# Patient Record
Sex: Male | Born: 1965
Health system: Southern US, Community
[De-identification: ages and names within clinical notes are randomized; demographics above are authoritative.]

## PROBLEM LIST (undated history)

## (undated) DIAGNOSIS — R1310 Dysphagia, unspecified: Secondary | ICD-10-CM

## (undated) DIAGNOSIS — F419 Anxiety disorder, unspecified: Secondary | ICD-10-CM

## (undated) DIAGNOSIS — K219 Gastro-esophageal reflux disease without esophagitis: Secondary | ICD-10-CM

## (undated) DIAGNOSIS — M549 Dorsalgia, unspecified: Secondary | ICD-10-CM

## (undated) DIAGNOSIS — G8929 Other chronic pain: Secondary | ICD-10-CM

## (undated) DIAGNOSIS — I10 Essential (primary) hypertension: Secondary | ICD-10-CM

## (undated) DIAGNOSIS — IMO0002 Reserved for concepts with insufficient information to code with codable children: Secondary | ICD-10-CM

## (undated) DIAGNOSIS — M542 Cervicalgia: Secondary | ICD-10-CM

## (undated) DIAGNOSIS — J45909 Unspecified asthma, uncomplicated: Secondary | ICD-10-CM

## (undated) DIAGNOSIS — M1990 Unspecified osteoarthritis, unspecified site: Secondary | ICD-10-CM

## (undated) DIAGNOSIS — E349 Endocrine disorder, unspecified: Secondary | ICD-10-CM

## (undated) DIAGNOSIS — D751 Secondary polycythemia: Secondary | ICD-10-CM

## (undated) HISTORY — PX: WRIST SURGERY: SHX841

## (undated) HISTORY — DX: Endocrine disorder, unspecified: E34.9

## (undated) HISTORY — PX: CERVICAL FUSION: SHX112

## (undated) HISTORY — DX: Secondary polycythemia: D75.1

## (undated) HISTORY — PX: TONSILLECTOMY: SUR1361

## (undated) HISTORY — PX: BACK SURGERY: SHX140

## (undated) HISTORY — PX: KNEE SURGERY: SHX244

## (undated) HISTORY — PX: ANKLE SURGERY: SHX546

## (undated) HISTORY — PX: HERNIA REPAIR: SHX51

---

## 1997-04-08 HISTORY — PX: SHOULDER SURGERY: SHX246

## 2000-08-20 ENCOUNTER — Encounter: Payer: Self-pay | Admitting: Emergency Medicine

## 2000-08-20 ENCOUNTER — Emergency Department (HOSPITAL_COMMUNITY): Admission: EM | Admit: 2000-08-20 | Discharge: 2000-08-20 | Payer: Self-pay | Admitting: Emergency Medicine

## 2000-11-17 ENCOUNTER — Encounter: Admission: RE | Admit: 2000-11-17 | Discharge: 2000-11-17 | Payer: Self-pay | Admitting: *Deleted

## 2002-05-05 ENCOUNTER — Encounter: Payer: Self-pay | Admitting: Emergency Medicine

## 2002-05-05 ENCOUNTER — Emergency Department (HOSPITAL_COMMUNITY): Admission: EM | Admit: 2002-05-05 | Discharge: 2002-05-05 | Payer: Self-pay | Admitting: Emergency Medicine

## 2003-08-30 ENCOUNTER — Ambulatory Visit (HOSPITAL_COMMUNITY): Admission: RE | Admit: 2003-08-30 | Discharge: 2003-08-30 | Payer: Self-pay | Admitting: *Deleted

## 2004-01-22 ENCOUNTER — Emergency Department (HOSPITAL_COMMUNITY): Admission: EM | Admit: 2004-01-22 | Discharge: 2004-01-22 | Payer: Self-pay | Admitting: Internal Medicine

## 2004-06-01 ENCOUNTER — Emergency Department (HOSPITAL_COMMUNITY): Admission: EM | Admit: 2004-06-01 | Discharge: 2004-06-01 | Payer: Self-pay | Admitting: *Deleted

## 2004-06-05 ENCOUNTER — Emergency Department (HOSPITAL_COMMUNITY): Admission: EM | Admit: 2004-06-05 | Discharge: 2004-06-05 | Payer: Self-pay | Admitting: Family Medicine

## 2004-06-05 ENCOUNTER — Ambulatory Visit (HOSPITAL_COMMUNITY): Admission: RE | Admit: 2004-06-05 | Discharge: 2004-06-05 | Payer: Self-pay | Admitting: Family Medicine

## 2005-03-18 ENCOUNTER — Ambulatory Visit (HOSPITAL_COMMUNITY)
Admission: RE | Admit: 2005-03-18 | Discharge: 2005-03-18 | Payer: Self-pay | Admitting: Physical Medicine & Rehabilitation

## 2005-08-15 ENCOUNTER — Ambulatory Visit (HOSPITAL_COMMUNITY): Admission: RE | Admit: 2005-08-15 | Discharge: 2005-08-16 | Payer: Self-pay | Admitting: Specialist

## 2005-11-06 ENCOUNTER — Ambulatory Visit: Admission: RE | Admit: 2005-11-06 | Discharge: 2005-11-06 | Payer: Self-pay | Admitting: Specialist

## 2005-11-12 ENCOUNTER — Inpatient Hospital Stay (HOSPITAL_COMMUNITY): Admission: RE | Admit: 2005-11-12 | Discharge: 2005-11-14 | Payer: Self-pay | Admitting: Specialist

## 2005-12-07 ENCOUNTER — Emergency Department (HOSPITAL_COMMUNITY): Admission: EM | Admit: 2005-12-07 | Discharge: 2005-12-07 | Payer: Self-pay | Admitting: Emergency Medicine

## 2005-12-11 ENCOUNTER — Observation Stay (HOSPITAL_COMMUNITY): Admission: EM | Admit: 2005-12-11 | Discharge: 2005-12-11 | Payer: Self-pay | Admitting: Emergency Medicine

## 2005-12-27 ENCOUNTER — Ambulatory Visit (HOSPITAL_COMMUNITY): Admission: RE | Admit: 2005-12-27 | Discharge: 2005-12-27 | Payer: Self-pay | Admitting: Specialist

## 2006-04-04 ENCOUNTER — Encounter: Admission: RE | Admit: 2006-04-04 | Discharge: 2006-04-04 | Payer: Self-pay | Admitting: Specialist

## 2006-05-16 ENCOUNTER — Encounter: Admission: RE | Admit: 2006-05-16 | Discharge: 2006-05-16 | Payer: Self-pay | Admitting: Otolaryngology

## 2006-05-20 ENCOUNTER — Ambulatory Visit (HOSPITAL_COMMUNITY): Admission: RE | Admit: 2006-05-20 | Discharge: 2006-05-21 | Payer: Self-pay | Admitting: Specialist

## 2006-07-07 ENCOUNTER — Ambulatory Visit (HOSPITAL_BASED_OUTPATIENT_CLINIC_OR_DEPARTMENT_OTHER): Admission: RE | Admit: 2006-07-07 | Discharge: 2006-07-07 | Payer: Self-pay | Admitting: Otolaryngology

## 2006-07-13 ENCOUNTER — Ambulatory Visit: Payer: Self-pay | Admitting: Internal Medicine

## 2007-09-09 ENCOUNTER — Encounter: Admission: RE | Admit: 2007-09-09 | Discharge: 2007-09-09 | Payer: Self-pay | Admitting: Specialist

## 2008-03-04 ENCOUNTER — Emergency Department (HOSPITAL_COMMUNITY): Admission: EM | Admit: 2008-03-04 | Discharge: 2008-03-04 | Payer: Self-pay | Admitting: Emergency Medicine

## 2008-05-15 ENCOUNTER — Emergency Department (HOSPITAL_COMMUNITY): Admission: EM | Admit: 2008-05-15 | Discharge: 2008-05-15 | Payer: Self-pay | Admitting: Emergency Medicine

## 2008-05-31 ENCOUNTER — Encounter: Admission: RE | Admit: 2008-05-31 | Discharge: 2008-05-31 | Payer: Self-pay | Admitting: Specialist

## 2008-06-30 ENCOUNTER — Inpatient Hospital Stay (HOSPITAL_COMMUNITY): Admission: RE | Admit: 2008-06-30 | Discharge: 2008-07-03 | Payer: Self-pay | Admitting: Specialist

## 2008-09-14 ENCOUNTER — Encounter: Admission: RE | Admit: 2008-09-14 | Discharge: 2008-09-14 | Payer: Self-pay | Admitting: Specialist

## 2008-09-22 ENCOUNTER — Encounter: Admission: RE | Admit: 2008-09-22 | Discharge: 2008-09-22 | Payer: Self-pay | Admitting: Specialist

## 2008-10-05 ENCOUNTER — Encounter: Admission: RE | Admit: 2008-10-05 | Discharge: 2008-10-05 | Payer: Self-pay | Admitting: Specialist

## 2008-10-18 ENCOUNTER — Emergency Department (HOSPITAL_COMMUNITY): Admission: EM | Admit: 2008-10-18 | Discharge: 2008-10-18 | Payer: Self-pay | Admitting: Family Medicine

## 2008-10-19 ENCOUNTER — Inpatient Hospital Stay (HOSPITAL_COMMUNITY): Admission: EM | Admit: 2008-10-19 | Discharge: 2008-10-19 | Payer: Self-pay | Admitting: Emergency Medicine

## 2009-01-12 ENCOUNTER — Encounter: Admission: RE | Admit: 2009-01-12 | Discharge: 2009-01-12 | Payer: Self-pay | Admitting: Specialist

## 2009-04-21 ENCOUNTER — Encounter: Admission: RE | Admit: 2009-04-21 | Discharge: 2009-04-21 | Payer: Self-pay | Admitting: Specialist

## 2009-06-14 ENCOUNTER — Encounter: Admission: RE | Admit: 2009-06-14 | Discharge: 2009-06-14 | Payer: Self-pay | Admitting: Specialist

## 2009-07-06 ENCOUNTER — Inpatient Hospital Stay (HOSPITAL_COMMUNITY): Admission: RE | Admit: 2009-07-06 | Discharge: 2009-07-09 | Payer: Self-pay | Admitting: Specialist

## 2009-09-29 ENCOUNTER — Encounter: Admission: RE | Admit: 2009-09-29 | Discharge: 2009-09-29 | Payer: Self-pay | Admitting: Family Medicine

## 2009-12-07 ENCOUNTER — Encounter: Admission: RE | Admit: 2009-12-07 | Discharge: 2009-12-07 | Payer: Self-pay | Admitting: Specialist

## 2010-03-12 ENCOUNTER — Encounter: Admission: RE | Admit: 2010-03-12 | Discharge: 2010-03-12 | Payer: Self-pay | Admitting: Specialist

## 2010-05-30 ENCOUNTER — Other Ambulatory Visit: Payer: Self-pay | Admitting: Anesthesiology

## 2010-05-30 DIAGNOSIS — M542 Cervicalgia: Secondary | ICD-10-CM

## 2010-06-05 ENCOUNTER — Ambulatory Visit
Admission: RE | Admit: 2010-06-05 | Discharge: 2010-06-05 | Disposition: A | Discharge status: Home / Self Care | Payer: BC Managed Care – PPO | Source: Ambulatory Visit | Attending: Anesthesiology | Admitting: Anesthesiology

## 2010-06-05 DIAGNOSIS — M542 Cervicalgia: Secondary | ICD-10-CM

## 2010-06-27 LAB — BASIC METABOLIC PANEL
BUN: 12 mg/dL (ref 6–23)
Creatinine, Ser: 0.88 mg/dL (ref 0.4–1.5)
GFR calc non Af Amer: >60 mL/min (ref >60)

## 2010-06-27 LAB — HEMOGLOBIN AND HEMATOCRIT, BLOOD: Hemoglobin: 13.6 g/dL (ref 13.0–17.0)

## 2010-06-29 LAB — CBC
Hemoglobin: 16.6 g/dL (ref 13.0–17.0)
MCHC: 33.8 g/dL (ref 30.0–36.0)
MCV: 89 fL (ref 78.0–100.0)
RBC: 5.52 MIL/uL (ref 4.22–5.81)
WBC: 10.3 10*3/uL (ref 4.0–10.5)

## 2010-06-29 LAB — PROTIME-INR
INR: 0.97 (ref 0.00–1.49)
Prothrombin Time: 12.8 seconds (ref 11.6–15.2)

## 2010-06-29 LAB — URINALYSIS, ROUTINE W REFLEX MICROSCOPIC
Bilirubin Urine: NEGATIVE
Glucose, UA: NEGATIVE mg/dL
Hgb urine dipstick: NEGATIVE
Ketones, ur: NEGATIVE mg/dL
Protein, ur: NEGATIVE mg/dL
Urobilinogen, UA: 1 mg/dL (ref 0.0–1.0)

## 2010-06-29 LAB — DIFFERENTIAL
Basophils Absolute: 0 10*3/uL (ref 0.0–0.1)
Basophils Relative: 0 % (ref 0–1)
Eosinophils Absolute: 0.2 10*3/uL (ref 0.0–0.7)
Eosinophils Relative: 2 % (ref 0–5)
Lymphs Abs: 2.4 10*3/uL (ref 0.7–4.0)
Neutrophils Relative %: 66 % (ref 43–77)

## 2010-06-29 LAB — COMPREHENSIVE METABOLIC PANEL
ALT: 28 U/L (ref 0–53)
CO2: 30 mEq/L (ref 19–32)
Calcium: 9.6 mg/dL (ref 8.4–10.5)
Chloride: 105 mEq/L (ref 96–112)
Creatinine, Ser: 0.82 mg/dL (ref 0.4–1.5)
GFR calc non Af Amer: >60 mL/min (ref >60)
Glucose, Bld: 94 mg/dL (ref 70–99)
Sodium: 140 mEq/L (ref 135–145)
Total Bilirubin: 0.7 mg/dL (ref 0.3–1.2)

## 2010-06-29 LAB — POCT I-STAT 7, (LYTES, BLD GAS, ICA,H+H)
Bicarbonate: 30.3 mEq/L — ABNORMAL HIGH (ref 20.0–24.0)
Hemoglobin: 11.9 g/dL — ABNORMAL LOW (ref 13.0–17.0)
O2 Saturation: 100 %
Potassium: 3.8 mEq/L (ref 3.5–5.1)
TCO2: 32 mmol/L (ref 0–100)
pCO2 arterial: 44.1 mmHg (ref 35.0–45.0)
pH, Arterial: 7.445 (ref 7.350–7.450)
pO2, Arterial: 532 mmHg — ABNORMAL HIGH (ref 80.0–100.0)

## 2010-06-29 LAB — HEMOGLOBIN AND HEMATOCRIT, BLOOD: Hemoglobin: 13.5 g/dL (ref 13.0–17.0)

## 2010-07-09 ENCOUNTER — Other Ambulatory Visit: Payer: Self-pay | Admitting: Specialist

## 2010-07-09 DIAGNOSIS — M25562 Pain in left knee: Secondary | ICD-10-CM

## 2010-07-15 LAB — CBC
Hemoglobin: 15.8 g/dL (ref 13.0–17.0)
MCHC: 32.9 g/dL (ref 30.0–36.0)
MCHC: 34.6 g/dL (ref 30.0–36.0)
MCV: 88.8 fL (ref 78.0–100.0)
Platelets: 190 10*3/uL (ref 150–400)
RBC: 5.39 MIL/uL (ref 4.22–5.81)
RDW: 13.8 % (ref 11.5–15.5)

## 2010-07-15 LAB — CK TOTAL AND CKMB (NOT AT ARMC)
CK, MB: 1.5 ng/mL (ref 0.3–4.0)
Relative Index: INVALID (ref 0.0–2.5)
Total CK: 99 U/L (ref 7–232)

## 2010-07-15 LAB — D-DIMER, QUANTITATIVE: D-Dimer, Quant: 0.25 ug/mL-FEU (ref 0.00–0.48)

## 2010-07-15 LAB — CARDIAC PANEL(CRET KIN+CKTOT+MB+TROPI)
CK, MB: 1.6 ng/mL (ref 0.3–4.0)
Relative Index: INVALID (ref 0.0–2.5)
Relative Index: INVALID (ref 0.0–2.5)
Troponin I: 0.01 ng/mL (ref 0.00–0.06)

## 2010-07-15 LAB — DIFFERENTIAL
Basophils Absolute: 0 10*3/uL (ref 0.0–0.1)
Basophils Relative: 0 % (ref 0–1)
Eosinophils Relative: 2 % (ref 0–5)
Monocytes Absolute: 0.8 10*3/uL (ref 0.1–1.0)
Neutro Abs: 7.5 10*3/uL (ref 1.7–7.7)

## 2010-07-15 LAB — COMPREHENSIVE METABOLIC PANEL
AST: 23 U/L (ref 0–37)
Albumin: 3.7 g/dL (ref 3.5–5.2)
Calcium: 9.4 mg/dL (ref 8.4–10.5)
Creatinine, Ser: 1.01 mg/dL (ref 0.4–1.5)
GFR calc Af Amer: >60 mL/min (ref >60)
GFR calc non Af Amer: >60 mL/min (ref >60)

## 2010-07-15 LAB — HEMOGLOBIN A1C: Mean Plasma Glucose: 82 mg/dL

## 2010-07-15 LAB — BASIC METABOLIC PANEL
CO2: 28 mEq/L (ref 19–32)
Calcium: 8.8 mg/dL (ref 8.4–10.5)
Creatinine, Ser: 0.93 mg/dL (ref 0.4–1.5)
GFR calc Af Amer: >60 mL/min (ref >60)
GFR calc non Af Amer: >60 mL/min (ref >60)
Sodium: 139 mEq/L (ref 135–145)

## 2010-07-15 LAB — LIPID PANEL
Cholesterol: 180 mg/dL (ref 0–200)
HDL: 44 mg/dL (ref >39)

## 2010-07-15 LAB — TSH: TSH: 2.108 u[IU]/mL (ref 0.350–4.500)

## 2010-07-15 LAB — TROPONIN I: Troponin I: 0.01 ng/mL (ref 0.00–0.06)

## 2010-07-16 ENCOUNTER — Other Ambulatory Visit: Payer: BC Managed Care – PPO

## 2010-07-19 LAB — BASIC METABOLIC PANEL
BUN: 16 mg/dL (ref 6–23)
Calcium: 8.8 mg/dL (ref 8.4–10.5)
Creatinine, Ser: 1.04 mg/dL (ref 0.4–1.5)
GFR calc non Af Amer: >60 mL/min (ref >60)
Glucose, Bld: 125 mg/dL — ABNORMAL HIGH (ref 70–99)
Sodium: 139 mEq/L (ref 135–145)

## 2010-07-19 LAB — TYPE AND SCREEN: ABO/RH(D): O NEG

## 2010-07-19 LAB — CBC
HCT: 47.7 % (ref 39.0–52.0)
Hemoglobin: 14.5 g/dL (ref 13.0–17.0)
MCHC: 33.9 g/dL (ref 30.0–36.0)
Platelets: 229 10*3/uL (ref 150–400)
RDW: 13.3 % (ref 11.5–15.5)
WBC: 9.4 10*3/uL (ref 4.0–10.5)

## 2010-07-19 LAB — DIFFERENTIAL
Basophils Absolute: 0 10*3/uL (ref 0.0–0.1)
Basophils Relative: 1 % (ref 0–1)
Eosinophils Absolute: 0 10*3/uL (ref 0.0–0.7)
Eosinophils Relative: 1 % (ref 0–5)
Monocytes Absolute: 0.6 10*3/uL (ref 0.1–1.0)
Monocytes Relative: 7 % (ref 3–12)

## 2010-07-19 LAB — COMPREHENSIVE METABOLIC PANEL
ALT: 22 U/L (ref 0–53)
AST: 25 U/L (ref 0–37)
Albumin: 4.2 g/dL (ref 3.5–5.2)
Alkaline Phosphatase: 61 U/L (ref 39–117)
BUN: 13 mg/dL (ref 6–23)
Chloride: 103 mEq/L (ref 96–112)
GFR calc Af Amer: >60 mL/min (ref >60)
Potassium: 4.2 mEq/L (ref 3.5–5.1)
Sodium: 138 mEq/L (ref 135–145)
Total Bilirubin: 0.9 mg/dL (ref 0.3–1.2)
Total Protein: 6.6 g/dL (ref 6.0–8.3)

## 2010-07-19 LAB — URINALYSIS, ROUTINE W REFLEX MICROSCOPIC
Hgb urine dipstick: NEGATIVE
Protein, ur: NEGATIVE mg/dL
Urobilinogen, UA: 0.2 mg/dL (ref 0.0–1.0)

## 2010-07-19 LAB — APTT: aPTT: 26 seconds (ref 24–37)

## 2010-07-23 ENCOUNTER — Ambulatory Visit
Admission: RE | Admit: 2010-07-23 | Discharge: 2010-07-23 | Disposition: A | Discharge status: Home / Self Care | Payer: BC Managed Care – PPO | Source: Ambulatory Visit | Attending: Specialist | Admitting: Specialist

## 2010-07-23 DIAGNOSIS — M25562 Pain in left knee: Secondary | ICD-10-CM

## 2010-07-24 LAB — URINALYSIS, ROUTINE W REFLEX MICROSCOPIC
Nitrite: NEGATIVE
Specific Gravity, Urine: 1.013 (ref 1.005–1.030)
pH: 7 (ref 5.0–8.0)

## 2010-07-24 LAB — HEPATIC FUNCTION PANEL
Indirect Bilirubin: 1 mg/dL — ABNORMAL HIGH (ref 0.3–0.9)
Total Protein: 7.1 g/dL (ref 6.0–8.3)

## 2010-07-24 LAB — COMPREHENSIVE METABOLIC PANEL
BUN: 18 mg/dL (ref 6–23)
CO2: 24 mEq/L (ref 19–32)
Chloride: 104 mEq/L (ref 96–112)
Creatinine, Ser: 0.95 mg/dL (ref 0.4–1.5)
GFR calc non Af Amer: >60 mL/min (ref >60)
Total Bilirubin: 1.4 mg/dL — ABNORMAL HIGH (ref 0.3–1.2)

## 2010-07-24 LAB — CBC
HCT: 49 % (ref 39.0–52.0)
Hemoglobin: 17.3 g/dL — ABNORMAL HIGH (ref 13.0–17.0)
MCV: 90.1 fL (ref 78.0–100.0)
RBC: 5.44 MIL/uL (ref 4.22–5.81)
WBC: 7.8 10*3/uL (ref 4.0–10.5)

## 2010-07-24 LAB — DIFFERENTIAL
Basophils Absolute: 0 10*3/uL (ref 0.0–0.1)
Basophils Relative: 1 % (ref 0–1)
Eosinophils Relative: 2 % (ref 0–5)
Lymphocytes Relative: 32 % (ref 12–46)
Neutro Abs: 4.6 10*3/uL (ref 1.7–7.7)

## 2010-07-24 LAB — LIPASE, BLOOD: Lipase: 100 U/L — ABNORMAL HIGH (ref 11–59)

## 2010-08-03 ENCOUNTER — Ambulatory Visit (HOSPITAL_COMMUNITY)
Admission: RE | Admit: 2010-08-03 | Discharge: 2010-08-03 | Disposition: A | Discharge status: Home / Self Care | Payer: BC Managed Care – PPO | Source: Ambulatory Visit | Attending: Specialist | Admitting: Specialist

## 2010-08-03 DIAGNOSIS — G5600 Carpal tunnel syndrome, unspecified upper limb: Secondary | ICD-10-CM | POA: Insufficient documentation

## 2010-08-03 DIAGNOSIS — Z01812 Encounter for preprocedural laboratory examination: Secondary | ICD-10-CM | POA: Insufficient documentation

## 2010-08-03 DIAGNOSIS — J45909 Unspecified asthma, uncomplicated: Secondary | ICD-10-CM | POA: Insufficient documentation

## 2010-08-03 DIAGNOSIS — IMO0001 Reserved for inherently not codable concepts without codable children: Secondary | ICD-10-CM | POA: Insufficient documentation

## 2010-08-03 DIAGNOSIS — K219 Gastro-esophageal reflux disease without esophagitis: Secondary | ICD-10-CM | POA: Insufficient documentation

## 2010-08-03 LAB — COMPREHENSIVE METABOLIC PANEL
ALT: 50 U/L (ref 0–53)
Albumin: 4.2 g/dL (ref 3.5–5.2)
Alkaline Phosphatase: 88 U/L (ref 39–117)
BUN: 14 mg/dL (ref 6–23)
Potassium: 4.2 mEq/L (ref 3.5–5.1)
Sodium: 139 mEq/L (ref 135–145)
Total Protein: 7.1 g/dL (ref 6.0–8.3)

## 2010-08-03 LAB — CBC
Hemoglobin: 16.5 g/dL (ref 13.0–17.0)
RBC: 5.46 MIL/uL (ref 4.22–5.81)

## 2010-08-03 LAB — SURGICAL PCR SCREEN: MRSA, PCR: NEGATIVE

## 2010-08-03 LAB — PROTIME-INR
INR: 1 (ref 0.00–1.49)
Prothrombin Time: 13.4 seconds (ref 11.6–15.2)

## 2010-08-21 NOTE — H&P (Signed)
NAME:  Jesus Brown, Jesus Brown NO.:  [id number]   MEDICAL RECORD NO.:  [id number]          PATIENT TYPE:  INP   LOCATION:                               FACILITY:  [facility]   PHYSICIAN:  Remer Macho, MD        DATE OF BIRTH:  [birth year]   DATE OF ADMISSION:  10/19/2008  DATE OF DISCHARGE:                              HISTORY & PHYSICAL   CHIEF COMPLAINT:  Chest pain.   HISTORY OF PRESENT ILLNESS:  The patient is a 45 year old Caucasian male  with known history of degenerative cervical joint disease who presented  to the emergency room with complaints of intermittent chest pain for the  last week.  Apparently, the patient's blood pressure has been elevated  in the [id number] systolic range.  The patient states that he had been  taking liquid hydrocodone for his chronic pain, and initially thought  symptoms may be secondary to that and stopped taking the medication 2  days ago.  He is currently taking Embeda on a daily basis for pain  control.  He reports no shortness of breath, palpitations, diaphoresis,  loss of consciousness, or any focal weakness of any part of the body  with these chest pain symptoms.  Symptoms are not related to exertion  and at the time of this interview, the patient is pain-free.   PAST MEDICAL HISTORY:  Significant for:  1. Degenerative disk disease at L3-L4 and L4-5 with torn annular      portion of the disk and left extraforaminal compression at L3-L4      and L4-L5.  2. GERD.  3. History of migraine headaches.  4. Asthma.  5. Right anterior iliac crest bone graft harvest site pain secondary      to osteophyte formation.   PAST SURGICAL HISTORY:  History of multiple back surgeries including a  recent procedure on June 30, 2008, with posterolateral fusion and  internal fixation from L3 to L5.   CURRENT MEDICATION:  Embeda 20/0.8 once daily.   ALLERGIES:  INDOCIN.   SOCIAL HISTORY:  The patient has no history of alcohol, tobacco, or drug  use.   Lives with family.   FAMILY HISTORY:  No history of hypertension or diabetes in the family.  Father had MI in his 56s.   REVIEW OF SYSTEMS:  An extensive review of systems was done and all  systems are negative except for the positives mentioned in the history  of present illness.   PHYSICAL EXANIMATION:  VITAL SIGNS:  The patient's pulse 73, blood  pressure 137/90, respiratory rate of 18, and temperature 98.3, O2 sats  of 97% on 2 liters nasal canula.  GENERAL:  The patient is conscious; alert and oriented to time, place,  and person; in no significant distress.  At the time of this interview,  no complaints of headache and back pain.  HEENT:  No scleral icterus.  No pallor.  Ears negative.  Poor dental  hygiene.  NECK:  Supple.  No lymphadenopathy.  No JVD.  No carotid bruits.  CHEST:  Breath sounds heard  bilaterally.  Good air entry.  No added  sounds.  CVS:  S1 and S2 plus regular.  No gallop, rub, or murmur.  ABDOMEN:  Soft and nontender.  Obese.  Bowel sounds present.  EXTREMITIES:  No cyanosis, clubbing, or edema.  NEUROLOGIC:  Cranial nerves II through XII appear grossly intact.  No  focal, motor, or sensory deficits noted on gross examination.   LABORATORY DATA:  Chest x-ray was negative.  Troponin was 0.01, CK of  99, MB 1.5.  Sodium 139, potassium 2.8, chloride 105, CO2 28,  glucose  100, BUN 21, and creatinine 0.93.  WBC 11.7, hemoglobin 15.8, hematocrit  47.9, platelet count of 211.  EKG showed normal sinus rhythm, normal PR  interval, normal QRS duration.  No acute changes.   IMPRESSION AND PLAN:  This is a case of 45 year old Caucasian male with  history of degenerative cervical joint disease and chronic pain who  presents with intermittent chest pain:  1. Atypical chest pain.  Given the recurrent symptoms that the patient      is having, we will admit the patient to telemetry for overnight      observation.  He does have some risk factors including his age and       family history of heart disease.  We will recycle enzymes and      consult Cardiology for scheduling a stress test on him.  2. Chronic pain secondary to degenerative cervical joint disease.      Continue Embeda 20/0.8 once daily.  3. Elevated blood pressure.  His blood pressure will be monitored      during his hospitalization and we will make recommendations about      whether to start medication based on how he does.  At the time of      this interview, his blood pressure is within normal limits.  4. Deep venous thrombosis/gastrointestinal prophylaxis, Protonix and      subcu Lovenox.      Remer Macho, MD  Electronically Signed     BA/MEDQ  D:  10/19/2008  T:  10/20/2008  Job:  [id number]   cc:   Jessy Oto, M.D.

## 2010-08-21 NOTE — Op Note (Signed)
NAME:  DANILO, MCGREGOR NO.:  [id number]   MEDICAL RECORD NO.:  [id number]          PATIENT TYPE:  INP   LOCATION:  5019                         FACILITY:  [facility]   PHYSICIAN:  Jessy Oto, M.D.   DATE OF BIRTH:  1966-01-01   DATE OF PROCEDURE:  06/30/2008  DATE OF DISCHARGE:                               OPERATIVE REPORT   PREOPERATIVE DIAGNOSES:  Degenerative disk disease L3-L4, L4-L5 with  torn annular portion of the disk, left foraminal and extraforaminal L3-  L4 and L4-L5.   POSTOPERATIVE DIAGNOSES:  Degenerative disk disease L3-L4, L4-L5 with  torn annular portion of the disk, left foraminal and extraforaminal L3-  L4 and L4-L5.   PROCEDURES:  Left-sided L3-L4 and left side L4-L5 transforaminal lumbar  interbody fusion using a 10-mm DePuy lordotic Concorde cage at the L3-L4  level with local bone graft and Vitoss charged with bone marrow  aspirate.  At the L4-5 level, a 12-mm lordotic DePuy Concorde cage with  local bone graft and Vitoss charged with bone marrow aspirate.  Posterior lateral fusion L3-L5 two segments using Vitoss charged with  bone marrow aspirate via the transpedicular vertebral body of L3  bilaterally.  Internal fixation from L3-L5 using Monarch pedicle screws  and rods.   SURGEON:  Jessy Oto, MD   ASSISTANT:  Phillips Hay, [facility]   ANESTHESIA:  General via oral tracheal intubation, Dr. Al Corpus.   ESTIMATED BLOOD LOSS:  500 mL Cell Saver.   RETURNED BLOOD:  150 mL Hypercrit.   DRAINS:  Hemovac x1 left lower lumbar and Foley to straight drain.  The  patient had intraoperative neuromonitoring and intraoperative use of  Cell Saver.   COMPLICATIONS:  None.   FINDINGS:  The patient found to have degenerative disk disease at L3-L4,  L4-L5 with left-sided foraminal narrowing affecting the left L3 and L4  nerve roots secondary to disk bulge protrusion into the neural foramen  on this side.   HISTORY OF PRESENT ILLNESS:  This  patient is a 45 year old male who has  been experiencing low back pain, radiation left leg over the last 3-4  years.  He has been followed conservatively and signed a consent most  recently.  Epidural steroid injections as an attempt at relieving pain,  which is persisting, and he is requiring narcotic medicines to relieve  his discomfort.  Pain is worsened with standing and walking.  He  underwent preoperative studies which demonstrate torn annular fibers of  the disk within the neural foramen on the left side at L3-L4 and L4-L5.  Epidural steroids only temporized relief of his pain.  He is brought to  the operating room to undergo TLIFs on the left-sided L3-L4, L4-L5 in  order to relieve diskogenic pain and decompress the exiting L3 and L4  nerve roots.   This patient also underwent excision of osteophytes heterotopic bone  from the right anterior lateral iliac crest bone graft harvest site at  the site of previous bone graft harvest for anterior cervical fusion  surgery in the past.   DESCRIPTION OF PROCEDURE:  In  the first portion of this procedure, the  patient underwent surgery in a supine position and a bump under the  right buttock to elevate the right iliac crest.  He had prep with  DuraPrep solution and marking of this area in the preop holding area.  Following induction of anesthesia, the area was prepped and Foley  catheter was placed.  Following prep with DuraPrep, draped in the usual  manner with the right iliac crest anteriorly and laterally.  Iodine Vi-  Drape was used.  The incision ellipsing the old incision scar  approximately 3.5 cm in length incision, then carried through the  subcutaneous layers using Metzenbaum scissors sparing carefully  spreading soft tissues down to the iliac crest bone graft harvest site.  Palpation on the side of this patient was noted the prominence indicated  to the bony osteophyte off the lateral aspect of the iliac crest and the  site of  previous bone graft harvest.  Subperiosteal dissection was  carried out laterally as well as superiorly over the iliac crest.  An  osteophytic mass was identified about 3/4 of an inch in length x 1/2  inch in depth.  This was then resected using Leksell rongeur and Baer  rongeur then used carefully to smooth the iliac crest to smooth contour.  Following resection of this mass, palpation in the area demonstrated no  further prominence or signs of prominent spur here as was noted  previously.  Irrigation was carried out over the iliac crest.  The  fascia layer of the proximal thigh was then approximated to the  abdominal fascia with interrupted #1 Vicryl sutures, deep subcu layers  were approximated with interrupted 0-Vicryl sutures, and more  superficial layers with interrupted 2-0 Vicryl sutures.  Skin closed  with a running subcu stitch of 4-0 Vicryl.  Dermabond was then applied,  4 x 4s fixed to the skin with an OpSite.  At this point then, [facility]  spine frame was brought into the room. the patient was then carefully  turned to prone position using the [facility] spine frame.  All pressure  points were well padded.  PAS stockings were used for lower extremity  during the initial first portion of this case as well as the last  portion of the case.   Arms at the side at 90 and the shoulders from the waist and axial line.  All pressure points were well-padded.  Intraoperative neuromonitoring  during this period time indicating no changes in nerve testing of either  upper or lower extremities.  He then had prep of the lumbar spine from  the lower dorsal spine to the mid sacral level with DuraPrep solution.  It was draped in the usual manner, iodine and Vi-Drape was used.  A  incision in the midline extending from about L2-L5 through the skin and  subcutaneous layer, total length of the incision about 4 inches.  The  incision carried down to the spinous process of L2, L3, L4, and L5.  An   incision then made on both sides of the spinous process, preserving the  supraspinous ligament.  Electrocautery used carefully to perform  subperiosteal dissection on both sides of spinous process of L2-L3, L4-  L5.  Cobb was used to elevate the paralumbar muscles off the posterior  aspect of the lamina L5, L4, L3, and L2.  Next, the dissection was  carried out lateral to the facets at the L4-L5 level of L3-L4 and L2-L3  level preserving facet capsules  at L2-L3, resecting the capsules at L3-  L4, L4-L5 after first identifying the levels using two clamps within the  interspinous process region of L3-L4 and L4-L5.  C-arm lateral used to  identify these levels and they were marked using a marking pen for  continued identification.  The dissection then carried out laterally  over the transverse process of L3, L4, and L5 at the L2-L3, L3-L4, and  L4-L5 facets laterally.   Viper retractor was inserted and soft tissue retraction allowed for  exposure of these lateral areas for identification for posterior lateral  fusion.  Bleeders controlled using monopolar and bipolar electrocautery.  When a complete exposure was obtained, osteotomes were used to resect  the inferior left side of the lamina of L3 at the L3-L4 level and L4 at  the L4-L5 level resecting the inferior articular process of L3 and of L4  and resecting the superior articular process of L5 and L4 by performing  total facetectomy over the left-sided neural foramen at L3-L4 and L4-L5.  Bleeders controlled using bipolar electrocautery, controlling bleeding  veins here.  Ligamentum flavum was resected superiorly, inferiorly, and  allowed to remain somewhat draped over the medial aspect of the thecal  sac.  The L4 nerve root at the L4-L5 level was carefully retracted  superiorly.  Veins within the axillary of the L4 nerve root were  carefully cauterized exposing the L4-L5 disk posterolateral region.  Similarly this was carried out at the  L3-L4 level, retracting the L3  nerve root superiorly cauterizing the axillary vein, mobilizing the  nerve root in order to resect disk within the posterolateral portion of  the disk on the left side at L3-L4.  Disk was noted to be protruding in  both instances within the neural foramen with superior articular process  of L5 and of L4 both impinging within the foramen upon the exiting nerve  roots.  These areas of superior articular process were resected using  Kerrisons.  Bleeding bone controlled using bone wax applied accordingly  to areas of the pedicle.  Gelfoam thrombin-soaked used to further obtain  hemostasis with the neural foramen on left side, L3-L4, L4-L5.  Attention turned into placement of pedicle screws beginning at the left-  sided L3.  An awl was used to make an entry point in the intersection of  the lateral aspect of the pedicle of L3 with this transverse process of  L3.  Intraoperative C-arm fluoro demonstrating position alignment of  this pedicle screw in good position alignment.  Following this, then  handheld pedicle probe was used to probe the pedicle to the depth of 40-  45 mm.  Correct degree of convergence and following this the pedicle  channel was probed using ball-tipped probe and then aspiration of  vertebral body of L3 was carried out using Vitoss aspiration equipment  much like a [employer] type needle inserted through the opening of the  pedicle and aspiration of 10 mL used to charge 10 mL strip of Vitoss.  This Vitoss was cut into matchstick type material and used for  posterolateral fusion.   Tapping of the L3 pedicle on the left side was then carried out using a  5.5 tap and then a 45-mm length x 6.25 screw was placed on the left side  of L3, obtaining excellent purchase.  Decortication of the transverse  process of L3 was carried out on the left side and Vitoss placed over  the transverse process extending towards transverse process of L4.  Awl  was then  used to make an entry point at the L4 level of the transverse  process intersection of the lateral aspects of pedicle of L4.  Then a  handheld pedicle probe was used to probe the pedicle to the depth of 45  mm.  A 40 mm x 7.0 screw was placed at this level tapping with a 6.25  tap and placing a 7.0 screw x 40 mm at this level.  Note, the tapping  was performed and ball-tipped probe was used to probe the channel  following the introduction of the pedicle finder and then the following  tapping and ensuring patency of the pedicle opening as well as ensuring  no broaching of cortex.  Convergence was used with the screws as well.  Following this, decortication was carried out in the transverse process  of L4 and the 7 mm x 40 mm screw was placed on the left side at the L4  level.  Vitoss was applied over the transverse processes decorticated to  that of the L3 transverse process.  An awl was then used to make a entry  point into the area of the intersection, the transverse process of L5  into the superior articular process of L5 and its intersection at the  lateral aspect of the pedicle.  Following the awl, then a pedicle probe  was used to probe the pedicle to a depth of about 45 mm.  A 45 mm screw  was chosen, however, 50 mm was eventually used in order to provide  enough lift of the fastener of bone surface to allow for it to be in  alignment of the screws placed at L4-L3.  So, a 50 mm x 7.0 screw was  used on the left side of L5.  Decortication of transverse process was  carried out.  Ball-tipped probe was used to probe the channel with the  pedicle demonstrating its patency and indicating that there was no  broaching of cortex within the pedicle of L5.  Screw was placed without  difficulty.  Note that, Vitoss was applied to the decorticated  transverse process of L5 extending to L4 and L3.  Attention then turned  to the right side where similarly pedicle screws were placed at L3, L4,  and  L5 again using the awl, using C-arm fluoro to verify its position  alignment, then using a pedicle probe to probe the pedicle channel of  each segment, then using ball-tipped probe to ensure patency of each of  the pedicles ensuring no broaching of cortex.  Tapping with a size  smaller.  At the L4-L3 level, a 5.5 tap was used and at L4-L5 a 0.25 tap  was used at each to tap the opening.  Ball-tipped probe again used to  probe each of the channels and then screws were placed at the L3 level,  a 6.25 screw x 45 mm in length, at the L4 level a 40 mm length x 7.0  screw was used, and at the final length on the right side at L5 a 45 mm  x 7.0 screw was placed.  Note, correct degree convergence was maintained  and each of the transverse processes were decorticated using high-speed  bur and Vitoss charged with bone marrow aspirate and obtained  transpedicular degree over the right side at L3.  It was used to charge  a 10 mL Vitoss.  A strip of this was cut into matchsticks and then  placed along the right  side from L3 to L4 to L5.  This completed the  instrumentation with pedicle screws and soft tissue resistance testing  was then undertaken.  Beginning on the left side, the left L3 measured  greater than 36, the left L4 measured at 24, the left L5 measured at 21,  the right L3 measured at greater than 36, the right L4 measured at 20,  and the right L5 measured at 31.  It was felt that these all represented  good soft tissue resistance values to be expected.  However, with those  on the left side at L4-L5 and the right at L4, being somewhat lower.  C-  arm fluoro was swung into oblique views and these demonstrated each of  the screws to be well within the pedicles at each level and showed no  evidence of extrapedicular placement of screws.  Irrigation was then  carried out.  At this point, rods were then placed within the fasteners  on each side.  A rocker was needed for the right L4 fastener to  apply  its cap.  Caps were then applied at each segment without difficulty.  Attention then turned to performance of TLIFs on the left side.  First  at the left L4-L5 level, the L4 nerve root was retracted superiorly.  The disk entered via the opening made previously.  Pituitary rongeurs  used to debride the disk and degenerative disk material and then dilatation of the disk carried out using 8, 9, 10, 11, then 12-mm  dilators.  Larger pituitaries were used to further debride the  degenerative disk from this disk space and then curettage carried out of  the endplates removing any residual disk as well as cartilaginous  endplates of the superior aspect of L5 and inferior aspect of L4 down to  bleeding cortical bone surfaces.  A straight upbiting, right upbiting,  left curettes were carried out as well as ring curettes were used.  All  of degenerative disk and cartilaginous material was removed using  pituitary rongeurs.  Trial was then carried out using 8, 9, 10, 11 and  then 12-mm trial provided best fit.  A 12-mm small lordotic cage was  chosen of Concorde variety.  This was then packed with local bone graft  harvested from previous facetectomy and inferior hemilaminectomy at left  L3 and L4 levels.  This was packed nicely within the cage.  Additional  bone graft was placed within the intervertebral disk space and any  residual Vitoss was also implanted within the intervertebral disk space.  The 12-mm cage was then positioned over the opening within the disk and  the foramen on the left-sided L4-L5 and impacted into place and  convergence to about 35-40 degrees.  Subset beneath the posterior aspect  of the disk space by about 3-4 mm.  C-arm fluoro demonstrated position  alignment of this cage in good position alignment.  Bleeding controlled  using Gelfoam and additional cottonoids.  Attention turned to left L3-L4  level.  Similarly, the disk was entered within the foramen, retracting   the L3 nerve root superiorly, excising disk using pituitary rongeurs.  Curettage carried out at the endplates using straight upbiting, right  straight upbiting, and left curettes as well as ring curettes down to  bleeding bone endplate surfaces.  Bony endplates were preserved.  The  remaining disks were resected within the disk space using pituitary  rongeurs and ring curettes were also used.  Following this, then  dilatation was carried out  using 8, 9, and 10-mm dilators.  Further  debridement was carried out until bleeding bone endplates were found to  be present.  Loupe magnification headlight was used throughout this  case.  Following this, trial was performed using 8, 9, and 10-mm trial  and 10 brought about the best fit.  A 10-mm lordotic DePuy Concorde cage  was chosen.  This was packed with local bone graft.  Additional local  bone graft was placed within the intervertebral disk space and the  residual Vitoss was placed as well.  Following this, then the patient  had the 10-mm cage inserted into the disk space through the  transforaminal approach on the left side using the impact and provided  times placed appropriately medially at both levels engaging the cortex  on either side of the disk space.  Missing time medial.  With this  impacted in, C-arm fluoro was used to ascertain correct position and  alignment of cage at the L3-L4 level.  With the placement of cages, then  it was noted that the rods that initially measured 65 mm in length were  too short, so that these were exchanged over 75 mm rods engaging each of  the fasteners caps placed at L3, L4, and L5 bilaterally.  The caps at  the L3 level were then each tightened to 80 foot-pounds.  Compression  obtained between the fasteners on the left side at L3-L4 and the  fastener at L4 tightened to 80 foot-pounds.  The fastener on the right  side at the L4 level was similarly tightened to 80 foot-pounds with  compression between the  fastener at L3 and L4 performed in order to  maintain lordosis and to compress the cage anteriorly.  Then turning to  the L4-L5 level on the left side, compression between the fasteners on  the left over the rod at L4 and L5 obtained and the fastener at the L5  level was then tightened to 80 foot-pounds.  Compression between the  fasteners on the right side at L4-L5 and the fastener at L5 tightened to  80 foot-pounds on the right side.  With this, instrumentation was  complete.  Permanent C-arm images in AP and lateral planes demonstrated  fixation and excellent position alignment.  No need for any revision  felt to be necessary.  Irrigation was carried out.  Hockey stick neural  probe carefully passed out at the L5, L4, L3 neural foramen  demonstrating no patency and no sign of further nerve compression.   Any residual epidural bleeding was controlled using bipolar  electrocautery.  Gelfoam then placed over the neural foramen and  continued to maintain hemostasis here.  The residual remaining bone  graft was then placed posterolaterally.  Decortication was carried out  of the right side facet at L3-L4 and L4-L5.  These areas were packed  with Vitoss during the first posterolateral fusion on the right side.  This too hopefully go on to fuse the facets later.  After further  irrigation, a Hemovac drain was placed in depth of the incision exiting  along the left lower lumbar spine.  Soft tissues were allowed to fall  back into the place and a devitalized muscle tissue was resected using  electrocautery and Mayo scissors.  Lumbodorsal fascia was then  approximated with the supraspinous ligament using interrupted #1 Vicryl  sutures, deep subcu layers approximated with interrupted #1 and 0-Vicryl  sutures.  More superficial layers with interrupted 2-0 Vicryl sutures  and the  skin closed with a running subcu stitch of 4-0 Vicryl.  The  patient then had Dermabond applied following 4-0 subcu  suture.  Dressing  with 4 x 4s fixed to the skin with  OpSite.  The patient was then returned to a supine position,  reactivated, extubated, and returned to the recovery room in  satisfactory condition.  Note, that a standard time-out procedure was  carried out during this procedure prior to beginning the incision over  the anterior iliac crest site.      Jessy Oto, M.D.  Electronically Signed     JEN/MEDQ  D:  06/30/2008  T:  07/01/2008  Job:  [id number]

## 2010-08-22 NOTE — Op Note (Signed)
NAME:  Jesus Brown, Jesus Brown NO.:  [id number]  MEDICAL RECORD NO.:  [id number]           PATIENT TYPE:  O  LOCATION:  SDSC                         FACILITY:  [facility]  PHYSICIAN:  Jessy Oto, M.D.   DATE OF BIRTH:  07/04/1965  DATE OF PROCEDURE:  08/03/2010 DATE OF DISCHARGE:  08/03/2010                              OPERATIVE REPORT   PREOPERATIVE DIAGNOSIS:  Moderate right carpal tunnel syndrome.  POSTOPERATIVE DIAGNOSIS:  Moderately severe right carpal tunnel syndrome with flattening of the right median nerve secondary to hypertrophic transverse carpal ligament pressure on the nerve.  PROCEDURE:  Open right carpal tunnel release (median nerve neuroplasty at the wrist).  SURGEON:  Jessy Oto, MD  ANESTHESIA:  Right forearm Bier block by Dr. Marcie Bal supplemented with local infiltration with Marcaine one-quarter percent, 10 mL.  ESTIMATED BLOOD LOSS:  Less than 20 mL.  DRAINS:  None.  COMPLICATIONS:  None.  TOTAL TOURNIQUET TIME:  At 250 mmHg, 30 minutes.  BRIEF CLINICAL HISTORY:  The patient is a 45 year old right-handed male who reportedly had been experiencing pain and numbness in the right hand for several months.  He has undergone conservative management for carpal tunnel syndrome using splinting and anti-inflammatory agents.  He has had persistent pain and discomfort, now he is developing worsening and esthesia of the right radial three fingers.  EMG nerve conduction analysis demonstrates moderate carpal tunnel syndrome involving both sensory and motor branches of the median nerve, right wrist.  After discussion, the patient does not desire to undergo carpal tunnel injection, wishes to undergo carpal tunnel release.  He was informed of the risks and benefits of the procedure.  Understands risks of infection, bleeding, risks of the nerve injury.  He is elected to undergo procedure and has signed informed consent.  DESCRIPTION OF PROCEDURE:   This patient is seen in the preoperative holding area, had marking of the right wrist with an X and my initials. Received standard preoperative antibiotics of Ancef.  He was transported to the operating room via stretcher, [place] room #15 in [facility] was used for the procedure.  There, he was placed on the OR table in supine position.  He had a tourniquet about the right forearm and an IV needle in his right dorsal hand.  He underwent the application of the right upper extremity Bier block at the level of the upper forearm by Dr. Marcie Bal, and this was done well.  Then had prep of the right hand to the level with tourniquet at the upper forearm with DuraPrep solution was draped in the usual manner.  Time-out protocol was carried out identifying the patient, procedures, side of the procedure expected blood loss, length of time, and any concerns.  This was a standard protocol.  The right upper extremity then had marking for the expected incision in line with the fourth digit incision cut at the level of the distal wrist crease of the right hand.  Approximately an inch and a half in length. Loupe magnification and headlamp was used for the procedure.  Incision through skin and subcu layers after  infiltration of the Marcaine 0.25%, no epinephrine.  Incision was made through the skin and subcu layers directly down to the transverse carpal ligament.  Palmaris longus identified as well as distal forearm fascia.  The palmaris longus retracted radial wards and the distal forearm fascia grasped with a forceps, elevated, then divided using Stevens scissors.  Stevens scissors under direct observation was then used to divide the forearm fascia from distal to proximal exposing the median nerve beneath.  This was carried up the volar aspect of the distal forearm to a point where there is no further compression of the nerve related to the thickened fascia over the distal forearm.  Next, a Publishing copy was passed, then through the open end of this incision distally beneath the transverse carpal ligament held in place and the transverse carpal ligament was then divided using a 15 blade scalpel overlying this instrument.  Next, skin was then elevated distally and both Ragnell retractor and Senn retractor were used to retract the skin edge under direct observation, then the remnants of the distal portion of the transverse carpal ligament were then divided sharply using Stevens scissors and then tenotomy scissors distally into the palmar fascia until the transverse carpal vessels were then noted.  Transverse palmar vessels were located and preserved.  At this point, then the nerve was completely decompressed.  It showed evidence of flattening associated with transverse carpal ligament pressure on the nerve.  Motor branch was intact.  Normal appearance.  Release of the tourniquet bleeders controlled using bipolar electrocautery.  Pressure until bleeding was completely dried.  Irrigation was then carried out.  Following this then, the fat that was found in subcu areas carefully localized over the nerve and subcu area and the skin was then approximated with interrupted horizontal mattress sutures of 4-0 nylon. Dressing of Adaptic, 4x4s, sterile Webril was then applied and well padded, volar splint, the wrist dorsiflexed at about 25 degrees.  The patient then was reactivated from some sedation he was undergoing at the time of procedure.  Note that at the end of the procedure, the patient did have further infiltration with Marcaine 0.25%, so total of 10 mL was used for the procedure.  The patient was then able to be reactivated and returned to his stretcher and then to the recovery room in satisfactory condition.  All instrument and sponge counts were correct.  POSTOPERATIVE CARE:  The patient had the procedure done under a Bier block as an outpatient procedure at [facility].   He was then able to be discharged via the [place] home to maintain elevation for a period of 2 days and use ice locally 2 hours on, half hour off for 24 hours.  To use morphine 10 mg tablet one every 4-6 hours if needed for pain.  He has had a good supply at home. He will be seen back in the office in 2 weeks for a followup appointment.  He is to maintain dry dressing.  Cover this area while bathing.    Jessy Oto, M.D.     JEN/MEDQ  D:  08/03/2010  T:  08/04/2010  Job:  [id number]  Electronically Signed by Basil Dess M.D. on 08/22/2010 06:03:13 PM

## 2010-08-24 NOTE — Discharge Summary (Signed)
NAME:  Jesus Brown, Jesus Brown NO.:  [id number]   MEDICAL RECORD NO.:  [id number]          PATIENT TYPE:  INP   LOCATION:  [id number]                         FACILITY:  [place]   PHYSICIAN:  Sherryl Manges, M.D.  DATE OF BIRTH:  [birth year]   DATE OF ADMISSION:  10/18/2008  DATE OF DISCHARGE:  10/19/2008                               DISCHARGE SUMMARY   DISCHARGE DIAGNOSES:  1. Chest pain.  2. History of degenerative disk disease.  3. Chronic pain syndrome.  4. Gastroesophageal reflux disease.  5. Migraines.  6. Bronchial asthma.   DISCHARGE MEDICATIONS:  Not applicable, as the patient left AMA.   PROCEDURES:  Chest x-ray done October 18, 2008.  This showed mild central  pulmonary vascular prominence.  No infiltrate, congestive heart failure,  or pneumothorax.   CONSULTATIONS:  Dr. Mare Ferrari, cardiologist.   ADMISSION HISTORY:  As in H and P notes of October 19, 2008, dictated by  Dr. Remer Macho.  However, in brief, this is a 45 year old male with  known history of degenerative joint disease at L3-L4 and L4-L5, status  post multiple back surgeries, chronic pain syndrome, gastroesophageal  reflux disease, migraine headaches, bronchial asthma, right anterior  iliac crest bone graft harvest site secondary to osteophyte formation,  status post excision, presenting with recurrent chest pain of  approximately 1-week duration, described as left sided and at least on  one occasion associated with cramping of his left hand.  No associated  palpitations, shortness of breath, or diaphoresis.  He had also noted  intermittent elevated systolic blood pressure during the above-mentioned  period of time.  He eventually presented to the emergency department,  and was admitted for further evaluation, investigation, and management.   CLINICAL COURSE:  1. Chest pain.  For details of presentation, refer to admission      history above.  Patient has no family history of early coronary      artery  disease.  His father sustained an MI in his 80s.  A 12-lead      EKG showed no acute ischemic changes.  Cardiac enzymes were cycled,      remained un-elevated.  During the course of his hospitalization, no      further episodes of chest pain were recorded.  Cardiology      consultation was called, to be provided by Dr. Mare Ferrari, since it      was felt that patient did require stratification in spite of the      atypical character of his pain.  Of note, D-dimer done on October 19, 2008, was negative at 0.25.   1. Chronic pain syndrome.  This was not problematic.  Patient was      placed on pre-admission analgesic medications.   1. History of migraines.  There were no recurrences during the course      of this hospitalization.   1. Hypertension.  As pointed out above, patient had noted intermittent      elevation of his systolic blood pressures which he stated was      between  the 150 to 160 range.  He remained normotensive however,      during the course of this hospitalization.  Anxiety may have been      contributory.   1. Bronchial asthma.  Patient was asymptomatic from this viewpoint,      during the course of his hospitalization.   1. GERD.  There were no symptoms referable to this.   DISPOSITION:  This was not feasible, as patient left AMA in the evening  of October 19, 2008, before he was seen by the cardiologist.      Sherryl Manges, M.D.  Electronically Signed     CO/MEDQ  D:  10/20/2008  T:  10/20/2008  Job:  [id number]

## 2010-08-24 NOTE — Discharge Summary (Signed)
NAME:  Jesus Brown, Jesus Brown NO.:  [id number]   MEDICAL RECORD NO.:  [id number]          PATIENT TYPE:  INP   LOCATION:  5019                         FACILITY:  [facility]   PHYSICIAN:  Jessy Oto, M.D.   DATE OF BIRTH:  1965/08/04   DATE OF ADMISSION:  06/30/2008  DATE OF DISCHARGE:  07/03/2008                               DISCHARGE SUMMARY   ADMISSION DIAGNOSES:  1. Degenerative disk disease at L3-4 and L4-5 with torn annular      portion of the disk and left foraminal and extraforaminal      compression at L3-4 and L4-5.  2. Gastroesophageal reflux disease.  3. History of migraine headaches.  4. Asthma.  5. Right anterior iliac crest bone graft harvest site pain secondary      to osteophyte formation.   DISCHARGE DIAGNOSES:  1. Degenerative disk disease at L3-4 and L4-5 with torn annular      portion of the disk and left foraminal and extraforaminal      compression at L3-4 and L4-5.  2. Gastroesophageal reflux disease.  3. History of migraine headaches.  4. Asthma.  5. Right anterior iliac crest bone graft harvest site pain secondary      to osteophyte formation.   PROCEDURE:  On June 30, 2008, the patient underwent left-sided L3-4 and  left-sided L4-5 transforaminal lumbar interbody fusion using lordotic  [place] cages with VITOSS and bone marrow aspirate, posterolateral  fusion L3 through L5 using VITOSS and bone marrow aspirate, pedicle  screws and rod instrumentation, performed by Dr. Louanne Skye, assisted by  Phillips Hay, PAC, under general anesthesia.   CONSULTATIONS:  None.   BRIEF HISTORY:  The patient is a 45 year old male with chronic and  progressive back pain and neurogenic claudication.  Epidural steroid  injections have been used at attempts in relieving his discomfort;  however, now he is requiring constant narcotic medication for relief of  his pain.  He has increasing pain with standing and walking.  Preoperative studies demonstrate torn  annular fibers of the disk within  the neural foramen on the left at L3-4 and L4-5.  It was felt he would  benefit from the above-stated procedure in order to relieve diskogenic  pain and decompress the L3 and L4 nerve roots.  The patient also was  having difficulty with discomfort over the right anterolateral iliac  crest where he had had previous bone graft harvested for a previous  anterior cervical diskectomy and fusion.  He underwent excision of these  osteophytes and heterotopic bone over the right anterolateral iliac  crest bone graft harvest site during this surgery.   BRIEF HOSPITAL COURSE:  The patient tolerated the procedure under  general anesthesia without complications.  Postoperatively,  neurovascular motor function was intact in the lower extremities.  The  patient was started on physical therapy for progressive ambulation  utilizing an [employer], which was provided by a [employer].  The patient's  diet was held until bowel function returned and then he was able to take  a regular diet after having flatus and bowel movement.  The patient was  able to void after discontinuation of his Foley catheter.  Hemovac drain  was discontinued on the first postoperative day, and his dressing was  changed daily thereafter finding the wound healing without signs of  infection.  The patient initially was treated with PCA analgesics and  gradually weaned to p.o. analgesics.  He received 150 mL of cell saver  blood intraoperatively and did not require further transfusion as his  hemoglobin and hematocrit remained stable and within normal limits  throughout the hospital stay.  Chemistry studies also were within normal  limits through the hospital stay.  Preoperative urinalysis was negative  for urinary tract infection.  The patient demonstrated ability to don  and doff the brace without difficulty and was ambulating greater than  100 feet with the physical therapist.  He was felt stable for  discharge  to home on July 03, 2008.   PLAN:  The patient was instructed to continue ambulating utilizing a  walker and his [place].  He will avoid bending, lifting, or twisting.  He will be allowed to shower, and dressing changes should be done daily  or as needed.  The patient will continue on a regular diet.  He will  follow up with Dr. Louanne Skye in 2 weeks from the date of surgery.  The  patient was given a medication reconciliation form with instructions to  continue home medications as taken prior to admission.  He was also  given prescriptions for Percocet 5/325 one to two every 4-6 hours as  needed for pain, Vistaril 50 mg 1 q.6 h. p.r.n. nausea, and MiraLax  daily.  He is advised to call the office should he have questions or  concerns prior to his return office visit.   CONDITION ON DISCHARGE:  Stable.      Epimenio Foot, P.A.      Jessy Oto, M.D.  Electronically Signed    SMV/MEDQ  D:  07/28/2008  T:  07/28/2008  Job:  [id number]

## 2010-08-24 NOTE — Op Note (Signed)
NAME:  SVANIK, GEISER NO.:  [id number]   MEDICAL RECORD NO.:  [id number]          PATIENT TYPE:  INP   LOCATION:  2550                         FACILITY:  [place]   PHYSICIAN:  Melissa Montane, M.D.       DATE OF BIRTH:  Jan 10, 1966   DATE OF PROCEDURE:  12/11/2005  DATE OF DISCHARGE:                                 OPERATIVE REPORT   SURGEON:  Melissa Montane, M.D.   PREOPERATIVE DIAGNOSIS:  Foreign body of the pharynx.   POSTOPERATIVE DIAGNOSIS:  Pharyngeal irritation.   PROCEDURE:  Direct laryngoscopy and esophagoscopy.   ANESTHESIA:  General.   ESTIMATED BLOOD LOSS:  Less than 1 cc.   INDICATIONS:  This is a 45 year old, who on Saturday had a piece of chicken  in his hypopharynx at the postcricoid region that was removed in the  endoscopy suite.  He was attempting to eat today and was eating barbeque and  feels a similar sensation, as if something is stuck towards the left side of  his laryngeal cartilage.  He had a fiberoptic exam, which did not show any  indication of a foreign body above the larynx.  He is now ready to proceed  with a direct laryngoscopy and esophagoscopy.  He was informed of the risks  and benefits of the procedure, including bleeding, infection, scarring,  esophageal perforation, change in the voice, injury to the teeth, possible  no finding of foreign body, and risk of the anesthetic.  All questions were  answered and consent was obtained.   OPERATION:  The patient was taken to the operating room and placed in supine  position.  After adequate general endotracheal tube anesthesia, was examined  with a [place] scope.  He was not hyperextended secondary his cervical  surgery.  The Jay[place] scope was used to examine the larynx and piriforms and  hypopharynx and postcricoid region, and no identified foreign body was  identified.  The esophagoscope, the cervical one, was inserted, and there  was some irritation of the posterior pharyngeal wall just at  the  cricopharyngeus region, but no foreign body; and this as passed through the  cricopharyngeus, which was fairly tight, but did open up nicely with the  scope.  It was inserted and there was no foreign body in the esophagus, and  the esophageal mucosa looked a little thickened, but no evidence of  irritation or foreign body.  The scope was removed.  The patient was then  awakened and brought to recovery in stable condition.  Counts correct.  Again, the cervical position was maintained in a neutral position, as best I  could with the esophagoscope, because of his neck surgery.  The patient's  mouth guard was removed prior to extubation, and he did well.           ______________________________  Melissa Montane, M.D.     JB/MEDQ  D:  12/11/2005  T:  12/11/2005  Job:  [id number]

## 2010-08-24 NOTE — Procedures (Signed)
NAME:  LEANNE, BUCKERT NO.:  [id number]   MEDICAL RECORD NO.:  [id number]          PATIENT TYPE:  OUT   LOCATION:  SLEEP CENTER                 [facility]ACILITY[facility]   PHYSICIAN:  Clinton D. Annamaria Boots, MD, FCCP, FACPDATE OF BIRTH:  Feb 26, 1966   DATE OF STUDY:  07/07/2006                            NOCTURNAL POLYSOMNOGRAM   REFERRING PHYSICIAN:  Ileene Hutchinson T. [facility]   REFERRING PHYSICIAN:  Ileene Hutchinson T. [facility], M.D.   DATE OF STUDY:  July 07, 2006.   INDICATION FOR STUDY:  Hypersomnia with sleep apnea.   EPWORTH SLEEPINESS SCORE:  18/24, BMI 32.1, weight 217 pounds.   HOME MEDICATIONS:  Hydrocodone, Skelaxin.   SLEEP ARCHITECTURE:  Total sleep time 318 minutes with sleep efficiency  86%.  Stage I was 4%, stage II 63%, stages III and IV 6%, REM 27% of  total sleep time.  Sleep latency 15 minutes, REM latency 78 minutes.  Awake after sleep onset 6 minutes.  Arousal index 8.5.   RESPIRATORY DATA:  Apnea/hypopnea index (AHI, RDI) 11.3 obstructive  events per hour indicating mild obstructive sleep apnea/hypopnea  syndrome.  This included 1 central apnea, 5 obstructive apneas, and 54  hypopneas.  Events were strongly positional, mostly associated with  supine sleep position.  REM AHI 2.8.  There were insufficient events to  quality for split protocol CPAP titration on this study night.   OXYGEN DATA:  Moderate to loud snoring with oxygen desaturation to a  nadir of 83%.  Mean oxygen saturation through the study was 94% on room  air.   CARDIAC DATA:  Normal sinus rhythm.   MOVEMENT-PARASOMNIA:  Occasional limb jerk, insignificant.  Bathroom x1.  Bruxism.   IMPRESSIONS-RECOMMENDATIONS:  1. Mild obstructive sleep apnea/hypopnea syndrome, AHI 11.3 per hour      with positional events mostly noted while supine.  Moderate to loud      snoring with oxygen desaturation to a nadir of 83%.  2. There were insufficient events to permit CPAP titration by split      protocol on this  study night.  3. Bruxism.      Clinton D. Annamaria Boots, MD, [facility], FACP  Diplomate, Tax adviser of Sleep Medicine  Electronically Signed     CDY/MEDQ  D:  07/13/2006 12:26:17  T:  07/13/2006 21:50:05  Job:  [id number]

## 2010-08-24 NOTE — Consult Note (Signed)
NAME:  Jesus Brown, Jesus Brown NO.:  [id number]   MEDICAL RECORD NO.:  [id number]          PATIENT TYPE:  EMS   LOCATION:  MAJO                         FACILITY:  [place]   PHYSICIAN:  Karol T. Erik Obey, M.D. DATE OF BIRTH:  12-25-1965   DATE OF CONSULTATION:  12/07/2005  DATE OF DISCHARGE:  12/07/2005                                   CONSULTATION   CHIEF COMPLAINT:  Food impaction.   HISTORY:  A 45 year old white male now 3 weeks status post a left anterior  approach cervical fusion surgery with Dr. Louanne Skye borrowed a chicken McNugget  from one of his children last evening, felt it lodge and has been and unable  to swallow anything since that time.  He has been choking but breathing  okay.  No change in voice.  He has had a little dysphagia since the spinal  fusion but nothing to this degree.  He has never had a prior food impaction.  He does not think he has any significant reflux.  Today in the endoscopy  suite, Dr. Collene Mares attempted to evaluate his pharynx and noted a food foreign  body in the hypopharynx and was elected to further instrument this for fear  of accidentally dislodge it into his airway.  ENT was called for assistance.   EXAMINATION:  This is a stocky adult white male who is groggy but  appropriate.  He is breathing comfortably through mouth and nose.  Voice is  clear without stridor or labor.  He is not coughing or clearing his throat  unusually during our interview.   IMPRESSION:  Foreign body, hypopharynx (chicken nugget food impaction).   PLAN:  I think we should take advantage of the opportunity while he is still  sedated and in the endoscopy suite to retrieve this with direct  laryngoscopy.  We discussed it with the patient and his wife.  Informed  consent was obtained.      Marikay Alar. Erik Obey, M.D.  Electronically Signed     KTW/MEDQ  D:  12/07/2005  T:  12/09/2005  Job:  [id number]   cc:   Nelwyn Salisbury, M.D.

## 2010-08-24 NOTE — Op Note (Signed)
NAME:  Jesus Brown, Jesus Brown NO.:  [id number]   MEDICAL RECORD NO.:  [id number]          PATIENT TYPE:  INP   LOCATION:  [id number]                         FACILITY:  [facility]   PHYSICIAN:  Jessy Oto, M.D.   DATE OF BIRTH:  [birth year]   DATE OF PROCEDURE:  DATE OF DISCHARGE:                                 OPERATIVE REPORT   DATE OF OPERATION:  November 12, 2005.   PREOPERATIVE DIAGNOSIS:  Left C5 and C6 nerve compression secondary to  spondylosis changes with degenerative disk disease C4-5 and C5-6.  Status  post anterior cervical diskectomy and fusion at C6-7 for right-sided  cervical radiculopathy, May of 2007.   POSTOPERATIVE DIAGNOSES:  Left C5 and C6 nerve compression secondary to  spondylosis changes with degenerative disk disease C4-5 and C5-6.  Status  post anterior cervical diskectomy and fusion at C6-7 for right-sided  cervical radiculopathy, May of 2007.  The patient underwent exploration of  fusion at C6-7 and found to have a solid fusion.   PROCEDURE:  Anterior cervical diskectomy and fusion C4-5 and C5-6 utilizing  a 7-mm tricortical iliac crest autogenous bone graft harvested through a  separate incision via the right iliac crest.  Alphatec 40-mm cervical plate  and screws; removal of plate and screws at D34-534.  Microscope used during the  procedure.   SURGEON:  Dr. Basil Dess.   ASSISTANT:  RN assist.   ANESTHESIA:  General via orotracheal intubation.   SPECIMENS:  None.   ESTIMATED BLOOD LOSS:  [id number] mL   COMPLICATIONS:  None.   DRAINS:  Patient had a Foley to straight drain.  TLS drain at 43 French with  the anterior neck to a red-top tube.   HISTORY OF PRESENT ILLNESS:  The patient is a 45 year old right-hand  dominant male, former [employer] secretary.  He has had a history of severe neck pain,  radiation to his neck and shoulders.  He underwent a recent anterior  diskectomy and fusion of C6-7 by myself in May of 2007 for right-sided  cervical  radiculopathy in the C7 distribution with findings consistent with  this.  Postoperatively he did well for a short period of time, then had  increasing pain into his neck and radiation into the opposite left arm in  the C5 distribution.  The studies have indicated spondylosis changes of both  C4-5 and C5-6.  He was initially slated to undergo foraminotomy surgery,  left C4-5.  However, patient desired to be rid of all neck and shoulder  discomfort so that I could not guarantee this with foraminotomy alone.  Anterior diskectomy and fusion surgery then was recommended and as the  patient did have degeneration of the C5-6 level, this level was to be  included in the surgical procedure.  He was informed of the risk that  adjacent segment degeneration may occur in spite of relief of discomfort  associated with degeneration of these segments.  He understands this risk  and accepts.   The patient was brought to the operating room to undergo anterior diskectomy  and fusion at C5-6 and  C4-5 with right iliac crest bone graft harvest,  harvested at previous bone graft for C6-7 level.  Explored the C6-7 level  and removed the plate and screws at D34-534.   INTRAOPERATIVE FINDINGS:  As described in postoperative diagnosis.   DESCRIPTION OF PROCEDURE:  After adequate general anesthesia, the patient  had a Foley catheter placed.  He was transferred to the operating room  table.  He was in a beach chair position, the neck in slight extension with  5-pound cervical halter traction that had balanced on the well-padded  [place] horseshoe.  All pressure points were well padded, TED hose to both  lower extremities, heels off the bed.  Arms tucked at the sides with SCDs to  prevent compression neuropathy here.  He had also wrist restraints placed in  order to obtain x-rays as he has a thick neck, to allow [place] to obtain lateral  radiographs intraoperatively.  The patient underwent the standard prep over  the  anterior neck and over the right iliac crest with DuraPrep solution,  standard preoperative antibiotics, Ancef.  Draped in the usual manner,  iodine impregnated thigh drape.   An incision over the left neck, an oblique incision along the anterior  aspect of the sternocleidomastoid in line with the anterior aspect 1 cm  anterior through the skin and subcutaneous layers, approximately 3-3.5  inches in length.  The incision was carried through skin and subcu layers  directly down to the platysmal layer.  This was incised in line with the  skin incision and spread with Metzenbaum scissors.  The old previous  incision scar at the C6-7 level was noted.  The patient had scar at all  intervals of the neck extending to the anterior cervical region across each  segment from C4-5, C5-6, and C6-7, which increased the length of time of the  dissection exposure.  Large veins were encountered below the layer of the  platysmal layer.  These were suture ligated with 2-0 silk sutures and  divided.  Additional veins and arteries were localized superiorly just below  the platysmal layer and these too were clamped with hemostats, divided and  suture ligated with 2-0 silk sutures.  The interval between the trachea and  esophagus medially and the carotid sheath and its contents laterally was  gradually developed. The omohyoid muscle was identified and carefully  dissected free from the soft tissue attachments and then divided using  Metzenbaum scissors.  Carefully, the area just distal to the plate was  evaluated and dissected free.   The plate was identified over the C6-7 level.  An incision was made with a  #15 scalpel on the lateral aspect of the patient's cervical plate on the  left side.  The interval here was dissected superiorly using a Key elevator,  elevating the prevertebral fascia and scar tissue over the anterior cervical spine at the C5-6 and C4-5 levels superiorly to the mid portion of the body   of C4.  The longus colli muscle was carefully developed and lifted off the  anterior aspect of the cervical spine at all levels from C4 to C7.  The  esophagus appeared normal.  A thermometer sensor was within the esophagus  and this was used to palpate and ensure adequacy of the esophagus throughout  the procedure.   The plate was carefully exposed at the C6-7 level, and then using the  screwdriver for removing the deformed area of the plate to allow for removal  of the locking  portion of the plate.  Each of the screws were then carefully  removed using the screwdriver provided, and the Alphatec's plate was then  lifted using a Cobra clamp without difficulty.  The anterior aspect of the  cervical spine at C6-7 was then evaluated and there was no motion present.  Patient had a solid fusion of bone graft bridging the disk space from C6 to  C7 with no motion present.  Subperiosteal dissection was carried up over the  anterior aspect of C5-6 and C6-7 levels.  This area was easily identified,  using a 15 blade scalpel to incise the disk at C5-6 just above the previous  C6-7 plate, and then the next disk space up at C4-5, incising the disk as  well removing this with pituitary rongeurs at both levels.  Then 14-mm screw  posts were inserted into the anterior aspect of vertebral body at C6 and C5  and distraction obtained across the disk space.  Anterior lip osteophytes  removed using 3-mm and 2-mm Kerrison, with the entire disk space debrided of  degenerative disk material back to the posterior lip osteophytes within the  disk space.   The left side uncovertebral joint was carefully identified.  A curette was  then used to carefully remove the posterior lip osteophytes and the  posterior longitudinal ligament along with posterior annulus.  A 1-mm and  then 2-mm Kerrison was used to resect further the posterior lip osteophytes  over the inferior-posterior aspect of C5 and over the  superior-posterior  aspect of C6.  Foraminotomy was then performed on the right side and left  side of the C6 level carrying the foraminotomy out until the C6 nerve root  was observed to be exiting anteriorly and laterally without further  compression.  Large spondylytic spurs were excised off of the nerve root,  decompressing the nerve nicely on the left side.  At the end of this, a high  speed bur was then used carefully to decorticate the end plates of 075-GRM.  The height of the intervertebral disk space was measured using sounders.  Note that the operating microscope was brought into the field after initial  dissection was carried out using loupe magnification and head lamp.  The  microscope was used for the posterior decompression and removal of posterior  lip osteophytes, inferior aspect of C5, superior aspect of C6, and  foraminotomy bilateral of C6.   An incision was made on the right iliac crest following initial tracer marking and 0.5% of 1:200,000 epinephrine.  This incised the old previous  incision scar, carried through skin and subcu layers directly down to the  iliac crest anterolaterally.  Incision was made on the iliac crest using  Bovie electrocautery.  Bleeders controlled using electrocautery.  Subperiosteal dissection carried over the superior and medial aspect of the  iliac crest, and also laterally.  [employer] placed laterally and the Cobb  used to retract medially and protect the medial soft-tissue structures.  A 7-  mm dual oscillating saw was then used to harvest bone graft just below the  old previous bone graft harvest site and posterior to the site.  Note that  the depth of the intervertebral disk space had been measured using a Cloward  depth gauge at approximately 17 mm.  A 14-15 mm graft was removed from the  crest in depth, 7 mm height.  This was carefully tapered and matched into  the intervertebral space.  Gelfoam and thrombin soak was used for hemostasis  of the end plates at 075-GRM.  With distraction present, the graft was then  inserted into the disk space and packed into place and sat just at the  anterior surface of the intervertebral disk space.  Distraction was then  removed and distraction pin removed from the C6 level.  Self-retaining  retractors and Boss retractors were then removed and re-placed at the C4-5  level of the foot of the blade beneath the medial border of the longus colli  muscle as before.   Next, screw posts were then placed at the C4 level and distraction obtained  between the C4 and C5 vertebral bodies and the disk space of C4-5.  A 15  blade scalpel was used to incise this.  Pituitary rongeur used to excise  this.  Then 3-mm Kerrison was used to remove the anterior lip osteophytes.  Debridement of disk space with pituitaries as wall as microcurettes.  The  operating room microscope was used to debride the posterior aspect of the  disk space at C4-5, again removing posterior lip osteophytes and performing  a significant left foraminotomy at the C5 level, decompressing the C5 nerve  root in its entirety and removing the uncovertebral joint on this side out  laterally until the nerve root was noted to be totally decompressed.  Several epidural veins were encountered.  These were hemostased using the  thrombin-soaked Gelfoam and releasing distraction on the disk space where  appropriate.   With decompression of the nerve on the left side, irrigation was performed.  The end-plates were then carefully decorticated using a high-speed bur.  The  height of the intervertebral disk space again measuring 7 mm.  Gelfoam used  to hemostase the disk space while bone graft was harvested again from the  right iliac crest, again using a Cobb medially and an [employer] laterally, a  7-mm dual oscillating saw blade was used to cut the crest posterior to the  previous cut.  This was then cut across its base.  The depth of  the intervertebral disk space at this level was measured at 16 mm and a depth of  13-14 mm was chosen for this graft at 7 mm height.  This height was still  judged to be too tall, so that an additional millimeter was removed, making  this a 6-mm graft.  Irrigation was performed of the intervertebral disk  space, care taken to ensure there was no bony debris within the disk space  that could be retropulsed with insertion of graft.  The graft was then  impacted into place when there was observed to be no active bleeding present  from the intervertebral disk space and left neuroforamen.  The graft was  then carefully subset 1 mm.  Screw posts were then removed at C4 and C5  levels.  Bone wax applied to bleeding screw post holes.   Careful inspection demonstrated the esophagus to be normal.  Using a high-  speed bur then the anterior lip osteophytes were all smoothed as well as the  anterior aspects of the grafts at C4-5 and C5-6.  The expected length of the  graft was then measured.  A 40-mm plate here just spanned the upper two  segments quite nicely.  The lower segment was judged not to require spanning  as the fusion appeared to be solid here.  The 40-mm plate was then carefully  attached to the C6 level on the right side through the old previous screw  hole, using a  14-mm screw.  Next, the distraction and traction was released  on the neck.  The next screw hole was placed on the left side of the C5  level, carefully aligning the plate in the patient's neck in the midline.  A  14-mm screw was then placed on the left side at C5 and then a 14-mm screw  placed on the left side at C4; then on the right side of C4, a 14-mm screw;  then on the right side of C5, a 14-mm screw; and then the right side of C6  using a 14-mm screw through the old previous screw holes.  Drilling was  performed at 14 mm reach of the other segments.   Irrigation was performed.  The plate was then deformed at each of  the dual-  screw intervals for each segment in order to lock the plate to the screws.  This completed, further irrigation was carried out.  Bone wax was applied to  the bleeding cancellous bone over the right iliac crest.  Intraoperative  radiograph was ordered during this period of time.  Right iliac crest bone  graft harvest site was closed after careful hemostasis, re-approximating the  periosteum over the iliac crest with interrupted #1 Vicryl sutures.  Deep  subcu layers approximated with interrupted #1 and 0 Vicryl sutures, more  superficial layers with interrupted 2-0 Vicryl sutures, and the skin closed  with a running subcu stitch of 4-0 Vicryl.  Tincture of Benzoin and Steri-  Strips were applied here.   The anterior neck site had a sponge placed to ensure hemostasis here.  Intraoperative radiograph demonstrated the sponge and it was removed after  the radiograph was performed.  Careful inspection was made of the esophagus after the radiograph was made, demonstrating plates and screws from C4 to C6  in excellent position, alignment of the bone grafts showing no retropulsion,  the screws appropriate length.   Following irrigation, a 10 French TLS drain was placed in the depth of the  incision, exiting over the anterior aspect of the cervical spine.  This was  sewn in place with 4-0 nylon stitch.   Next, the patient's omohyoid muscle was reapproximated with interrupted 2-0  Vicryl sutures, the platysma muscle approximated with interrupted 2-0 Vicryl  sutures.  Deep subcu layers were approximated with interrupted 2-0 Vicryl  sutures, more superficial with interrupted 3-0 Vicryl sutures, skin closed  with a running subcu stitch of 4-0 Vicryl.  Dermabond was used to [employer] the  skin edges and provide a dry incision.  The TLS drain was charged.  Then 4 x  4's were affixed to the skin with Hypafix tape and the TLS attached to the  dressing with pink tape.  Then 4 x 4's were placed  over the right iliac  crest and fixed in place with Hypafix tape.  The patient had the  [place] collar applied.  The patient was then reactivated, extubated  and returned to the recovery room in satisfactory condition.  All instrument  and sponge counts were correct.      Jessy Oto, M.D.  Electronically Signed     JEN/MEDQ  D:  11/12/2005  T:  11/13/2005  Job:  [id number]

## 2010-08-24 NOTE — Op Note (Signed)
NAME:  Jesus Brown, Jesus Brown NO.:  [id number]   MEDICAL RECORD NO.:  [id number]          PATIENT TYPE:  EMS   LOCATION:  MAJO                         FACILITY:  [facility]   PHYSICIAN:  Nelwyn Salisbury, M.D.  DATE OF BIRTH:  31-Jul-1965   DATE OF PROCEDURE:  12/07/2005  DATE OF DISCHARGE:  12/07/2005                                 OPERATIVE REPORT   PROCEDURE PERFORMED:  Attempted esophagoscopy.   ENDOSCOPIST:  Nelwyn Salisbury, M.D.   INSTRUMENT USED:  Olympus video panendoscope.   INDICATION FOR PROCEDURE:  A 45 year old white male with a history of  cervical fusion done on August 7th, undergoing an EGD for a food impaction.  Patient had a chicken nugget about 6 p.m. yesterday and has not been able to  swallow it since then.  Patient presented to the emergency room, and  therefore we were contacted to disimpact this.   PREPROCEDURE PREPARATION:  Informed consent was procured from the patient.  The patient has been in a fasting state for over 4 hours.  The risks and  benefits of the procedure including aspiration, bleeding, perforation and  even death have been discussed with the patient in great detail.   PREPROCEDURE PHYSICAL:  VITAL SIGNS:  Stable.  NECK:  Supple.  A surgical scar present at the base of the neck on the left  side.  The patient has the neck in a collar, which had been removed prior to  examination.  CHEST:  Clear to auscultation.  S1, S2 regular.  ABDOMEN:  Soft with surgical scars present in bilateral inguinal areas.   DESCRIPTION OF THE PROCEDURE:  The patient was placed in the left lateral  decubitus position, sedated with fentanyl and Versed.  Once the patient was  adequately sedated, the Olympus video panendoscope was advanced through the  mouthpiece, over the tongue, up to the vocal cord.  A nugget was seen and  impacted behind the right vocal cord and in spite of my efforts to gently  push it into the esophagus I could not do so.  I aborted  the procedure at  this point with plans to call the ENT surgeon, Dr. Erik Obey, who was  contacted to remove this piece of meat as I was unable to do so.      Nelwyn Salisbury, M.D.  Electronically Signed     JNM/MEDQ  D:  12/08/2005  T:  12/09/2005  Job:  [id number]   cc:   Jessy Oto, M.D.

## 2010-08-24 NOTE — Op Note (Signed)
NAME:  Jesus Brown, Jesus Brown NO.:  [id number]   MEDICAL RECORD NO.:  [id number]          PATIENT TYPE:  OIB   LOCATION:  5002                         FACILITY:  [place]   PHYSICIAN:  Jessy Oto, M.D.   DATE OF BIRTH:  August 03, 1965   DATE OF PROCEDURE:  08/15/2005  DATE OF DISCHARGE:  08/16/2005                                 OPERATIVE REPORT   PREOPERATIVE DIAGNOSIS:  Bilateral spondylosis C6-7 with severe degenerative  disk disease C6-7.   POSTOPERATIVE DIAGNOSIS:  Bilateral spondylosis C6-7 with severe  degenerative disk disease C6-7.   PROCEDURE:  Anterior cervical diskectomy and fusion C6-7 with right iliac  crest bone graft harvest through a separate incision 16 mm Alphatec locking  plate and 14 mm screw internal fixation C6-7.   SURGEON:  Jessy Oto, M.D.   ASSISTANT:  Susa Day, M.D.   ANESTHESIA:  GOT, Ala Dach, M.D.   ESTIMATED BLOOD LOSS:  75 mL.   COMPLICATIONS:  None.   DRAINS:  7 French TLS anterior neck.   BRIEF CLINICAL HISTORY:  This patient is a 45 year old male who has  complained of a long history of neck pain with radiation to his shoulders.  This has been worsening over the last 6 months with radiation to both arms,  weakness in the triceps with finger extension consistent with C7  distribution. He is having night pain requiring narcotic medications to  relieve his discomfort. He has failed conservative management with physical  therapy, selective nerve root blocks, all have been unsuccessful. He is  brought to the operating room to undergo a anterior cervical diskectomy and  fusion at the C6-7 level for spondylosis at this segment and this affecting  the C7 nerve roots and severe degenerative disk changes with Modic changes  on MRI study.   FINDINGS:  Degenerative disk disease C6-7, narrowing of the disk space with  significant uncovertebral spondylosis affecting both C7 nerve roots.   DESCRIPTION OF PROCEDURE:   After adequate general anesthesia, the patient in  a beach chair position, the neck in slight extension, the head on the  [place] horseshoe well padded and 5 pounds cervical halter traction bump  between the should blades of foam rubber material. The right iliac crest  elevated on rolled sheets. Standard prep with DuraPrep solution over the  right iliac crest and left anterior neck area. Draped in the usual manner,  Iodine Vi-Drape was used, prep was with DuraPrep, standard preoperative  antibiotics of Ancef. The initial incision was over the left side of the  neck a fingerbreadth above the medial clavicle as he was felt to have a  rather short stout neck. In line with the patient's skin creases, the  incision about 2-1/2 inches in length through the skin and subcutaneous  layers carried down to the platysma layer. Incision was made following  infiltration with Marcaine 0.5% with 1:200,000 epinephrine. The platysma  layer was then incised in line with the skin, a small vein found anteriorly,  this was ligated with free tie of 2-0 Vicryl and then divided. An interval  between the trachea and esophagus medially and the carotid sheath laterally  was then developed bluntly to the anterior aspect of the cervical spine  retracting the trachea and esophagus with hand held Cloward's exposing the  anterior prevertebral layer. Bipolar electrocautery used to cauterize the  prevertebral fascia along the medial border of the longus colli muscle and  teased across the midline with Art therapist. The expected C6-7 level was  identified by first feeling the carotid tuberosity and then palpating the  next disk space downward and visualizing this using loupe magnification and  head lamp for the entire initial exposure. A small spinal needle with the  sheath intact only allowing a centimeter of the needle to protrude was  inserted into the disk space at the C6-7 level, an additional one at the C5-  6  level. Intraoperative radiograph was obtained using C-arm fluoro. Under C-  arm fluoro, the needle at the C5-6 level and C6-7 levels were identified.  During the exposure, the anterior neck the iliac crest was exposed by Dr.  Tonita Cong. Incision made over the right anterior lateral iliac crest  approximately 2 to 2-1/2 inches in length through the skin and subcutaneous  layers directly down to the interval between the fascia, the proximal thigh  and the abdominal fascia. Incising in line with this onto the superior  aspect of the iliac crest and subperiosteal dissection then carried medially  and anteriorly exposing the iliac crest anterolaterally at least 3 inches  posterior to the anterior superior iliac spine. This area was packed for  later removal of graft. Returning to the neck then after identifying the C5-  6 and C6-7 levels under direct magnification and under loupe magnification  and head lamp and retracting the soft tissues carefully the C5-6 needle was  removed. Then the lower C6-7 level identified, the needle carefully removed  under direction observation. A small portion of disk excised from the  anterior aspect of the C6-7 level using a 15 blade scalpel, pituitary  rongeur used to excise further disks to identify this level for the  remainder of the case. The medial border of the Longus colli muscle then  carefully freed up using Key elevator both to the right and left sides. A  Boss McCullough retractor inserted foot of the blade beneath the medial  border of the Longus colli muscle both sides. Exposing the anterior aspect  of the cervical spine. The prevertebral anterior longitudinal ligament and  soft tissue here was carefully cauterized exposing the anterior surface of  the bone over the lower half of C6 and the upper half of C7 in the anterior  vertebral bodies. 14 mm screw posts were then placed and distraction obtained across the disk space. A 15 blade scalpel was used to  incise the  remaining annular fibers over the anterior aspect of the disk space at the  C6-7 level and then a 3 mm Kerrison used to excise the anterior lip  osteophytes over the inferior aspect of C6 and anterior superior aspect of  C7. A 2-0 FS microcurette then used to debride the disk space of  cartilaginous endplates as well as degenerative disk materials, pituitary  rongeur used to further debride this area. A cutting high speed bur  measuring about 3 mm was then used to carefully remove bony endplates back  to the posterior aspect of the disk space and posterior annulus, posterior  lip osteophytes. The bur was then changed to a diamond tip bur. This was  then used to carefully thin the posterior lip osteophytes. A curette was  then used to carefully remove the posterior osteophyte over the left  neuroforamen at the C7 level. A 1 mm Kerrison was then able to be introduced  and used to excuse the posterior lip osteophyte inferior to its most  significant projection posteriorly. The bar of bone material was then  removed from the posterior superior aspect to C7 and then bar removed from  the posterior inferior aspect of C6. Foraminotomy then performed both sides  using 1 mm and 2 mm Kerrison until the C7 nerve root noticed to be exiting  in a more anterior lateral direction. This completed the decompression of  the spinal canal on both C7 neuroforamen. Irrigation was performed, thrombin  soaked Gelfoam used to carefully hemostase the endplates both the superior  aspect of C7 and the inferior aspect of C6. Small epidural veins were  cauterized using bipolar electrocautery. The height in the intervertebral  disk space was then measured using a sounder, it sounded to 7 mm. The depth  using a Cloward depth gauge measured to 17 mm. The right iliac crest bone  graft harvest site was then carefully exposed using a Cobb medial and an  Army[place] lateral to expose the superficial portion of the  crest both medial  and lateral. Dual oscillating saw set at 7 mm were then used to incise the  crest from its superior aspect. The base of this was then divided using a  1/4 inch curved osteotome, graft measuring 15 mm in depth, a height of 7 mm.  The graft was then carefully tapered to the dimensions of the intervertebral  disk space and keyed.   The intervertebral disk space inspected, there was no further bony material  or soft tissue remaining within the disk space that could be retropulsed  with insertion of the graft. The graft was then placed over the  intervertebral disk space, impacted into place without difficulty. It was  placed right up against the anterior aspect of the disk space at the C6-7  level. The screw posts were then removed from the C6 and C7 vertebral bodies  anteriorly. Bone wax applied to the bleeding screw post holes, 5 pounds  cervical halter traction released. A high speed bur used to carefully trim the anterior lip osteophyte, smooth the anterior surface of C6 and C7 over  the inferior aspect of C6 and over the superior aspect of C7 for placement  of the plate. A 16 mm screw plate was then placed against the anterior  aspect of the disk space and a single fixation pin placed on the left side  at C7. With the traction released then, the first screw was placed on the  left side at C6 carefully aligning the plate and then drilling to 14 mm and  placing a 14 mm screw and then a second screw placed on the right side at C6  again drilling with a 14 mm drill and then the 14 mm screw placed. Retaining  pin was then removed and then screws placed on the left side at C7 drilling  with a 4.2 mm drill and then placing the 14 mm screw and then on the right  side similarly placed drilling with a 4.2 mm drill and then placing a 14 mm  screw. The plate was then deformed using the screwdriver provided carefully  turning under direct observation noting deformation of the plate  to hold the  screws in  place. The graft appeared to be in excellent position and  alignment and there was no active bleeding present. Irrigation was performed  to the neck area. Small bleeders controlled using bipolar electrocautery and  the patient had removal of the Boss McCullough retractor at this time.  Careful inspection of the esophagus demonstrated no abnormality. Also the  right iliac crest bone graft harvest side was then carefully hemostased,  bone wax applied to the bleeding cancellous bone surfaces and then Gelfoam.  The right iliac crest bone graft harvest site was then closed by  approximating the periosteum over the iliac crest with interrupted #1 Vicryl  sutures deep subcu layers approximated with interrupted #1 and #0 Vicryl,  the more superficial layers with interrupted 2-0 Vicryl suture and the skin  closed with a running subcu stitch of 4-0 Vicryl. Tincture of Benzoin and  Steri-Strips applied. The left anterior neck incision was carefully  hemostased and there was no active bleeding present. A single 7 [place] TLS  drain was placed in the depth of the wound exiting over the anterior neck in  line with the patient's skin crease. This was sewn in place with a 4-0 nylon  stitch. Intraoperative C-arm fluoro was then obtained demonstrating  placement of screws and excellent position and alignment. No evidence of  retropulsion of graft. The screws appeared to be appropriate length with no  impingement on the canal posteriorly. With this, the incision of the neck  was then closed by approximating the platysma layer with interrupted 3-0  Vicryl sutures, the deep subcu layers with interrupted 3-0 Vicryl sutures,  the more superficial layers with interrupted 3-0 Vicryl suture and the skin  closed with Dermabond. A 4 x 4 affixed to the skin with a Hypafix tape and  the patient's TLS drain was charged. The right iliac crest bone graft  harvest site was then dressed with the 4 x 4s,  fixed to the skin with Hypafix tape, [place] collar applied. The patient was then reactivated,  extubated and returned to the recovery room in satisfactory condition. All  instrument and sponge counts were correct.      Jessy Oto, M.D.  Electronically Signed     JEN/MEDQ  D:  08/15/2005  T:  08/16/2005  Job:  [id number]

## 2010-08-24 NOTE — Discharge Summary (Signed)
NAME:  Jesus Brown, Jesus Brown NO.:  [id number]   MEDICAL RECORD NO.:  [id number]          PATIENT TYPE:  INP   LOCATION:  5013                         FACILITY:  [facility]   PHYSICIAN:  Jessy Oto, M.D.   DATE OF BIRTH:  01-Mar-1966   DATE OF ADMISSION:  11/12/2005  DATE OF DISCHARGE:  11/14/2005                                 DISCHARGE SUMMARY   ADMISSION DIAGNOSES:  Left C5 and C6 nerve compression secondary to  spondylosis changes with degenerative disk disease C4-5 and C5-6.  Status  post anterior cervical diskectomy and fusion at C6-7 for right sided  cervical radiculopathy May 2007.   DIAGNOSES:  Left C5 and C6 nerve compression secondary to spondylosis  changes with degenerative disk disease C4-5 and C5-6.  Status post anterior  cervical diskectomy and fusion at C6-7 for right sided cervical  radiculopathy May 2007.   PROCEDURE:  On November 12, 2005, the patient underwent anterior cervical  diskectomy and fusion C4-5 and C5-6 utilizing a 7-mm tricortical iliac crest  autogenous bone graft harvested through a separate incision via the right  iliac crest.  Plates and screws utilized.  Microscope was used during the  procedure.  Exploration of fusion at C6-7 was performed, and the patient  found to have solid fusion at this level.  This was performed by Dr. Louanne Skye  under general anesthesia.   CONSULTATIONS:  None.   BRIEF HISTORY:  The patient is a 45 year old right-hand dominant male with  history of severe neck pain, radiation into his neck and shoulders.  He is  status post ACDF C6-7 in May 2007 for right-sided cervical radiculopathy in  the C7 distribution.  Postoperatively, he did well, but several months  following surgery had increasing pain into the neck with radiation into the  opposite arm on the left, and the C5 distribution.  Studies indicated  spondylosis changes at C4-5 and C5-6.  It was felt that he would benefit  from further surgical intervention  to the cervical spine and was admitted to  undergo anterior cervical diskectomy and fusion at the C4-5 and C5-6 level.   BRIEF HOSPITAL COURSE:  The patient tolerated the procedure under general  anesthesia without complications.  Postoperatively, the patient utilized PCA  analgesics initially and was weaned to p.o. analgesics without difficulty.  His drain of the anterior neck was removed on the first postoperative day.  Dressing changes were done daily, and patient was without drainage at his  hip wound or his neck wound.  He was able to ambulate in the hallway.  Neurovascular motor function of the upper extremities were noted to be  intact.  The patient was voiding without difficulty.  He was afebrile with  vital signs stable and was ready for discharge on November 14, 2005.   LABORATORY VALUES:  Admission hemoglobin and hematocrit 15.4 an 44.7  respectively.  Chemistry study was within normal limits, with exception of  glucose 173.  No chest x-ray.  EKG on the chart at the time of this  dictation.   PLAN:  The patient was given  prescriptions for Percocet 10/325, 1-2 every 4  to 6 hours as needed for pain, Klonopin 0.5 mg, 1 p.o. q.h.s.  He was  encouraged to keep his dressing dry and clean at all times and wear his  [place] collar at all times.  He is to avoid lifting greater than 10 pounds  for 6 weeks.  He will avoid overhead use of his arms.  He may ambulate as  tolerated, getting up to 1 mile per day, starting 2 weeks after surgery.  He  will  continue on clear liquids, until he had no longer has a sore throat and then  may advance his diet slowly.  The patient was instructed to follow up with  Dr. Louanne Skye in 2 weeks and was given a number to call for the appointment.   CONDITION ON DISCHARGE:  Stable.      Epimenio Foot, P.A.      Jessy Oto, M.D.  Electronically Signed    SMV/MEDQ  D:  02/03/2006  T:  02/03/2006  Job:  [id number]

## 2010-08-24 NOTE — Op Note (Signed)
NAME:  Jesus Brown, Jesus Brown NO.:  [id number]   MEDICAL RECORD NO.:  [id number]                   PATIENT TYPE:  AMB   LOCATION:  ENDO                                 FACILITY:  [facility]   PHYSICIAN:  Waverly Ferrari, M.D.                 DATE OF BIRTH:  18-Jul-1965   DATE OF PROCEDURE:  08/30/2003  DATE OF DISCHARGE:                                 OPERATIVE REPORT   PROCEDURE:  Colonoscopy.   INDICATIONS:  Rectal bleeding.   ANESTHESIA:  Demerol 100 mg, Versed 7.5 mg.   PROCEDURE:  With the patient mildly sedated in the left lateral decubitus  position, a rectal exam was performed which was unremarkable.  Subsequently  the Olympus videoscopic colonoscope was inserted into the rectum and passed  under direct vision to the cecum, identified by ileocecal valve and  appendiceal orifice, both of which were photographed.  From this point the  colonoscope was slowly withdrawn taking circumferential views of the colonic  mucosa, stopping only in the rectum, which appeared normal on direct and  showed hemorrhoids on retroflexed view, and pulled through the anal canal.  The endoscope was withdrawn.  The patient's vital signs and pulse oximetry  remained stable.  The patient tolerated the procedure well without apparent  complications.   FINDINGS:  Internal hemorrhoids, otherwise an unremarkable colonoscopic  examination to the cecum.   PLAN:  Give symptomatic treatment for hemorrhoids and have the patient  follow up with me as needed and with Dr. __________ as needed.                                               Waverly Ferrari, M.D.    GMO/MEDQ  D:  08/30/2003  T:  08/31/2003  Job:  [id number]

## 2010-08-24 NOTE — Op Note (Signed)
NAME:  Jesus Brown, Jesus Brown NO.:  [id number]   MEDICAL RECORD NO.:  [id number]          PATIENT TYPE:  EMS   LOCATION:  MAJO                         FACILITY:  [facility]   PHYSICIAN:  Karol T. Erik Obey, M.D. DATE OF BIRTH:  Jul 27, 1965   DATE OF PROCEDURE:  DATE OF DISCHARGE:  12/07/2005                                 OPERATIVE REPORT   PREOPERATIVE DIAGNOSIS:  Hypopharyngeal foreign body (meat impaction).   POSTOPERATIVE DIAGNOSIS:  Hypopharyngeal foreign body (meat impaction).   PROCEDURE PERFORMED:  Direct laryngoscopy with removal foreign body.   SURGEON:  Marikay Alar. Erik Obey, M.D.   ANESTHESIA:  IV fentanyl and Versed.   BLOOD LOSS:  Minimal.   COMPLICATIONS:  None.   FINDINGS:  A roughly 2-cm soft pale yellow mass consistent with a food  impaction (chicken nugget), completely removed.  I did not have the  opportunity to fully evaluate larynx or hypopharynx secondary to the  patient's discomfort and motion.  He does have a well-healed vertical left  low neck scar consistent with cervical fusion.   PROCEDURE:  With the patient in a supine position in the endoscopy suite,  Cetacaine spray was placed in the pharynx x2.  IV fentanyl, 25 mcg x3  repetitions was administered while observing carefully for his level of  sedation.  Upon achieving what seemed to be an adequate level, a rubber  tooth guard was placed.  Attention was paid to not flexing or extending his  neck excessively and to caution with regards to his teeth as he has a left  upper central incisor crown.   The direct laryngoscope was introduced and passed into the pharynx.  He did  some gagging and struggling with this.  I was able to get underneath his  epiglottis.  With inspection in the hypopharynx, the foreign body was  identified.  With fair tolerance on the part of the patient, straight and up  cut forceps were used to pick at the mass which was removed piecemeal and  finally the largest portion  was removed up into the lumen of the  laryngoscope and the scope was removed with the foreign body present inside  the scope and discharged.  The scope was placed one more time for inspection  and no additional food foreign body was identified.  At this point the  procedure was completed.  The tooth guard was removed.  The dental status  was intact.  The patient tolerated this reasonably well with no time any  emesis or difficulty with respiration.  Oxygen saturation was good  throughout.   COMMENT:  45 year old white male swallowed a piece of chicken McNugget last  evening and has been unable to swallow subsequently.  Coughing but no  breathing difficulty, no change in voice and flexible esophagoscopy, a  foreign body was noticed in the pharynx.  Dr. Collene Mares called for assistance at  this point and we were as above,  successful in removing this with direct laryngoscopy.  I will have him check  back in my office in 2 or  3 weeks to make sure  that there is no problem  with reflux or additional swallowing issues.  I discussed this with the  patient and his wife.      Marikay Alar. Erik Obey, M.D.  Electronically Signed     KTW/MEDQ  D:  12/07/2005  T:  12/09/2005  Job:  [id number]   cc:   Nelwyn Salisbury, M.D.

## 2010-08-24 NOTE — Op Note (Signed)
NAME:  Jesus Brown, Jesus Brown NO.:  [id number]   MEDICAL RECORD NO.:  [id number]          PATIENT TYPE:  OIB   LOCATION:  5033                         FACILITY:  [facility]   PHYSICIAN:  Jessy Oto, M.D.   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   SURGEON:  Jessy Oto, M.D.   INDICATIONS FOR PROCEDURE:  The patient has had problems with dysphagia  following his surgery and elects to undergo removal of plate and screws  at an earlier timeframe than the usual 1 1/2 or 2 years following  anterior diskectomy and fusion; however, followup studies at 3 to 4  months out demonstrated a problem with delayed union and nonunion of the  anterior diskectomy and fusion at the C5-6 level, the central segment of  a 3-level cervical diskectomy and fusion.  He is brought to the  operating room to undergo posterior cervical fusion at the C5-6 level  with a triple-wire technique in order to gain a solid fusion so that  hardware can be removed  anteriorly at an earlier date.   INTRAOPERATIVE FINDINGS:  Basically, normal-looking posterior aspect  cervical spine at C5-6 and C6-7, with adequate posterior spinous  processes to allow for interspinous process wiring and triple-wire  technique to hold bone graft to the posterior elements.   DESCRIPTION OF PROCEDURE:  After adequate general anesthesia with the  patient in a prone position on chest rolls and a Mayfield horseshoe,  well padded, eyes free of pressure, no Foley catheter, standard  preoperative  antibiotics, standard prep with DuraPrep solution over the  posterior neck and over the right iliac crest.  The shoulders were  elevated using tape and ABDs to protect the skin.   TED hose to prevent DVT.  Following prep, he was draped in the usual  manner.  An incision over the posterior aspect of the neck in the area  of the drop between the C6 and C5 spinous processes was noted.  The  incision was approximately 4 to 6 cm in length in the midline, through  the skin and subcutaneous layers, carried down to the ligamentum nuchae  layer, and then incised on both sides of the expected spinous processes  of C5 and C6.  Clamps were then placed on the spinous processes, a towel  clip at the C5 level and an Allis clamp at C6.  Intraoperative lateral  radiograph x2, the second radiograph demonstrating the towel clip at the  C5 level and the Allis clamp at the C6 level.  Carefully, using  electrocautery, then, soft tissues were carefully debrided away from the  posterior aspect of the laminae of C5 and  C6 bilaterally, and a  McCullough retractor was inserted.  Bleeding was controlled using  electrocautery.  With the [facility] retractor inserted, note that a  single stitch was used to mark the C5 segment for continued  identification throughout the procedure.  An 18-gauge wire was then  passed beneath the spinous process of C5 and over the top using both  leads, and then beneath the spinous process of C6, and then twisted on  the right side in order to perform interspinous process wiring.  With  this completed, 2 bur holes were then placed on either side of the base  of the spinous process and a towel clip and then a Lewen clamp used to  carefully connect the holes for placement of 22-gauge wires for the bone  graft procedure.  A bur was used to decorticated the posterior aspect of  the laminae of C5 and C6.  Bone graft was then harvested from the right  posterior superior iliac crest region  using a 4 to 5-cm incision through  the skin and subcutaneous layers.  It was carried down to the iliac  crest.  Subperiosteal dissection was then used to carefully elevate the  gluteal muscles off of the posterior superior iliac crest region, and  then half-inch osteotomes were used to then demarcate a line along the  rim of the iliac crest outer table.  A straight osteotome was then used  to perform straight cuts both superiorly and inferiorly in the crossing  space, removing a window of bone graft approximately 4 cm x 4 cm.  This  was then divided in half using Mayo scissors and debrided of cancellous  bone from its deeper aspect for additional use in the bone grafting  procedure.  Drill holes were then placed through the grafts after  dividing such in order to pass the wires and we then placed 22-gauge  wires.  Cancellous bone graft was then placed between the laminae,  extending from the C5 to C6, both sides, an then the grafts  carefully  placed through the holes, passed down to the posterior aspect of the  laminae, with the cancellous portion against the posterior aspect of the  patient's laminae.  The 22-gauge wires on each side were then carefully  twisted down to and holding the graft into place quite nicely.  This  ended the bone graft and fusion procedure.  Irrigation was performed and  the ligamentum nuchae layer was then approximated with interrupted 0  Ethibond sutures, deep subcu layers with interrupted 0 Vicryl sutures,  more superficial layers with interrupted 2-0 Vicryl sutures, and the  skin closed with interrupted 2-0 Vicryl sutures in inverted fashion and  interrupted 4-0 Vicryl sutures.  Dermabond was applied.  The right iliac  crest bone graft harvest site was carefully irrigated and infiltrated  with Marcaine 0.5% with 1:200,000 epinephrine.  The superficial fascial  layer overlying the patient's gluteal muscle was approximated to the area over the crest with  interrupted #1 Vicryl sutures.  The deep subcu  layers were approximated with interrupted #1 and 0 Vicryl sutures, and  the more superficial subcu layers with interrupted 2-0 Vicryl sutures,  and the skin closed with a running subcu stitch of 4-0 Vicryl.  The  patient then had Dermabond applied.  Dressings of 4 x 4's were fixed to  the skin with Hyperfix tape over the right iliac crest and over the  posterior aspect of the neck.  The patient was then placed into  a soft  cervical collar, and he was returned to a supine position, reactivated,  extubated and returned to the recovery room in satisfactory condition.  All instrument and sponge counts were correct.      Jessy Oto, M.D.  Electronically Signed     JEN/MEDQ  D:  05/20/2006  T:  05/20/2006  Job:  [id number]

## 2010-08-31 ENCOUNTER — Ambulatory Visit (HOSPITAL_COMMUNITY)
Admission: RE | Admit: 2010-08-31 | Discharge: 2010-08-31 | Disposition: A | Discharge status: Home / Self Care | Payer: BC Managed Care – PPO | Source: Ambulatory Visit | Attending: Specialist | Admitting: Specialist

## 2010-08-31 DIAGNOSIS — G5600 Carpal tunnel syndrome, unspecified upper limb: Secondary | ICD-10-CM | POA: Insufficient documentation

## 2010-08-31 DIAGNOSIS — G8929 Other chronic pain: Secondary | ICD-10-CM | POA: Insufficient documentation

## 2010-08-31 DIAGNOSIS — K219 Gastro-esophageal reflux disease without esophagitis: Secondary | ICD-10-CM | POA: Insufficient documentation

## 2010-08-31 DIAGNOSIS — J45909 Unspecified asthma, uncomplicated: Secondary | ICD-10-CM | POA: Insufficient documentation

## 2010-08-31 LAB — COMPREHENSIVE METABOLIC PANEL
ALT: 78 U/L — ABNORMAL HIGH (ref 0–53)
AST: 40 U/L — ABNORMAL HIGH (ref 0–37)
Albumin: 4.3 g/dL (ref 3.5–5.2)
Alkaline Phosphatase: 103 U/L (ref 39–117)
BUN: 20 mg/dL (ref 6–23)
Chloride: 103 mEq/L (ref 96–112)
GFR calc non Af Amer: >60 mL/min (ref >60)
Potassium: 4.1 mEq/L (ref 3.5–5.1)
Sodium: 139 mEq/L (ref 135–145)
Total Bilirubin: 0.7 mg/dL (ref 0.3–1.2)

## 2010-08-31 LAB — CBC
MCV: 83.8 fL (ref 78.0–100.0)
Platelets: 237 10*3/uL (ref 150–400)
RBC: 5.38 MIL/uL (ref 4.22–5.81)
WBC: 8.8 10*3/uL (ref 4.0–10.5)

## 2010-08-31 LAB — PROTIME-INR: INR: 1 (ref 0.00–1.49)

## 2010-09-14 NOTE — Op Note (Signed)
NAME:  Jesus Brown, MOTHERSHEAD NO.:  [id number]  MEDICAL RECORD NO.:  [id number]           PATIENT TYPE:  O  LOCATION:  SDSC                         FACILITY:  [facility]  PHYSICIAN:  Jessy Oto, M.D.   DATE OF BIRTH:  1966-04-08  DATE OF PROCEDURE:  08/31/2010 DATE OF DISCHARGE:  08/31/2010                              OPERATIVE REPORT   PREOPERATIVE DIAGNOSIS:  Left moderately severe carpal tunnel syndrome.  POSTOPERATIVE DIAGNOSIS:  Left moderately severe carpal tunnel syndrome.  PROCEDURE:  Left open carpal tunnel release.  SURGEON:  Jessy Oto, MD  ASSISTANT:  None.  ANESTHESIA:  IV regional Bier block, Dr. Chriss Driver.  FINDINGS:  Thickened transverse carpal ligament compressing the median nerve at the carpal canal.  TOTAL TOURNIQUET TIME:  250 mmHg at left arm, 33 minutes.  ESTIMATED BLOOD LOSS:  Less than 5 mL.  COMPLICATIONS:  None.  The patient returned to PACU in good condition.  HISTORY OF PRESENT ILLNESS:  A 45 year old male who has undergone 3- level anterior cervical diskectomy and fusion for degenerative disk changes and spondylosis with persistent neck and arm pain.  The patient has had experienced numbness over the last year, this worsened.  He has had history of previous carpal tunnel syndrome, previous steroid injections, and he has failed conservative management with use of splints over period of 2-4 months.  He underwent previous right open carpal tunnel release with excellent relief of pain, nearly 2 months ago.  He returns today for left open carpal tunnel release.  EMGs and nerve conduction studies demonstrated moderately severe carpal tunnel findings with changes occurring at both axonal as well as myelin changes in the sensory and motor branches of the left nerve.  The patient understands risks and benefits of the procedures.  He elects to undergo carpal tunnel release and signed informed consent.  Intraoperative findings as  above.  DESCRIPTION OF PROCEDURE:  This patient is seen in preoperative holding area, marking of left volar wrist with an X in my initials for identification throughout the case.  Standard preoperative antibiotics, Ancef and he is transported to the OR, OR room #15 at [facility] used for this procedure.  IV left hand was installed in the preoperative holding area.  In the operating room, he was then transferred to the OR table with left arm board, supine position.  The Bier block was then installed in left arm and tourniquet inflated in the left arm.  After good anesthetic effect, the left upper extremity was then prepped from fingertips to the upper arm with DuraPrep solution, draped in the usual manner.  Standard time-out protocol was carried out identifying the patient, site of the procedure, expected length of time, and estimated blood loss.  A standard carpal tunnel open incision was drawn out in line with the fourth digit with the volar aspect of the wrist with curved incision across the wrist creases, to prevent later scarring.  After the time-out protocol was carried out, the skin was infiltrated in the area, expected skin incision, over an inch length to inch and half.  This was  extended distally and proximally in line with the expected release of carpal and the superficial fascia and palmar fascia in these areas.  Skin incision was then made.  This was carried down through the fatty layers to the transverse carpal ligament distally into the palmar fascia of the distal forearm proximally.  This again in line with the fourth digit.  Palmar fascia was then grasped with Adson forceps, lifted, and divided using the [facility].  Ragnell retractor was used to elevate the proximal skin edge and then with loupe magnification and headlamp, the palmar fascia was then divided proximally over an area of about 4 cm into the proximal forearm removing any compression associated  with the palmar fascia here.  Next, the transverse carpal ligament was identified and a Soil scientist was then passed beneath the transverse carpal ligament in line with the fourth metacarpal and in line with the fourth digit.  Then using a 15-blade scalpel, the transverse carpal ligament was divided over the Soil scientist protecting the median nerve.  This was done incising very thickened median nerve.  Initially passing the [facility] elevator was difficult but finally able to be passed with the wrist slightly flexed. Next, the distal skin fold was then carefully lifted with double ended retractor and again using loupe medication and headlamp, the palmar fascia was then divided from proximal to distal in line with the fourth digit.  Small amount of muscle representing extra muscle belly to the thumb was divided and as was deeper palmar fascial layer until the superficial palmar arch was identified.  At this point, the carpal tunnel had been completely released median nerve demonstrated a fairly thinned appearance in the area of the transverse carpal ligament, but no significant narrowing noted.  Motor branch noted to be intact to the thenar eminence.  Irrigation was then carried out.  The skin was then reapproximated and closed with a single layer of 4-0 nylon suture in simple fashion and the flap stitch at the very tip of the curved incision.  Total of 4 sutures placed.  Adaptic, 4x4s fixed to the skin with sterile Webril and well- padded volar splint applied with the wrist in dorsiflexion of 20-25 degrees.  Tourniquet was then released.  Total tourniquet time was 33 minutes.  The patient was then reactivated and returned to recovery room in satisfactory condition.  All instrument and sponge counts were correct.  POSTOPERATIVE CARE:  The patient will maintain elevation of the heart to the wrist for period of 2 days, use ice locally in the wrist for 2 hours on and half hour off for 24  hours.  He is to see me back in the office in 2 weeks for removal of sutures, will be maintained in the volar splint for a total of 3 weeks, range of motion is encouraged with fingers during this period of time.  Dressings is to remain dry.  For discomfort, the patient will be given an additional prescription for hydrocodone to be taken in addition to his regular medication and morphine sulfate.     Jessy Oto, M.D.     JEN/MEDQ  D:  08/31/2010  T:  09/01/2010  Job:  [id number]  Electronically Signed by Basil Dess M.D. on 09/14/2010 07:55:28 PM

## 2010-12-19 ENCOUNTER — Other Ambulatory Visit: Payer: Self-pay

## 2010-12-19 ENCOUNTER — Emergency Department (HOSPITAL_COMMUNITY): Payer: Non-veteran care

## 2010-12-19 ENCOUNTER — Observation Stay (HOSPITAL_COMMUNITY)
Admission: EM | Admit: 2010-12-19 | Discharge: 2010-12-21 | Disposition: A | Discharge status: Home / Self Care | Payer: Non-veteran care | Attending: Family Medicine | Admitting: Family Medicine

## 2010-12-19 DIAGNOSIS — R51 Headache: Secondary | ICD-10-CM | POA: Insufficient documentation

## 2010-12-19 DIAGNOSIS — G894 Chronic pain syndrome: Secondary | ICD-10-CM | POA: Insufficient documentation

## 2010-12-19 DIAGNOSIS — I10 Essential (primary) hypertension: Principal | ICD-10-CM | POA: Insufficient documentation

## 2010-12-19 DIAGNOSIS — R079 Chest pain, unspecified: Secondary | ICD-10-CM | POA: Insufficient documentation

## 2010-12-19 DIAGNOSIS — J45909 Unspecified asthma, uncomplicated: Secondary | ICD-10-CM | POA: Insufficient documentation

## 2010-12-19 DIAGNOSIS — I517 Cardiomegaly: Secondary | ICD-10-CM | POA: Insufficient documentation

## 2010-12-19 DIAGNOSIS — K219 Gastro-esophageal reflux disease without esophagitis: Secondary | ICD-10-CM | POA: Insufficient documentation

## 2010-12-19 LAB — HEPATIC FUNCTION PANEL
AST: 28 U/L (ref 0–37)
Bilirubin, Direct: <0.1 mg/dL (ref 0.0–0.3)

## 2010-12-19 LAB — CBC
HCT: 50.1 % (ref 39.0–52.0)
Hemoglobin: 18.3 g/dL — ABNORMAL HIGH (ref 13.0–17.0)
MCV: 82.1 fL (ref 78.0–100.0)
Platelets: 214 10*3/uL (ref 150–400)
RBC: 6.1 MIL/uL — ABNORMAL HIGH (ref 4.22–5.81)
WBC: 12.4 10*3/uL — ABNORMAL HIGH (ref 4.0–10.5)

## 2010-12-19 LAB — BASIC METABOLIC PANEL
CO2: 26 mEq/L (ref 19–32)
Chloride: 103 mEq/L (ref 96–112)
GFR calc non Af Amer: >60 mL/min (ref >60)
Glucose, Bld: 98 mg/dL (ref 70–99)
Potassium: 3.9 mEq/L (ref 3.5–5.1)
Sodium: 139 mEq/L (ref 135–145)

## 2010-12-19 LAB — POCT I-STAT TROPONIN I: Troponin i, poc: 0 ng/mL (ref 0.00–0.08)

## 2010-12-19 LAB — DIFFERENTIAL
Lymphocytes Relative: 32 % (ref 12–46)
Lymphs Abs: 4 10*3/uL (ref 0.7–4.0)
Neutro Abs: 7.2 10*3/uL (ref 1.7–7.7)
Neutrophils Relative %: 58 % (ref 43–77)

## 2010-12-19 LAB — PROTIME-INR: INR: 1 (ref 0.00–1.49)

## 2010-12-19 LAB — APTT: aPTT: 31 seconds (ref 24–37)

## 2010-12-20 LAB — COMPREHENSIVE METABOLIC PANEL
ALT: 31 U/L (ref 0–53)
Albumin: 3.9 g/dL (ref 3.5–5.2)
Alkaline Phosphatase: 93 U/L (ref 39–117)
Glucose, Bld: 109 mg/dL — ABNORMAL HIGH (ref 70–99)
Potassium: 3.9 mEq/L (ref 3.5–5.1)
Sodium: 137 mEq/L (ref 135–145)
Total Protein: 7.1 g/dL (ref 6.0–8.3)

## 2010-12-20 LAB — CARDIAC PANEL(CRET KIN+CKTOT+MB+TROPI)
CK, MB: 4.7 ng/mL — ABNORMAL HIGH (ref 0.3–4.0)
Relative Index: 3.9 — ABNORMAL HIGH (ref 0.0–2.5)
Relative Index: 3.4 — ABNORMAL HIGH (ref 0.0–2.5)
Total CK: 130 U/L (ref 7–232)
Total CK: 117 U/L (ref 7–232)
Total CK: 117 U/L (ref 7–232)
Troponin I: <0.3 ng/mL (ref <0.30)
Troponin I: <0.3 ng/mL (ref <0.30)

## 2010-12-20 LAB — MAGNESIUM: Magnesium: 2 mg/dL (ref 1.5–2.5)

## 2010-12-20 LAB — CBC
MCV: 82.1 fL (ref 78.0–100.0)
Platelets: 229 10*3/uL (ref 150–400)
RBC: 6.33 MIL/uL — ABNORMAL HIGH (ref 4.22–5.81)
WBC: 12.3 10*3/uL — ABNORMAL HIGH (ref 4.0–10.5)

## 2010-12-20 LAB — TSH: TSH: 0.605 u[IU]/mL (ref 0.350–4.500)

## 2010-12-20 LAB — LIPID PANEL
Total CHOL/HDL Ratio: 5.4 RATIO
VLDL: 32 mg/dL (ref 0–40)

## 2010-12-20 LAB — PROTIME-INR: Prothrombin Time: 13.2 seconds (ref 11.6–15.2)

## 2011-01-14 NOTE — Discharge Summary (Signed)
NAMEJOBAN, BHUIYAN NO.:  [id number]  MEDICAL RECORD NO.:  [id number]  LOCATION:  [id number]                         FACILITY:  [facility]  PHYSICIAN:  Eleonore Chiquito, MD         DATE OF BIRTH:  Aug 29, 1965  DATE OF ADMISSION:  12/19/2010 DATE OF DISCHARGE:  12/21/2010                              DISCHARGE SUMMARY   ADMISSION DIAGNOSES: 1. Chest pain, rule out myocardial infarction. 2. Gastroesophageal reflux disease. 3. Headache. 4. History of asthma. 5. Degenerative disk disease. 6. Psoriatic arthritis.  DISCHARGE DIAGNOSES: 1. Hypertensive urgency, resolved. 2. Hypertension. 3. Gastroesophageal reflux disease. 4. Moderate left ventricular hypertrophy as per echo. 5. Chronic pain syndrome. 6. Chronic asthma.  TESTS PERFORMED DURING THE HOSPITAL STAY:  Chest x-ray on December 19, 2010, showed no active disease.  CT of the head without contrast showed normal head CT.  PERTINENT LABS:  Cardiac enzymes x3 negative.  Cholesterol 199, HDL 35, LDL of 132.  Echocardiogram done shows moderate LVH with EF of 55% to 60%.  BRIEF HISTORY AND PHYSICAL:  This is a 45 year old male with a history of chronic pain, asthma, and multiple disease who came with chest pain with radiation to the epigastric region along with headache and blurred vision.  The patient had more of reflux symptoms and was noted for chest pain with acute myocardial infarction.  BRIEF HOSPITAL COURSE: 1. Chest pain.  The patient's chest pain resolved, actually he had     more GERD-like symptoms than chest pain after blood pressure     control and he has been on Protonix and cardiac enzymes x3 have     been negative.  The patient does have mild borderline     hypercholesterolemia with a strong positive family history of heart     disease and hypertension.  The patient is at moderate risk for CAD.     He at this time is stable.  He will benefit from outpatient cardiac     stress test.  I have told the  patient and talked to his primary     care doctor who can schedule a stress test for him as an     outpatient. 2. Hypertension.  The patient's echo shows moderate LVH, so he has     been having hypertension uncontrolled for quite some time now.  We     have started the patient on metoprolol 25 b.i.d., which will be     continued and he might need another medication, which will be ACE     inhibitor or amlodipine depending on the discretion of the primary     care physician if the blood pressure is not very well controlled     over the next few weeks.  The patient any ways will keep record of     the blood pressure then check the primary care provider in 2 weeks     to see how he is doing with this medication. 3. Chronic pain.  The patient will continue hydrocodone, Cymbalta, and     morphine for pain control. 4. GERD.  The patient will continue to use omeprazole 20 mg p.o.  b.i.d.  At this time, the patient is stable for discharge and his vitals on the day of discharge; temperature 98.1, pulse 89, respirations 14, blood pressure 138/92, O2 sats 96% on room air.  The patient also had mild leukocytosis, which is thought to be reactive at this time as the patient is afebrile during the hospital stay.  MEDICATIONS AT DISCHARGE: 1. Metoprolol 25 mg p.o. b.i.d. 2. Temazepam 10 mg p.o. daily at bedtime. 3. Cymbalta 60 mg 1 capsule every morning. 4. Hydrocodone 25 mg 1-2 tablets p.o. every 6 hours as needed. 5. Protonix 10 mg p.o. daily. 6. Morphine sulfate 30 mg 1 tablet 5 times a day as needed. 7. Multivitamin 1 tablet p.o. daily. 8. Omeprazole 20 mg p.o. b.i.d.     Eleonore Chiquito, MD     GL/MEDQ  D:  12/21/2010  T:  12/21/2010  Job:  [id number]  cc:   Merrilee Seashore, M.D.  Electronically Signed by Eleonore Chiquito  on 01/14/2011 04:27:30 PM

## 2011-01-19 NOTE — H&P (Signed)
NAME:  Jesus Brown, Jesus Brown NO.:  [id number]  MEDICAL RECORD NO.:  [id number]  LOCATION:  [id number]                         FACILITY:  [facility]  PHYSICIAN:  Barbette Merino, M.D.      DATE OF BIRTH:  1965/07/11  DATE OF ADMISSION:  12/19/2010 DATE OF DISCHARGE:                             HISTORY & PHYSICAL   PRIMARY CARE PHYSICIAN:  Merrilee Seashore, MD  PRESENTING COMPLAINT:  High blood pressure, blurry vision, and abdominal pain.  HISTORY OF PRESENT ILLNESS:  The patient is a 45 year old gentleman with history of chronic pain, asthma, and multiple disk disease who presented with chest pain, mainly epigastric pain with headache, and blurry vision.  The patient's epigastric pain was referred to his upper chest to his lower chest.  His blood pressure was elevated on arrival in the ED.  Seems to be having more or less nonspecific symptoms.  He has risk factors for coronary artery disease including family history, hypertension, as well as being a male sex, although he is only 45.  His is being admitted for further management and rule out MI.  PAST MEDICAL HISTORY:  Significant for: 1. Asthma. 2. GERD. 3. Migraine headaches. 4. Degenerative disk disease with multiple previous surgeries. 5. History of psoriasis.  ALLERGIES:  INDOCIN.  CURRENT MEDICATIONS:  Cymbalta, hydrocodone and acetaminophen, loratadine, morphine, and omeprazole.  SOCIAL HISTORY:  He lives in [place], is very active and denies any active tobacco, alcohol, or IV drug use.  FAMILY HISTORY:  Father died of MI at age of 44, also hypertension runs in the family.  REVIEW OF SYSTEMS:  All systems reviewed are negative except per HPI.  PHYSICAL EXAMINATION:  VITAL SIGNS:  Temperature is 98.5, blood pressure 177/109, pulse 83, respiratory rate 18, sats 97% on room air. GENERAL:  He is awake, alert, oriented, very pleasant man, who is in no acute distress. HEENT:  PERRL.  EOMI.  No significant  pallor, no jaundice.  No rhinorrhea. NECK:  Supple.  No JVD, no lymphadenopathy. RESPIRATORY:  He has good air entry bilaterally.  No wheezes, no rales, no crackles. CARDIOVASCULAR:  He has S1 and S2, no audible murmur. ABDOMEN:  Soft, full, nontender with positive bowel sounds. EXTREMITIES:  No edema, cyanosis, or clubbing. SKIN:  No rashes or ulcers.  LABORATORY DATA:  White count is 12.4, hemoglobin 18.3, platelet count of 214.  Troponin I is negative.  Sodium 139, potassium 3.9, chloride 103, CO2 of 26, glucose 98, BUN 11, creatinine 0.84 with a calcium of 9.3.  PT 13.4, INR 1.0.  His LFTs are within normal limits.  Chest x-ray showed no active disease. Head CT showed normal findings. EKG showed normal sinus rhythm with no significant ST-T wave changes.  ASSESSMENT:  This is a 45 year old gentleman presenting with atypical symptoms for chest pain but the pain was relieved with nitro.  He has some risk factors for coronary artery disease as well.  His chest pain, therefore, could be severe gastroesophageal reflux disease.  PLAN: 1. Chest pain.  Admit the patient for observation.  Check serial     cardiac enzymes.  If they are negative, probably the patient needs  a stress test.  He was admitted in 2010 with similar symptoms but     he left AMA before his stress test was done.  The patient is     willing to have it done now. 2. GERD.  I will put him on PPIs. 3. Persistent headache.  This could be his migraine headache or     related to the nitro he received, so we will use nitro only as     needed sublingual. 4. History of asthma.  He has no active asthma exacerbation at this     point.  I will, however, give him breathing nebulizers as needed. 5. Degenerative disk disease.  He has had multiple surgeries, and the     patient is on chronic pain medicine, we will continue with that. 6. Psoriatic arthritis.  Again, he is stable at this point from our     point of view.  We  will treat him accordingly. 7. Elevated blood pressure.  He is not on any blood pressure     medication, but with nitro, his blood pressure has normalized.  If     he continues to rise, we will discharge him on some blood pressure     medicine.     Barbette Merino, M.D.     [place]  D:  12/20/2010  T:  12/20/2010  Job:  [id number]  Electronically Signed by Barbette Merino M.D. on 01/19/2011 03:07:37 PM

## 2011-02-21 ENCOUNTER — Other Ambulatory Visit: Payer: Self-pay | Admitting: Orthopedic Surgery

## 2011-02-21 DIAGNOSIS — R609 Edema, unspecified: Secondary | ICD-10-CM

## 2011-02-21 DIAGNOSIS — M25562 Pain in left knee: Secondary | ICD-10-CM

## 2011-02-21 DIAGNOSIS — R531 Weakness: Secondary | ICD-10-CM

## 2011-02-23 ENCOUNTER — Ambulatory Visit
Admission: RE | Admit: 2011-02-23 | Discharge: 2011-02-23 | Disposition: A | Discharge status: Home / Self Care | Payer: Non-veteran care | Source: Ambulatory Visit | Attending: Orthopedic Surgery | Admitting: Orthopedic Surgery

## 2011-02-23 DIAGNOSIS — R531 Weakness: Secondary | ICD-10-CM

## 2011-02-23 DIAGNOSIS — M25562 Pain in left knee: Secondary | ICD-10-CM

## 2011-02-23 DIAGNOSIS — R609 Edema, unspecified: Secondary | ICD-10-CM

## 2011-07-03 ENCOUNTER — Other Ambulatory Visit: Payer: Self-pay | Admitting: Internal Medicine

## 2011-07-03 DIAGNOSIS — I872 Venous insufficiency (chronic) (peripheral): Secondary | ICD-10-CM

## 2011-07-10 ENCOUNTER — Ambulatory Visit
Admission: RE | Admit: 2011-07-10 | Discharge: 2011-07-10 | Disposition: A | Discharge status: Home / Self Care | Payer: BC Managed Care – PPO | Source: Ambulatory Visit | Attending: Internal Medicine | Admitting: Internal Medicine

## 2011-07-10 DIAGNOSIS — I872 Venous insufficiency (chronic) (peripheral): Secondary | ICD-10-CM

## 2011-07-28 ENCOUNTER — Encounter (HOSPITAL_COMMUNITY): Payer: Self-pay | Admitting: Emergency Medicine

## 2011-07-28 ENCOUNTER — Emergency Department (HOSPITAL_COMMUNITY)
Admission: EM | Admit: 2011-07-28 | Discharge: 2011-07-29 | Disposition: A | Discharge status: Home / Self Care | Payer: BC Managed Care – PPO | Attending: Emergency Medicine | Admitting: Emergency Medicine

## 2011-07-28 ENCOUNTER — Emergency Department (HOSPITAL_COMMUNITY): Payer: BC Managed Care – PPO

## 2011-07-28 DIAGNOSIS — Z79899 Other long term (current) drug therapy: Secondary | ICD-10-CM | POA: Insufficient documentation

## 2011-07-28 DIAGNOSIS — M79609 Pain in unspecified limb: Secondary | ICD-10-CM | POA: Insufficient documentation

## 2011-07-28 DIAGNOSIS — G8929 Other chronic pain: Secondary | ICD-10-CM | POA: Insufficient documentation

## 2011-07-28 DIAGNOSIS — L02419 Cutaneous abscess of limb, unspecified: Secondary | ICD-10-CM | POA: Insufficient documentation

## 2011-07-28 DIAGNOSIS — L0390 Cellulitis, unspecified: Secondary | ICD-10-CM

## 2011-07-28 DIAGNOSIS — M542 Cervicalgia: Secondary | ICD-10-CM | POA: Insufficient documentation

## 2011-07-28 DIAGNOSIS — I10 Essential (primary) hypertension: Secondary | ICD-10-CM | POA: Insufficient documentation

## 2011-07-28 DIAGNOSIS — L03119 Cellulitis of unspecified part of limb: Secondary | ICD-10-CM | POA: Insufficient documentation

## 2011-07-28 HISTORY — DX: Other chronic pain: G89.29

## 2011-07-28 HISTORY — DX: Essential (primary) hypertension: I10

## 2011-07-28 HISTORY — DX: Dorsalgia, unspecified: M54.9

## 2011-07-28 HISTORY — DX: Cervicalgia: M54.2

## 2011-07-28 LAB — DIFFERENTIAL
Basophils Absolute: 0 10*3/uL (ref 0.0–0.1)
Basophils Relative: 0 % (ref 0–1)
Lymphocytes Relative: 29 % (ref 12–46)
Monocytes Absolute: 1.1 10*3/uL — ABNORMAL HIGH (ref 0.1–1.0)
Neutro Abs: 7.6 10*3/uL (ref 1.7–7.7)
Neutrophils Relative %: 61 % (ref 43–77)

## 2011-07-28 LAB — POCT I-STAT, CHEM 8
HCT: 57 % — ABNORMAL HIGH (ref 39.0–52.0)
Hemoglobin: 19.4 g/dL — ABNORMAL HIGH (ref 13.0–17.0)
Potassium: 3.7 mEq/L (ref 3.5–5.1)
Sodium: 140 mEq/L (ref 135–145)
TCO2: 27 mmol/L (ref 0–100)

## 2011-07-28 LAB — CBC
HCT: 52 % (ref 39.0–52.0)
Hemoglobin: 18.5 g/dL — ABNORMAL HIGH (ref 13.0–17.0)
MCHC: 35.6 g/dL (ref 30.0–36.0)
RDW: 13.4 % (ref 11.5–15.5)
WBC: 12.6 10*3/uL — ABNORMAL HIGH (ref 4.0–10.5)

## 2011-07-28 LAB — PROTIME-INR: INR: 1.02 (ref 0.00–1.49)

## 2011-07-28 MED ORDER — DOXYCYCLINE HYCLATE 100 MG PO TABS
ORAL_TABLET | ORAL | Status: AC
  Filled 2011-07-28: qty 1

## 2011-07-28 MED ORDER — DOXYCYCLINE HYCLATE 100 MG IV SOLR: 100.0000 mg | Freq: Once | INTRAVENOUS | Status: DC

## 2011-07-28 MED ORDER — DOXYCYCLINE HYCLATE 100 MG PO TABS
100.0000 mg | ORAL_TABLET | Freq: Once | ORAL | Status: AC
  Administered 2011-07-28: 100 mg via ORAL

## 2011-07-28 MED ORDER — OXYCODONE-ACETAMINOPHEN 5-325 MG PO TABS
2.0000 | ORAL_TABLET | Freq: Once | ORAL | Status: DC
  Filled 2011-07-28: qty 1

## 2011-07-28 NOTE — ED Notes (Signed)
EDP at [facility], pt seen by EDP prior to RN assessment, see MD notes, pending orders.

## 2011-07-28 NOTE — ED Notes (Addendum)
Swelling: R calf measured at 44cm (marked), R ankle measured at 26cm (marked). Patch of redness circled on leg and measures 14cm diameter at longest point. Pt declines pain med at this time "d/t pt under pain management and takes morphine daily and wants to just take his usual pain med regimen".

## 2011-07-28 NOTE — ED Notes (Addendum)
Pt tripped over crate Monday 1 week ago, not seen for this prior to tonight, rolled R ankle laterally, bruising noted to medial foot below ankle, swelling & redness noted up to calf, patch of darker red noted to anterolateral R calf, (will measure and mark swelling and redness). Wife at side.

## 2011-07-28 NOTE — ED Notes (Signed)
Pt tripped over crate at home last Monday.  Denies pain at that time.  Reports pain and swelling since Friday and bruising since Saturday to R lower leg.  Pt has plates and screws from previous surgery in R ankle.  Pt concerned about possible DVT.

## 2011-07-28 NOTE — ED Notes (Signed)
Pt to xray via stretcher, alert, NAD, calm, interactive. Wife at [facility].

## 2011-07-28 NOTE — ED Provider Notes (Addendum)
History     CSN: [id number]  Arrival date & time 07/28/11  2121   First MD Initiated Contact with Patient 07/28/11 2259      Chief Complaint  Patient presents with  . Leg Pain    (Consider location/radiation/quality/duration/timing/severity/associated sxs/prior treatment) Patient is a 46 y.o. male presenting with leg pain. The history is provided by the patient and the spouse. No language interpreter was used.  Leg Pain  The incident occurred more than 1 week ago. The incident occurred at home. The injury mechanism was a fall. The pain is present in the right leg. The quality of the pain is described as burning. The pain is at a severity of 7/10. The pain is severe. The pain has been constant since onset. Pertinent negatives include no numbness, no inability to bear weight, no loss of motion, no muscle weakness, no loss of sensation and no tingling. He reports no foreign bodies present. The symptoms are aggravated by nothing. He has tried nothing for the symptoms. The treatment provided no relief.  Swelling bruising and redness noted for the last 8 days since fall  Over crate.    Past Medical History  Diagnosis Date  . Hypertension   . Back pain, chronic   . Neck pain, chronic     Past Surgical History  Procedure Date  . Ankle surgery   . Back surgery   . Shoulder surgery   . Wrist surgery   . Knee surgery   . Hernia repair     History reviewed. No pertinent family history.  History  Substance Use Topics  . Smoking status: Never Smoker   . Smokeless tobacco: Never Used  . Alcohol Use: No      Review of Systems  Respiratory: Negative for chest tightness and shortness of breath.   Cardiovascular: Positive for leg swelling. Negative for chest pain.  Neurological: Negative for tingling and numbness.  All other systems reviewed and are negative.    Allergies  Adhesive and Indocin  Home Medications   Current Outpatient Rx  Name Route Sig Dispense Refill  .  LORATADINE 10 MG PO TABS Oral Take 10 mg by mouth daily.    . MORPHINE SULFATE 15 MG PO TABS Oral Take 15 mg by mouth every 3 (three) hours as needed. For pain    . OMEPRAZOLE 20 MG PO CPDR Oral Take 20 mg by mouth daily.    Marland Kitchen PRESCRIPTION MEDICATION Subcutaneous Inject into the skin every 7 (seven) days. Medication:Allergy shot (name of medication unknown). Day given varies per patient.    . TESTOSTERONE IM Intramuscular Inject into the muscle every 14 (fourteen) days.    Marland Kitchen VALSARTAN-HYDROCHLOROTHIAZIDE 160-25 MG PO TABS Oral Take 1 tablet by mouth daily.      BP 126/86  Pulse 106  Temp(Src) 97.8 F (36.6 C) (Oral)  Resp 19  SpO2 95%  Physical Exam  Constitutional: He is oriented to person, place, and time. He appears well-developed and well-nourished.  HENT:  Head: Normocephalic and atraumatic.  Eyes: EOM are normal.  Neck: Normal range of motion. Neck supple.  Cardiovascular: Normal rate and regular rhythm.   Pulmonary/Chest: Effort normal and breath sounds normal. He has no wheezes.  Abdominal: Soft. Bowel sounds are normal. There is no tenderness. There is no rebound and no guarding.  Musculoskeletal: Normal range of motion.       Negative homan  Neurological: He is alert and oriented to person, place, and time.  Skin: Skin is warm  and dry. Rash noted.     Psychiatric: He has a normal mood and affect.    ED Course  Procedures (including critical care time)  Labs Reviewed - No data to display No results found.   No diagnosis found.    MDM  Patient states tetanus UTD.  Will start on antibiotics for cellulitis and bring back for doppler in the am.  Follow up with both PMD and orthopedics.  Return immediately for worsening swelling, loss of sensation, worsening reddening, streaking up the leg fevers chills or any concerns   Order placed for outpatient doppler of the RLE.  Message left with vascular lab if study positive send to ED for treatment    Patient refused  Lovenox intervention and has decision making capacity to refuse but did agree to follow up for doppler in am  Jailynn Lavalais K Aleyna Cueva-Rasch, MD 07/29/11 0021    Quenisha Lovins Alfonso Patten, MD 07/29/11 0104

## 2011-07-29 ENCOUNTER — Ambulatory Visit (HOSPITAL_COMMUNITY)
Admission: RE | Admit: 2011-07-29 | Discharge: 2011-07-29 | Disposition: A | Discharge status: Home / Self Care | Payer: BC Managed Care – PPO | Source: Ambulatory Visit | Attending: Emergency Medicine | Admitting: Emergency Medicine

## 2011-07-29 DIAGNOSIS — M7989 Other specified soft tissue disorders: Secondary | ICD-10-CM

## 2011-07-29 MED ORDER — DOXYCYCLINE HYCLATE 100 MG PO CAPS: 100.0000 mg | ORAL_CAPSULE | Freq: Two times a day (BID) | ORAL | Status: AC

## 2011-07-29 MED ORDER — ENOXAPARIN SODIUM 120 MG/0.8ML ~~LOC~~ SOLN
110.0000 mg | Freq: Once | SUBCUTANEOUS | Status: DC
  Filled 2011-07-29: qty 0.8

## 2011-07-29 MED ORDER — CEPHALEXIN 500 MG PO CAPS: 500.0000 mg | ORAL_CAPSULE | Freq: Four times a day (QID) | ORAL | Status: AC

## 2011-07-29 MED ORDER — HYDROCODONE-ACETAMINOPHEN 5-500 MG PO TABS: 1.0000 | ORAL_TABLET | Freq: Four times a day (QID) | ORAL | Status: AC | PRN

## 2011-07-29 NOTE — ED Notes (Signed)
Pt refused lovenox at time of d/c. Verbalizes understanding of rationale for med. Wife present.  vasc lab contacted. Pt to return in am, Rx x3 given.

## 2011-07-29 NOTE — Discharge Instructions (Signed)
Cellulitis Cellulitis is an infection of the tissue under the skin. The infected area is usually red and tender. This is caused by germs. These germs enter the body through cuts or sores. This usually happens in the arms or lower legs. HOME CARE   Take your medicine as told. Finish it even if you start to feel better.   If the infection is on the arm or leg, keep it raised (elevated).   Use a warm cloth on the infected area several times a day.   See your doctor for a follow-up visit as told.  GET HELP RIGHT AWAY IF:   You are tired or confused.   You throw up (vomit).   You have watery poop (diarrhea).   You feel ill and have muscle aches.   You have a fever.  MAKE SURE YOU:   Understand these instructions.   Will watch your condition.   Will get help right away if you are not doing well or get worse.  Document Released: 09/11/2007 Document Revised: 03/14/2011 Document Reviewed: 02/24/2009 [facility] Patient Information 2012 [place].

## 2011-07-29 NOTE — Progress Notes (Signed)
VASCULAR LAB PRELIMINARY  PRELIMINARY  PRELIMINARY  PRELIMINARY  Right lower extremity venous duplex completed.    Preliminary report:  Right:  No evidence of DVT, superficial thrombosis, or Baker's cyst.  Judithann Sauger, RVT 07/29/2011, 9:16 AM

## 2011-07-31 ENCOUNTER — Ambulatory Visit
Admission: RE | Admit: 2011-07-31 | Discharge: 2011-07-31 | Disposition: A | Discharge status: Home / Self Care | Payer: BC Managed Care – PPO | Source: Ambulatory Visit | Attending: Internal Medicine | Admitting: Internal Medicine

## 2011-07-31 ENCOUNTER — Other Ambulatory Visit: Payer: Self-pay | Admitting: Internal Medicine

## 2011-07-31 DIAGNOSIS — M7989 Other specified soft tissue disorders: Secondary | ICD-10-CM

## 2011-07-31 MED ORDER — GADOBENATE DIMEGLUMINE 529 MG/ML IV SOLN
10.0000 mL | Freq: Once | INTRAVENOUS | Status: AC | PRN
  Administered 2011-07-31: 10 mL via INTRAVENOUS

## 2012-07-17 ENCOUNTER — Other Ambulatory Visit: Payer: Self-pay | Admitting: Specialist

## 2012-07-17 DIAGNOSIS — M545 Low back pain, unspecified: Secondary | ICD-10-CM

## 2012-07-23 ENCOUNTER — Ambulatory Visit
Admission: RE | Admit: 2012-07-23 | Discharge: 2012-07-23 | Disposition: A | Discharge status: Home / Self Care | Payer: BC Managed Care – PPO | Source: Ambulatory Visit | Attending: Specialist | Admitting: Specialist

## 2012-07-23 DIAGNOSIS — M545 Low back pain, unspecified: Secondary | ICD-10-CM

## 2012-07-23 MED ORDER — GADOBENATE DIMEGLUMINE 529 MG/ML IV SOLN
20.0000 mL | Freq: Once | INTRAVENOUS | Status: AC | PRN
  Administered 2012-07-23: 20 mL via INTRAVENOUS

## 2012-08-03 ENCOUNTER — Emergency Department (HOSPITAL_COMMUNITY)
Admission: EM | Admit: 2012-08-03 | Discharge: 2012-08-03 | Disposition: A | Discharge status: Home / Self Care | Payer: BC Managed Care – PPO | Attending: Emergency Medicine | Admitting: Emergency Medicine

## 2012-08-03 ENCOUNTER — Encounter (HOSPITAL_COMMUNITY): Payer: Self-pay | Admitting: *Deleted

## 2012-08-03 ENCOUNTER — Emergency Department (HOSPITAL_COMMUNITY): Payer: BC Managed Care – PPO

## 2012-08-03 ENCOUNTER — Encounter (HOSPITAL_COMMUNITY)
Admission: EM | Disposition: A | Discharge status: Home / Self Care | Payer: Self-pay | Source: Home / Self Care | Attending: Emergency Medicine

## 2012-08-03 DIAGNOSIS — Y929 Unspecified place or not applicable: Secondary | ICD-10-CM | POA: Insufficient documentation

## 2012-08-03 DIAGNOSIS — Z8739 Personal history of other diseases of the musculoskeletal system and connective tissue: Secondary | ICD-10-CM | POA: Insufficient documentation

## 2012-08-03 DIAGNOSIS — IMO0002 Reserved for concepts with insufficient information to code with codable children: Secondary | ICD-10-CM | POA: Insufficient documentation

## 2012-08-03 DIAGNOSIS — G8929 Other chronic pain: Secondary | ICD-10-CM | POA: Insufficient documentation

## 2012-08-03 DIAGNOSIS — I10 Essential (primary) hypertension: Secondary | ICD-10-CM | POA: Insufficient documentation

## 2012-08-03 DIAGNOSIS — K219 Gastro-esophageal reflux disease without esophagitis: Secondary | ICD-10-CM | POA: Insufficient documentation

## 2012-08-03 DIAGNOSIS — T17208A Unspecified foreign body in pharynx causing other injury, initial encounter: Secondary | ICD-10-CM

## 2012-08-03 DIAGNOSIS — Y9389 Activity, other specified: Secondary | ICD-10-CM | POA: Insufficient documentation

## 2012-08-03 DIAGNOSIS — Z79899 Other long term (current) drug therapy: Secondary | ICD-10-CM | POA: Insufficient documentation

## 2012-08-03 HISTORY — PX: ESOPHAGOGASTRODUODENOSCOPY: SHX5428

## 2012-08-03 HISTORY — DX: Gastro-esophageal reflux disease without esophagitis: K21.9

## 2012-08-03 HISTORY — DX: Unspecified osteoarthritis, unspecified site: M19.90

## 2012-08-03 LAB — CBC WITH DIFFERENTIAL/PLATELET
Basophils Relative: 1 % (ref 0–1)
Eosinophils Absolute: 0.2 10*3/uL (ref 0.0–0.7)
Hemoglobin: 15.1 g/dL (ref 13.0–17.0)
Lymphs Abs: 3.1 10*3/uL (ref 0.7–4.0)
MCH: 30.6 pg (ref 26.0–34.0)
MCHC: 36.5 g/dL — ABNORMAL HIGH (ref 30.0–36.0)
Monocytes Relative: 7 % (ref 3–12)
Neutro Abs: 4 10*3/uL (ref 1.7–7.7)
Neutrophils Relative %: 51 % (ref 43–77)
Platelets: 198 10*3/uL (ref 150–400)
RBC: 4.94 MIL/uL (ref 4.22–5.81)

## 2012-08-03 LAB — BASIC METABOLIC PANEL
BUN: 24 mg/dL — ABNORMAL HIGH (ref 6–23)
Chloride: 101 mEq/L (ref 96–112)
GFR calc Af Amer: >90 mL/min (ref >90)
GFR calc non Af Amer: >90 mL/min (ref >90)
Potassium: 3 mEq/L — ABNORMAL LOW (ref 3.5–5.1)
Sodium: 141 mEq/L (ref 135–145)

## 2012-08-03 SURGERY — EGD (ESOPHAGOGASTRODUODENOSCOPY)
Anesthesia: Moderate Sedation

## 2012-08-03 MED ORDER — MIDAZOLAM HCL 10 MG/2ML IJ SOLN
INTRAMUSCULAR | Status: DC | PRN
  Administered 2012-08-03 (×3): 2 mg via INTRAVENOUS

## 2012-08-03 MED ORDER — ONDANSETRON HCL 4 MG/2ML IJ SOLN
4.0000 mg | Freq: Once | INTRAMUSCULAR | Status: AC
  Administered 2012-08-03: 4 mg via INTRAVENOUS
  Filled 2012-08-03: qty 2

## 2012-08-03 MED ORDER — BUTAMBEN-TETRACAINE-BENZOCAINE 2-2-14 % EX AERO
INHALATION_SPRAY | CUTANEOUS | Status: DC | PRN
  Administered 2012-08-03: 2 via TOPICAL

## 2012-08-03 MED ORDER — FENTANYL CITRATE 0.05 MG/ML IJ SOLN
INTRAMUSCULAR | Status: DC | PRN
  Administered 2012-08-03 (×2): 25 ug via INTRAVENOUS

## 2012-08-03 MED ORDER — HYDROMORPHONE HCL PF 1 MG/ML IJ SOLN
1.0000 mg | Freq: Once | INTRAMUSCULAR | Status: AC
  Administered 2012-08-03: 1 mg via INTRAVENOUS
  Filled 2012-08-03: qty 1

## 2012-08-03 NOTE — ED Notes (Addendum)
Pt has history of three level cervical fusion with plates and since this has had problems with getting food stuck in his throat.  Thirty minutes ago he ate apple and it is stuck in his throat and pt has severe pain.  Pt usually has to have an endoscopy procedure to get it out.  Pt states that he has been drooling.

## 2012-08-03 NOTE — ED Notes (Signed)
Pt transported to ENDO 

## 2012-08-03 NOTE — ED Provider Notes (Signed)
History     CSN: [id number]  Arrival date & time 08/03/12  74   First MD Initiated Contact with Patient 08/03/12 1816      Chief Complaint  Patient presents with  . apple stuck in throat     (Consider location/radiation/quality/duration/timing/severity/associated sxs/prior treatment) Patient is a 47 y.o. male presenting with GI illness. The history is provided by the patient, the spouse and medical records. No language interpreter was used.  GI Problem This is a new problem. The current episode started less than 1 hour ago (Pt was eating an apple and it got stuck in his throat.  He has had this happen several times in the past, most recently a couple of years ago, treated at [place].). The problem occurs constantly. The problem has not changed since onset.Pertinent negatives include no chest pain, no abdominal pain, no headaches and no shortness of breath. Associated symptoms comments: He cannot swallow his saliva.. Nothing aggravates the symptoms. Nothing relieves the symptoms. He has tried nothing for the symptoms.    Past Medical History  Diagnosis Date  . Hypertension   . Back pain, chronic   . Neck pain, chronic     Past Surgical History  Procedure Laterality Date  . Ankle surgery    . Back surgery    . Shoulder surgery    . Wrist surgery    . Knee surgery    . Hernia repair      No family history on file.  History  Substance Use Topics  . Smoking status: Never Smoker   . Smokeless tobacco: Never Used  . Alcohol Use: No      Review of Systems  Constitutional: Negative for fever and chills.  HENT: Negative.   Eyes: Negative.   Respiratory: Negative.  Negative for shortness of breath.   Cardiovascular: Negative for chest pain.  Gastrointestinal: Negative for abdominal pain.       Inability to swallow.  Genitourinary: Negative.   Musculoskeletal: Negative.   Skin: Negative.   Neurological: Negative for headaches.  Psychiatric/Behavioral: Negative.      Allergies  Adhesive and Indocin  Home Medications   Current Outpatient Rx  Name  Route  Sig  Dispense  Refill  . Aspirin-Acetaminophen-Caffeine (GOODY HEADACHE PO)   Oral   Take 1 packet by mouth daily.         Marland Kitchen loratadine (CLARITIN) 10 MG tablet   Oral   Take 10 mg by mouth daily.         Marland Kitchen morphine (MSIR) 30 MG tablet   Oral   Take 30 mg by mouth 5 (five) times daily as needed for pain.         Marland Kitchen omeprazole (PRILOSEC) 20 MG capsule   Oral   Take 20 mg by mouth 2 (two) times daily.          Marland Kitchen PRESCRIPTION MEDICATION   Subcutaneous   Inject into the skin every 7 (seven) days. Medication:Allergy shot (name of medication unknown). Day given varies per patient.         . Testosterone (ANDROGEL) 20.25 MG/1.25GM (1.62%) GEL   Transdermal   Place 1 application onto the skin daily.         . valsartan-hydrochlorothiazide (DIOVAN-HCT) 160-25 MG per tablet   Oral   Take 1 tablet by mouth daily.           BP 139/89  Pulse 87  Temp(Src) 98.4 F (36.9 C) (Oral)  Resp 20  SpO2 97%  Physical Exam  Nursing note and vitals reviewed. Constitutional: He is oriented to person, place, and time. He appears well-developed and well-nourished.  In moderate distress, using a towel in his mouth to absorb saliva.  HENT:  Head: Normocephalic and atraumatic.  Right Ear: External ear normal.  Left Ear: External ear normal.  Mouth/Throat: Oropharynx is clear and moist.  Eyes: Conjunctivae and EOM are normal. Pupils are equal, round, and reactive to light.  Neck: Normal range of motion. Neck supple.  Cardiovascular: Normal rate, regular rhythm and normal heart sounds.   Pulmonary/Chest: Effort normal and breath sounds normal.  Abdominal: Soft. Bowel sounds are normal. He exhibits no distension. There is no tenderness.  Musculoskeletal: Normal range of motion. He exhibits no edema.  Neurological: He is alert and oriented to person, place, and time.  No sensory or motor  deficit.  Skin: Skin is warm and dry.  Psychiatric: He has a normal mood and affect. His behavior is normal.    ED Course  Procedures (including critical care time)  Labs Reviewed  CBC WITH DIFFERENTIAL  BASIC METABOLIC PANEL  URINALYSIS, ROUTINE W REFLEX MICROSCOPIC   7:01 PM [facility] will see pt and perform upper endoscopy in his room.    7:27 PM  Date: 08/03/2012  Rate: 71  Rhythm: normal sinus rhythm  QRS Axis: normal  Intervals: normal  ST/T Wave abnormalities: normal  Conduction Disutrbances:none  Narrative Interpretation: Normal EKG  Old EKG Reviewed: none available      1. Foreign body in throat, initial encounter            Mylinda Latina III, MD 08/04/12 [phone]

## 2012-08-03 NOTE — H&P (Signed)
[facility] Consult Note  Chief Complaint: esophageal foreign body  HPI: Jesus Brown is an 47 y.o. male with sensation of apple stuck in throat.  Several episodes over the years, all post-dating cervical spine surgery.  He has some sialorrhea.  Is on chronic narcotics for neck and back pain.  Past Medical History  Diagnosis Date  . Hypertension   . Back pain, chronic   . Neck pain, chronic   . GERD (gastroesophageal reflux disease)   . Arthritis     neck, knees    Past Surgical History  Procedure Laterality Date  . Ankle surgery    . Back surgery    . Shoulder surgery    . Wrist surgery    . Knee surgery    . Hernia repair      Medications Prior to Admission  Medication Sig Dispense Refill  . Aspirin-Acetaminophen-Caffeine (GOODY HEADACHE PO) Take 1 packet by mouth daily.      Marland Kitchen loratadine (CLARITIN) 10 MG tablet Take 10 mg by mouth daily.      Marland Kitchen LORazepam (ATIVAN) 0.5 MG tablet Take 0.5 mg by mouth every 8 (eight) hours.      Marland Kitchen morphine (MSIR) 30 MG tablet Take 30 mg by mouth 5 (five) times daily as needed for pain.      Marland Kitchen omeprazole (PRILOSEC) 20 MG capsule Take 20 mg by mouth 2 (two) times daily.       Marland Kitchen PRESCRIPTION MEDICATION Inject into the skin every 7 (seven) days. Medication:Allergy shot (name of medication unknown). Day given varies per patient.      . Testosterone (ANDROGEL) 20.25 MG/1.25GM (1.62%) GEL Place 1 application onto the skin daily.      . valsartan-hydrochlorothiazide (DIOVAN-HCT) 160-25 MG per tablet Take 1 tablet by mouth daily.        Allergies:  Allergies  Allergen Reactions  . Adhesive (Tape) Other (See Comments)    blisters  . Indocin (Indomethacin) Other (See Comments)    Burns stomach    History reviewed. No pertinent family history.  Social History:  reports that he has never smoked. He has never used smokeless tobacco. He reports that he does not drink alcohol or use illicit drugs.   ROS: As per HPI all others  negative.   Blood pressure 130/84, pulse 76, temperature 98.4 F (36.9 C), temperature source Oral, resp. rate 11, height 5\' 8"  (1.727 m), weight 103.42 kg (228 lb), SpO2 100.00%. General appearance: NAD Neck:  No crepitus LUNGS:  CTA CV:  RRR ABD:  Soft  Results for orders placed during the hospital encounter of 08/03/12 (from the past 48 hour(s))  CBC WITH DIFFERENTIAL     Status: Abnormal   Collection Time    08/03/12  6:29 PM      Result Value Range   WBC 7.9  4.0 - 10.5 K/uL   RBC 4.94  4.22 - 5.81 MIL/uL   Hemoglobin 15.1  13.0 - 17.0 g/dL   HCT 41.4  39.0 - 52.0 %   MCV 83.8  78.0 - 100.0 fL   MCH 30.6  26.0 - 34.0 pg   MCHC 36.5 (*) 30.0 - 36.0 g/dL   RDW 12.8  11.5 - 15.5 %   Platelets 198  150 - 400 K/uL   Neutrophils Relative 51  43 - 77 %   Neutro Abs 4.0  1.7 - 7.7 K/uL   Lymphocytes Relative 40  12 - 46 %   Lymphs Abs 3.1  0.7 -  4.0 K/uL   Monocytes Relative 7  3 - 12 %   Monocytes Absolute 0.5  0.1 - 1.0 K/uL   Eosinophils Relative 2  0 - 5 %   Eosinophils Absolute 0.2  0.0 - 0.7 K/uL   Basophils Relative 1  0 - 1 %   Basophils Absolute 0.0  0.0 - 0.1 K/uL  BASIC METABOLIC PANEL     Status: Abnormal   Collection Time    08/03/12  6:29 PM      Result Value Range   Sodium 141  135 - 145 mEq/L   Potassium 3.0 (*) 3.5 - 5.1 mEq/L   Chloride 101  96 - 112 mEq/L   CO2 31  19 - 32 mEq/L   Glucose, Bld 90  70 - 99 mg/dL   BUN 24 (*) 6 - 23 mg/dL   Creatinine, Ser 0.95  0.50 - 1.35 mg/dL   Calcium 9.5  8.4 - 10.5 mg/dL   GFR calc non Af Amer >90  >90 mL/min   GFR calc Af Amer >90  >90 mL/min   Comment:            The eGFR has been calculated     using the CKD EPI equation.     This calculation has not been     validated in all clinical     situations.     eGFR's persistently     <90 mL/min signify     possible Chronic Kidney Disease.   Dg Chest 2 View  08/03/2012  *RADIOLOGY REPORT*  Clinical Data: Foreign body.Foreign body lodged in the throat.   CHEST - 2 VIEW  Comparison: 12/19/2010.  Findings: There is no radiopaque foreign body identified.  No pneumothorax.  No pneumomediastinum.  No pleural effusion or airspace disease.  Cardiopericardial silhouette appears within normal limits.  No interval change from prior. Lower cervical ACDF.  IMPRESSION: Negative.   Original Report Authenticated By: Dereck Ligas, M.D.     Assessment/Plan Suspect esophageal foreign body.  Endoscopy today for further evaluation. Risks (bleeding, infection, bowel perforation that could require surgery, sedation-related changes in cardiopulmonary systems), benefits (identification and possible treatment of source of symptoms, exclusion of certain causes of symptoms), and alternatives (watchful waiting, radiographic imaging studies, empiric medical treatment) of upper endoscopy (EGD) were explained to patient in detail and patient wishes to proceed.  Landry Dyke 08/03/2012, 8:43 PM

## 2012-08-03 NOTE — ED Provider Notes (Signed)
Pt had an upper endoscopy today after presenting to ED w/ foreign body in throat.  Following procedure, he reports feeling much better.  No dysphagia/dyspnea currently.  On re-examination, VSS, no stridor, able to swallow, nml breath sounds.  Pt d/c'd home.  10:19 PM   Remer Macho, PA-C 08/03/12 2219

## 2012-08-03 NOTE — Op Note (Addendum)
[facility] [place], [id number]   ENDOSCOPY PROCEDURE REPORT  PATIENT: Jesus Brown, Jesus Brown  MR#: [id number] BIRTHDATE: 1965/07/29 , 46  yrs. old GENDER: Male ENDOSCOPIST: Arta Silence, MD REFERRED BY: PROCEDURE DATE:  08/03/2012 PROCEDURE:  EGD w/ foreign body removal ASA CLASS:     Class III INDICATIONS:  Dysphagia, suspected esophageal food impaction. MEDICATIONS: Fentanyl 75 mcg IV and Versed 6 mg IV TOPICAL ANESTHETIC: Cetacaine Spray  DESCRIPTION OF PROCEDURE: After the risks benefits and alternatives of the procedure were thoroughly explained, informed consent was obtained.  The Pentax Gastroscope [id number] endoscope was introduced through the mouth and advanced to the second portion of the duodenum. Without limitations.  The instrument was slowly withdrawn as the mucosa was fully examined.     Findings:  Chunk of apple seen at level of upper esophageal sphincter.  Using biopsy forceps, the bolus was gingerly dislodged from this area, and, once loosened, the patient ended up coughing it up on his own.  Upper esophageal sphincter (UES) area was quite stenotic, likely from prior cervical spine surgery, and I was not able to traverse it with the diagnostic EGD scope.  The pediatric XP scope was used to pass through the UES with mild-to-moderate resistance.  The remainder of the esophagus was normal except for small hiatal hernia and widely patent Schatzki's ring.  Limited views of stomach, pylorus and duodenum to the second portion were normal.          The scope was then withdrawn from the patient and the procedure completed.  ENDOSCOPIC IMPRESSION:     Food impaction at level of upper esophageal sphincter, resolved as above.  RECOMMENDATIONS:     1.  Watch for potential complications of procedure. 2.  Soft pureed-type diet for next 24 hours. 3.  Minimize fibrous meats, vegetables, fruits unless they are very well-chewed. 4.   Chew foods very carefully indefinitely. 5.  Any future food impaction episodes should be assumed to be at the upper esophageal sphincter region (as it was this time AND several years ago during a prior similar episode). 6.  Any future food impaction episodes should be handled by ENT specialists.  eSigned:  Arta Silence, MD 08/03/2012 9:20 PM   CC:

## 2012-08-04 ENCOUNTER — Encounter (HOSPITAL_COMMUNITY): Payer: Self-pay | Admitting: Gastroenterology

## 2012-08-04 NOTE — ED Provider Notes (Signed)
Medical screening examination/treatment/procedure(s) were conducted as a shared visit with non-physician practitioner(s) and myself.  I personally evaluated the patient during the encounter Pt had upper endoscopy with removal of the apple particles.  Mylinda Latina III, MD 08/04/12 [phone]

## 2012-09-23 ENCOUNTER — Other Ambulatory Visit: Payer: Self-pay | Admitting: Otolaryngology

## 2012-09-23 DIAGNOSIS — R1319 Other dysphagia: Secondary | ICD-10-CM

## 2012-09-29 ENCOUNTER — Ambulatory Visit
Admission: RE | Admit: 2012-09-29 | Discharge: 2012-09-29 | Disposition: A | Discharge status: Home / Self Care | Payer: BC Managed Care – PPO | Source: Ambulatory Visit | Attending: Otolaryngology | Admitting: Otolaryngology

## 2012-09-29 DIAGNOSIS — R1319 Other dysphagia: Secondary | ICD-10-CM

## 2012-10-30 ENCOUNTER — Other Ambulatory Visit: Payer: Self-pay | Admitting: Otolaryngology

## 2012-10-30 NOTE — H&P (Signed)
Jesus Brown, Jesus Brown 47 y.o., male [id number]     Chief Complaint:  Esophagospasm/ dysphagia  HPI: 6 year recheck for this 47 year old white male.   We saw him with swallowing difficulty following cervical fusion last time. A barium swallow was basically normal. Subsequently, he underwent posterior cervical fusion. He has also had lumbar spine fusion. He has degenerative disease in multiple locations and requires substantial narcotic pain medicine. We thought he might have reflux last time, and placed him on omeprazole twice daily which he continues to date.     Over the past years, he has had 5 or 6 episodes where food was lodged in his throat and required a trip to the emergency room. The most recent was April 2014. He required upper GI endoscopy by Dr. Paulita Fujita who noted a chunk of apple lodged at the cricopharyngeus.  He was unable to pass an adult-sized esophagoscope through the cricopharyngeus. Patient is sent here for further assessment. He is careful to cut his food into small bits, chews carefully, and does not drink alcohol. He has not had trouble with laryngospasm or obstruction, nor hoarseness or breathing difficulty. No neck masses. No change in weight. He is always a little careful about how he eats and swallows and drinks plenty  of fluid.  He does not smoke.  He does not wear dentures. He has not had any further imaging studies of his swallowing since 2008.  One month recheck. Preop visit. Barium swallow  showed some tertiary contractions in the distal cervical esophagus, a small polypoid mass in the RIGHT distal cervical esophagus, and no obvious stricture or achalasia. No apparent involvement from his prior cervical fusion plate.   We are planning to do esophagoscopy, possible dilatation of the cricopharyngeus, possible Botox instillation of the cricopharyngeus, and evaluation and possible biopsy of the distal esophageal lesion.  PMH: Past Medical History  Diagnosis Date  . Hypertension    . Back pain, chronic   . Neck pain, chronic   . GERD (gastroesophageal reflux disease)   . Arthritis     neck, knees    Surg Hx: Past Surgical History  Procedure Laterality Date  . Ankle surgery    . Back surgery    . Shoulder surgery    . Wrist surgery    . Knee surgery    . Hernia repair    . Esophagogastroduodenoscopy N/A 08/03/2012    Procedure: ESOPHAGOGASTRODUODENOSCOPY (EGD);  Surgeon: Arta Silence, MD;  Location: [facility] ENDOSCOPY;  Service: Endoscopy;  Laterality: N/A;    FHx:  No family history on file. SocHx:  reports that he has never smoked. He has never used smokeless tobacco. He reports that he does not drink alcohol or use illicit drugs.  ALLERGIES:  Allergies  Allergen Reactions  . Adhesive (Tape) Other (See Comments)    blisters  . Indocin (Indomethacin) Other (See Comments)    Burns stomach     (Not in a hospital admission)  No results found for this or any previous visit (from the past 48 hour(s)). No results found.  [id number]: Not feeling tired (fatigue).  No fever, no night sweats, and no recent weight loss. Head: No headache. Eyes: No eye symptoms. Otolaryngeal: No hearing loss, no earache, no tinnitus, and no purulent nasal discharge.  No nasal passage blockage (stuffiness), no snoring, no sneezing, no hoarseness, and no sore throat. Cardiovascular: No chest pain or discomfort  and no palpitations. Pulmonary: No dyspnea, no cough, and no wheezing. Gastrointestinal: No dysphagia  and  no heartburn.  No nausea, no abdominal pain, and no melena.  No diarrhea. Genitourinary: No dysuria. Endocrine: No muscle weakness. Musculoskeletal: No calf muscle cramps, no arthralgias, and no soft tissue swelling. Neurological: No dizziness, no fainting, no tingling, and no numbness. Psychological: No anxiety  and no depression. Skin: No rash.  There were no vitals taken for this visit.  PHYSICAL EXAM: He is stocky and perhaps slightly overweight. Mental  status is appropriate. He appears well in conversational speech.  He is not coughing or clearing his throat during her interview. Voice is clear and respirations unlabored through the nose. The head is atraumatic and neck supple. Cranial nerves intact. Ear canals are clear with normal drums. Anterior nose is moist and patent. Oral cavity and pharynx clear. Neck with surgical scars. No adenopathy.   Using the flexible laryngoscope, the nasopharynx is clear with normal eustachian tori. Oropharynx is clear with a narrow AP diameter at the base of tongue. Hypopharynx reveals mobile vocal cords with good airway. There is some postcricoid swelling. No pooling in the valleculae or piriforms. No obvious asymmetry.  Lungs: Clear to auscultation Heart: Regular rate and rhythm without murmurs Abdomen: Soft, active Extremities: Normal configuration Neurologic: Symmetric, intact.  Studies Reviewed:  I talked with him about the barium swallow findings. There is a small polypoid lesion in the distal cervical esophagus which warrants observation and probable biopsy. There are some tertiary contractions in the distal esophagus. This does not seem to be where he is having the food impactions.  at the time of cervical esophagoscopy, we will automatically dilate the cricopharyngeus, and I would consider for Botox paralysis of the cricopharyngeus to see if this improves his symptoms. He will maintain his antireflux measures.  I will see him back here preoperatively.    Assessment/Plan Cervical dysphagia (787.29) (R13.19).  These episodes with food getting stuck are probably more painful than they are dangerous.  I cannot tell from the report of the Dr. who did the endoscopy whether the upper sphincter is scarred tight, or is just having muscle spasms. It was not this way during your previous barium swallow in 2008.  We will do another barium swallow. I will call you with these results. We may need to stretch the tight  area, or consider Botox to relax the muscle. Continue with the omeprazole twice daily. Continue with your dietary measures including cutting up the food small, and chewing carefully.  We are planning to dilate  your esophagus and/or  put in Botox  to relax the tight sphincter. There was a small area on the barium swallow that we will try to examine and possibly biopsy.  This should be relatively easy on you.  I am giving you some liquid oxycodone for pain relief because it may be difficult for you to swallow pills for a couple of days.  The Botox may take as much as 2 weeks to work fully. I do want you back on solid foods as soon as comfortable because I think this will help keep things stretched open.  I would like to see you back here in the office 3 weeks after her surgery. No aspirin-containing products for 10 days before surgery.  OxyCODONE HCl - 5 MG/5ML Oral Solution;5-15 ml po q4h prn pain; 0000000; R0; Rx.    Jodi Marble [id number], 9:36 AM

## 2012-11-02 ENCOUNTER — Encounter (HOSPITAL_BASED_OUTPATIENT_CLINIC_OR_DEPARTMENT_OTHER): Payer: Self-pay | Admitting: *Deleted

## 2012-11-02 NOTE — Progress Notes (Signed)
Pt was in [employer]-multiple injuries and multiple neck and back surgeries-chronic pain- Never had intubation problems- To come in for bmet

## 2012-11-05 ENCOUNTER — Encounter (HOSPITAL_BASED_OUTPATIENT_CLINIC_OR_DEPARTMENT_OTHER)
Admission: RE | Admit: 2012-11-05 | Discharge: 2012-11-05 | Disposition: A | Discharge status: Home / Self Care | Payer: BC Managed Care – PPO | Source: Ambulatory Visit | Attending: Otolaryngology | Admitting: Otolaryngology

## 2012-11-05 LAB — BASIC METABOLIC PANEL
BUN: 15 mg/dL (ref 6–23)
CO2: 30 mEq/L (ref 19–32)
Calcium: 9.8 mg/dL (ref 8.4–10.5)
Glucose, Bld: 97 mg/dL (ref 70–99)
Sodium: 139 mEq/L (ref 135–145)

## 2012-11-09 ENCOUNTER — Encounter (HOSPITAL_BASED_OUTPATIENT_CLINIC_OR_DEPARTMENT_OTHER)
Admission: RE | Disposition: A | Discharge status: Home / Self Care | Payer: Self-pay | Source: Ambulatory Visit | Attending: Otolaryngology

## 2012-11-09 ENCOUNTER — Encounter (HOSPITAL_BASED_OUTPATIENT_CLINIC_OR_DEPARTMENT_OTHER): Payer: Self-pay | Admitting: Anesthesiology

## 2012-11-09 ENCOUNTER — Ambulatory Visit (HOSPITAL_BASED_OUTPATIENT_CLINIC_OR_DEPARTMENT_OTHER)
Admission: RE | Admit: 2012-11-09 | Discharge: 2012-11-09 | Disposition: A | Discharge status: Home / Self Care | Payer: BC Managed Care – PPO | Source: Ambulatory Visit | Attending: Otolaryngology | Admitting: Otolaryngology

## 2012-11-09 ENCOUNTER — Ambulatory Visit (HOSPITAL_BASED_OUTPATIENT_CLINIC_OR_DEPARTMENT_OTHER): Payer: BC Managed Care – PPO | Admitting: Anesthesiology

## 2012-11-09 DIAGNOSIS — Z9109 Other allergy status, other than to drugs and biological substances: Secondary | ICD-10-CM | POA: Insufficient documentation

## 2012-11-09 DIAGNOSIS — Z981 Arthrodesis status: Secondary | ICD-10-CM | POA: Insufficient documentation

## 2012-11-09 DIAGNOSIS — R1314 Dysphagia, pharyngoesophageal phase: Secondary | ICD-10-CM

## 2012-11-09 DIAGNOSIS — Z888 Allergy status to other drugs, medicaments and biological substances status: Secondary | ICD-10-CM | POA: Insufficient documentation

## 2012-11-09 DIAGNOSIS — K219 Gastro-esophageal reflux disease without esophagitis: Secondary | ICD-10-CM | POA: Insufficient documentation

## 2012-11-09 DIAGNOSIS — I10 Essential (primary) hypertension: Secondary | ICD-10-CM | POA: Insufficient documentation

## 2012-11-09 DIAGNOSIS — G8929 Other chronic pain: Secondary | ICD-10-CM | POA: Insufficient documentation

## 2012-11-09 DIAGNOSIS — K224 Dyskinesia of esophagus: Secondary | ICD-10-CM | POA: Insufficient documentation

## 2012-11-09 DIAGNOSIS — R1310 Dysphagia, unspecified: Secondary | ICD-10-CM | POA: Insufficient documentation

## 2012-11-09 DIAGNOSIS — IMO0002 Reserved for concepts with insufficient information to code with codable children: Secondary | ICD-10-CM | POA: Insufficient documentation

## 2012-11-09 HISTORY — DX: Dysphagia, unspecified: R13.10

## 2012-11-09 HISTORY — DX: Reserved for concepts with insufficient information to code with codable children: IMO0002

## 2012-11-09 HISTORY — PX: ESOPHAGOSCOPY W/ BOTOX INJECTION: SHX1533

## 2012-11-09 HISTORY — PX: DIRECT LARYNGOSCOPY: SHX5326

## 2012-11-09 SURGERY — LARYNGOSCOPY, DIRECT
Anesthesia: General | Site: Throat | Wound class: Clean Contaminated

## 2012-11-09 MED ORDER — ONABOTULINUMTOXINA 100 UNITS IJ SOLR
INTRAMUSCULAR | Status: DC | PRN
  Administered 2012-11-09: 100 [IU] via INTRAMUSCULAR

## 2012-11-09 MED ORDER — LACTATED RINGERS IV SOLN
INTRAVENOUS | Status: DC
  Administered 2012-11-09 (×2): via INTRAVENOUS

## 2012-11-09 MED ORDER — MORPHINE SULFATE 2 MG/ML IJ SOLN: 2.0000 mg | INTRAMUSCULAR | Status: DC | PRN

## 2012-11-09 MED ORDER — SUCCINYLCHOLINE CHLORIDE 20 MG/ML IJ SOLN
INTRAMUSCULAR | Status: DC | PRN
  Administered 2012-11-09: 100 mg via INTRAVENOUS

## 2012-11-09 MED ORDER — HYDROCODONE-ACETAMINOPHEN 7.5-325 MG/15ML PO SOLN: 10.0000 mL | ORAL | Status: DC | PRN

## 2012-11-09 MED ORDER — SODIUM CHLORIDE 0.9 % IJ SOLN
INTRAMUSCULAR | Status: DC | PRN
  Administered 2012-11-09: 3 mL via INTRAVENOUS

## 2012-11-09 MED ORDER — OXYCODONE HCL 5 MG PO TABS: 5.0000 mg | ORAL_TABLET | Freq: Once | ORAL | Status: DC | PRN

## 2012-11-09 MED ORDER — MIDAZOLAM HCL 2 MG/2ML IJ SOLN: 1.0000 mg | INTRAMUSCULAR | Status: DC | PRN

## 2012-11-09 MED ORDER — FENTANYL CITRATE 0.05 MG/ML IJ SOLN: 50.0000 ug | INTRAMUSCULAR | Status: DC | PRN

## 2012-11-09 MED ORDER — OXYCODONE HCL 5 MG/5ML PO SOLN: 5.0000 mg | Freq: Once | ORAL | Status: DC | PRN

## 2012-11-09 MED ORDER — GLYCOPYRROLATE 0.2 MG/ML IJ SOLN
INTRAMUSCULAR | Status: DC | PRN
  Administered 2012-11-09: .2 mg via INTRAVENOUS

## 2012-11-09 MED ORDER — DEXTROSE-NACL 5-0.45 % IV SOLN: INTRAVENOUS | Status: DC

## 2012-11-09 MED ORDER — ONDANSETRON HCL 4 MG PO TABS: 4.0000 mg | ORAL_TABLET | ORAL | Status: DC | PRN

## 2012-11-09 MED ORDER — DEXAMETHASONE SODIUM PHOSPHATE 4 MG/ML IJ SOLN
INTRAMUSCULAR | Status: DC | PRN
  Administered 2012-11-09: 10 mg via INTRAVENOUS

## 2012-11-09 MED ORDER — OXYCODONE-ACETAMINOPHEN 5-325 MG/5ML PO SOLN: 5.0000 mL | ORAL | Status: DC | PRN

## 2012-11-09 MED ORDER — ROCURONIUM BROMIDE 100 MG/10ML IV SOLN
INTRAVENOUS | Status: DC | PRN
  Administered 2012-11-09: 20 mg via INTRAVENOUS

## 2012-11-09 MED ORDER — PROPOFOL 10 MG/ML IV BOLUS
INTRAVENOUS | Status: DC | PRN
  Administered 2012-11-09: 200 mg via INTRAVENOUS

## 2012-11-09 MED ORDER — HYDROMORPHONE HCL PF 1 MG/ML IJ SOLN
0.2500 mg | INTRAMUSCULAR | Status: DC | PRN
  Administered 2012-11-09 (×2): 0.5 mg via INTRAVENOUS

## 2012-11-09 MED ORDER — NEOSTIGMINE METHYLSULFATE 1 MG/ML IJ SOLN
INTRAMUSCULAR | Status: DC | PRN
  Administered 2012-11-09: 2 mg via INTRAVENOUS

## 2012-11-09 MED ORDER — KETOROLAC TROMETHAMINE 30 MG/ML IJ SOLN: 15.0000 mg | Freq: Once | INTRAMUSCULAR | Status: DC | PRN

## 2012-11-09 MED ORDER — ONDANSETRON HCL 4 MG/2ML IJ SOLN: 4.0000 mg | INTRAMUSCULAR | Status: DC | PRN

## 2012-11-09 MED ORDER — LIDOCAINE HCL (CARDIAC) 20 MG/ML IV SOLN
INTRAVENOUS | Status: DC | PRN
  Administered 2012-11-09: 75 mg via INTRAVENOUS

## 2012-11-09 MED ORDER — ONDANSETRON HCL 4 MG/2ML IJ SOLN: 4.0000 mg | Freq: Once | INTRAMUSCULAR | Status: DC | PRN

## 2012-11-09 MED ORDER — FENTANYL CITRATE 0.05 MG/ML IJ SOLN
INTRAMUSCULAR | Status: DC | PRN
  Administered 2012-11-09 (×2): 50 ug via INTRAVENOUS

## 2012-11-09 SURGICAL SUPPLY — 32 items
ADAPTER TUBE FLEX ULTRASET (MISCELLANEOUS) IMPLANT
CANISTER SUCTION 1200CC (MISCELLANEOUS) ×2 IMPLANT
CLOTH BEACON ORANGE TIMEOUT ST (SAFETY) ×2 IMPLANT
CONT SPEC 4OZ CLIKSEAL STRL BL (MISCELLANEOUS) IMPLANT
GAUZE SPONGE 4X4 12PLY STRL LF (GAUZE/BANDAGES/DRESSINGS) ×4 IMPLANT
GLOVE BIO SURGEON STRL SZ7 (GLOVE) ×1 IMPLANT
GLOVE ECLIPSE 8.0 STRL XLNG CF (GLOVE) ×2 IMPLANT
GOWN PREVENTION PLUS XLARGE (GOWN DISPOSABLE) IMPLANT
GOWN PREVENTION PLUS XXLARGE (GOWN DISPOSABLE) ×2 IMPLANT
GUARD TEETH (MISCELLANEOUS) ×1 IMPLANT
IV NS 500ML (IV SOLUTION)
IV NS 500ML BAXH (IV SOLUTION) IMPLANT
MARKER SKIN DUAL TIP RULER LAB (MISCELLANEOUS) ×1 IMPLANT
NDL HYPO 18GX1.5 BLUNT FILL (NEEDLE) IMPLANT
NDL SAFETY ECLIPSE 18X1.5 (NEEDLE) IMPLANT
NDL SPNL 22GX7 QUINCKE BK (NEEDLE) IMPLANT
NEEDLE HYPO 18GX1.5 BLUNT FILL (NEEDLE) IMPLANT
NEEDLE HYPO 18GX1.5 SHARP (NEEDLE) ×2
NEEDLE SPNL 22GX7 QUINCKE BK (NEEDLE) ×2 IMPLANT
NS IRRIG 1000ML POUR BTL (IV SOLUTION) ×2 IMPLANT
PATTIES SURGICAL .5 X3 (DISPOSABLE) ×2 IMPLANT
SHEET MEDIUM DRAPE 40X70 STRL (DRAPES) ×2 IMPLANT
SLEEVE SCD COMPRESS KNEE MED (MISCELLANEOUS) ×1 IMPLANT
SOLUTION BUTLER CLEAR DIP (MISCELLANEOUS) ×1 IMPLANT
SURGILUBE 2OZ TUBE FLIPTOP (MISCELLANEOUS) ×3 IMPLANT
SYR 5ML LL (SYRINGE) ×1 IMPLANT
SYR CONTROL 10ML LL (SYRINGE) IMPLANT
SYR TB 1ML LL NO SAFETY (SYRINGE) IMPLANT
TOWEL OR 17X24 6PK STRL BLUE (TOWEL DISPOSABLE) ×2 IMPLANT
TRAP SPECIMEN MUCOUS 40CC (MISCELLANEOUS) IMPLANT
TUBE CONNECTING 20X1/4 (TUBING) ×2 IMPLANT
YANKAUER SUCT BULB TIP NO VENT (SUCTIONS) IMPLANT

## 2012-11-09 NOTE — Interval H&P Note (Signed)
History and Physical Interval Note:  11/09/2012 10:27 AM  Jesus Brown  has presented today for surgery, with the diagnosis of DYSPHAGIA  The various methods of treatment have been discussed with the patient and family. After consideration of risks, benefits and other options for treatment, the patient has consented to  Procedure(s): DIRECT LARYNGOSCOPY (N/A) ESOPHAGOSCOPY POSSIBLE BIOPSY AND POSSIBLE BOTOX INJECTION CRICOPHARYNGEUS (N/A) as a surgical intervention .  The patient's history has been re-reviewed, patient re-examined, no change in status, stable for surgery.  I have re-reviewed the patient's chart and labs.  Questions were answered to the patient's satisfaction.     Jodi Marble

## 2012-11-09 NOTE — Anesthesia Postprocedure Evaluation (Signed)
  Anesthesia Post-op Note  Patient: Jesus Brown  Procedure(s) Performed: Procedure(s) with comments: DIRECT LARYNGOSCOPY (N/A) ESOPHAGOSCOPY POSSIBLE BIOPSY AND POSSIBLE BOTOX INJECTION CRICOPHARYNGEUS (N/A) - Esophagoscopy with botox injection  Patient Location: PACU  Anesthesia Type:General  Level of Consciousness: awake, alert  and oriented  Airway and Oxygen Therapy: Patient Spontanous Breathing and Patient connected to face mask oxygen  Post-op Pain: mild  Post-op Assessment: Post-op Vital signs reviewed  Post-op Vital Signs: Reviewed  Complications: No apparent anesthesia complications

## 2012-11-09 NOTE — Transfer of Care (Signed)
Immediate Anesthesia Transfer of Care Note  Patient: Jesus Brown  Procedure(s) Performed: Procedure(s) with comments: DIRECT LARYNGOSCOPY (N/A) ESOPHAGOSCOPY POSSIBLE BIOPSY AND POSSIBLE BOTOX INJECTION CRICOPHARYNGEUS (N/A) - Esophagoscopy with botox injection  Patient Location: PACU  Anesthesia Type:General  Level of Consciousness: awake, alert  and oriented  Airway & Oxygen Therapy: Patient Spontanous Breathing and Patient connected to face mask oxygen  Post-op Assessment: Report given to PACU RN and Post -op Vital signs reviewed and stable  Post vital signs: Reviewed and stable  Complications: No apparent anesthesia complications

## 2012-11-09 NOTE — H&P (View-Only) (Signed)
Jesus Brown, Jesus Brown 47 y.o., male [id number]     Chief Complaint:  Esophagospasm/ dysphagia  HPI: 6 year recheck for this 46 year old white male.   We saw him with swallowing difficulty following cervical fusion last time. A barium swallow was basically normal. Subsequently, he underwent posterior cervical fusion. He has also had lumbar spine fusion. He has degenerative disease in multiple locations and requires substantial narcotic pain medicine. We thought he might have reflux last time, and placed him on omeprazole twice daily which he continues to date.     Over the past years, he has had 5 or 6 episodes where food was lodged in his throat and required a trip to the emergency room. The most recent was April 2014. He required upper GI endoscopy by Dr. Paulita Fujita who noted a chunk of apple lodged at the cricopharyngeus.  He was unable to pass an adult-sized esophagoscope through the cricopharyngeus. Patient is sent here for further assessment. He is careful to cut his food into small bits, chews carefully, and does not drink alcohol. He has not had trouble with laryngospasm or obstruction, nor hoarseness or breathing difficulty. No neck masses. No change in weight. He is always a little careful about how he eats and swallows and drinks plenty  of fluid.  He does not smoke.  He does not wear dentures. He has not had any further imaging studies of his swallowing since 2008.  One month recheck. Preop visit. Barium swallow  showed some tertiary contractions in the distal cervical esophagus, a small polypoid mass in the RIGHT distal cervical esophagus, and no obvious stricture or achalasia. No apparent involvement from his prior cervical fusion plate.   We are planning to do esophagoscopy, possible dilatation of the cricopharyngeus, possible Botox instillation of the cricopharyngeus, and evaluation and possible biopsy of the distal esophageal lesion.  PMH: Past Medical History  Diagnosis Date  . Hypertension    . Back pain, chronic   . Neck pain, chronic   . GERD (gastroesophageal reflux disease)   . Arthritis     neck, knees    Surg Hx: Past Surgical History  Procedure Laterality Date  . Ankle surgery    . Back surgery    . Shoulder surgery    . Wrist surgery    . Knee surgery    . Hernia repair    . Esophagogastroduodenoscopy N/A 08/03/2012    Procedure: ESOPHAGOGASTRODUODENOSCOPY (EGD);  Surgeon: Arta Silence, MD;  Location: [facility] ENDOSCOPY;  Service: Endoscopy;  Laterality: N/A;    FHx:  No family history on file. SocHx:  reports that he has never smoked. He has never used smokeless tobacco. He reports that he does not drink alcohol or use illicit drugs.  ALLERGIES:  Allergies  Allergen Reactions  . Adhesive (Tape) Other (See Comments)    blisters  . Indocin (Indomethacin) Other (See Comments)    Burns stomach     (Not in a hospital admission)  No results found for this or any previous visit (from the past 48 hour(s)). No results found.  [id number]: Not feeling tired (fatigue).  No fever, no night sweats, and no recent weight loss. Head: No headache. Eyes: No eye symptoms. Otolaryngeal: No hearing loss, no earache, no tinnitus, and no purulent nasal discharge.  No nasal passage blockage (stuffiness), no snoring, no sneezing, no hoarseness, and no sore throat. Cardiovascular: No chest pain or discomfort  and no palpitations. Pulmonary: No dyspnea, no cough, and no wheezing. Gastrointestinal: No dysphagia  and  no heartburn.  No nausea, no abdominal pain, and no melena.  No diarrhea. Genitourinary: No dysuria. Endocrine: No muscle weakness. Musculoskeletal: No calf muscle cramps, no arthralgias, and no soft tissue swelling. Neurological: No dizziness, no fainting, no tingling, and no numbness. Psychological: No anxiety  and no depression. Skin: No rash.  There were no vitals taken for this visit.  PHYSICAL EXAM: He is stocky and perhaps slightly overweight. Mental  status is appropriate. He appears well in conversational speech.  He is not coughing or clearing his throat during her interview. Voice is clear and respirations unlabored through the nose. The head is atraumatic and neck supple. Cranial nerves intact. Ear canals are clear with normal drums. Anterior nose is moist and patent. Oral cavity and pharynx clear. Neck with surgical scars. No adenopathy.   Using the flexible laryngoscope, the nasopharynx is clear with normal eustachian tori. Oropharynx is clear with a narrow AP diameter at the base of tongue. Hypopharynx reveals mobile vocal cords with good airway. There is some postcricoid swelling. No pooling in the valleculae or piriforms. No obvious asymmetry.  Lungs: Clear to auscultation Heart: Regular rate and rhythm without murmurs Abdomen: Soft, active Extremities: Normal configuration Neurologic: Symmetric, intact.  Studies Reviewed:  I talked with him about the barium swallow findings. There is a small polypoid lesion in the distal cervical esophagus which warrants observation and probable biopsy. There are some tertiary contractions in the distal esophagus. This does not seem to be where he is having the food impactions.  at the time of cervical esophagoscopy, we will automatically dilate the cricopharyngeus, and I would consider for Botox paralysis of the cricopharyngeus to see if this improves his symptoms. He will maintain his antireflux measures.  I will see him back here preoperatively.    Assessment/Plan Cervical dysphagia (787.29) (R13.19).  These episodes with food getting stuck are probably more painful than they are dangerous.  I cannot tell from the report of the Dr. who did the endoscopy whether the upper sphincter is scarred tight, or is just having muscle spasms. It was not this way during your previous barium swallow in 2008.  We will do another barium swallow. I will call you with these results. We may need to stretch the tight  area, or consider Botox to relax the muscle. Continue with the omeprazole twice daily. Continue with your dietary measures including cutting up the food small, and chewing carefully.  We are planning to dilate  your esophagus and/or  put in Botox  to relax the tight sphincter. There was a small area on the barium swallow that we will try to examine and possibly biopsy.  This should be relatively easy on you.  I am giving you some liquid oxycodone for pain relief because it may be difficult for you to swallow pills for a couple of days.  The Botox may take as much as 2 weeks to work fully. I do want you back on solid foods as soon as comfortable because I think this will help keep things stretched open.  I would like to see you back here in the office 3 weeks after her surgery. No aspirin-containing products for 10 days before surgery.  OxyCODONE HCl - 5 MG/5ML Oral Solution;5-15 ml po q4h prn pain; 0000000; R0; Rx.    Jodi Marble [id number], 9:36 AM

## 2012-11-09 NOTE — Anesthesia Procedure Notes (Signed)
Procedure Name: Intubation Date/Time: 11/09/2012 10:40 AM Performed by: Melynda Ripple D Pre-anesthesia Checklist: Patient identified, Emergency Drugs available, Suction available and Patient being monitored Patient Re-evaluated:Patient Re-evaluated prior to inductionOxygen Delivery Method: Circle System Utilized Preoxygenation: Pre-oxygenation with 100% oxygen Intubation Type: IV induction and Cricoid Pressure applied Ventilation: Mask ventilation without difficulty Grade View: Grade II Tube type: Oral Number of attempts: 1 Airway Equipment and Method: stylet,  oral airway and Video-laryngoscopy Placement Confirmation: ETT inserted through vocal cords under direct vision,  positive ETCO2 and breath sounds checked- equal and bilateral Secured at: 23 cm Tube secured with: Tape Dental Injury: Teeth and Oropharynx as per pre-operative assessment

## 2012-11-09 NOTE — Op Note (Signed)
11/09/2012  11:30 AM    Fanny Dance  [id number]   Pre-Op Dx:  Dysphagia with esophagospasm, cervical esophageal lesion  Post-op Dx: Same  Proc: Direct laryngoscopy, esophagoscopy, cricopharyngeus Botox instillation   Surg:  Jodi Marble T MD  Anes:  GOT  EBL:  None  Comp:  None  Findings:  Post cricoid swelling. No intrinsic laryngeal issues or lesions.  Cricopharyngeus which easily admitted a full sized rigid esophagoscope.  No cervical esophageal mucosal lesion. Reflux fluid.  Procedure: With the patient in a comfortable supine position, general orotracheal anesthesia was induced without difficulty. At an appropriate level, the table was turned 90 the patient placed in a slight reverse Trendelenburg. A clean preparation and draping was accomplished. A rubber tooth guard was applied.  The anterior commissure laryngoscope was introduced and inspection of the larynx valleculae, piriform sinuses, and postcricoid pharynx was performed without significant findings. The scope was removed.  The cervical esophagoscope was lubricated and passed into the pharynx and easily through the cricopharyngeus into the cervical esophagus. Reflux fluid was identified, but no specific lesions. It did not appear necessary to dilate the cricopharyngeus and the scope passed without any difficulty. The scope was removed. The full length esophagoscope was introduced and passed to 40 cm again with reflux fluid but no specific findings. The scope was removed. The cervical esophagoscope was reintroduced and careful inspection of the distal cervical esophagus revealed no lesion. No biopsy was performed. The esophagoscope was removed.  Attempts were made to pass the Weerda esophagoscope was successfully. He had a somewhat narrow pharynx and limited neck extension and range of motion of the mandible. The anterior commissure laryngoscope was reintroduced but could not be passed all the way to the cricopharyngeus. A  Jackson laryngoscope was attempted with the same problem. Finally, the Hollinger laryngoscope was introduced which was marginally longer and under direct vision, the cricopharyngeus was identified. Botulinum toxin, approximately 75 units were applied into the cricopharyngeus muscle directly using a 7 inch 22-gauge spinal needle. Hemostasis was spontaneous.  At this point the procedure was completed. All scopes were removed including the tooth guard. The dental status was intact. The patient was returned to anesthesia, awakened, extubated, and transferred to recovery in stable condition.  Dispo:   PACU to home  Plan:  Liquid analgesics. Ice. Anticipate the botulinum toxin will take 1-2 weeks to take effect. We will see him back in the office at that time. We'll discuss his dysphagia and possibly repeat the barium swallow. Given low anticipated risk of postanesthetic or postsurgical complications, feel an outpatient venue is appropriate.  Tyson Alias MD

## 2012-11-09 NOTE — Anesthesia Preprocedure Evaluation (Signed)
Anesthesia Evaluation  Patient identified by MRN, date of birth, ID band Patient awake    Reviewed: Allergy & Precautions, H&P , NPO status , Patient's Chart, lab work & pertinent test results  Airway Mallampati: II TM Distance: >3 FB Neck ROM: Limited    Dental  (+) Teeth Intact and Dental Advisory Given   Pulmonary  breath sounds clear to auscultation        Cardiovascular Rhythm:Regular Rate:Normal     Neuro/Psych    GI/Hepatic   Endo/Other    Renal/GU      Musculoskeletal   Abdominal   Peds  Hematology   Anesthesia Other Findings   Reproductive/Obstetrics                           Anesthesia Physical Anesthesia Plan  ASA: III  Anesthesia Plan: General   Post-op Pain Management:    Induction: Intravenous  Airway Management Planned: Oral ETT  Additional Equipment:   Intra-op Plan:   Post-operative Plan: Extubation in OR  Informed Consent: I have reviewed the patients History and Physical, chart, labs and discussed the procedure including the risks, benefits and alternatives for the proposed anesthesia with the patient or authorized representative who has indicated his/her understanding and acceptance.   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

## 2012-11-11 ENCOUNTER — Encounter (HOSPITAL_BASED_OUTPATIENT_CLINIC_OR_DEPARTMENT_OTHER): Payer: Self-pay | Admitting: Otolaryngology

## 2012-11-30 ENCOUNTER — Other Ambulatory Visit: Payer: Self-pay | Admitting: Otolaryngology

## 2012-11-30 DIAGNOSIS — R1319 Other dysphagia: Secondary | ICD-10-CM

## 2012-12-02 ENCOUNTER — Ambulatory Visit
Admission: RE | Admit: 2012-12-02 | Discharge: 2012-12-02 | Disposition: A | Discharge status: Home / Self Care | Payer: BC Managed Care – PPO | Source: Ambulatory Visit | Attending: Otolaryngology | Admitting: Otolaryngology

## 2012-12-02 DIAGNOSIS — R1319 Other dysphagia: Secondary | ICD-10-CM

## 2013-02-27 ENCOUNTER — Emergency Department (HOSPITAL_COMMUNITY): Payer: BC Managed Care – PPO

## 2013-02-27 ENCOUNTER — Encounter (HOSPITAL_COMMUNITY): Payer: Self-pay | Admitting: Emergency Medicine

## 2013-02-27 ENCOUNTER — Emergency Department (HOSPITAL_COMMUNITY)
Admission: EM | Admit: 2013-02-27 | Discharge: 2013-02-27 | Disposition: A | Discharge status: Home / Self Care | Payer: BC Managed Care – PPO | Attending: Emergency Medicine | Admitting: Emergency Medicine

## 2013-02-27 DIAGNOSIS — M129 Arthropathy, unspecified: Secondary | ICD-10-CM | POA: Insufficient documentation

## 2013-02-27 DIAGNOSIS — I10 Essential (primary) hypertension: Secondary | ICD-10-CM | POA: Insufficient documentation

## 2013-02-27 DIAGNOSIS — S0993XA Unspecified injury of face, initial encounter: Secondary | ICD-10-CM | POA: Insufficient documentation

## 2013-02-27 DIAGNOSIS — G8929 Other chronic pain: Secondary | ICD-10-CM | POA: Insufficient documentation

## 2013-02-27 DIAGNOSIS — S0990XA Unspecified injury of head, initial encounter: Secondary | ICD-10-CM | POA: Insufficient documentation

## 2013-02-27 DIAGNOSIS — Z9889 Other specified postprocedural states: Secondary | ICD-10-CM | POA: Insufficient documentation

## 2013-02-27 DIAGNOSIS — Y92410 Unspecified street and highway as the place of occurrence of the external cause: Secondary | ICD-10-CM | POA: Insufficient documentation

## 2013-02-27 DIAGNOSIS — K219 Gastro-esophageal reflux disease without esophagitis: Secondary | ICD-10-CM | POA: Insufficient documentation

## 2013-02-27 DIAGNOSIS — Z79899 Other long term (current) drug therapy: Secondary | ICD-10-CM | POA: Insufficient documentation

## 2013-02-27 DIAGNOSIS — R42 Dizziness and giddiness: Secondary | ICD-10-CM | POA: Insufficient documentation

## 2013-02-27 DIAGNOSIS — Y9389 Activity, other specified: Secondary | ICD-10-CM | POA: Insufficient documentation

## 2013-02-27 DIAGNOSIS — M1710 Unilateral primary osteoarthritis, unspecified knee: Secondary | ICD-10-CM | POA: Insufficient documentation
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
## 2013-02-27 NOTE — ED Notes (Signed)
Pa has gone in to discuss results with pt and answer questions

## 2013-03-02 NOTE — ED Provider Notes (Signed)
Medical screening examination/treatment/procedure(s) were performed by non-physician practitioner and as supervising physician I was immediately available for consultation/collaboration.  EKG Interpretation   None        Nat Christen, MD 03/02/13 2045

## 2013-08-31 ENCOUNTER — Other Ambulatory Visit: Payer: Self-pay | Admitting: Specialist

## 2013-08-31 DIAGNOSIS — M542 Cervicalgia: Secondary | ICD-10-CM

## 2013-09-13 ENCOUNTER — Ambulatory Visit
Admission: RE | Admit: 2013-09-13 | Discharge: 2013-09-13 | Disposition: A | Discharge status: Home / Self Care | Payer: BC Managed Care – PPO | Source: Ambulatory Visit | Attending: Specialist | Admitting: Specialist

## 2013-09-13 DIAGNOSIS — M542 Cervicalgia: Secondary | ICD-10-CM

## 2014-03-18 ENCOUNTER — Other Ambulatory Visit (HOSPITAL_COMMUNITY): Payer: Self-pay | Admitting: Specialist

## 2014-03-21 NOTE — Pre-Procedure Instructions (Addendum)
Jesus Brown  03/21/2014   Your procedure is scheduled on:  Friday, December 18th  Report to [facility] Admitting at 830 AM.  Call this number if you have problems the morning of surgery: [phone]   Remember:   Do not eat food or drink liquids after midnight.   Take these medicines the morning of surgery with A SIP OF WATER: prilosec, ativan if needed, morphine   STOP Goody Headache, Vitamin C today   STOP/ Do not take Aspirin, Aleve, Naproxen, Advil, Ibuprofen, Motrin, Vitamins, Herbs, or Supplements starting today   Do not wear jewelry.  Do not wear lotions, powders, or perfume,deodorant.  Do not shave 48 hours prior to surgery. Men may shave face and neck.  Do not bring valuables to the hospital.  [facility] is not responsible for any belongings or valuables.               Contacts, dentures or bridgework may not be worn into surgery.  Leave suitcase in the car. After surgery it may be brought to your room.  For patients admitted to the hospital, discharge time is determined by your treatment team.               Patients discharged the day of surgery will not be allowed to drive home.  Please read over the following fact sheets that you were given: Pain Booklet, Coughing and Deep Breathing, MRSA Information and Surgical Site Infection Prevention   - Preparing for Surgery  Before surgery, you can play an important role.  Because skin is not sterile, your skin needs to be as free of germs as possible.  You can reduce the number of germs on you skin by washing with CHG (chlorahexidine gluconate) soap before surgery.  CHG is an antiseptic cleaner which kills germs and bonds with the skin to continue killing germs even after washing.  Please DO NOT use if you have an allergy to CHG or antibacterial soaps.  If your skin becomes reddened/irritated stop using the CHG and inform your nurse when you arrive at Short Stay.  Do not shave (including legs and  underarms) for at least 48 hours prior to the first CHG shower.  You may shave your face.  Please follow these instructions carefully:   1.  Shower with CHG Soap the night before surgery and the morning of Surgery.  2.  If you choose to wash your hair, wash your hair first as usual with your normal shampoo.  3.  After you shampoo, rinse your hair and body thoroughly to remove the shampoo.  4.  Use CHG as you would any other liquid soap.  You can apply CHG directly to the skin and wash gently with scrungie or a clean washcloth.  5.  Apply the CHG Soap to your body ONLY FROM THE NECK DOWN.  Do not use on open wounds or open sores.  Avoid contact with your eyes, ears, mouth and genitals (private parts).  Wash genitals (private parts) with your normal soap.  6.  Wash thoroughly, paying special attention to the area where your surgery will be performed.  7.  Thoroughly rinse your body with warm water from the neck down.  8.  DO NOT shower/wash with your normal soap after using and rinsing off the CHG Soap.  9.  Pat yourself dry with a clean towel.            10.  Wear clean pajamas.  11.  Place clean sheets on your bed the night of your first shower and do not sleep with pets.  Day of Surgery  Do not apply any lotions/deoderants the morning of surgery.  Please wear clean clothes to the hospital/surgery center. \

## 2014-03-22 ENCOUNTER — Encounter (HOSPITAL_COMMUNITY): Payer: Self-pay

## 2014-03-22 ENCOUNTER — Encounter (HOSPITAL_COMMUNITY)
Admission: RE | Admit: 2014-03-22 | Discharge: 2014-03-22 | Disposition: A | Discharge status: Home / Self Care | Payer: BC Managed Care – PPO | Source: Ambulatory Visit | Attending: Specialist | Admitting: Specialist

## 2014-03-22 DIAGNOSIS — K219 Gastro-esophageal reflux disease without esophagitis: Secondary | ICD-10-CM | POA: Diagnosis not present

## 2014-03-22 DIAGNOSIS — Z888 Allergy status to other drugs, medicaments and biological substances status: Secondary | ICD-10-CM | POA: Diagnosis not present

## 2014-03-22 DIAGNOSIS — M5023 Other cervical disc displacement, cervicothoracic region: Secondary | ICD-10-CM | POA: Diagnosis present

## 2014-03-22 DIAGNOSIS — J45909 Unspecified asthma, uncomplicated: Secondary | ICD-10-CM | POA: Diagnosis not present

## 2014-03-22 DIAGNOSIS — F419 Anxiety disorder, unspecified: Secondary | ICD-10-CM | POA: Diagnosis not present

## 2014-03-22 DIAGNOSIS — I10 Essential (primary) hypertension: Secondary | ICD-10-CM | POA: Diagnosis not present

## 2014-03-22 DIAGNOSIS — M4803 Spinal stenosis, cervicothoracic region: Secondary | ICD-10-CM | POA: Diagnosis not present

## 2014-03-22 HISTORY — DX: Unspecified asthma, uncomplicated: J45.909

## 2014-03-22 HISTORY — DX: Anxiety disorder, unspecified: F41.9

## 2014-03-22 LAB — COMPREHENSIVE METABOLIC PANEL
ALBUMIN: 3.9 g/dL (ref 3.5–5.2)
ALK PHOS: 80 U/L (ref 39–117)
ALT: 49 U/L (ref 0–53)
ANION GAP: 13 (ref 5–15)
AST: 31 U/L (ref 0–37)
BILIRUBIN TOTAL: 0.4 mg/dL (ref 0.3–1.2)
BUN: 16 mg/dL (ref 6–23)
CHLORIDE: 99 meq/L (ref 96–112)
CO2: 25 mEq/L (ref 19–32)
Calcium: 9.5 mg/dL (ref 8.4–10.5)
Creatinine, Ser: 0.76 mg/dL (ref 0.50–1.35)
GFR calc Af Amer: >90 mL/min (ref >90)
GFR calc non Af Amer: >90 mL/min (ref >90)
Glucose, Bld: 112 mg/dL — ABNORMAL HIGH (ref 70–99)
POTASSIUM: 3.7 meq/L (ref 3.7–5.3)
Sodium: 137 mEq/L (ref 137–147)
Total Protein: 7.4 g/dL (ref 6.0–8.3)

## 2014-03-22 LAB — CBC
HEMATOCRIT: 44.7 % (ref 39.0–52.0)
Hemoglobin: 15.7 g/dL (ref 13.0–17.0)
MCH: 29.4 pg (ref 26.0–34.0)
MCHC: 35.1 g/dL (ref 30.0–36.0)
MCV: 83.7 fL (ref 78.0–100.0)
Platelets: 227 10*3/uL (ref 150–400)
RBC: 5.34 MIL/uL (ref 4.22–5.81)
RDW: 13 % (ref 11.5–15.5)
WBC: 7.8 10*3/uL (ref 4.0–10.5)

## 2014-03-22 LAB — SURGICAL PCR SCREEN
MRSA, PCR: NEGATIVE
STAPHYLOCOCCUS AUREUS: POSITIVE — AB

## 2014-03-24 MED ORDER — CEFAZOLIN SODIUM-DEXTROSE 2-3 GM-% IV SOLR
2.0000 g | INTRAVENOUS | Status: AC
  Administered 2014-03-25: 2 g via INTRAVENOUS
  Filled 2014-03-24: qty 50

## 2014-03-24 NOTE — H&P (Addendum)
Jesus Brown is an 48 y.o. male.   Chief Complaint:  Neck pain and left UE radiculopathy HPI:  Patient with the above complaint due to left C8 radiculopathy.  Failed conservative treatment.  Seen in the clinic 23 Mar 2014.    Past Medical History  Diagnosis Date  . Hypertension   . Back pain, chronic   . Neck pain, chronic   . GERD (gastroesophageal reflux disease)   . Arthritis     neck, knees  . DDD (degenerative disc disease)   . Dysphagia   . Asthma   . Anxiety     mainly r/t pain    Past Surgical History  Procedure Laterality Date  . Ankle surgery    . Shoulder surgery  1999    rt and lt  . Wrist surgery      lt and rt  . Knee surgery      rt and left x2 each=4  . Esophagogastroduodenoscopy N/A 08/03/2012    Procedure: ESOPHAGOGASTRODUODENOSCOPY (EGD);  Surgeon: Jesus Silence, MD;  Location: [facility] ENDOSCOPY;  Service: Endoscopy;  Laterality: N/A;  . Back surgery  [id number]    lumb fusionx2  . Cervical fusion  [id number]    3 surgeries  . Hernia repair      ing-rt  . Tonsillectomy    . Direct laryngoscopy N/A 11/09/2012    Procedure: DIRECT LARYNGOSCOPY;  Surgeon: Jesus Marble, MD;  Location: [place];  Service: ENT;  Laterality: N/A;  . Esophagoscopy w/ botox injection N/A 11/09/2012    Procedure: ESOPHAGOSCOPY POSSIBLE BIOPSY AND POSSIBLE BOTOX INJECTION CRICOPHARYNGEUS;  Surgeon: Jesus Marble, MD;  Location: [place];  Service: ENT;  Laterality: N/A;  Esophagoscopy with botox injection    No family history on file. Social History:  reports that he has never smoked. He has never used smokeless tobacco. He reports that he does not drink alcohol or use illicit drugs.  Allergies:  Allergies  Allergen Reactions  . Adhesive [Tape] Other (See Comments)    blisters  . Indocin [Indomethacin] Other (See Comments)    Burns stomach    No prescriptions prior to admission    No results found for this or any previous visit (from the past  29 hour(s)). No results found.  Review of Systems  Constitutional: Negative.   Eyes: Negative.   Respiratory: Negative.   Cardiovascular: Negative.   Gastrointestinal: Negative.   Genitourinary: Negative.   Musculoskeletal: Positive for neck pain.  Skin: Negative.   Neurological: Positive for tingling.  Psychiatric/Behavioral: Negative.     There were no vitals taken for this visit. Physical Exam  Constitutional: He is oriented to person, place, and time. He appears well-developed and well-nourished.  HENT:  Head: Normocephalic and atraumatic.  Eyes: EOM are normal. Pupils are equal, round, and reactive to light.  Neck: Normal range of motion.  Cardiovascular: Normal rate and regular rhythm.   Respiratory: Effort normal and breath sounds normal.  GI: Soft. Bowel sounds are normal.  Musculoskeletal: Normal range of motion.  Neurological: He is alert and oriented to person, place, and time.  Skin: Skin is warm and dry.  Psychiatric: He has a normal mood and affect.     Assessment/Plan Neck pain and leftC8 radiculopathy.  Will proceed with left C8 foraminotomy.  Surgical procedure along with possible risks and complications discussed.  All questions answered and he wishes to proceed.    Jesus Brown,Jesus Brown 03/24/2014, 4:59 PM    [facility]  A Division of [employer] [facility]   [address] [place], [place] [address] Telephone: [phone]  Fax: [phone]     PATIENT: Jesus Brown, Jesus Brown   MR#: [id number]  DOB: 1965/09/07   Visit Date: 02/16/2014     CHIEF COMPLAINT:  Neck pain, pain into the left shoulder radiating down the left arm.  Mild discomfort in both shoulders.   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  He has been experiencing some persistent neck pain.  It has  been worsening over the last year to the point where he is having nearly constant pain into the left arm and this pain radiates down along the left distal anterior and medial aspect of the left distal arm and elbow area.  There is some radiation down into the volar and ulnar aspect of the forearm.  He experiences numbness and paresthesias in the left hand diffusely.  He has undergone a foraminal ESI left side C8 selective block and this provided him with some significant relief for several weeks.  The pain has recurred.  He experiences discomfort when he extends his neck and turns to the left side.     PHYSICAL EXAMINATION:  Clinically Jesus Brown' exam shows that he does have some mild stiffness in the left shoulder range of motion.  External rotation is limited to about 70 degrees and internal rotation is normal at 90 degrees.  Abduction strength is normal.  Biceps and triceps strength appear intact.  He has some weakness in the left hand abduction of fingers.  His finger extension strength appears intact.  Motor and sensory show some very mild intrinsic motor weakness in spread or abduction of the fingers.  Finger extension is slightly weak compared with the right.     RADIOGRAPHS/TESTS:  EMGs and nerve conduction studies show left C7 radicular findings and left sided C8 radiculopathy.  The patient's MRI scan of the cervical spine demonstrates a well decompressed central canal and foramen from C4 to C7.  There is foraminal entrapment at the left C8 to the C7-T1 level with a disk protrusion that may be impressing upon the left C8 nerve root.  The MRI demonstrates left sided foraminal entrapment affecting the left C8 nerve root with only temporary improvement with a corticosteroid injection and nerve root block.          ASSESSMENT:  I discussed the current situation with Jesus Brown.  I think that he has an area of stenosis involving the opening where the nerve exits on the left C8 nerve root.  He does have some overlap  of the C7 nerve root and he has pain in the left arm in both areas.  Previous areas of fusion do not show a great deal of nerve root compression so that it appears as though most of his problems do appear to be related to C8 radiculopathy with some prefixed type innervation pattern.  That is to say, innervation that may very well be overlapping between C7 and C8.   PLAN:  I have recommended that this patient have a left sided foraminotomy over the C8 nerve root and decompression of the nerve root.  I discussed fusion with Jesus Brown and I think that he has already had 3 levels fused in his cervical spine.  An additional fusion will only increase his  lever-arm across his neck and generate more force at the upper segments so that C4-5, his upper fusion site, and the level above will likely show increasing degenerative changes.  I think that it would be reasonable to first decompress here and see how he does, trying to avoid further fusion here.  I will go ahead and schedule him for a left C8 foraminotomy.  The risks of surgery including the risks of infection, bleeding and neurologic compromise were discussed with Mr. Karter and he wishes to proceed.     For additional information please see handwritten notes, reports, orders and prescriptions in this chart.      Jessy Oto, M.D.    Auto-Authenticated by Jessy Oto, M.D.  JEN/gm DD: 02/16/2014  DT: 02/21/2014

## 2014-03-25 ENCOUNTER — Encounter (HOSPITAL_COMMUNITY)
Admission: RE | Disposition: A | Discharge status: Home / Self Care | Payer: No Typology Code available for payment source | Source: Ambulatory Visit | Attending: Specialist

## 2014-03-25 ENCOUNTER — Encounter (HOSPITAL_COMMUNITY): Payer: Self-pay | Admitting: *Deleted

## 2014-03-25 ENCOUNTER — Ambulatory Visit (HOSPITAL_COMMUNITY): Payer: BC Managed Care – PPO | Admitting: Vascular Surgery

## 2014-03-25 ENCOUNTER — Ambulatory Visit (HOSPITAL_COMMUNITY): Payer: BC Managed Care – PPO

## 2014-03-25 ENCOUNTER — Ambulatory Visit (HOSPITAL_COMMUNITY): Payer: BC Managed Care – PPO | Admitting: Anesthesiology

## 2014-03-25 ENCOUNTER — Ambulatory Visit (HOSPITAL_COMMUNITY)
Admission: RE | Admit: 2014-03-25 | Discharge: 2014-03-25 | Disposition: A | Discharge status: Home / Self Care | Payer: BC Managed Care – PPO | Source: Ambulatory Visit | Attending: Specialist | Admitting: Specialist

## 2014-03-25 DIAGNOSIS — F419 Anxiety disorder, unspecified: Secondary | ICD-10-CM | POA: Insufficient documentation

## 2014-03-25 DIAGNOSIS — I10 Essential (primary) hypertension: Secondary | ICD-10-CM | POA: Diagnosis not present

## 2014-03-25 DIAGNOSIS — M5412 Radiculopathy, cervical region: Secondary | ICD-10-CM | POA: Diagnosis present

## 2014-03-25 DIAGNOSIS — M5023 Other cervical disc displacement, cervicothoracic region: Secondary | ICD-10-CM | POA: Insufficient documentation

## 2014-03-25 DIAGNOSIS — M47812 Spondylosis without myelopathy or radiculopathy, cervical region: Secondary | ICD-10-CM | POA: Diagnosis present

## 2014-03-25 DIAGNOSIS — M4722 Other spondylosis with radiculopathy, cervical region: Secondary | ICD-10-CM | POA: Diagnosis present

## 2014-03-25 DIAGNOSIS — M4803 Spinal stenosis, cervicothoracic region: Secondary | ICD-10-CM | POA: Diagnosis not present

## 2014-03-25 DIAGNOSIS — K219 Gastro-esophageal reflux disease without esophagitis: Secondary | ICD-10-CM | POA: Diagnosis not present

## 2014-03-25 DIAGNOSIS — Z888 Allergy status to other drugs, medicaments and biological substances status: Secondary | ICD-10-CM | POA: Insufficient documentation

## 2014-03-25 DIAGNOSIS — M5020 Other cervical disc displacement, unspecified cervical region: Secondary | ICD-10-CM

## 2014-03-25 DIAGNOSIS — J45909 Unspecified asthma, uncomplicated: Secondary | ICD-10-CM | POA: Insufficient documentation

## 2014-03-25 HISTORY — PX: POSTERIOR CERVICAL FUSION/FORAMINOTOMY: SHX5038

## 2014-03-25 SURGERY — POSTERIOR CERVICAL FUSION/FORAMINOTOMY LEVEL 1
Anesthesia: General | Site: Back

## 2014-03-25 MED ORDER — KETAMINE HCL 10 MG/ML IJ SOLN: INTRAMUSCULAR | Status: DC | PRN

## 2014-03-25 MED ORDER — VECURONIUM BROMIDE 10 MG IV SOLR
INTRAVENOUS | Status: DC | PRN
  Administered 2014-03-25: 4 mg via INTRAVENOUS
  Administered 2014-03-25 (×3): 2 mg via INTRAVENOUS

## 2014-03-25 MED ORDER — LACTATED RINGERS IV SOLN
INTRAVENOUS | Status: DC | PRN
  Administered 2014-03-25: 14:00:00 via INTRAVENOUS

## 2014-03-25 MED ORDER — KETAMINE HCL 10 MG/ML IJ SOLN
INTRAMUSCULAR | Status: DC | PRN
  Administered 2014-03-25: 52.5 mg via INTRAVENOUS

## 2014-03-25 MED ORDER — LIDOCAINE HCL (CARDIAC) 20 MG/ML IV SOLN
INTRAVENOUS | Status: AC
  Filled 2014-03-25: qty 5

## 2014-03-25 MED ORDER — LIDOCAINE HCL (CARDIAC) 20 MG/ML IV SOLN
INTRAVENOUS | Status: DC | PRN
  Administered 2014-03-25: 60 mg via INTRAVENOUS

## 2014-03-25 MED ORDER — GLYCOPYRROLATE 0.2 MG/ML IJ SOLN
INTRAMUSCULAR | Status: DC | PRN
  Administered 2014-03-25: 0.6 mg via INTRAVENOUS

## 2014-03-25 MED ORDER — METHOCARBAMOL 500 MG PO TABS: 500.0000 mg | ORAL_TABLET | Freq: Four times a day (QID) | ORAL | Status: DC | PRN

## 2014-03-25 MED ORDER — MIDAZOLAM HCL 2 MG/2ML IJ SOLN
INTRAMUSCULAR | Status: AC
Start: 2014-03-25 — End: 2014-03-25
  Filled 2014-03-25: qty 2

## 2014-03-25 MED ORDER — SODIUM CHLORIDE 0.9 % IJ SOLN
INTRAMUSCULAR | Status: AC
  Filled 2014-03-25: qty 10

## 2014-03-25 MED ORDER — THROMBIN 20000 UNITS EX KIT
PACK | CUTANEOUS | Status: DC | PRN
  Administered 2014-03-25: 20 mL via TOPICAL

## 2014-03-25 MED ORDER — KETAMINE HCL 100 MG/ML IJ SOLN
INTRAMUSCULAR | Status: AC
  Filled 2014-03-25: qty 1

## 2014-03-25 MED ORDER — MIDAZOLAM HCL 5 MG/5ML IJ SOLN
INTRAMUSCULAR | Status: DC | PRN
  Administered 2014-03-25: 2 mg via INTRAVENOUS

## 2014-03-25 MED ORDER — EPHEDRINE SULFATE 50 MG/ML IJ SOLN
INTRAMUSCULAR | Status: DC | PRN
  Administered 2014-03-25: 5 mg via INTRAVENOUS

## 2014-03-25 MED ORDER — ONDANSETRON HCL 4 MG/2ML IJ SOLN
INTRAMUSCULAR | Status: DC | PRN
  Administered 2014-03-25: 4 mg via INTRAVENOUS

## 2014-03-25 MED ORDER — NEOSTIGMINE METHYLSULFATE 10 MG/10ML IV SOLN
INTRAVENOUS | Status: DC | PRN
  Administered 2014-03-25: 5 mg via INTRAVENOUS

## 2014-03-25 MED ORDER — CHLORHEXIDINE GLUCONATE 4 % EX LIQD
60.0000 mL | Freq: Once | CUTANEOUS | Status: DC
  Filled 2014-03-25: qty 60

## 2014-03-25 MED ORDER — THROMBIN 20000 UNITS EX SOLR
CUTANEOUS | Status: AC
  Filled 2014-03-25: qty 20000

## 2014-03-25 MED ORDER — SUFENTANIL CITRATE 50 MCG/ML IV SOLN
INTRAVENOUS | Status: DC | PRN
  Administered 2014-03-25: 5 ug via INTRAVENOUS
  Administered 2014-03-25 (×2): 10 ug via INTRAVENOUS
  Administered 2014-03-25 (×2): 5 ug via INTRAVENOUS

## 2014-03-25 MED ORDER — CHLORHEXIDINE GLUCONATE 4 % EX LIQD: 60.0000 mL | Freq: Once | CUTANEOUS | Status: DC

## 2014-03-25 MED ORDER — BUPIVACAINE LIPOSOME 1.3 % IJ SUSP
20.0000 mL | INTRAMUSCULAR | Status: DC
  Filled 2014-03-25: qty 20

## 2014-03-25 MED ORDER — KETAMINE HCL 100 MG/ML IJ SOLN
500.0000 mg | INTRAMUSCULAR | Status: DC | PRN
  Administered 2014-03-25: 10 ug/kg/min via INTRAVENOUS

## 2014-03-25 MED ORDER — BUPIVACAINE HCL 0.5 % IJ SOLN
INTRAMUSCULAR | Status: DC | PRN
  Administered 2014-03-25: 20 mL

## 2014-03-25 MED ORDER — 0.9 % SODIUM CHLORIDE (POUR BTL) OPTIME
TOPICAL | Status: DC | PRN
  Administered 2014-03-25: 1000 mL

## 2014-03-25 MED ORDER — MORPHINE SULFATE 15 MG PO TABS: 15.0000 mg | ORAL_TABLET | ORAL | Status: DC | PRN

## 2014-03-25 MED ORDER — HYDROMORPHONE HCL 1 MG/ML IJ SOLN
INTRAMUSCULAR | Status: AC
  Filled 2014-03-25: qty 1

## 2014-03-25 MED ORDER — SUCCINYLCHOLINE CHLORIDE 20 MG/ML IJ SOLN
INTRAMUSCULAR | Status: DC | PRN
  Administered 2014-03-25: 140 mg via INTRAVENOUS

## 2014-03-25 MED ORDER — SUFENTANIL CITRATE 50 MCG/ML IV SOLN
INTRAVENOUS | Status: AC
  Filled 2014-03-25: qty 1

## 2014-03-25 MED ORDER — METHOCARBAMOL 1000 MG/10ML IJ SOLN: 500.0000 mg | Freq: Four times a day (QID) | INTRAVENOUS | Status: DC | PRN

## 2014-03-25 MED ORDER — LACTATED RINGERS IV SOLN: INTRAVENOUS | Status: DC

## 2014-03-25 MED ORDER — PHENYLEPHRINE HCL 10 MG/ML IJ SOLN
INTRAMUSCULAR | Status: DC | PRN
  Administered 2014-03-25 (×3): 40 ug via INTRAVENOUS
  Administered 2014-03-25: 80 ug via INTRAVENOUS
  Administered 2014-03-25: 40 ug via INTRAVENOUS

## 2014-03-25 MED ORDER — DEXTROSE 5 % IV SOLN
10.0000 mg | INTRAVENOUS | Status: DC | PRN
  Administered 2014-03-25: 20 ug/min via INTRAVENOUS

## 2014-03-25 MED ORDER — BUPIVACAINE LIPOSOME 1.3 % IJ SUSP
INTRAMUSCULAR | Status: DC | PRN
  Administered 2014-03-25: 20 mL

## 2014-03-25 MED ORDER — NEOSTIGMINE METHYLSULFATE 10 MG/10ML IV SOLN
INTRAVENOUS | Status: AC
  Filled 2014-03-25: qty 1

## 2014-03-25 MED ORDER — HYDROMORPHONE HCL 1 MG/ML IJ SOLN
0.2500 mg | INTRAMUSCULAR | Status: DC | PRN
  Administered 2014-03-25 (×2): 0.5 mg via INTRAVENOUS

## 2014-03-25 MED ORDER — LACTATED RINGERS IV SOLN
INTRAVENOUS | Status: DC | PRN
  Administered 2014-03-25 (×2): via INTRAVENOUS

## 2014-03-25 MED ORDER — ONDANSETRON HCL 4 MG/2ML IJ SOLN
INTRAMUSCULAR | Status: AC
  Filled 2014-03-25: qty 2

## 2014-03-25 MED ORDER — DEXAMETHASONE SODIUM PHOSPHATE 10 MG/ML IJ SOLN
INTRAMUSCULAR | Status: DC | PRN
  Administered 2014-03-25: 10 mg via INTRAVENOUS

## 2014-03-25 MED ORDER — GLYCOPYRROLATE 0.2 MG/ML IJ SOLN
INTRAMUSCULAR | Status: AC
  Filled 2014-03-25: qty 3

## 2014-03-25 MED ORDER — PROPOFOL 10 MG/ML IV BOLUS
INTRAVENOUS | Status: DC | PRN
  Administered 2014-03-25: 200 mg via INTRAVENOUS

## 2014-03-25 MED ORDER — ARTIFICIAL TEARS OP OINT
TOPICAL_OINTMENT | OPHTHALMIC | Status: DC | PRN
  Administered 2014-03-25: 1 via OPHTHALMIC

## 2014-03-25 SURGICAL SUPPLY — 53 items
BIT DRILL NEURO 2X3.1 SFT TUCH (MISCELLANEOUS) IMPLANT
COLLAR CERV LO CONTOUR FIRM DE (SOFTGOODS) ×2 IMPLANT
CORDS BIPOLAR (ELECTRODE) ×2 IMPLANT
COVER SURGICAL LIGHT HANDLE (MISCELLANEOUS) ×2 IMPLANT
DRAPE C-ARM 42X72 X-RAY (DRAPES) ×4 IMPLANT
DRAPE LAPAROTOMY T 102X78X121 (DRAPES) ×2 IMPLANT
DRAPE MICROSCOPE LEICA (MISCELLANEOUS) IMPLANT
DRAPE PROXIMA HALF (DRAPES) ×4 IMPLANT
DRAPE SURG 17X23 STRL (DRAPES) ×4 IMPLANT
DRILL NEURO 2X3.1 SOFT TOUCH (MISCELLANEOUS) ×2
DRSG MEPILEX BORDER 4X4 (GAUZE/BANDAGES/DRESSINGS) ×1 IMPLANT
DRSG PAD ABDOMINAL 8X10 ST (GAUZE/BANDAGES/DRESSINGS) ×4 IMPLANT
DURAPREP 26ML APPLICATOR (WOUND CARE) ×2 IMPLANT
ELECT BLADE 4.0 EZ CLEAN MEGAD (MISCELLANEOUS) ×2
ELECT CAUTERY BLADE 6.4 (BLADE) ×1 IMPLANT
ELECT COATED BLADE 2.86 ST (ELECTRODE) ×2 IMPLANT
ELECT REM PT RETURN 9FT ADLT (ELECTROSURGICAL) ×2
ELECTRODE BLDE 4.0 EZ CLN MEGD (MISCELLANEOUS) IMPLANT
ELECTRODE REM PT RTRN 9FT ADLT (ELECTROSURGICAL) ×1 IMPLANT
GLOVE BIOGEL PI IND STRL 7.5 (GLOVE) ×1 IMPLANT
GLOVE BIOGEL PI INDICATOR 7.5 (GLOVE) ×2
GLOVE ECLIPSE 7.0 STRL STRAW (GLOVE) ×2 IMPLANT
GLOVE ECLIPSE 9.0 STRL (GLOVE) ×2 IMPLANT
GLOVE SURG 8.5 LATEX PF (GLOVE) ×2 IMPLANT
GLOVE SURG ORTHO 8.0 STRL STRW (GLOVE) ×1 IMPLANT
GLOVE SURG SS PI 6.5 STRL IVOR (GLOVE) ×1 IMPLANT
GOWN STRL REUS W/ TWL LRG LVL3 (GOWN DISPOSABLE) ×2 IMPLANT
GOWN STRL REUS W/TWL 2XL LVL3 (GOWN DISPOSABLE) ×2 IMPLANT
GOWN STRL REUS W/TWL LRG LVL3 (GOWN DISPOSABLE) ×4
KIT BASIN OR (CUSTOM PROCEDURE TRAY) ×2 IMPLANT
KIT ROOM TURNOVER OR (KITS) ×2 IMPLANT
MANIFOLD NEPTUNE II (INSTRUMENTS) ×2 IMPLANT
NDL SPNL 18GX3.5 QUINCKE PK (NEEDLE) IMPLANT
NEEDLE SPNL 18GX3.5 QUINCKE PK (NEEDLE) ×2 IMPLANT
NS IRRIG 1000ML POUR BTL (IV SOLUTION) ×2 IMPLANT
PACK ORTHO CERVICAL (CUSTOM PROCEDURE TRAY) ×2 IMPLANT
PAD ARMBOARD 7.5X6 YLW CONV (MISCELLANEOUS) ×4 IMPLANT
PATTIES SURGICAL .25X.25 (GAUZE/BANDAGES/DRESSINGS) ×2 IMPLANT
SLEEVE SURGEON STRL (DRAPES) ×1 IMPLANT
STAPLER VISISTAT 35W (STAPLE) ×1 IMPLANT
SUT ETHIBOND CT1 BRD #0 30IN (SUTURE) ×2 IMPLANT
SUT VIC AB 0 CT1 27 (SUTURE)
SUT VIC AB 0 CT1 27XBRD ANBCTR (SUTURE) IMPLANT
SUT VIC AB 2-0 CT1 27 (SUTURE) ×2
SUT VIC AB 2-0 CT1 TAPERPNT 27 (SUTURE) IMPLANT
SUT VIC AB 2-0 UR6 27 (SUTURE) IMPLANT
SUT VICRYL 0 UR6 27IN ABS (SUTURE) ×2 IMPLANT
SUT VICRYL 4-0 PS2 18IN ABS (SUTURE) ×2 IMPLANT
TAPE CLOTH 4X10 WHT NS (GAUZE/BANDAGES/DRESSINGS) ×2 IMPLANT
TOWEL OR 17X24 6PK STRL BLUE (TOWEL DISPOSABLE) ×2 IMPLANT
TOWEL OR 17X26 10 PK STRL BLUE (TOWEL DISPOSABLE) ×2 IMPLANT
TRAY FOLEY CATH 16FRSI W/METER (SET/KITS/TRAYS/PACK) IMPLANT
WATER STERILE IRR 1000ML POUR (IV SOLUTION) ×2 IMPLANT

## 2014-03-25 NOTE — Interval H&P Note (Signed)
History and Physical Interval Note:  03/25/2014 12:59 PM  Jesus Brown  has presented today for surgery, with the diagnosis of Left C7-T1 foraminal stenosis with herniated nucleus pulposus  The various methods of treatment have been discussed with the patient and family. After consideration of risks, benefits and other options for treatment, the patient has consented to  Procedure(s): LEFT C7-T1 FORAMINOTOMY (N/A) as a surgical intervention .  The patient's history has been reviewed, patient examined, no change in status, stable for surgery.  I have reviewed the patient's chart and labs.  Questions were answered to the patient's satisfaction.     NITKA,Bradley E

## 2014-03-25 NOTE — Anesthesia Procedure Notes (Signed)
Procedure Name: Intubation Date/Time: 03/25/2014 1:21 PM Performed by: Luciana Axe K Pre-anesthesia Checklist: Patient identified, Suction available, Patient being monitored, Emergency Drugs available and Timeout performed Patient Re-evaluated:Patient Re-evaluated prior to inductionOxygen Delivery Method: Circle system utilized Preoxygenation: Pre-oxygenation with 100% oxygen Intubation Type: IV induction Ventilation: Mask ventilation without difficulty Grade View: Grade II Tube type: Oral Tube size: 7.5 mm Number of attempts: 1 Airway Equipment and Method: Stylet and Video-laryngoscopy Placement Confirmation: positive ETCO2,  CO2 detector and breath sounds checked- equal and bilateral Secured at: 22 cm Tube secured with: Tape Dental Injury: Teeth and Oropharynx as per pre-operative assessment  Difficulty Due To: Difficulty was anticipated and Difficult Airway- due to reduced neck mobility Future Recommendations: Recommend- induction with short-acting agent, and alternative techniques readily available

## 2014-03-25 NOTE — Anesthesia Postprocedure Evaluation (Signed)
Anesthesia Post Note  Patient: Jesus Brown  Procedure(s) Performed: Procedure(s) (LRB): LEFT C7-T1 FORAMINOTOMY (N/A)  Anesthesia type: General  Patient location: PACU  Post pain: Pain level controlled  Post assessment: Post-op Vital signs reviewed  Last Vitals: BP 118/68 mmHg  Pulse 88  Temp(Src) 36.3 C (Oral)  Resp 24  Ht 5\' 8"  (1.727 m)  Wt 231 lb (104.781 kg)  BMI 35.13 kg/m2  SpO2 96%  Post vital signs: Reviewed  Level of consciousness: sedated  Complications: No apparent anesthesia complications

## 2014-03-25 NOTE — Progress Notes (Addendum)
Talked to Dr. Merrily Pew call for Dr. Louanne Skye, states okay to discharge patient to home.

## 2014-03-25 NOTE — Transfer of Care (Signed)
Immediate Anesthesia Transfer of Care Note  Patient: Jesus Brown  Procedure(s) Performed: Procedure(s): LEFT C7-T1 FORAMINOTOMY (N/A)  Patient Location: PACU  Anesthesia Type:General  Level of Consciousness: awake, oriented and patient cooperative  Airway & Oxygen Therapy: Patient Spontanous Breathing and Patient connected to face mask oxygen  Post-op Assessment: Report given to PACU RN and Post -op Vital signs reviewed and stable  Post vital signs: Reviewed  Complications: No apparent anesthesia complications

## 2014-03-25 NOTE — Brief Op Note (Addendum)
03/25/2014  4:02 PM  PATIENT:  Bennie Hind  48 y.o. male  PRE-OPERATIVE DIAGNOSIS:  Left C7-T1 foraminal stenosis with herniated nucleus pulposus  POST-OPERATIVE DIAGNOSIS:  Left C7-T1 foraminal stenosis with herniated nucleus pulposus  PROCEDURE:  Procedure(s): LEFT C7-T1 FORAMINOTOMY (N/A)  SURGEON:  Surgeon(s) and Role:    * Jessy Oto, MD - Primary  PHYSICIAN ASSISTANT: Benjiman Core, PA-C  ANESTHESIA:   local and general Dr. Ola Spurr.  EBL:  Total I/O In: 1500 [I.V.:1500] Out: 200 [Blood:200]  BLOOD ADMINISTERED:none  DRAINS: Urinary Catheter (Foley) In and Out at the end of the case.  LOCAL MEDICATIONS USED:  MARCAINE 1/2% 1:1 EXPAREL 1.3%, Amount: 20 ml   SPECIMEN:  No Specimen  DISPOSITION OF SPECIMEN:  N/A  COUNTS:  YES  TOURNIQUET:  * No tourniquets in log *  DICTATION: .Dragon Dictation  PLAN OF CARE: Admit for overnight observation  PATIENT DISPOSITION:  PACU - hemodynamically stable.   Delay start of Pharmacological VTE agent (>24hrs) due to surgical blood loss or risk of bleeding: yes

## 2014-03-25 NOTE — Discharge Instructions (Signed)
No lifting greater than 10 lbs. °Avoid bending, stooping and twisting. °Walking in house for first week then may start to get out slowly increasing activity using arms. °Keep incision dry for 3 days, may use tegaderm or similar water impervious dressing. °Avoid overhead use of arms and overhead lifting. °Wear collar for comfort. °Use ice as needed for comfort. °

## 2014-03-25 NOTE — Anesthesia Preprocedure Evaluation (Addendum)
Anesthesia Evaluation  Patient identified by MRN, date of birth, ID band Patient awake    Reviewed: Allergy & Precautions, H&P , NPO status , Patient's Chart, lab work & pertinent test results  History of Anesthesia Complications Negative for: history of anesthetic complications  Airway Mallampati: II  TM Distance: >3 FB Neck ROM: Limited    Dental no notable dental hx. (+) Teeth Intact, Dental Advisory Given   Pulmonary asthma ,  breath sounds clear to auscultation  Pulmonary exam normal       Cardiovascular hypertension, Pt. on medications Rhythm:Regular Rate:Normal     Neuro/Psych Anxiety negative neurological ROS     GI/Hepatic Neg liver ROS, GERD-  Medicated and Controlled,  Endo/Other  negative endocrine ROS  Renal/GU negative Renal ROS  negative genitourinary   Musculoskeletal  (+) Arthritis -,   Abdominal   Peds  Hematology negative hematology ROS (+)   Anesthesia Other Findings   Reproductive/Obstetrics negative OB ROS                          Anesthesia Physical Anesthesia Plan  ASA: II  Anesthesia Plan: General   Post-op Pain Management:    Induction: Intravenous  Airway Management Planned: Oral ETT  Additional Equipment:   Intra-op Plan:   Post-operative Plan: Extubation in OR  Informed Consent: I have reviewed the patients History and Physical, chart, labs and discussed the procedure including the risks, benefits and alternatives for the proposed anesthesia with the patient or authorized representative who has indicated his/her understanding and acceptance.   Dental advisory given  Plan Discussed with: CRNA  Anesthesia Plan Comments:         Anesthesia Quick Evaluation

## 2014-03-25 NOTE — Op Note (Addendum)
03/25/2014  4:07 PM  PATIENT:  Jesus Brown  48 y.o. male  MRN: [id number]  OPERATIVE REPORT  PRE-OPERATIVE DIAGNOSIS:  Left C7-T1 foraminal stenosis with herniated nucleus pulposus  POST-OPERATIVE DIAGNOSIS:  Left C7-T1 foraminal stenosis with herniated nucleus pulposus  PROCEDURE:  Procedure(s): LEFT C7-T1 FORAMINOTOMY    SURGEON:  Jessy Oto, MD     ASSISTANT:  Benjiman Core, PA-C  (Present throughout the entire procedure and necessary for completion of procedure in a timely manner)     ANESTHESIA:  General,supplemented with MARCAINE 1/2% 1:1 EXPAREL 1.3%, Amount: 20 ml Dr. Ola Spurr.  EBL: 200 cc.  DRAINS: In and Out foley placed at the end of the case.    COMPLICATIONS:  None.   PROCEDURE:The patient was met in the holding area, and the appropriate left C7-T1 cervical level identified and marked with "x" and my initials. All questions were answered and informed consent signed.   The patient was then transported to OR. The patient was then placed under general anesthesia without difficulty. A foley catheter was placed sterilely by OR nursing personnel. and transferred to the operating room table prone position [place] horseshoe with Oralia Manis. All pressure points well-padded PAS stockings.Shoulders taped down and skin over the posterior inferior aspect of the neck place in traction to decrease skin folds. The patient received appropriate preoperative antibiotic 3 grams ancef. prophylaxis.Time-out procedure was called and correct.   Sterile prep with DuraPrep and draped in the usual manner the shoulders were taped downwards and skin traction over the skin of the neck. Following DuraPrep draped in the usual manner. After timeout protocol incision was made approximately C7to D1 in the midline. This following infiltration of skin and subcutaneous layers with MARCAINE 1/2% 1:1 EXPAREL 1.3%, Amount: 20 ml. Incision carried through skin and subcutaneous layers using 10 blade  scalpel and electrocautery down to the level ligamentum nuchae. Incision made along the lateral aspect of the spinous process of C6. Clamp then placed at the spinous process of C6. Intraoperative C-arm fluoroscopy identified the clamp at the C6 spinous process level as identified by posterior wiring at the C5-6 level. Then a 2-0 vicryl placed at the spinous process of C7. Electrocautery then used to carefully incise the cervical muscles off the left lateral aspect of the spinous process of C7 and T1. Dividing the  spinous muscles off of the inferior aspect lamina at C7 exposing the C7-T1 posterior aspect of the interlaminar space. The magnification headlamp were used during this portion procedure. Boss McCollough retractor was inserted. High-speed bur was used to remove a small portion of bone from the inferior aspect of lamina of C7 and the medial 30% of the inferior articular process of C7. Further thinning the superior aspect of the lamina right T1. A 2 mm Kerrison was then used to remove him from superior aspect of the lamina T1 and the medial aspect of the intra-articular process of C7 the 20%, exposing the superior articular process of T1. A 1 mm Kerrison was removed bone off the medial aspect of the superior articular process of T1 resecting 20% of the medial aspect of the superior articular process of T1. Ligamentum flavum then easily lifted superiorly  electrocautery unit cauterizing epidural veins deep to the ligamentum flavum  then resecting the ligamentum flavum. The operating room microscope was draped sterilely and brought into the field. Under the operating room microscope the epidural vein layer overlying the posterior aspect of the thecal sac and the C8 nerve root was  then carefully lifted using a micro-titanium nerve hook then cauterized using bipolar electrocautery a # 15 blade scalpel then used to incise this overlying the C8 nerve root releasing the vascular leash a backward angle 3-0  microcurette then used to remove a small portion of bone off the superior and medial aspect of the pedicle further mobilizing the C8 nerve root bipolar electrocautery to control all bleeding within the axillary area of the C8 nerve. Bone wax was applied to bleeding cancellus bone surfaces are excellent hemostasis obtained  The disc explored using a Penfield 4 found to be protruded. 11 blade scalpel used to incise the disc mild disc material found  Not to be herniated and uncovertebral joint found to be prominent resulting in right C8 foramenal narrowing. Following this then hemostasis was obtained using thrombin-soaked Gelfoam and micro-pledgettes. When complete hemostasis was obtained all gel foam was removed the nerve hook could be easily passed out the neuroforamen without the lateral aspect of the T1 pedicle demonstrate the C8 neuroforamen completely decompressed. Irrigation was carried out no active bleeding was present. The incision was closed by approximating the ligamentum nuchae with #1 ethibond sutures. The subcutaneous layers approximated with interrupted 0 Vicryl suture more superficial layers with interrupted 2-0 Vicryl sutures and the skin closed with interrupted 4-0 Vicryl sutures. Dermabond was applied then MedPlex bandage. Soft cervical collar placed.  All instrument and sponge counts were correct. Patient was then returned to supine position on her stretcher. Returned to recovery room in satisfactory condition.   Physician assistant's responsibilities: Benjiman Core PA-C performed the duties of assistant physician and surgeon during this case present from the beginning of the case to the end of the case. He assisted with careful retraction of neural structures suctioning about her elements including cervical cord and C8 nerve root. Performed closure of the incision on the ligamentum nuchae to the skin and application of dressing. He assisted in positioning the patient had removal the patient from  the OR table to her stretcher.     NITKA,Nykolas E 03/25/2014, 4:07 PM

## 2014-03-28 MED FILL — Thrombin For Soln 20000 Unit: CUTANEOUS | Qty: 1 | Status: AC

## 2014-03-29 ENCOUNTER — Encounter (HOSPITAL_COMMUNITY): Payer: Self-pay | Admitting: Specialist

## 2015-09-08 ENCOUNTER — Other Ambulatory Visit (HOSPITAL_BASED_OUTPATIENT_CLINIC_OR_DEPARTMENT_OTHER): Payer: BLUE CROSS/BLUE SHIELD

## 2015-09-08 ENCOUNTER — Ambulatory Visit: Payer: Medicare Other

## 2015-09-08 ENCOUNTER — Encounter: Payer: Self-pay | Admitting: Hematology & Oncology

## 2015-09-08 ENCOUNTER — Ambulatory Visit (HOSPITAL_BASED_OUTPATIENT_CLINIC_OR_DEPARTMENT_OTHER): Payer: BLUE CROSS/BLUE SHIELD | Admitting: Hematology & Oncology

## 2015-09-08 VITALS — BP 121/70 | HR 91 | Temp 97.9°F | Resp 18 | Ht 68.0 in | Wt 234.0 lb

## 2015-09-08 DIAGNOSIS — D751 Secondary polycythemia: Secondary | ICD-10-CM

## 2015-09-08 DIAGNOSIS — E291 Testicular hypofunction: Secondary | ICD-10-CM | POA: Diagnosis not present

## 2015-09-08 DIAGNOSIS — E349 Endocrine disorder, unspecified: Secondary | ICD-10-CM

## 2015-09-08 HISTORY — DX: Secondary polycythemia: D75.1

## 2015-09-08 HISTORY — DX: Endocrine disorder, unspecified: E34.9

## 2015-09-08 LAB — CBC WITH DIFFERENTIAL (CANCER CENTER ONLY)
BASO#: 0 10*3/uL (ref 0.0–0.2)
BASO%: 0.4 % (ref 0.0–2.0)
EOS ABS: 0.3 10*3/uL (ref 0.0–0.5)
EOS%: 3.1 % (ref 0.0–7.0)
HCT: 49.2 % (ref 38.7–49.9)
HGB: 17.6 g/dL — ABNORMAL HIGH (ref 13.0–17.1)
LYMPH#: 3 10*3/uL (ref 0.9–3.3)
LYMPH%: 32.2 % (ref 14.0–48.0)
MCH: 30.6 pg (ref 28.0–33.4)
MCHC: 35.8 g/dL (ref 32.0–35.9)
MCV: 86 fL (ref 82–98)
MONO#: 0.7 10*3/uL (ref 0.1–0.9)
MONO%: 7 % (ref 0.0–13.0)
NEUT#: 5.3 10*3/uL (ref 1.5–6.5)
NEUT%: 57.3 % (ref 40.0–80.0)
PLATELETS: 223 10*3/uL (ref 145–400)
RBC: 5.75 10*6/uL — ABNORMAL HIGH (ref 4.20–5.70)
RDW: 14.1 % (ref 11.1–15.7)
WBC: 9.3 10*3/uL (ref 4.0–10.0)

## 2015-09-08 NOTE — Progress Notes (Signed)
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 : My blood count has been too high because of testosterone.  HPI: Jesus Brown is a very nice 50 year old white male. He was in the [employer]. He was in for 12 years. He had a be medically discharged because of significant injuries that required multiple, multiple surgeries. He currently is disabled.  He is followed at the [facility].  Again, he has a lot of surgeries. He's had about 20 surgeries. He is on chronic pain medications. Per graph he has had incredibly low testosterone levels. He has required supplemental testosterone because he has felt so fatigued and so "drained".  He had been getting testosterone injections every couple weeks. He was finally able to get onto testosterone gel.  Since starting the testosterone injections, his Red cell count has been quite high. He's had a erythrocytosis because of this.  He is on baby aspirin. He does not take it  Back in January of this sugar, a CBC was done which showed a white cell count 8.5. Hemoglobin was 18.9 hematocrit 54. Platelet count 231,000. MCV was 85.  In March, his hemoglobin was 18 and hematocrit was 51.6. MCV was 85. Platelet count 210,000. Had a normal white cell differential. His lites lites were all normal.  About one month ago, his hemoglobin was 18.3 and hematocrit 51.7 .  He currently is on androgen gel. He thinks that his hemoglobin is stabilizing a little bit.  He feels so much better with the testosterone supplementation.  He's had no problem with headache. Has been no pain in his hands or feet. He's had no chest wall pain. He's had no cough or shortness of breath. He does not smoke. He does not have diabetes.  He's had some slight dry skin.  He's had no mouth sores. He's had no visual changes.  Overall, his performance status is ECOG 0.    Past Medical History  Diagnosis Date  .  Hypertension   . Back pain, chronic   . Neck pain, chronic   . GERD (gastroesophageal reflux disease)   . Arthritis     neck, knees  . DDD (degenerative disc disease)   . Dysphagia   . Asthma   . Anxiety     mainly r/t pain  :  Past Surgical History  Procedure Laterality Date  . Ankle surgery    . Shoulder surgery  1999    rt and lt  . Wrist surgery      lt and rt  . Knee surgery      rt and left x2 each=4  . Esophagogastroduodenoscopy N/A 08/03/2012    Procedure: ESOPHAGOGASTRODUODENOSCOPY (EGD);  Surgeon: Arta Silence, MD;  Location: [facility] ENDOSCOPY;  Service: Endoscopy;  Laterality: N/A;  . Back surgery  [id number]    lumb fusionx2  . Cervical fusion  [id number]    3 surgeries  . Hernia repair      ing-rt  . Tonsillectomy    . Direct laryngoscopy N/A 11/09/2012    Procedure: DIRECT LARYNGOSCOPY;  Surgeon: Jodi Marble, MD;  Location: [place];  Service: ENT;  Laterality: N/A;  . Esophagoscopy w/ botox injection N/A 11/09/2012    Procedure: ESOPHAGOSCOPY POSSIBLE BIOPSY AND POSSIBLE BOTOX INJECTION CRICOPHARYNGEUS;  Surgeon: Jodi Marble, MD;  Location: [place];  Service: ENT;  Laterality: N/A;  Esophagoscopy with botox injection  . Posterior cervical fusion/foraminotomy N/A 03/25/2014    Procedure:  LEFT C7-T1 FORAMINOTOMY;  Surgeon: Jessy Oto, MD;  Location: [place];  Service: Orthopedics;  Laterality: N/A;  :   Current outpatient prescriptions:  .  Ascorbic Acid (VITAMIN C PO), Take 1 tablet by mouth daily., Disp: , Rfl:  .  hydrochlorothiazide (HYDRODIURIL) 25 MG tablet, , Disp: , Rfl: 0 .  loratadine (CLARITIN) 10 MG tablet, Take 10 mg by mouth daily., Disp: , Rfl:  .  LORazepam (ATIVAN) 0.5 MG tablet, Take 0.5 mg by mouth every 8 (eight) hours as needed for anxiety. , Disp: , Rfl:  .  morphine (MS CONTIN) 30 MG 12 hr tablet, Take 30 mg by mouth every 8 (eight) hours., Disp: , Rfl:  .  morphine (MSIR) 15 MG tablet, Take 1 tablet (15  mg total) by mouth every 4 (four) hours as needed (pai)., Disp: 60 tablet, Rfl: 0 .  omeprazole (PRILOSEC) 20 MG capsule, Take 20 mg by mouth 2 (two) times daily. , Disp: , Rfl:  .  Testosterone (ANDROGEL) 20.25 MG/1.25GM (1.62%) GEL, Place 2 application onto the skin daily. , Disp: , Rfl:  .  Triamcinolone Acetonide (TRIAMCINOLONE 0.1 % CREAM : EUCERIN) CREA, APPLY TO AFFECTED AREA TWICE A DAY, Disp: , Rfl: 2 .  valsartan (DIOVAN) 160 MG tablet, , Disp: , Rfl: 0 .  valsartan-hydrochlorothiazide (DIOVAN-HCT) 160-25 MG per tablet, Take 1 tablet by mouth daily., Disp: , Rfl: :  :  Allergies  Allergen Reactions  . Adhesive [Tape] Other (See Comments)    blisters  . Indocin [Indomethacin] Other (See Comments)    Burns stomach  :  No family history on file.:  Social History   Social History  . Marital Status: Married    Spouse Name: N/A  . Number of Children: N/A  . Years of Education: N/A   Occupational History  . Not on file.   Social History Main Topics  . Smoking status: Never Smoker   . Smokeless tobacco: Never Used  . Alcohol Use: No  . Drug Use: No  . Sexual Activity: Not on file   Other Topics Concern  . Not on file   Social History Narrative  :  Pertinent items are noted in HPI.  Exam: '@IPVITALS' @ Well-developed and well-nourished white male in no obvious distress. Vital signs are temperature of 97.9. Pulse 91. Blood pressure 121/70. Weight is 234 pounds. Head neck exam shows no ocular or oral lesions. He has no palpable cervical or supraclavicular lymph nodes. There is no facial plethora. He has no conjunctival inflammation. There is no palpable thyroid. Lungs are clear. Cardiac exam regular rate and rhythm with no murmurs, rubs or bruits. Abdomen is soft. Has good bowel sounds. There is no fluid wave. There is no palpable liver or spleen tip. Back exam shows no tenderness over the spine, ribs or hips. He has laminectomy scars in the cervical spine and lumbar spine.  Extrema shows multiple surgical scars on his joints. He has good range of motion of his joints. Skin exam shows no Ruddy complexion. He has no ecchymoses or petechia. Neurological exam shows no focal neurological deficits.    Recent Labs  09/08/15 1501  WBC 9.3  HGB 17.6*  HCT 49.2  PLT 223   No results for input(s): NA, K, CL, CO2, GLUCOSE, BUN, CREATININE, CALCIUM in the last 72 hours.  Blood smear review:  Normochromic and normocytic papillation of red blood cells. He has no nucleated red blood cells. Has no teardrop cells. There is no schistocytes  or spherocytes. I see no target cells. White cells been normal in morphology maturation. He has no immature myeloid or lymphoid forms. Platelets are adequate in number and size.  Pathology: None     Assessment and Plan:  Jesus Brown is a very nice 50 year old white male. Again he served our country. He is in the [employer] for 12 years. I thanked him for his service to our country area and  I think this erythrocytosis is clearly from testosterone administration. I do not see anything that looks like an underlying myeloproliferative disorder. I do not see anything that looks like polycythemia.  I told him that the best way to try to help him and also to help the community is to donate blood. His blood type is O- so I think that his blood would be very useful for the [employer]. I don't see any reason why could not donate blood.  I don't see that we had to do a bone marrow biopsy on him. I am sending off routine JAK2 analysis. I'm sending off iron studies.  I think the baby aspirin would be helpful for him. I told him to take this every day.  I will get him back in about 2 months. I did this would be a reasonable time frame for follow-up.  I spent about 45 minutes with him. 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

## 2015-09-09 LAB — TESTOSTERONE: Testosterone, Serum: 436 ng/dL (ref 348–1197)

## 2015-09-09 LAB — RETICULOCYTES: RETICULOCYTE COUNT: 2.2 % (ref 0.6–2.6)

## 2015-09-09 LAB — ERYTHROPOIETIN: ERYTHROPOIETIN: 11.4 m[IU]/mL (ref 2.6–18.5)

## 2015-09-11 LAB — IRON AND TIBC
%SAT: 25 % (ref 20–55)
Iron: 72 ug/dL (ref 42–163)
TIBC: 290 ug/dL (ref 202–409)
UIBC: 218 ug/dL (ref 117–376)

## 2015-09-11 LAB — FERRITIN: Ferritin: 125 ng/ml (ref 22–316)

## 2015-09-11 LAB — LACTATE DEHYDROGENASE: LDH: 204 U/L (ref 125–245)

## 2015-09-22 ENCOUNTER — Encounter: Payer: Self-pay | Admitting: Hematology & Oncology

## 2015-11-14 ENCOUNTER — Ambulatory Visit: Payer: BLUE CROSS/BLUE SHIELD | Admitting: Hematology & Oncology

## 2015-11-14 ENCOUNTER — Other Ambulatory Visit: Payer: BLUE CROSS/BLUE SHIELD

## 2016-03-21 ENCOUNTER — Encounter (INDEPENDENT_AMBULATORY_CARE_PROVIDER_SITE_OTHER): Payer: Self-pay

## 2016-03-21 ENCOUNTER — Ambulatory Visit (INDEPENDENT_AMBULATORY_CARE_PROVIDER_SITE_OTHER): Payer: Medicare Other

## 2016-03-21 ENCOUNTER — Encounter (INDEPENDENT_AMBULATORY_CARE_PROVIDER_SITE_OTHER): Payer: Self-pay | Admitting: Specialist

## 2016-03-21 ENCOUNTER — Ambulatory Visit (INDEPENDENT_AMBULATORY_CARE_PROVIDER_SITE_OTHER): Payer: Medicare Other | Admitting: Specialist

## 2016-03-21 VITALS — BP 131/76 | HR 81 | Ht 68.0 in | Wt 226.0 lb

## 2016-03-21 DIAGNOSIS — M25562 Pain in left knee: Secondary | ICD-10-CM

## 2016-03-21 DIAGNOSIS — G8929 Other chronic pain: Secondary | ICD-10-CM | POA: Diagnosis not present

## 2016-03-21 DIAGNOSIS — M1712 Unilateral primary osteoarthritis, left knee: Secondary | ICD-10-CM

## 2016-03-21 NOTE — Patient Instructions (Signed)
If you do not hear back from our surgery scheduler within a week please call our office and notify Dr. Louanne Skye or myself.

## 2016-03-21 NOTE — Progress Notes (Signed)
Office Visit Note   Patient: Jesus Brown           Date of Birth: 1965-06-17           MRN: [id number] Visit Date: 03/21/2016              Requested by: Merrilee Seashore, MD [address] [place], [place] [address] PCP: Merrilee Seashore, MD   Assessment & Plan: Visit Diagnoses:  1. Chronic pain of left knee   2. Unilateral primary osteoarthritis, left knee     Plan: Since patient has failed conservative treatment over the years with Depo-Medrol and Visco subluxation injections along with rehabilitation, activity modification I told him that best treatment option at this point would be total knee replacement. Surgical procedure along with potential rehabilitation/recovery time discussed with him in great detail. I did review the knee model with him along with his x-rays. Preoperative paperwork filled out. We'll get preoperative medical clearance. Patient will like to have surgery done sometime next month if possible since his wife is scheduled to have surgery in March 2018. All questions answered.  Follow-Up Instructions: Return if symptoms worsen or fail to improve.   Orders:  Orders Placed This Encounter  Procedures  . XR Knee 1-2 Views Left   No orders of the defined types were placed in this encounter.     Procedures: No procedures performed   Clinical Data: No additional findings.   Subjective: Chief Complaint  Patient presents with  . Left Knee - Pain    Bilateral knee pain. Left greater than right. States has piece of something rolling underneath knee cap making him be in severe pain. States last week he couldn't put any pressure on left knee. Swelling, Giving out. Wearing brace. Doing Synvisc injections every 6 months with Dr. Greta Doom. Last injection did not help this.   Over the last several years patient's conservative treatment with knee bracing, intra-articular Depo-Medrol and Visco supplementation injections, activity modification.  Significant decrease in his quality of life due to ongoing pain in the left knee. Marked discomfort with ambulation, going up and down stairs and hills. Review of Systems  Constitutional: Negative.   HENT: Negative.   Respiratory: Negative.   Cardiovascular: Negative.   Gastrointestinal: Negative.   Musculoskeletal: Positive for joint swelling.  Neurological: Negative.   Psychiatric/Behavioral: Negative.      Objective: Vital Signs: BP 131/76   Pulse 81   Ht 5\' 8"  (1.727 m)   Wt 226 lb (102.5 kg)   BMI 34.36 kg/m   Physical Exam  Constitutional: No distress.  HENT:  Head: Normocephalic and atraumatic.  Eyes: EOM are normal. Pupils are equal, round, and reactive to light.  Abdominal: He exhibits no distension.  Musculoskeletal:  Gait is antalgic. Left knee range of motion about 5-90 with pain. Some swelling without large effusion. Medial greater than lateral joint line tenderness. Positive patellofemoral crepitus. Bilateral calves are nontender.  Skin: Skin is warm and dry.  Psychiatric: He has a normal mood and affect.    Ortho Exam  Specialty Comments:  No specialty comments available.  Imaging: Xr Knee 1-2 Views Left  Result Date: 03/21/2016 X-rays left knee show tricompartmental degenerative changes most noted in the medial compartment and patellofemoral compartment where it is moderate to severe. Medial compartment subchondral cystic changes. Periarticular spurs. Impression end-stage DJD left knee     PMFS History: Patient Active Problem List   Diagnosis Date Noted  . 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  Hypotestosteronemia 09/08/2015  . Cervical spondylosis without myelopathy 03/25/2014    Class: Chronic  . Left cervical radiculopathy 03/25/2014    Class: Chronic  . Cervical spondylosis with radiculopathy 03/25/2014   Past Medical History:  Diagnosis Date  . Anxiety    mainly r/t pain  . Arthritis    neck, knees  . Asthma   .  Back pain, chronic   . DDD (degenerative disc disease)   . Dysphagia   . 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 GERD (gastroesophageal reflux disease)   . Hypertension   . Hypotestosteronemia 09/08/2015  . Neck pain, chronic     No family history on file.  Past Surgical History:  Procedure Laterality Date  . ANKLE SURGERY    . BACK SURGERY  2010,2011   lumb fusionx2  . CERVICAL FUSION  [id number]   3 surgeries  . DIRECT LARYNGOSCOPY N/A 11/09/2012   Procedure: DIRECT LARYNGOSCOPY;  Surgeon: Jodi Marble, MD;  Location: [place];  Service: ENT;  Laterality: N/A;  . ESOPHAGOGASTRODUODENOSCOPY N/A 08/03/2012   Procedure: ESOPHAGOGASTRODUODENOSCOPY (EGD);  Surgeon: Arta Silence, MD;  Location[facility] END[facility]COPY;  Service: Endoscopy;  Laterality: N/A;  . ESOPHAGOSCOPY W/ BOTOX INJECTION N/A 11/09/2012   Procedure: ESOPHAGOSCOPY POSSIBLE BIOPSY AND POSSIBLE BOTOX INJECTION CRICOPHARYNGEUS;  Surgeon: Jodi Marble, MD;  Location: [place];  Service: ENT;  Laterality: N/A;  Esophagoscopy with botox injection  . HERNIA REPAIR     ing-rt  . KNEE SURGERY     rt and left x2 each=4  . POSTERIOR CERVICAL FUSION/FORAMINOTOMY N/A 03/25/2014   Procedure: LEFT C7-T1 FORAMINOTOMY;  Surgeon: Jessy Oto, MD;  Location: [place];  Service: Orthopedics;  Laterality: N/A;  . [place]   rt and lt  . TONSILLECTOMY    . WRIST SURGERY     lt and rt   Social History   Occupational History  . Not on file.   Social History Main Topics  . Smoking status: Never Smoker  . Smokeless tobacco: Never Used  . Alcohol use No  . Drug use: No  . Sexual activity: Not on file

## 2016-04-30 ENCOUNTER — Telehealth (INDEPENDENT_AMBULATORY_CARE_PROVIDER_SITE_OTHER): Payer: Self-pay | Admitting: *Deleted

## 2016-04-30 NOTE — Telephone Encounter (Signed)
Patient called in this morning in regards to wanting to find out about his possible surgery for a knee replacement? He hasn't heard anything from anyone and was just calling to get an update. His CB # [phone].

## 2016-04-30 NOTE — Telephone Encounter (Signed)
Do you know anything about this? Looks like at last OV they were going to do it March 2018.

## 2016-05-01 NOTE — Telephone Encounter (Signed)
Spoke with patient and advised working on getting clearance from Dr. Ashby Dawes.  Per call back from patient, he has an appointment with Dr. Ashby Dawes on 05/08/16 for clearance.

## 2016-05-10 ENCOUNTER — Other Ambulatory Visit: Payer: Self-pay | Admitting: Physical Medicine and Rehabilitation

## 2016-05-10 ENCOUNTER — Ambulatory Visit
Admission: RE | Admit: 2016-05-10 | Discharge: 2016-05-10 | Disposition: A | Discharge status: Home / Self Care | Payer: BLUE CROSS/BLUE SHIELD | Source: Ambulatory Visit | Attending: Physical Medicine and Rehabilitation | Admitting: Physical Medicine and Rehabilitation

## 2016-05-10 DIAGNOSIS — M160 Bilateral primary osteoarthritis of hip: Secondary | ICD-10-CM

## 2016-07-03 ENCOUNTER — Encounter (HOSPITAL_COMMUNITY)
Admission: RE | Admit: 2016-07-03 | Discharge: 2016-07-03 | Disposition: A | Discharge status: Home / Self Care | Payer: BLUE CROSS/BLUE SHIELD | Source: Ambulatory Visit | Attending: Specialist | Admitting: Specialist

## 2016-07-03 ENCOUNTER — Encounter (HOSPITAL_COMMUNITY): Payer: Self-pay

## 2016-07-03 ENCOUNTER — Other Ambulatory Visit (INDEPENDENT_AMBULATORY_CARE_PROVIDER_SITE_OTHER): Payer: Self-pay | Admitting: Specialist

## 2016-07-03 DIAGNOSIS — Z01818 Encounter for other preprocedural examination: Secondary | ICD-10-CM | POA: Insufficient documentation

## 2016-07-03 DIAGNOSIS — I10 Essential (primary) hypertension: Secondary | ICD-10-CM | POA: Diagnosis not present

## 2016-07-03 DIAGNOSIS — M1712 Unilateral primary osteoarthritis, left knee: Secondary | ICD-10-CM | POA: Insufficient documentation

## 2016-07-03 LAB — BASIC METABOLIC PANEL
Anion gap: 11 (ref 5–15)
BUN: 22 mg/dL — ABNORMAL HIGH (ref 6–20)
CHLORIDE: 104 mmol/L (ref 101–111)
CO2: 25 mmol/L (ref 22–32)
Calcium: 9.3 mg/dL (ref 8.9–10.3)
Creatinine, Ser: 1.11 mg/dL (ref 0.61–1.24)
GFR calc Af Amer: >60 mL/min (ref >60)
GLUCOSE: 103 mg/dL — AB (ref 65–99)
POTASSIUM: 3.6 mmol/L (ref 3.5–5.1)
Sodium: 140 mmol/L (ref 135–145)

## 2016-07-03 LAB — SURGICAL PCR SCREEN
MRSA, PCR: POSITIVE — AB
STAPHYLOCOCCUS AUREUS: POSITIVE — AB

## 2016-07-03 MED ORDER — MUPIROCIN CALCIUM 2 % EX CREA
1.0000 | TOPICAL_CREAM | Freq: Two times a day (BID) | CUTANEOUS | 0 refills | Status: DC
Start: 2016-07-03 — End: 2017-03-04

## 2016-07-03 NOTE — Pre-Procedure Instructions (Signed)
Jesus Brown  07/03/2016      [id number] - [place], [place] [address] [place] [address] Phone: [phone] Fax: [phone]    Your procedure is scheduled on April 6.  Report to [facility] Admitting at 1030 A.M.  Call this number if you have problems the morning of surgery:  [phone]   Remember:  Do not eat food or drink liquids after midnight.  Take these medicines the morning of surgery with A SIP OF WATER Cyclobenzaprine Flexeril if needed, Loratadine (Claritin), Lorazepam (Ativan) if needed, Morphine (Kadian), Morphine (MSIR), Omeprazole (Prilosec)  7 days prior to surgery-Stop taking aspirin, BC's, Goody's, Herbal medications, Fish Oil, Ibuprofen, Advil, Motrin, Aleve, Viatmins    Do not wear jewelry, make-up or nail polish.  Do not wear lotions, powders, or perfumes, or deoderant.  Do not shave 48 hours prior to surgery.  Men may shave face and neck.  Do not bring valuables to the hospital.  [facility] is not responsible for any belongings or valuables.  Contacts, dentures or bridgework may not be worn into surgery.  Leave your suitcase in the car.  After surgery it may be brought to your room.  For patients admitted to the hospital, discharge time will be determined by your treatment team.  Patients discharged the day of surgery will not be allowed to drive home.    Special instructions:  [place] - Preparing for Surgery  Before surgery, you can play an important role.  Because skin is not sterile, your skin needs to be as free of germs as possible.  You can reduce the number of germs on you skin by washing with CHG (chlorahexidine gluconate) soap before surgery.  CHG is an antiseptic cleaner which kills germs and bonds with the skin to continue killing germs even after washing.  Please DO NOT use if you have an allergy to CHG or antibacterial soaps.  If your skin  becomes reddened/irritated stop using the CHG and inform your nurse when you arrive at Short Stay.  Do not shave (including legs and underarms) for at least 48 hours prior to the first CHG shower.  You may shave your face.  Please follow these instructions carefully:   1.  Shower with CHG Soap the night before surgery and the   morning of Surgery.  2.  If you choose to wash your hair, wash your hair first as usual with your  normal shampoo.  3.  After you shampoo, rinse your hair and body thoroughly to remove the  Shampoo.  4.  Use CHG as you would any other liquid soap.  You can apply chg directly to the skin and wash gently with scrungie or a clean washcloth.  5.  Apply the CHG Soap to your body ONLY FROM THE NECK DOWN.   Do not use on open wounds or open sores.  Avoid contact with your eyes,  ears, mouth and genitals (private parts).  Wash genitals (private parts)  with your normal soap.  6.  Wash thoroughly, paying special attention to the area where your surgery will be performed.  7.  Thoroughly rinse your body with warm water from the neck down.  8.  DO NOT shower/wash with your normal soap after using and rinsing off  the CHG Soap.  9.  Pat yourself dry with a clean towel.  10.  Wear clean pajamas.            11.  Place clean sheets on your bed the night of your first shower and do not  sleep with pets.  Day of Surgery  Do not apply any lotions/deoderants the morning of surgery.  Please wear clean clothes to the hospital/surgery center.     Please read over the following fact sheets that you were given. Pain Booklet, Coughing and Deep Breathing, MRSA Information and Surgical Site Infection Prevention

## 2016-07-03 NOTE — Progress Notes (Signed)
LEFT VM WITH SHERRI BILLINGS FOR PREOP ORDERS.

## 2016-07-03 NOTE — Progress Notes (Signed)
   07/03/16 1216  OBSTRUCTIVE SLEEP APNEA  Have you ever been diagnosed with sleep apnea through a sleep study? No  Do you snore loudly (loud enough to be heard through closed doors)?  0  Do you often feel tired, fatigued, or sleepy during the daytime (such as falling asleep during driving or talking to someone)? 0  Has anyone observed you stop breathing during your sleep? 0  Do you have, or are you being treated for high blood pressure? 1  BMI more than 35 kg/m2? 1  Age > 50 (1-yes) 1  Neck circumference greater than:Male 16 inches or larger, Male 17inches or larger? 1  Male Gender (Yes=1) 1  Obstructive Sleep Apnea Score 5  Score 5 or greater  Results sent to PCP

## 2016-07-11 MED ORDER — VANCOMYCIN HCL 10 G IV SOLR
1500.0000 mg | INTRAVENOUS | Status: AC
  Filled 2016-07-11: qty 1500

## 2016-07-11 MED ORDER — CEFAZOLIN SODIUM-DEXTROSE 2-4 GM/100ML-% IV SOLN: 2.0000 g | INTRAVENOUS | Status: DC

## 2016-07-12 NOTE — Progress Notes (Signed)
I called to confirm that pt was aware of this and he states that he has been doing this for the last 5 days, he asked since his surg was r/s if he needed to continue this or not. I advised that I would ask and call him right back----I asked Abigail Butts if he needs to continue or stop this, she said he can continue until his surgery----I called patient back and lmom advising him to continue this until hi surgery.

## 2016-07-17 ENCOUNTER — Encounter (HOSPITAL_COMMUNITY): Payer: Self-pay | Admitting: *Deleted

## 2016-07-17 NOTE — Progress Notes (Signed)
Pt denies SOB and chest pain. Pt denies having a cardiac cath and echo but stated that a stress test was performed in the 1990's. Pt made aware to stop taking Aspirin, vitamins, fish oil and herbal medications. Do not take any NSAIDs ie: Ibuprofen, Advil, Naproxen, BC and Goody Powder or any medication containing Aspirin. Pt verbalized understanding of all pre-op instructions.

## 2016-07-17 NOTE — Progress Notes (Signed)
   07/17/16 1105  OBSTRUCTIVE SLEEP APNEA  Have you ever been diagnosed with sleep apnea through a sleep study? No  Do you snore loudly (loud enough to be heard through closed doors)?  0  Do you often feel tired, fatigued, or sleepy during the daytime (such as falling asleep during driving or talking to someone)? 0  Has anyone observed you stop breathing during your sleep? 0  Do you have, or are you being treated for high blood pressure? 1  BMI more than 35 kg/m2? 1  Age > 50 (1-yes) 1  Neck circumference greater than:Male 16 inches or larger, Male 17inches or larger? 1  Male Gender (Yes=1) 1  Obstructive Sleep Apnea Score 5

## 2016-07-19 ENCOUNTER — Encounter (HOSPITAL_COMMUNITY)
Admission: RE | Disposition: A | Discharge status: Home / Self Care | Payer: Self-pay | Source: Ambulatory Visit | Attending: Specialist

## 2016-07-19 ENCOUNTER — Ambulatory Visit (HOSPITAL_COMMUNITY): Payer: BLUE CROSS/BLUE SHIELD | Admitting: Certified Registered Nurse Anesthetist

## 2016-07-19 ENCOUNTER — Encounter (HOSPITAL_COMMUNITY): Payer: Self-pay | Admitting: Certified Registered Nurse Anesthetist

## 2016-07-19 ENCOUNTER — Ambulatory Visit (HOSPITAL_COMMUNITY): Payer: BLUE CROSS/BLUE SHIELD

## 2016-07-19 ENCOUNTER — Other Ambulatory Visit (INDEPENDENT_AMBULATORY_CARE_PROVIDER_SITE_OTHER): Payer: Self-pay | Admitting: Specialist

## 2016-07-19 ENCOUNTER — Observation Stay (HOSPITAL_COMMUNITY)
Admission: RE | Admit: 2016-07-19 | Discharge: 2016-07-20 | Disposition: A | Discharge status: Home / Self Care | Payer: BLUE CROSS/BLUE SHIELD | Source: Ambulatory Visit | Attending: Specialist | Admitting: Specialist

## 2016-07-19 DIAGNOSIS — M542 Cervicalgia: Secondary | ICD-10-CM | POA: Insufficient documentation

## 2016-07-19 DIAGNOSIS — Z7982 Long term (current) use of aspirin: Secondary | ICD-10-CM | POA: Diagnosis not present

## 2016-07-19 DIAGNOSIS — Z79891 Long term (current) use of opiate analgesic: Secondary | ICD-10-CM | POA: Insufficient documentation

## 2016-07-19 DIAGNOSIS — Z79899 Other long term (current) drug therapy: Secondary | ICD-10-CM | POA: Insufficient documentation

## 2016-07-19 DIAGNOSIS — M1711 Unilateral primary osteoarthritis, right knee: Secondary | ICD-10-CM | POA: Diagnosis present

## 2016-07-19 DIAGNOSIS — E669 Obesity, unspecified: Secondary | ICD-10-CM | POA: Insufficient documentation

## 2016-07-19 DIAGNOSIS — K219 Gastro-esophageal reflux disease without esophagitis: Secondary | ICD-10-CM | POA: Diagnosis not present

## 2016-07-19 DIAGNOSIS — M170 Bilateral primary osteoarthritis of knee: Secondary | ICD-10-CM | POA: Diagnosis not present

## 2016-07-19 DIAGNOSIS — F419 Anxiety disorder, unspecified: Secondary | ICD-10-CM | POA: Diagnosis not present

## 2016-07-19 DIAGNOSIS — G8929 Other chronic pain: Secondary | ICD-10-CM | POA: Diagnosis not present

## 2016-07-19 DIAGNOSIS — I10 Essential (primary) hypertension: Secondary | ICD-10-CM | POA: Diagnosis not present

## 2016-07-19 DIAGNOSIS — M1990 Unspecified osteoarthritis, unspecified site: Secondary | ICD-10-CM | POA: Insufficient documentation

## 2016-07-19 DIAGNOSIS — M1712 Unilateral primary osteoarthritis, left knee: Secondary | ICD-10-CM | POA: Diagnosis present

## 2016-07-19 DIAGNOSIS — Z01818 Encounter for other preprocedural examination: Secondary | ICD-10-CM

## 2016-07-19 DIAGNOSIS — M5030 Other cervical disc degeneration, unspecified cervical region: Secondary | ICD-10-CM | POA: Diagnosis not present

## 2016-07-19 HISTORY — PX: TOTAL KNEE ARTHROPLASTY: SHX125

## 2016-07-19 LAB — COMPREHENSIVE METABOLIC PANEL
ALBUMIN: 4.1 g/dL (ref 3.5–5.0)
ALT: 48 U/L (ref 17–63)
ANION GAP: 8 (ref 5–15)
AST: 39 U/L (ref 15–41)
Alkaline Phosphatase: 72 U/L (ref 38–126)
BILIRUBIN TOTAL: 1 mg/dL (ref 0.3–1.2)
BUN: 20 mg/dL (ref 6–20)
CO2: 28 mmol/L (ref 22–32)
Calcium: 9.1 mg/dL (ref 8.9–10.3)
Chloride: 102 mmol/L (ref 101–111)
Creatinine, Ser: 1.1 mg/dL (ref 0.61–1.24)
GFR calc Af Amer: >60 mL/min (ref >60)
GFR calc non Af Amer: >60 mL/min (ref >60)
GLUCOSE: 108 mg/dL — AB (ref 65–99)
Potassium: 3.5 mmol/L (ref 3.5–5.1)
SODIUM: 138 mmol/L (ref 135–145)
TOTAL PROTEIN: 6.8 g/dL (ref 6.5–8.1)

## 2016-07-19 LAB — PROTIME-INR
INR: 1.06
Prothrombin Time: 13.9 seconds (ref 11.4–15.2)

## 2016-07-19 LAB — CBC
HCT: 47.8 % (ref 39.0–52.0)
Hemoglobin: 16.1 g/dL (ref 13.0–17.0)
MCH: 29.2 pg (ref 26.0–34.0)
MCHC: 33.7 g/dL (ref 30.0–36.0)
MCV: 86.8 fL (ref 78.0–100.0)
PLATELETS: 236 10*3/uL (ref 150–400)
RBC: 5.51 MIL/uL (ref 4.22–5.81)
RDW: 13.2 % (ref 11.5–15.5)
WBC: 8 10*3/uL (ref 4.0–10.5)

## 2016-07-19 LAB — APTT: APTT: 29 s (ref 24–36)

## 2016-07-19 LAB — TYPE AND SCREEN
ABO/RH(D): O NEG
Antibody Screen: NEGATIVE

## 2016-07-19 SURGERY — ARTHROPLASTY, KNEE, TOTAL
Anesthesia: General | Laterality: Left

## 2016-07-19 MED ORDER — ONDANSETRON HCL 4 MG PO TABS: 4.0000 mg | ORAL_TABLET | Freq: Four times a day (QID) | ORAL | Status: DC | PRN

## 2016-07-19 MED ORDER — CYCLOBENZAPRINE HCL 5 MG PO TABS
5.0000 mg | ORAL_TABLET | Freq: Every day | ORAL | Status: DC | PRN
  Administered 2016-07-19 – 2016-07-20 (×2): 5 mg via ORAL
  Filled 2016-07-19 (×2): qty 1

## 2016-07-19 MED ORDER — OXYCODONE HCL 5 MG PO TABS
ORAL_TABLET | ORAL | Status: AC
  Administered 2016-07-19: 10 mg via ORAL
  Filled 2016-07-19: qty 2

## 2016-07-19 MED ORDER — ASPIRIN EC 325 MG PO TBEC
325.0000 mg | DELAYED_RELEASE_TABLET | Freq: Every day | ORAL | Status: DC
  Administered 2016-07-20: 325 mg via ORAL
  Filled 2016-07-19: qty 1

## 2016-07-19 MED ORDER — ONDANSETRON HCL 4 MG/2ML IJ SOLN
INTRAMUSCULAR | Status: AC
  Filled 2016-07-19: qty 2

## 2016-07-19 MED ORDER — ALBUTEROL SULFATE HFA 108 (90 BASE) MCG/ACT IN AERS
INHALATION_SPRAY | RESPIRATORY_TRACT | Status: AC
  Filled 2016-07-19: qty 6.7

## 2016-07-19 MED ORDER — FENTANYL CITRATE (PF) 100 MCG/2ML IJ SOLN
100.0000 ug | Freq: Once | INTRAMUSCULAR | Status: AC
  Administered 2016-07-19: 100 ug via INTRAVENOUS

## 2016-07-19 MED ORDER — PHENOL 1.4 % MT LIQD: 1.0000 | OROMUCOSAL | Status: DC | PRN

## 2016-07-19 MED ORDER — CHLORHEXIDINE GLUCONATE 4 % EX LIQD: 60.0000 mL | Freq: Once | CUTANEOUS | Status: DC

## 2016-07-19 MED ORDER — BUPIVACAINE LIPOSOME 1.3 % IJ SUSP
20.0000 mL | Freq: Once | INTRAMUSCULAR | Status: AC
  Administered 2016-07-19: 20 mL
  Filled 2016-07-19: qty 20

## 2016-07-19 MED ORDER — FENTANYL CITRATE (PF) 250 MCG/5ML IJ SOLN
INTRAMUSCULAR | Status: DC | PRN
  Administered 2016-07-19 (×2): 50 ug via INTRAVENOUS
  Administered 2016-07-19: 150 ug via INTRAVENOUS

## 2016-07-19 MED ORDER — LORATADINE 10 MG PO TABS
10.0000 mg | ORAL_TABLET | Freq: Every day | ORAL | Status: DC
  Administered 2016-07-20: 10 mg via ORAL
  Filled 2016-07-19: qty 1

## 2016-07-19 MED ORDER — BUPIVACAINE HCL (PF) 0.5 % IJ SOLN
INTRAMUSCULAR | Status: DC | PRN
  Administered 2016-07-19: 5 mL

## 2016-07-19 MED ORDER — PROPOFOL 1000 MG/100ML IV EMUL
INTRAVENOUS | Status: AC
  Filled 2016-07-19: qty 100

## 2016-07-19 MED ORDER — HYDROMORPHONE HCL 1 MG/ML IJ SOLN
INTRAMUSCULAR | Status: AC
  Administered 2016-07-19: 0.5 mg via INTRAVENOUS
  Filled 2016-07-19: qty 1

## 2016-07-19 MED ORDER — MORPHINE SULFATE ER 15 MG PO TBCR
60.0000 mg | EXTENDED_RELEASE_TABLET | ORAL | Status: DC
  Administered 2016-07-20: 60 mg via ORAL
  Filled 2016-07-19: qty 4

## 2016-07-19 MED ORDER — FENTANYL CITRATE (PF) 250 MCG/5ML IJ SOLN
INTRAMUSCULAR | Status: AC
  Filled 2016-07-19: qty 5

## 2016-07-19 MED ORDER — FERROUS SULFATE 325 (65 FE) MG PO TABS
325.0000 mg | ORAL_TABLET | Freq: Three times a day (TID) | ORAL | Status: DC
  Administered 2016-07-20: 325 mg via ORAL
  Filled 2016-07-19: qty 1

## 2016-07-19 MED ORDER — GLYCOPYRROLATE 0.2 MG/ML IJ SOLN
INTRAMUSCULAR | Status: DC | PRN
  Administered 2016-07-19: 0.2 mg via INTRAVENOUS

## 2016-07-19 MED ORDER — POLYETHYLENE GLYCOL 3350 17 G PO PACK: 17.0000 g | PACK | Freq: Every day | ORAL | Status: DC | PRN

## 2016-07-19 MED ORDER — SUGAMMADEX SODIUM 200 MG/2ML IV SOLN
INTRAVENOUS | Status: DC | PRN
  Administered 2016-07-19: 200 mg via INTRAVENOUS

## 2016-07-19 MED ORDER — METHYLPREDNISOLONE ACETATE 40 MG/ML IJ SUSP
INTRAMUSCULAR | Status: AC
  Filled 2016-07-19: qty 1

## 2016-07-19 MED ORDER — ONDANSETRON HCL 4 MG/2ML IJ SOLN
INTRAMUSCULAR | Status: DC | PRN
  Administered 2016-07-19: 4 mg via INTRAVENOUS

## 2016-07-19 MED ORDER — MORPHINE SULFATE (PF) 2 MG/ML IV SOLN
1.0000 mg | INTRAVENOUS | Status: DC | PRN
  Administered 2016-07-19: 1 mg via INTRAVENOUS
  Filled 2016-07-19: qty 1

## 2016-07-19 MED ORDER — ROCURONIUM BROMIDE 100 MG/10ML IV SOLN
INTRAVENOUS | Status: DC | PRN
  Administered 2016-07-19: 50 mg via INTRAVENOUS

## 2016-07-19 MED ORDER — VANCOMYCIN HCL IN DEXTROSE 1-5 GM/200ML-% IV SOLN
1000.0000 mg | Freq: Once | INTRAVENOUS | Status: AC
  Administered 2016-07-19: 1000 mg via INTRAVENOUS
  Filled 2016-07-19: qty 200

## 2016-07-19 MED ORDER — DOCUSATE SODIUM 100 MG PO CAPS
100.0000 mg | ORAL_CAPSULE | Freq: Two times a day (BID) | ORAL | Status: DC
  Administered 2016-07-19 – 2016-07-20 (×2): 100 mg via ORAL
  Filled 2016-07-19 (×2): qty 1

## 2016-07-19 MED ORDER — MEPERIDINE HCL 25 MG/ML IJ SOLN: 6.2500 mg | INTRAMUSCULAR | Status: DC | PRN

## 2016-07-19 MED ORDER — IRBESARTAN 150 MG PO TABS
150.0000 mg | ORAL_TABLET | Freq: Every day | ORAL | Status: DC
  Administered 2016-07-20: 150 mg via ORAL
  Filled 2016-07-19: qty 1

## 2016-07-19 MED ORDER — BUPIVACAINE-EPINEPHRINE (PF) 0.5% -1:200000 IJ SOLN
INTRAMUSCULAR | Status: DC | PRN
  Administered 2016-07-19: 30 mL via PERINEURAL

## 2016-07-19 MED ORDER — MIDAZOLAM HCL 2 MG/2ML IJ SOLN
INTRAMUSCULAR | Status: AC
  Filled 2016-07-19: qty 2

## 2016-07-19 MED ORDER — 0.9 % SODIUM CHLORIDE (POUR BTL) OPTIME
TOPICAL | Status: DC | PRN
  Administered 2016-07-19: 1000 mL

## 2016-07-19 MED ORDER — SODIUM CHLORIDE 0.9 % IV SOLN
INTRAVENOUS | Status: DC
Start: 2016-07-19 — End: 2016-07-20
  Administered 2016-07-20: 07:00:00 via INTRAVENOUS

## 2016-07-19 MED ORDER — BUPIVACAINE-EPINEPHRINE 0.5% -1:200000 IJ SOLN
INTRAMUSCULAR | Status: DC | PRN
  Administered 2016-07-19: 20 mL

## 2016-07-19 MED ORDER — ONDANSETRON HCL 4 MG/2ML IJ SOLN
4.0000 mg | Freq: Four times a day (QID) | INTRAMUSCULAR | Status: DC | PRN
  Filled 2016-07-19: qty 2

## 2016-07-19 MED ORDER — PROPOFOL 500 MG/50ML IV EMUL
INTRAVENOUS | Status: DC | PRN
  Administered 2016-07-19: 25 ug/kg/min via INTRAVENOUS

## 2016-07-19 MED ORDER — MENTHOL 3 MG MT LOZG: 1.0000 | LOZENGE | OROMUCOSAL | Status: DC | PRN

## 2016-07-19 MED ORDER — LIDOCAINE 2% (20 MG/ML) 5 ML SYRINGE
INTRAMUSCULAR | Status: AC
  Filled 2016-07-19: qty 5

## 2016-07-19 MED ORDER — MIDAZOLAM HCL 2 MG/2ML IJ SOLN
INTRAMUSCULAR | Status: DC | PRN
  Administered 2016-07-19 (×2): 1 mg via INTRAVENOUS

## 2016-07-19 MED ORDER — PANTOPRAZOLE SODIUM 40 MG PO TBEC
40.0000 mg | DELAYED_RELEASE_TABLET | Freq: Every day | ORAL | Status: DC
  Administered 2016-07-20: 40 mg via ORAL
  Filled 2016-07-19: qty 1

## 2016-07-19 MED ORDER — METHYLPREDNISOLONE ACETATE 40 MG/ML IJ SUSP
INTRAMUSCULAR | Status: DC | PRN
  Administered 2016-07-19: 40 mg

## 2016-07-19 MED ORDER — KETOROLAC TROMETHAMINE 15 MG/ML IJ SOLN
15.0000 mg | Freq: Four times a day (QID) | INTRAMUSCULAR | Status: DC
  Administered 2016-07-19 – 2016-07-20 (×3): 15 mg via INTRAVENOUS
  Filled 2016-07-19 (×2): qty 1

## 2016-07-19 MED ORDER — ALUM & MAG HYDROXIDE-SIMETH 200-200-20 MG/5ML PO SUSP: 30.0000 mL | ORAL | Status: DC | PRN

## 2016-07-19 MED ORDER — METOCLOPRAMIDE HCL 5 MG/ML IJ SOLN: 5.0000 mg | Freq: Three times a day (TID) | INTRAMUSCULAR | Status: DC | PRN

## 2016-07-19 MED ORDER — OXYCODONE HCL 5 MG PO TABS
5.0000 mg | ORAL_TABLET | ORAL | Status: DC | PRN
Start: 2016-07-19 — End: 2016-07-20
  Administered 2016-07-19 – 2016-07-20 (×5): 10 mg via ORAL
  Filled 2016-07-19 (×4): qty 2

## 2016-07-19 MED ORDER — TESTOSTERONE 20.25 MG/1.25GM (1.62%) TD GEL: 2.0000 "application " | Freq: Every day | TRANSDERMAL | Status: DC

## 2016-07-19 MED ORDER — METOCLOPRAMIDE HCL 5 MG PO TABS: 5.0000 mg | ORAL_TABLET | Freq: Three times a day (TID) | ORAL | Status: DC | PRN

## 2016-07-19 MED ORDER — EPINEPHRINE PF 1 MG/ML IJ SOLN
INTRAMUSCULAR | Status: AC
  Filled 2016-07-19: qty 1

## 2016-07-19 MED ORDER — MAGNESIUM CITRATE PO SOLN: 1.0000 | Freq: Once | ORAL | Status: DC | PRN

## 2016-07-19 MED ORDER — BISACODYL 5 MG PO TBEC: 5.0000 mg | DELAYED_RELEASE_TABLET | Freq: Every day | ORAL | Status: DC | PRN

## 2016-07-19 MED ORDER — HYDROMORPHONE HCL 1 MG/ML IJ SOLN
0.2500 mg | INTRAMUSCULAR | Status: DC | PRN
  Administered 2016-07-19 (×4): 0.5 mg via INTRAVENOUS

## 2016-07-19 MED ORDER — MIDAZOLAM HCL 2 MG/2ML IJ SOLN
2.0000 mg | Freq: Once | INTRAMUSCULAR | Status: AC
  Administered 2016-07-19: 2 mg via INTRAVENOUS

## 2016-07-19 MED ORDER — HYDROCHLOROTHIAZIDE 25 MG PO TABS
25.0000 mg | ORAL_TABLET | Freq: Every day | ORAL | Status: DC
  Administered 2016-07-20: 25 mg via ORAL
  Filled 2016-07-19: qty 1

## 2016-07-19 MED ORDER — CYCLOBENZAPRINE HCL 10 MG PO TABS
ORAL_TABLET | ORAL | Status: AC
  Administered 2016-07-19: 5 mg via ORAL
  Filled 2016-07-19: qty 1

## 2016-07-19 MED ORDER — PROMETHAZINE HCL 25 MG/ML IJ SOLN: 6.2500 mg | INTRAMUSCULAR | Status: DC | PRN

## 2016-07-19 MED ORDER — MUPIROCIN CALCIUM 2 % EX CREA: 1.0000 "application " | TOPICAL_CREAM | Freq: Two times a day (BID) | CUTANEOUS | Status: DC

## 2016-07-19 MED ORDER — CEFAZOLIN SODIUM 1 G IJ SOLR
INTRAMUSCULAR | Status: AC
  Filled 2016-07-19: qty 30

## 2016-07-19 MED ORDER — FENTANYL CITRATE (PF) 100 MCG/2ML IJ SOLN
INTRAMUSCULAR | Status: AC
  Administered 2016-07-19: 100 ug via INTRAVENOUS
  Filled 2016-07-19: qty 2

## 2016-07-19 MED ORDER — MIDAZOLAM HCL 2 MG/2ML IJ SOLN
INTRAMUSCULAR | Status: AC
  Administered 2016-07-19: 2 mg via INTRAVENOUS
  Filled 2016-07-19: qty 2

## 2016-07-19 MED ORDER — KETOROLAC TROMETHAMINE 15 MG/ML IJ SOLN
INTRAMUSCULAR | Status: AC
  Administered 2016-07-19: 15 mg via INTRAVENOUS
  Filled 2016-07-19: qty 1

## 2016-07-19 MED ORDER — LIDOCAINE HCL (CARDIAC) 20 MG/ML IV SOLN
INTRAVENOUS | Status: DC | PRN
  Administered 2016-07-19: 60 mg via INTRATRACHEAL

## 2016-07-19 MED ORDER — CEFAZOLIN SODIUM 1 G IJ SOLR
INTRAMUSCULAR | Status: DC | PRN
  Administered 2016-07-19: 3 g via INTRAMUSCULAR

## 2016-07-19 MED ORDER — ALBUTEROL SULFATE HFA 108 (90 BASE) MCG/ACT IN AERS
INHALATION_SPRAY | RESPIRATORY_TRACT | Status: DC | PRN
  Administered 2016-07-19: 2 via RESPIRATORY_TRACT

## 2016-07-19 MED ORDER — ACETAMINOPHEN 325 MG PO TABS: 650.0000 mg | ORAL_TABLET | Freq: Four times a day (QID) | ORAL | Status: DC | PRN

## 2016-07-19 MED ORDER — SODIUM CHLORIDE 0.9 % IR SOLN
Status: DC | PRN
  Administered 2016-07-19: 3000 mL

## 2016-07-19 MED ORDER — MORPHINE SULFATE 15 MG PO TABS
15.0000 mg | ORAL_TABLET | ORAL | Status: DC | PRN
Start: 2016-07-19 — End: 2016-07-20

## 2016-07-19 MED ORDER — EPINEPHRINE 0.3 MG/0.3ML IJ SOAJ
0.3000 mg | INTRAMUSCULAR | Status: DC | PRN
  Filled 2016-07-19: qty 0.3

## 2016-07-19 MED ORDER — LACTATED RINGERS IV SOLN
INTRAVENOUS | Status: DC
  Administered 2016-07-19 (×2): via INTRAVENOUS

## 2016-07-19 MED ORDER — LORAZEPAM 0.5 MG PO TABS: 0.5000 mg | ORAL_TABLET | Freq: Three times a day (TID) | ORAL | Status: DC | PRN

## 2016-07-19 MED ORDER — BUPIVACAINE HCL (PF) 0.5 % IJ SOLN
INTRAMUSCULAR | Status: AC
  Filled 2016-07-19: qty 30

## 2016-07-19 MED ORDER — PROPOFOL 10 MG/ML IV BOLUS
INTRAVENOUS | Status: DC | PRN
  Administered 2016-07-19: 200 mg via INTRAVENOUS

## 2016-07-19 MED ORDER — ACETAMINOPHEN 650 MG RE SUPP: 650.0000 mg | Freq: Four times a day (QID) | RECTAL | Status: DC | PRN

## 2016-07-19 MED ORDER — ARTIFICIAL TEARS OP OINT
TOPICAL_OINTMENT | OPHTHALMIC | Status: AC
  Filled 2016-07-19: qty 3.5

## 2016-07-19 MED ORDER — PHENYLEPHRINE HCL 10 MG/ML IJ SOLN
INTRAMUSCULAR | Status: DC | PRN
  Administered 2016-07-19: 25 ug/min via INTRAVENOUS

## 2016-07-19 SURGICAL SUPPLY — 73 items
ADH SKN CLS APL DERMABOND .7 (GAUZE/BANDAGES/DRESSINGS) ×4
APL SKNCLS STERI-STRIP NONHPOA (GAUZE/BANDAGES/DRESSINGS) ×1
BANDAGE ACE 4X5 VEL STRL LF (GAUZE/BANDAGES/DRESSINGS) ×2 IMPLANT
BANDAGE ESMARK 6X9 LF (GAUZE/BANDAGES/DRESSINGS) ×1 IMPLANT
BENZOIN TINCTURE PRP APPL 2/3 (GAUZE/BANDAGES/DRESSINGS) ×2 IMPLANT
BLADE SAG 18X100X1.27 (BLADE) ×2 IMPLANT
BLADE SAW SGTL 13X75X1.27 (BLADE) ×2 IMPLANT
BNDG ADH 5X3 H2O RPLNT NS (GAUZE/BANDAGES/DRESSINGS) ×1
BNDG CMPR 9X6 STRL LF SNTH (GAUZE/BANDAGES/DRESSINGS) ×1
BNDG CMPR MED 10X6 ELC LF (GAUZE/BANDAGES/DRESSINGS) ×1
BNDG COHESIVE 3X5 WHT NS (GAUZE/BANDAGES/DRESSINGS) ×1 IMPLANT
BNDG ELASTIC 6X10 VLCR STRL LF (GAUZE/BANDAGES/DRESSINGS) ×2 IMPLANT
BNDG ESMARK 6X9 LF (GAUZE/BANDAGES/DRESSINGS) ×2
BOWL SMART MIX CTS (DISPOSABLE) ×2 IMPLANT
CAP KNEE TOTAL 3 SIGMA ×1 IMPLANT
CEMENT HV SMART SET (Cement) ×4 IMPLANT
COVER SURGICAL LIGHT HANDLE (MISCELLANEOUS) ×2 IMPLANT
CUFF TOURNIQUET SINGLE 34IN LL (TOURNIQUET CUFF) ×2 IMPLANT
DERMABOND ADVANCED (GAUZE/BANDAGES/DRESSINGS) ×4
DERMABOND ADVANCED .7 DNX12 (GAUZE/BANDAGES/DRESSINGS) ×2 IMPLANT
DRAPE ORTHO SPLIT 77X108 STRL (DRAPES) ×4
DRAPE SURG ORHT 6 SPLT 77X108 (DRAPES) ×2 IMPLANT
DRAPE U-SHAPE 47X51 STRL (DRAPES) ×2 IMPLANT
DRSG ADAPTIC 3X8 NADH LF (GAUZE/BANDAGES/DRESSINGS) ×2 IMPLANT
DRSG PAD ABDOMINAL 8X10 ST (GAUZE/BANDAGES/DRESSINGS) ×2 IMPLANT
DURAPREP 26ML APPLICATOR (WOUND CARE) ×2 IMPLANT
ELECT REM PT RETURN 9FT ADLT (ELECTROSURGICAL) ×2
ELECTRODE REM PT RTRN 9FT ADLT (ELECTROSURGICAL) ×1 IMPLANT
EVACUATOR 1/8 PVC DRAIN (DRAIN) IMPLANT
FACESHIELD WRAPAROUND (MASK) ×4 IMPLANT
FACESHIELD WRAPAROUND OR TEAM (MASK) ×2 IMPLANT
GAUZE SPONGE 4X4 12PLY STRL (GAUZE/BANDAGES/DRESSINGS) ×2 IMPLANT
GLOVE BIOGEL PI IND STRL 8 (GLOVE) ×1 IMPLANT
GLOVE BIOGEL PI INDICATOR 8 (GLOVE) ×1
GLOVE ECLIPSE 9.0 STRL (GLOVE) ×2 IMPLANT
GLOVE ORTHO TXT STRL SZ7.5 (GLOVE) ×2 IMPLANT
GLOVE SURG 8.5 LATEX PF (GLOVE) ×2 IMPLANT
GOWN STRL REUS W/ TWL LRG LVL3 (GOWN DISPOSABLE) ×1 IMPLANT
GOWN STRL REUS W/TWL 2XL LVL3 (GOWN DISPOSABLE) ×4 IMPLANT
GOWN STRL REUS W/TWL LRG LVL3 (GOWN DISPOSABLE) ×2
HANDPIECE INTERPULSE COAX TIP (DISPOSABLE) ×2
IMMOBILIZER KNEE 22 UNIV (SOFTGOODS) ×1 IMPLANT
KIT BASIN OR (CUSTOM PROCEDURE TRAY) ×2 IMPLANT
KIT ROOM TURNOVER OR (KITS) ×2 IMPLANT
MANIFOLD NEPTUNE II (INSTRUMENTS) ×2 IMPLANT
NDL HYPO 25X1 1.5 SAFETY (NEEDLE) IMPLANT
NEEDLE HYPO 25X1 1.5 SAFETY (NEEDLE) IMPLANT
NS IRRIG 1000ML POUR BTL (IV SOLUTION) ×2 IMPLANT
PACK TOTAL JOINT (CUSTOM PROCEDURE TRAY) ×2 IMPLANT
PAD ARMBOARD 7.5X6 YLW CONV (MISCELLANEOUS) ×4 IMPLANT
PAD CAST 4YDX4 CTTN HI CHSV (CAST SUPPLIES) ×1 IMPLANT
PADDING CAST COTTON 4X4 STRL (CAST SUPPLIES) ×2
PADDING CAST COTTON 6X4 STRL (CAST SUPPLIES) ×2 IMPLANT
PIN STEINMAN FIXATION KNEE (PIN) ×1 IMPLANT
SET HNDPC FAN SPRY TIP SCT (DISPOSABLE) ×1 IMPLANT
STAPLER VISISTAT 35W (STAPLE) IMPLANT
STRIP CLOSURE SKIN 1/2X4 (GAUZE/BANDAGES/DRESSINGS) ×4 IMPLANT
SUCTION FRAZIER HANDLE 10FR (MISCELLANEOUS)
SUCTION TUBE FRAZIER 10FR DISP (MISCELLANEOUS) IMPLANT
SUT BONE WAX W31G (SUTURE) IMPLANT
SUT VIC AB 0 CT1 27 (SUTURE) ×4
SUT VIC AB 0 CT1 27XBRD ANBCTR (SUTURE) ×2 IMPLANT
SUT VIC AB 1 CT1 27 (SUTURE) ×4
SUT VIC AB 1 CT1 27XBRD ANBCTR (SUTURE) ×2 IMPLANT
SUT VIC AB 2-0 CT1 27 (SUTURE) ×6
SUT VIC AB 2-0 CT1 TAPERPNT 27 (SUTURE) ×3 IMPLANT
SUT VICRYL 4-0 PS2 18IN ABS (SUTURE) ×2 IMPLANT
SYR CONTROL 10ML LL (SYRINGE) ×1 IMPLANT
TOWEL OR 17X24 6PK STRL BLUE (TOWEL DISPOSABLE) ×2 IMPLANT
TOWEL OR 17X26 10 PK STRL BLUE (TOWEL DISPOSABLE) ×2 IMPLANT
TRAY CATH 16FR W/PLASTIC CATH (SET/KITS/TRAYS/PACK) IMPLANT
TRAY FOLEY CATH SILVER 16FR (SET/KITS/TRAYS/PACK) IMPLANT
WATER STERILE IRR 1000ML POUR (IV SOLUTION) ×1 IMPLANT

## 2016-07-19 NOTE — Interval H&P Note (Signed)
History and Physical Interval Note:  07/19/2016 12:43 PM  Jesus Brown  has presented today for surgery, with the diagnosis of left knee degenerative joint disease  The various methods of treatment have been discussed with the patient and family. After consideration of risks, benefits and other options for treatment, the patient has consented to  Procedure(s) with comments: LEFT TOTAL KNEE ARTHROPLASTY (Left) - MAY NEED RNFA TO [place]  as a surgical intervention .  The patient's history has been reviewed, patient examined, no change in status, stable for surgery.  I have reviewed the patient's chart and labs.  Questions were answered to the patient's satisfaction.     Jessy Oto

## 2016-07-19 NOTE — Anesthesia Preprocedure Evaluation (Addendum)
Anesthesia Evaluation  Patient identified by MRN, date of birth, ID band Patient awake    Reviewed: Allergy & Precautions, NPO status , Patient's Chart, lab work & pertinent test results  Airway Mallampati: II  TM Distance: >3 FB Neck ROM: Limited    Dental no notable dental hx. (+) Teeth Intact   Pulmonary asthma ,    Pulmonary exam normal breath sounds clear to auscultation- rhonchi       Cardiovascular hypertension, Pt. on medications Normal cardiovascular exam Rhythm:Regular Rate:Normal     Neuro/Psych Anxiety Left cervical radiculopathy  Neuromuscular disease    GI/Hepatic Neg liver ROS, GERD  Medicated and Controlled,Dysphagia   Endo/Other  Hypotestosteronemia Obesity  Renal/GU negative Renal ROS  negative genitourinary   Musculoskeletal  (+) Arthritis , Osteoarthritis,  OA left knee DDD cervical spine Chronic back pain   Abdominal (+) + obese,   Peds  Hematology negative hematology ROS (+) Hx/o erythrocytosis   Anesthesia Other Findings   Reproductive/Obstetrics                             Anesthesia Physical Anesthesia Plan  ASA: II  Anesthesia Plan: General and Regional   Post-op Pain Management:  Regional for Post-op pain   Induction: Intravenous  Airway Management Planned: Oral ETT  Additional Equipment:   Intra-op Plan:   Post-operative Plan: Extubation in OR  Informed Consent: I have reviewed the patients History and Physical, chart, labs and discussed the procedure including the risks, benefits and alternatives for the proposed anesthesia with the patient or authorized representative who has indicated his/her understanding and acceptance.   Dental advisory given  Plan Discussed with: Anesthesiologist, CRNA and Surgeon  Anesthesia Plan Comments:        Anesthesia Quick Evaluation

## 2016-07-19 NOTE — Discharge Instructions (Addendum)
° ° °Keep knee incision dry for 5 days post op then may wet while bathing. °Therapy daily and CPM goal full extension and greater than 90 degrees flexion. °Call if fever or chills or increased drainage. °Go to ER if acutely short of breath or call for ambulance. °Return for follow up in 2 weeks. °May full weight bear on the surgical leg unless told otherwise. °Use knee immobilizer until able to straight leg raise off bed with knee stable. °In house walking for first 2 weeks. °INSTRUCTIONS AFTER JOINT REPLACEMENT  ° °o Remove items at home which could result in a fall. This includes throw rugs or furniture in walking pathways °o ICE to the affected joint every three hours while awake for 30 minutes at a time, for at least the first 3-5 days, and then as needed for pain and swelling.  Continue to use ice for pain and swelling. You may notice swelling that will progress down to the foot and ankle.  This is normal after surgery.  Elevate your leg when you are not up walking on it.   °o Continue to use the breathing machine you got in the hospital (incentive spirometer) which will help keep your temperature down.  It is common for your temperature to cycle up and down following surgery, especially at night when you are not up moving around and exerting yourself.  The breathing machine keeps your lungs expanded and your temperature down. ° ° °DIET:  As you were doing prior to hospitalization, we recommend a well-balanced diet. ° °DRESSING / WOUND CARE / SHOWERING ° °You may change your dressing 3-5 days after surgery.  Then change the dressing every day with sterile gauze.  Please use good hand washing techniques before changing the dressing.  Do not use any lotions or creams on the incision until instructed by your surgeon. ° °ACTIVITY ° °o Increase activity slowly as tolerated, but follow the weight bearing instructions below.   °o No driving for 6 weeks or until further direction given by your physician.  You cannot  drive while taking narcotics.  °o No lifting or carrying greater than 10 lbs. until further directed by your surgeon. °o Avoid periods of inactivity such as sitting longer than an hour when not asleep. This helps prevent blood clots.  °o You may return to work once you are authorized by your doctor.  ° ° ° °WEIGHT BEARING  ° °Weight bearing as tolerated with assist device (walker, cane, etc) as directed, use it as long as suggested by your surgeon or therapist, typically at least 4-6 weeks. ° ° °EXERCISES ° °Results after joint replacement surgery are often greatly improved when you follow the exercise, range of motion and muscle strengthening exercises prescribed by your doctor. Safety measures are also important to protect the joint from further injury. Any time any of these exercises cause you to have increased pain or swelling, decrease what you are doing until you are comfortable again and then slowly increase them. If you have problems or questions, call your caregiver or physical therapist for advice.  ° °Rehabilitation is important following a joint replacement. After just a few days of immobilization, the muscles of the leg can become weakened and shrink (atrophy).  These exercises are designed to build up the tone and strength of the thigh and leg muscles and to improve motion. Often times heat used for twenty to thirty minutes before working out will loosen up your tissues and help with improving the range   of motion but do not use heat for the first two weeks following surgery (sometimes heat can increase post-operative swelling).  ° °These exercises can be done on a training (exercise) mat, on the floor, on a table or on a bed. Use whatever works the best and is most comfortable for you.    Use music or television while you are exercising so that the exercises are a pleasant break in your day. This will make your life better with the exercises acting as a break in your routine that you can look forward  to.   Perform all exercises about fifteen times, three times per day or as directed.  You should exercise both the operative leg and the other leg as well. ° °Exercises include: °  °• Quad Sets - Tighten up the muscle on the front of the thigh (Quad) and hold for 5-10 seconds.   °• Straight Leg Raises - With your knee straight (if you were given a brace, keep it on), lift the leg to 60 degrees, hold for 3 seconds, and slowly lower the leg.  Perform this exercise against resistance later as your leg gets stronger.  °• Leg Slides: Lying on your back, slowly slide your foot toward your buttocks, bending your knee up off the floor (only go as far as is comfortable). Then slowly slide your foot back down until your leg is flat on the floor again.  °• Angel Wings: Lying on your back spread your legs to the side as far apart as you can without causing discomfort.  °• Hamstring Strength:  Lying on your back, push your heel against the floor with your leg straight by tightening up the muscles of your buttocks.  Repeat, but this time bend your knee to a comfortable angle, and push your heel against the floor.  You may put a pillow under the heel to make it more comfortable if necessary.  ° °A rehabilitation program following joint replacement surgery can speed recovery and prevent re-injury in the future due to weakened muscles. Contact your doctor or a physical therapist for more information on knee rehabilitation.  ° ° °CONSTIPATION ° °Constipation is defined medically as fewer than three stools per week and severe constipation as less than one stool per week.  Even if you have a regular bowel pattern at home, your normal regimen is likely to be disrupted due to multiple reasons following surgery.  Combination of anesthesia, postoperative narcotics, change in appetite and fluid intake all can affect your bowels.  ° °YOU MUST use at least one of the following options; they are listed in order of increasing strength to get  the job done.  They are all available over the counter, and you may need to use some, POSSIBLY even all of these options:   ° °Drink plenty of fluids (prune juice may be helpful) and high fiber foods °Colace 100 mg by mouth twice a day  °Senokot for constipation as directed and as needed Dulcolax (bisacodyl), take with full glass of water  °Miralax (polyethylene glycol) once or twice a day as needed. ° °If you have tried all these things and are unable to have a bowel movement in the first 3-4 days after surgery call either your surgeon or your primary doctor.   ° °If you experience loose stools or diarrhea, hold the medications until you stool forms back up.  If your symptoms do not get better within 1 week or if they get worse, check with your   doctor.  If you experience "the worst abdominal pain ever" or develop nausea or vomiting, please contact the office immediately for further recommendations for treatment. ° ° °ITCHING:  If you experience itching with your medications, try taking only a single pain pill, or even half a pain pill at a time.  You can also use Benadryl over the counter for itching or also to help with sleep.  ° °TED HOSE STOCKINGS:  Use stockings on both legs until for at least 2 weeks or as directed by physician office. They may be removed at night for sleeping. ° °MEDICATIONS:  See your medication summary on the “After Visit Summary” that nursing will review with you.  You may have some home medications which will be placed on hold until you complete the course of blood thinner medication.  It is important for you to complete the blood thinner medication as prescribed. ° °PRECAUTIONS:  If you experience chest pain or shortness of breath - call 911 immediately for transfer to the hospital emergency department.  ° °If you develop a fever greater that 101 F, purulent drainage from wound, increased redness or drainage from wound, foul odor from the wound/dressing, or calf pain - CONTACT YOUR  SURGEON.   °                                                °FOLLOW-UP APPOINTMENTS:  If you do not already have a post-op appointment, please call the office for an appointment to be seen by your surgeon.  Guidelines for how soon to be seen are listed in your “After Visit Summary”, but are typically between 1-4 weeks after surgery. ° °OTHER INSTRUCTIONS:  ° °Knee Replacement:  Do not place pillow under knee, focus on keeping the knee straight while resting. CPM instructions: 0-90 degrees, 2 hours in the morning, 2 hours in the afternoon, and 2 hours in the evening. Place foam block, curve side up under heel at all times except when in CPM or when walking.  DO NOT modify, tear, cut, or change the foam block in any way. ° °MAKE SURE YOU:  °• Understand these instructions.  °• Get help right away if you are not doing well or get worse.  ° ° °Thank you for letting us be a part of your medical care team.  It is a privilege we respect greatly.  We hope these instructions will help you stay on track for a fast and full recovery!  ° °

## 2016-07-19 NOTE — Anesthesia Postprocedure Evaluation (Addendum)
Anesthesia Post Note  Patient: DONTERIUS FILLEY  Procedure(s) Performed: Procedure(s) (LRB): LEFT TOTAL KNEE ARTHROPLASTY CORTISON INJECTION IN RIGHT KNEE (Left)  Patient location during evaluation: PACU Anesthesia Type: General and Regional Level of consciousness: awake and alert, oriented and patient cooperative Pain management: pain level controlled Vital Signs Assessment: post-procedure vital signs reviewed and stable Respiratory status: spontaneous breathing, nonlabored ventilation, respiratory function stable and patient connected to nasal cannula oxygen Cardiovascular status: blood pressure returned to baseline and stable Postop Assessment: no signs of nausea or vomiting Anesthetic complications: no       Last Vitals:  Vitals:   07/19/16 1715 07/19/16 1730  BP: 130/70 126/78  Pulse: 86 98  Resp: 10 20  Temp:      Last Pain:  Vitals:   07/19/16 1157  TempSrc:   PainSc: 0-No pain                 Vernell Townley,E. Shritha Bresee

## 2016-07-19 NOTE — Progress Notes (Signed)
Patient ID: Jesus Brown, male   DOB: 1965/06/24, 51 y.o.   MRN: [id number] Seen in the recovery room, awake, drowsy, oriented x 2. Left leg neurovascular normal. Scheduled as an outpatient, will be working to try to discharge tomorrow.

## 2016-07-19 NOTE — Op Note (Signed)
07/19/2016  4:41 PM  PATIENT:  Jesus Brown  51 y.o. male  MRN: [id number]  OPERATIVE REPORT   PRE-OPERATIVE DIAGNOSIS:  left knee degenerative joint disease  POST-OPERATIVE DIAGNOSIS:  left knee degenerative joint disease  PROCEDURE:  Procedure(s): LEFT TOTAL KNEE ARTHROPLASTY CORTISON INJECTION IN RIGHT KNEE    SURGEON: Jessy Oto, MD     ASSISTANT:  Benjiman Core, PA-C  (Present throughout the entire procedure and necessary for completion of procedure in a timely manner)     ANESTHESIA:  General with supplemental Left obturator block. Local supplemented with marcaine 0.5% 1:1 exparel 1.3% total 30cc. Josephine Igo MD.    COMPLICATIONS:  None.   EBL:150CC  TOTAL TOURNIQUET TIME:         99 MIN @   300 Mm Hg    COMPONENTS:  DePuy Sigma cemented total knee system.  A #3 femoral component.  A #4 tibial tray with a 10 mm polyethylene RP tibial spacer , a 38 mm polyethylene patella.   PROCEDURE:The patient was met in the holding area, and the appropriate left knee identified and marked with "X" and my initials. The patient did not received a preoperative femoral nerve block by anesthesia.  The patient was then transported to OR and was placed on the operative table in a supine position. The patient was then placed under general anesthesia without difficulty. The patient received appropriate preoperative antibiotic prophylaxis vancomycin 1 gm and ancef 3 gm. The patien'ts left knee was examined under anesthesia and shown to have 0-130 range of motion,varus deformity, ligamentously stable, and normal patella tracking.Tourniquet was applied to the operative thigh. Leg was then prepped using sterile conditions and draped using sterile technique. Time-out procedure was called and correct left knee identified.  The leg was elevated and Esmarch exsanguinated with a thigh  tourniquet elevated ot 300 mmHg.  Initially, through a 15-cm longitudinal incision based over the patella initial  exposure was made. The underlying subcutaneous tissues were incised along with the skin incision. A median arthrotomy was performed revealing an excessive amount of normal-appearing joint fluid, The articular surfaces were inspected. The patient had grade 4 changes medially, grade 2 changes laterally, and grade 3 changes in the patellofemoral joint. Osteophytes were removed from the femoral condyles and the tibial plateau. The medial and lateral meniscal remnants were removed as well as the anterior cruciate ligament.   Attention then turned to the femur where the knee was flexed to 90 degrees and an intramedullary guide placed a drill hole placed just anterior 7 mm to the intercondylar notch on the distal femur. The distal cutting jig then attached to the intramedulllay nail 5 degrees of valgus for the left knee. A planned 9 mm to be removed off the most prominent condyle, medial in this case. The jig pinned in place and the distal femoral cut performed.  Anterior bow of the femur measured at 0. Based on this then the amount of proximal tibial cut was determined to be in line with off the depressed medial side of the joint 76m. Using the proximal tibial cutting jig with the leg alignment guide the jig was pinned to the proximal tibia. 3 pins were used 0 degrees of posterior slope.Tibia then subluxed and proximal tibial cut was then performed.Examining the knee in extension with an extension block was found to be extremely tight so that a further 4 mm was resected from the left proximal tibia.  Soft tissue tensioning then examined and found to  be well balanced in extension. #3 femoral component chosen. The trial placed against the end of the femur and felt to be a good fit. Distal femur cutting jig was then carefully positioned on the distal femur using the end guide and placing 2 pins in the anterior end of the cut distal femur.  2 threaded pins used to pin the jig in place   Rotation guide was then corrected the  alignment 2 pins placed over the distal cut surface of the femur for placement of the jig for the chamfer cuts and coronal cuts. This is a carefully placed and held in place with threaded pins. Protecting soft tissues then the anterior cut was made after first verifying with the wing depth of the cut. Then the posterior coronal cuts protect soft tissue structures posteriorly. Posterior chamfer cut and anterior chamfer cut. These were done without difficulty. Cutting guide removed and box cutting jig was then applied to the distal femur carefully aligned and pinned into place. Box cut was then performed from the distal femur intercondylar box. Proximal tibia was then subluxed the posterior cruciate resected. A #4 plate and attached to the transverse cut surface of the proximal tibia using 2 pins. The upright for the reamer was then applied and reaming carried out for the keel for the tibial component. Using the osteotome was then inserted and impacted into place the temporary fixation pins removed from the proximal tibial plate. Femoral component was inserted using a trial component. Then a 10 mm insert placed and the knee brought into full extension full extension to 0 and 1.0 hyperextension was possible full flexion to 135 without difficulty or lift off. The components were accepted and the permanent #3 femoral and #4 tibial components chosen and brought onto the field. Patella was observed to be a width anterior to posterior 24 mm and the residual 15 mm was allowed for the patella component. The cutting jig was then adjusted to allow for 15 mm remaining width. Applied and then the coronal cut the posterior aspect of the patella was performed. Lateral facet of patella showed very little resection. The trial 38 mm patella was chosen based on the size of the cut surface of patella. The 38 mm drill plate applied and drill holes placed through the posterior aspect of the patella. Trial reduction with the 38 mm trial  and the leg placed through range of motion showed excellent motion full extension and flexion 135 patella tendency to elevate medially was clamped in place over the medial retinaculum patellar showed normal appearance with flexion extension. No shuck noted then the knee was stable to varus and valgus at 0 30 and 60. Expose were then removed and the knee was then irrigated with copious amounts of. Solution. A 38 mm permanent patella prosthesis also brought to the field. Cement was mixed the knee was subluxed and carefully soft tissues protected note that the lug holes in the distal femur where drill using the trial prosthesis and with the drill guide provided. With irrigation completed and permanent tibial components were then inserted into place using cement and a semi-putty state over the proximal aspect of the tibia cement was also applied to the deep surface of the tibial component as well as over the posterior runners of the femur and the deep surface of the patella component. These were then carefully place excess cement removed about the circumference of the tibial tray the femur was then placed applying cement to the cut it distal aspect  of the femur and posterior chamfer area anterior chamfer and anterior coronal cut surface small amount within the box. Femoral component was then impacted into place excess cement removed amounts of circumference including the posterior chamfer area and the posterior femoral condyle area. The trial tibial peg then placed in the tibial component and a 10 mm rotating platform trial was inserted knee brought into full extension observed on the appeared to be in 2 of valgus with full extension 1 of hyperextension. Cement was then applied to the posterior cut surface of the patella within each of the patella peg opening was then pin holes were placed for the cementing along the lateral aspect of the patella. Patella component was then carefully aligned and inserted over the  posterior aspect of the patella and clamped in place excess cement was then resected circumferentially using scalpel in order for her to preserve cement the lateral facet area. Cement was allowed to fully harden tourniquet was released while cement was nearly completely hardened. Following this then irrigation was carried down careful inspection demonstrated some small bleeders present over the lateral posterior aspect of the knee joint cauterized using electrocautery off to the medial aspect and the areas of geniculate arteries. There is no active bleeding present then at that time. Trial 10 mm tibial insert demonstrated stability of the anterior drawer and negative and the knee stable to varus valgus stress and 30 and 60 flexion and full extension. A 10 mm rotating platform insert was then chosen this was applied to the tibia using the permanent implant removing the trial examining tibia and determined there was no evident residual cement remaining. Also examined posterior runners I posterior aspect of the femoral condyles for any residual cement none was present. The tibial insert in place the reduced placed through range of motion and full extension flexion and 30 knee stable to varus stress at 0 30 and 60 anterior drawer negative. This component was accepted.Tourniquet released at 76 minutes.  A drain was not necessary the synovium reapproximated over the medial aspect of the knee with 0 Vicryl sutures.  The retinaculum of the knee and the quadriceps tendon were then reapproximated with interrupted #1 Vicryl sutures. Peritenon of the reapproximated with interrupted 0 Vicryl sutures deep subcutaneous layers approximated with interrupted 0 and 2-0 Vicryl sutures and the skin closed with a running subcutaneous stitch of 4-0 Vicryl. Dermabond was then applied. 30 cc of marcaine 1/2%1:1 exparel 1.3%instilled into the left knee via the hemovac drain. Adaptic 4 x 4's ABDs pads fixed to the skin with sterile labrum  an Ace wrap applied from the foot to the right upper thigh and knee immobilizer. All instrument and sponge counts were correct. Then reactivated extubated and returned to recovery room in satisfactory condition.  Benjiman Core, PA-C perform the duties of assistant surgeon she performed careful retraction of soft tissues assisted in addition the patient had removal on the OR table is present beginning the case the case and performed closure of the incision from the peritenon to the skin and application of dressing.  OSHEN WLODARCZYK 07/19/2016, 4:41 PM

## 2016-07-19 NOTE — Anesthesia Procedure Notes (Addendum)
Anesthesia Regional Block: Adductor canal block   Pre-Anesthetic Checklist: ,, timeout performed, Correct Patient, Correct Site, Correct Laterality, Correct Procedure, Correct Position, site marked, Risks and benefits discussed,  Surgical consent,  Pre-op evaluation,  At surgeon's request and post-op pain management  Laterality: Left  Prep: chloraprep       Needles:  Injection technique: Single-shot  Needle Type: Echogenic Stimulator Needle     Needle Length: 9cm  Needle Gauge: 21   Needle insertion depth: 5 cm   Additional Needles:   Narrative:  Start time: 07/19/2016 11:58 AM End time: 07/19/2016 12:03 PM Injection made incrementally with aspirations every 5 mL.  Performed by: Personally  Anesthesiologist: Josephine Igo  Additional Notes: Timeout performed. Patient sedated. Relevant anatomy ID'd using [place]. Incremental 2-69ml injection with frequent aspiration. Patient tolerated procedure well.

## 2016-07-19 NOTE — Transfer of Care (Signed)
Immediate Anesthesia Transfer of Care Note  Patient: Jesus Brown  Procedure(s) Performed: Procedure(s) with comments: LEFT TOTAL KNEE ARTHROPLASTY CORTISON INJECTION IN RIGHT KNEE (Left) - MAY NEED RNFA TO FINISH CASE PER [place]   Patient Location: PACU  Anesthesia Type:General and GA combined with regional for post-op pain  Level of Consciousness: sedated  Airway & Oxygen Therapy: Patient Spontanous Breathing and Patient connected to face mask oxygen  Post-op Assessment: Report given to RN and Post -op Vital signs reviewed and stable  Post vital signs: Reviewed and stable  Last Vitals:  Vitals:   07/19/16 1157 07/19/16 1203  BP: 107/73 98/61  Pulse: 81 90  Resp: 15 14  Temp:      Last Pain:  Vitals:   07/19/16 1157  TempSrc:   PainSc: 0-No pain      Patients Stated Pain Goal: 3 (50/41/36 4383)  Complications: No apparent anesthesia complications

## 2016-07-19 NOTE — H&P (Signed)
TOTAL KNEE ADMISSION H&P  Patient is being admitted for left total knee arthroplasty.  Subjective:  Chief Complaint:left knee pain.  HPI: Jesus Brown, 51 y.o. male, has a history of pain and functional disability in the left knee due to arthritis and has failed non-surgical conservative treatments for greater than 12 weeks to includeNSAID's and/or analgesics, corticosteriod injections, use of assistive devices and activity modification.  Onset of symptoms was gradual, starting 10 years ago with gradually worsening course since that time.   Patient currently rates pain in the left knee(s) at 10 out of 10 with activity. Patient has worsening of pain with activity and weight bearing, pain that interferes with activities of daily living, pain with passive range of motion, crepitus and joint swelling.  Patient has evidence of subchondral cysts, subchondral sclerosis, periarticular osteophytes and joint space narrowing by imaging studies.  There is no active infection.  Patient Active Problem List   Diagnosis Date Noted  . 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 Hypotestosteronemia 09/08/2015  . Cervical spondylosis without myelopathy 03/25/2014    Class: Chronic  . Left cervical radiculopathy 03/25/2014    Class: Chronic  . Cervical spondylosis with radiculopathy 03/25/2014   Past Medical History:  Diagnosis Date  . Anxiety    mainly r/t pain  . Arthritis    neck, knees  . Asthma    AS CHILD   . Back pain, chronic   . DDD (degenerative disc disease)   . Dysphagia   . 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 GERD (gastroesophageal reflux disease)   . Hypertension   . Hypotestosteronemia 09/08/2015  . Neck pain, chronic     Past Surgical History:  Procedure Laterality Date  . ANKLE SURGERY    . BACK SURGERY  2010,2011   lumb fusionx2  . CERVICAL FUSION  3354,5625   3 surgeries  . DIRECT LARYNGOSCOPY N/A 11/09/2012   Procedure: DIRECT LARYNGOSCOPY;   Surgeon: Jodi Marble, MD;  Location: [facility];  Service: ENT;  Laterality: N/A;  . ESOPHAGOGASTRODUODENOSCOPY N/A 08/03/2012   Procedure: ESOPHAGOGASTRODUODENOSCOPY (EGD);  Surgeon: Arta Silence, MD;  [facility] ENDOSCOPY;  Service: Endoscopy;  Laterality: N/A;  . ESOPHAGOSCOPY W/ BOTOX INJECTION N/A 11/09/2012   Procedure: ESOPHAGOSCOPY POSSIBLE BIOPSY AND POSSIBLE BOTOX INJECTION CRICOPHARYNGEUS;  Surgeon: Jodi Marble, MD;  Location: [place];  Service: ENT;  Laterality: N/A;  Esophagoscopy with botox injection  . HERNIA REPAIR     ing-rt  . KNEE SURGERY     rt and left x2 each=4  . POSTERIOR CERVICAL FUSION/FORAMINOTOMY N/A 03/25/2014   Procedure: LEFT C7-T1 FORAMINOTOMY;  Surgeon: Jessy Oto, MD;  Location: [place];  Service: Orthopedics;  Laterality: N/A;  . [place]   rt and lt  . TONSILLECTOMY    . WRIST SURGERY     lt and rt    Prescriptions Prior to Admission  Medication Sig Dispense Refill Last Dose  . Aspirin-Acetaminophen-Caffeine (GOODY HEADACHE PO) Take 1 packet by mouth daily as needed (pain).   Past Week at Unknown time  . cyclobenzaprine (FLEXERIL) 5 MG tablet Take 5 mg by mouth daily as needed for muscle spasms.  2 Past Month at Unknown time  . EPINEPHrine (EPIPEN 2-PAK) 0.3 mg/0.3 mL IJ SOAJ injection Inject 0.3 mg into the muscle as needed (allergic reaction).     . hydrochlorothiazide (HYDRODIURIL) 25 MG tablet Take 25 mg by mouth daily.   0 07/18/2016 at Unknown time  .  loratadine (CLARITIN) 10 MG tablet Take 10 mg by mouth daily.   07/18/2016 at Unknown time  . LORazepam (ATIVAN) 0.5 MG tablet Take 0.5 mg by mouth every 8 (eight) hours as needed for anxiety.    Past Week at Unknown time  . morphine (KADIAN) 60 MG 24 hr capsule Take 60 mg by mouth every morning.  0 07/19/2016 at Unknown time  . morphine (MSIR) 15 MG tablet Take 1 tablet (15 mg total) by mouth every 4 (four) hours as needed (pai). (Patient taking  differently: Take 15 mg by mouth 3 (three) times daily as needed for moderate pain. ) 60 tablet 0 07/18/2016 at Unknown time  . mupirocin cream (BACTROBAN) 2 % Apply 1 application topically 2 (two) times daily. With a Q tip apply to the nares BID up to the day of surgery. 15 g 0 07/18/2016 at Unknown time  . omeprazole (PRILOSEC) 20 MG capsule Take 20 mg by mouth daily.    07/19/2016 at Unknown time  . PRESCRIPTION MEDICATION Inject 2 Doses as directed once a week. Allergy shots   Past Month at Unknown time  . Testosterone (ANDROGEL) 20.25 MG/1.25GM (1.62%) GEL Place 2 application onto the skin daily.    07/18/2016 at Unknown time  . valsartan (DIOVAN) 160 MG tablet Take 160 mg by mouth daily.   0 07/18/2016 at Unknown time   Allergies  Allergen Reactions  . Baclofen Nausea Only and Other (See Comments)    Aches and pains   . Indocin [Indomethacin] Other (See Comments)    Burns stomach  . Adhesive [Tape] Other (See Comments)    Blisters, paper tape is ok    Social History  Substance Use Topics  . Smoking status: Never Smoker  . Smokeless tobacco: Never Used  . Alcohol use No    History reviewed. No pertinent family history.   Review of Systems  Eyes: Negative.   Respiratory: Negative.   Cardiovascular: Negative.   Gastrointestinal: Negative.   Musculoskeletal: Positive for joint pain.  Neurological: Negative.   Psychiatric/Behavioral: Negative.     Objective:  Physical Exam  Constitutional: He is oriented to person, place, and time. No distress.  HENT:  Head: Normocephalic and atraumatic.  Eyes: EOM are normal. Pupils are equal, round, and reactive to light.  Neck: Normal range of motion.  Respiratory: No respiratory distress.  GI: He exhibits no distension.  Musculoskeletal:  Gait antalgic. Positive crevice. Joint line tender. Positive effusion.  Neurological: He is alert and oriented to person, place, and time.  Skin: Skin is warm and dry.  Psychiatric: He has a normal  mood and affect.    Vital signs in last 24 hours: Temp:  [98.4 F (36.9 C)] 98.4 F (36.9 C) (04/13 1025) Pulse Rate:  [81] 81 (04/13 1157) Resp:  [15-18] 15 (04/13 1157) BP: (107-126)/(73-75) 107/73 (04/13 1157) SpO2:  [96 %-98 %] 98 % (04/13 1157) Weight:  [239 lb 3.2 oz (108.5 kg)] 239 lb 3.2 oz (108.5 kg) (04/13 1045)  Labs:   Estimated body mass index is 36.37 kg/m as calculated from the following:   Height as of this encounter: 5\' 8"  (1.727 m).   Weight as of this encounter: 239 lb 3.2 oz (108.5 kg).   Imaging Review Plain radiographs demonstrate moderate degenerative joint disease of the left knee(s). The overall alignment ismild varus. The bone quality appears to be good for age and reported activity level.  Assessment/Plan:  End stage arthritis, left knee   The patient  history, physical examination, clinical judgment of the provider and imaging studies are consistent with end stage degenerative joint disease of the left knee(s) and total knee arthroplasty is deemed medically necessary. The treatment options including medical management, injection therapy arthroscopy and arthroplasty were discussed at length. The risks and benefits of total knee arthroplasty were presented and reviewed. The risks due to aseptic loosening, infection, stiffness, patella tracking problems, thromboembolic complications and other imponderables were discussed. The patient acknowledged the explanation, agreed to proceed with the plan and consent was signed. Patient is being admitted for inpatient treatment for surgery, pain control, PT, OT, prophylactic antibiotics, VTE prophylaxis, progressive ambulation and ADL's and discharge planning. The patient is planning to be discharged home with home health services

## 2016-07-19 NOTE — Anesthesia Procedure Notes (Signed)
Procedure Name: Intubation Date/Time: 07/19/2016 1:10 PM Performed by: Annye Asa Pre-anesthesia Checklist: Patient identified, Emergency Drugs available, Suction available and Patient being monitored Patient Re-evaluated:Patient Re-evaluated prior to inductionOxygen Delivery Method: Circle system utilized Preoxygenation: Pre-oxygenation with 100% oxygen Intubation Type: IV induction Ventilation: Nasal airway inserted- appropriate to patient size and Mask ventilation with difficulty Laryngoscope Size: Glidescope (T3) Grade View: Grade II Tube type: Oral Tube size: 7.5 mm Number of attempts: 1 Airway Equipment and Method: Video-laryngoscopy Placement Confirmation: ETT inserted through vocal cords under direct vision,  positive ETCO2 and breath sounds checked- equal and bilateral Secured at: 22 cm Tube secured with: Tape Dental Injury: Teeth and Oropharynx as per pre-operative assessment  Difficulty Due To: Difficulty was anticipated

## 2016-07-19 NOTE — Progress Notes (Signed)
Orthopedic Tech Progress Note Patient Details:  Jesus Brown 06/10/1965 [id number]  CPM Left Knee CPM Left Knee: On Left Knee Flexion (Degrees): 60 Left Knee Extension (Degrees): 0 Additional Comments: Applied cpm at .0-60 degree to pt left knee/leg. pt tolerated well.  Set bone foam zero degree at bedside.  Left knee/leg.    Kristopher Oppenheim 07/19/2016, 6:01 PM

## 2016-07-19 NOTE — Brief Op Note (Signed)
07/19/2016  3:45 PM  PATIENT:  Bennie Hind  51 y.o. male  PRE-OPERATIVE DIAGNOSIS:  left knee degenerative joint disease  POST-OPERATIVE DIAGNOSIS:  left knee degenerative joint disease  PROCEDURE:  Procedure(s) with comments: LEFT TOTAL KNEE ARTHROPLASTY CORTISON INJECTION IN RIGHT KNEE (Left) - MAY NEED RNFA TO FINISH CASE PER [place]   SURGEON:  Surgeon(s) and Role:    * Jessy Oto, MD - Primary  PHYSICIAN ASSISTANT: Benjiman Core PA-C  ANESTHESIA:   local and general, Josephine Igo MD  EBL:  Total I/O In: 1500 [I.V.:1500] Out: 150 [Blood:150]  BLOOD ADMINISTERED:none  DRAINS: none   LOCAL MEDICATIONS USED:  MARCAINE 0.5% 1:1 EXPAREL 1.3%   and Amount: 30 ml  SPECIMEN:  No Specimen  DISPOSITION OF SPECIMEN:  N/A  COUNTS:  YES  TOURNIQUET:   Total Tourniquet Time Documented: Thigh (Left) - 101 minutes Total: Thigh (Left) - 101 minutes   DICTATION: .Viviann Spare Dictation  PLAN OF CARE: Admit to inpatient   PATIENT DISPOSITION:  PACU - hemodynamically stable.   Delay start of Pharmacological VTE agent (>24hrs) due to surgical blood loss or risk of bleeding: no

## 2016-07-20 DIAGNOSIS — M170 Bilateral primary osteoarthritis of knee: Secondary | ICD-10-CM | POA: Diagnosis not present

## 2016-07-20 LAB — CBC
HCT: 40.7 % (ref 39.0–52.0)
HEMOGLOBIN: 13.5 g/dL (ref 13.0–17.0)
MCH: 28.9 pg (ref 26.0–34.0)
MCHC: 33.2 g/dL (ref 30.0–36.0)
MCV: 87.2 fL (ref 78.0–100.0)
PLATELETS: 201 10*3/uL (ref 150–400)
RBC: 4.67 MIL/uL (ref 4.22–5.81)
RDW: 12.9 % (ref 11.5–15.5)
WBC: 16.3 10*3/uL — AB (ref 4.0–10.5)

## 2016-07-20 LAB — BASIC METABOLIC PANEL
ANION GAP: 10 (ref 5–15)
BUN: 19 mg/dL (ref 6–20)
CHLORIDE: 100 mmol/L — AB (ref 101–111)
CO2: 26 mmol/L (ref 22–32)
Calcium: 8.8 mg/dL — ABNORMAL LOW (ref 8.9–10.3)
Creatinine, Ser: 1.21 mg/dL (ref 0.61–1.24)
GFR calc Af Amer: >60 mL/min (ref >60)
Glucose, Bld: 140 mg/dL — ABNORMAL HIGH (ref 65–99)
POTASSIUM: 3.5 mmol/L (ref 3.5–5.1)
SODIUM: 136 mmol/L (ref 135–145)

## 2016-07-20 MED ORDER — MORPHINE SULFATE 15 MG PO TABS: 15.0000 mg | ORAL_TABLET | ORAL | 0 refills | Status: DC | PRN

## 2016-07-20 MED ORDER — ASPIRIN 325 MG PO TBEC: 325.0000 mg | DELAYED_RELEASE_TABLET | Freq: Every day | ORAL | 0 refills | Status: DC

## 2016-07-20 NOTE — Evaluation (Signed)
Occupational Therapy Evaluation Patient Details Name: Jesus Brown MRN: [id number] DOB: 06-03-1965 Today's Date: 07/20/2016    History of Present Illness Pt is a 51 yo male admitted for L TKA and recieved cortisone injection in right knee. PMH signifiant for cervical and lumbar fusions, OA bilateral knees.   Clinical Impression   PTA, pt was independent and living with his wife. Currently, pt demonstrates good performing of ADLs and functional mobility with Min guard A. Educated pt on LB dressing techniques and he verbalized understanding; wife will A at home as needed. Also, educated pt on tub transfer with 3N1; pt verbalized understanding. Recommend dc to home once medically stable per physician. Answered all questions and provided education. All acute OT needs met and will sign off. Thank you.    Follow Up Recommendations  No OT follow up;Supervision/Assistance - 24 hour    Equipment Recommendations  3 in 1 bedside commode    Recommendations for Other Services PT consult     Precautions / Restrictions Precautions Precautions: Knee Precaution Booklet Issued: Yes (comment) Precaution Comments: Exercise handout given and reviewed. Instructed on never putting anything under the knee Required Braces or Orthoses: Knee Immobilizer - Left Knee Immobilizer - Left: Other (comment) (no instructions. Worn throughout treatment) Restrictions Weight Bearing Restrictions: Yes LLE Weight Bearing: Weight bearing as tolerated      Mobility Bed Mobility Overal bed mobility: Needs Assistance Bed Mobility: Supine to Sit     Supine to sit: Min guard     General bed mobility comments: In recliner upon arrival  Transfers Overall transfer level: Needs assistance Equipment used: Rolling walker (2 wheeled) Transfers: Sit to/from Stand Sit to Stand: Min guard         General transfer comment: Pt demonstrates good RW management and hand positioning    Balance Overall balance assessment:  No apparent balance deficits (not formally assessed)                                         ADL either performed or assessed with clinical judgement   ADL Overall ADL's : Needs assistance/impaired Eating/Feeding: Set up;Sitting   Grooming: Oral care;Min guard;Standing   Upper Body Bathing: Set up;Sitting   Lower Body Bathing: Min guard;Sit to/from stand   Upper Body Dressing : Set up;Sitting   Lower Body Dressing: Min guard;Sit to/from stand Lower Body Dressing Details (indicate cue type and reason): Pt demonstrated good ROM to adjust socks. Pt states that his wife will A as needed  Toilet Transfer: Min guard;Regular Toilet;RW;Grab bars Armed forces technical officer Details (indicate cue type and reason): Educated pt on toilet transfer technique       Tub/Shower Transfer Details (indicate cue type and reason): Educated pt on tub shower transfer with 3n1. Pt verbalized understanding and discussed strategies for home performance Functional mobility during ADLs: Min guard;Rolling walker General ADL Comments: Pt demonstrating good progress and      Vision         Perception     Praxis      Pertinent Vitals/Pain Pain Assessment: 0-10 Pain Score: 2  Pain Location: left knee Pain Descriptors / Indicators: Aching;Grimacing;Guarding;Sore Pain Intervention(s): Monitored during session     Hand Dominance Right   Extremity/Trunk Assessment Upper Extremity Assessment Upper Extremity Assessment: Overall WFL for tasks assessed   Lower Extremity Assessment Lower Extremity Assessment: Defer to PT evaluation LLE Deficits / Details: pt  with post op pain and weakness. At least 4/5 ankle and 3-/5 knee and hip per gross functional assessment   Cervical / Trunk Assessment Cervical / Trunk Assessment: Normal   Communication Communication Communication: No difficulties   Cognition Arousal/Alertness: Awake/alert Behavior During Therapy: WFL for tasks  assessed/performed Overall Cognitive Status: Within Functional Limits for tasks assessed                                     General Comments  Educated pt on fall prenvention, RW management, and donning/doffing knee immobilizer    Exercises Exercises: Total Joint Total Joint Exercises Ankle Circles/Pumps: AROM;Both;20 reps;Supine Quad Sets: AROM;Left;10 reps;Supine Short Arc Quad: AAROM;AROM;Left;10 reps;Supine Heel Slides: AAROM;Left;10 reps;Supine Hip ABduction/ADduction: AROM;Left;10 reps;Supine Straight Leg Raises: AAROM;Left;10 reps;Supine Goniometric ROM: 2-85   Shoulder Instructions      Home Living Family/patient expects to be discharged to:: Private residence Living Arrangements: Spouse/significant other Available Help at Discharge: Family;Available 24 hours/day Type of Home: House Home Access: Stairs to enter [employer] of Steps: 1 Entrance Stairs-Rails: None Home Layout: Two level;Able to live on main level with bedroom/bathroom     Bathroom Shower/Tub: Teacher, early years/pre: Standard     Home Equipment: Cane - single point;Hand held shower head          Prior Functioning/Environment Level of Independence: Independent        Comments: completely independent and driving. Was not working        OT Problem List: Decreased strength;Decreased activity tolerance;Impaired balance (sitting and/or standing);Decreased knowledge of use of DME or AE;Pain      OT Treatment/Interventions:      OT Goals(Current goals can be found in the care plan section) Acute Rehab OT Goals Patient Stated Goal: to get home today OT Goal Formulation: With patient Time For Goal Achievement: 08/03/16 Potential to Achieve Goals: Good  OT Frequency:     Barriers to D/C:            Co-evaluation              End of Session Equipment Utilized During Treatment: Rolling walker;Other (comment) (pt demonstrated good strength in  standing w/o KI) CPM Left Knee CPM Left Knee: Off Left Knee Flexion (Degrees): 60 Left Knee Extension (Degrees): 0 Nurse Communication: Mobility status  Activity Tolerance: Patient tolerated treatment well Patient left: in chair;with call bell/phone within reach  OT Visit Diagnosis: Unsteadiness on feet (R26.81);Muscle weakness (generalized) (M62.81);Pain Pain - Right/Left: Left Pain - part of body: Knee                Time: 8841-6606 OT Time Calculation (min): 20 min Charges:  OT General Charges $OT Visit: 1 Procedure OT Evaluation $OT Eval Low Complexity: 1 Procedure G-Codes:     Affie Gasner, OTR/L [phone]  [place] 07/20/2016, 9:19 AM

## 2016-07-20 NOTE — Progress Notes (Signed)
Subjective: 1 Day Post-Op Procedure(s) (LRB): LEFT TOTAL KNEE ARTHROPLASTY CORTISON INJECTION IN RIGHT KNEE (Left) Patient reports pain as moderate.   Getting up with PT.Tolerating diet well. No chest pain or SOB. Reports Dr. Louanne Skye stated he would be by this morning to discharge him home .  Objective: Vital signs in last 24 hours: Temp:  [98 F (36.7 C)-99.4 F (37.4 C)] 99.4 F (37.4 C) (04/14 0433) Pulse Rate:  [81-110] 100 (04/14 0433) Resp:  [10-26] 16 (04/13 1745) BP: (98-141)/(59-81) 128/71 (04/14 0433) SpO2:  [93 %-100 %] 96 % (04/14 0433) Weight:  [239 lb 3.2 oz (108.5 kg)] 239 lb 3.2 oz (108.5 kg) (04/13 1045)  Intake/Output from previous day: 04/13 0701 - 04/14 0700 In: 1700 [I.V.:1700] Out: 150 [Blood:150] Intake/Output this shift: No intake/output data recorded.   Recent Labs  07/19/16 1023 07/20/16 0455  HGB 16.1 13.5    Recent Labs  07/19/16 1023 07/20/16 0455  WBC 8.0 16.3*  RBC 5.51 4.67  HCT 47.8 40.7  PLT 236 201    Recent Labs  07/19/16 1023 07/20/16 0455  NA 138 136  K 3.5 3.5  CL 102 100*  CO2 28 26  BUN 20 19  CREATININE 1.10 1.21  GLUCOSE 108* 140*  CALCIUM 9.1 8.8*    Recent Labs  07/19/16 1023  INR 1.06   Lower Extremity: Neurovascular intact Sensation intact distally Dorsiflexion/Plantar flexion intact Incision: dressing C/D/I Compartment soft  Assessment/Plan: 1 Day Post-Op Procedure(s) (LRB): LEFT TOTAL KNEE ARTHROPLASTY CORTISON INJECTION IN RIGHT KNEE (Left) Up with therapy Discharge home with home health  [place] 07/20/2016, 8:05 AM

## 2016-07-20 NOTE — Evaluation (Signed)
Physical Therapy Evaluation Patient Details Name: Jesus Brown MRN: [id number] DOB: 06-Jul-1965 Today's Date: 07/20/2016   History of Present Illness  Pt is a 51 yo male admitted for L TKA and recieved cortisone injection in right knee. PMH signifiant for cervical and lumbar fusions, OA bilateral knees.  Clinical Impression  Pt is POD 1 and moving well with therapy this session. Prior to admission, pt was completely independent with ADLs and IADLs including driving himself. Pt lives with his wife in a two story home with his main living environment on the first level. Pt is able to perform mobility at a Min guard to supervision level this session including gait x 150'. Reviewed all exercises in HEP necessary for DC and advised pt that HHPT would follow-up with progression. Pt will benefit from continued acute PT services to address the below deficits prior to DC if he is not able to return home today.     Follow Up Recommendations Home health PT    Equipment Recommendations  Rolling walker with 5" wheels;3in1 (PT)    Recommendations for Other Services       Precautions / Restrictions Precautions Precautions: Knee Precaution Booklet Issued: Yes (comment) Precaution Comments: Exercise handout given and reviewed. Instructed on never putting anything under the knee Required Braces or Orthoses: Knee Immobilizer - Left Knee Immobilizer - Left: Other (comment) (no instructions. Worn throughout treatment) Restrictions Weight Bearing Restrictions: Yes LLE Weight Bearing: Weight bearing as tolerated      Mobility  Bed Mobility Overal bed mobility: Needs Assistance Bed Mobility: Supine to Sit     Supine to sit: Min guard     General bed mobility comments: Min guard for safety  Transfers Overall transfer level: Needs assistance Equipment used: Rolling walker (2 wheeled) Transfers: Sit to/from Stand Sit to Stand: Min guard         General transfer comment: Min gaurd for safety  from EOB. Cues for hand placement  Ambulation/Gait Ambulation/Gait assistance: Min guard Ambulation Distance (Feet): 150 Feet Assistive device: Rolling walker (2 wheeled) Gait Pattern/deviations: Step-to pattern;Step-through pattern;Decreased weight shift to left;Decreased step length - right;Decreased stance time - left;Antalgic Gait velocity: decreased Gait velocity interpretation: Below normal speed for age/gender General Gait Details: Moderate antalgic gait initially and improved to min. Cues for heel strike and tightening quad through stance phase in order to improve stability with gait on LLE.   Stairs            Wheelchair Mobility    Modified Rankin (Stroke Patients Only)       Balance Overall balance assessment: No apparent balance deficits (not formally assessed)                                           Pertinent Vitals/Pain Pain Assessment: 0-10 Pain Score: 4  Pain Location: left knee Pain Descriptors / Indicators: Aching;Grimacing;Guarding;Sore Pain Intervention(s): Monitored during session;Premedicated before session;Ice applied    Home Living Family/patient expects to be discharged to:: Private residence Living Arrangements: Spouse/significant other Available Help at Discharge: Family;Available 24 hours/day Type of Home: House Home Access: Stairs to enter Entrance Stairs-Rails: None Entrance Stairs-Number of Steps: 1 Home Layout: Two level;Able to live on main level with bedroom/bathroom Home Equipment: Kasandra Knudsen - single point      Prior Function Level of Independence: Independent         Comments: completely independent and  driving. Was not working     Hand Dominance   Dominant Hand: Right    Extremity/Trunk Assessment   Upper Extremity Assessment Upper Extremity Assessment: Defer to OT evaluation    Lower Extremity Assessment Lower Extremity Assessment: LLE deficits/detail LLE Deficits / Details: pt with post op pain  and weakness. At least 4/5 ankle and 3-/5 knee and hip per gross functional assessment    Cervical / Trunk Assessment Cervical / Trunk Assessment: Normal  Communication   Communication: No difficulties  Cognition Arousal/Alertness: Awake/alert Behavior During Therapy: WFL for tasks assessed/performed Overall Cognitive Status: Within Functional Limits for tasks assessed                                        General Comments      Exercises Total Joint Exercises Ankle Circles/Pumps: AROM;Both;20 reps;Supine Quad Sets: AROM;Left;10 reps;Supine Short Arc Quad: AAROM;AROM;Left;10 reps;Supine Heel Slides: AAROM;Left;10 reps;Supine Hip ABduction/ADduction: AROM;Left;10 reps;Supine Straight Leg Raises: AAROM;Left;10 reps;Supine Goniometric ROM: 2-85   Assessment/Plan    PT Assessment Patient needs continued PT services  PT Problem List Decreased strength;Decreased range of motion;Decreased activity tolerance;Decreased balance;Decreased mobility;Decreased knowledge of use of DME;Pain       PT Treatment Interventions DME instruction;Gait training;Stair training;Functional mobility training;Therapeutic activities;Therapeutic exercise;Balance training;Patient/family education    PT Goals (Current goals can be found in the Care Plan section)  Acute Rehab PT Goals Patient Stated Goal: to get home today PT Goal Formulation: With patient Time For Goal Achievement: 07/27/16 Potential to Achieve Goals: Good    Frequency 7X/week   Barriers to discharge        Co-evaluation               End of Session Equipment Utilized During Treatment: Gait belt;Left knee immobilizer Activity Tolerance: Patient tolerated treatment well Patient left: in chair;with call bell/phone within reach Nurse Communication: Mobility status PT Visit Diagnosis: Difficulty in walking, not elsewhere classified (R26.2);Pain Pain - Right/Left: Left Pain - part of body: Knee    Time:  1610-9604 PT Time Calculation (min) (ACUTE ONLY): 44 min   Charges:   PT Evaluation $PT Eval Moderate Complexity: 1 Procedure PT Treatments $Gait Training: 8-22 mins $Therapeutic Exercise: 8-22 mins   PT G Codes:   PT G-Codes **NOT FOR INPATIENT CLASS** Functional Assessment Tool Used: AM-PAC 6 Clicks Basic Mobility;Clinical judgement Functional Limitation: Mobility: Walking and moving around Mobility: Walking and Moving Around Current Status ([id number]): At least 20 percent but less than 40 percent impaired, limited or restricted Mobility: Walking and Moving Around Goal Status [phone]): 0 percent impaired, limited or restricted    Scheryl Marten PT, DPT  [phone]   Shanon Rosser 07/20/2016, 8:46 AM

## 2016-07-20 NOTE — Progress Notes (Signed)
D/c instructions, RX's and follow up appts explained and provided to patient and wife verbalized understanding. [facility] set up for patient contact information provided, 3in1 and walker delivered to room sent home with patient. Patient left floor via wheelchair accompanied by staff.    Aaryn Sermon, Tivis Ringer, RN

## 2016-07-20 NOTE — Care Management Note (Signed)
Case Management Note  Patient Details  Name: Jesus Brown MRN: [id number] Date of Birth: 03/20/1966  Subjective/Objective:                   Primary localized osteoarthritis of left knee Action/Plan: Discharge planning Expected Discharge Date:  07/19/16               Expected Discharge Plan:  Home w Home Health Services  In-House Referral:     Discharge planning Services  CM Consult  Post Acute Care Choice:  Home Health Choice offered to:  Patient, Spouse  DME Arranged:  3-N-1, CPM, Walker rolling DME Agency:  [facility] [employer]  HH Arranged:  PT HH Agency:  [facility]  Status of Service:  Completed, signed off  If discussed at Long Length of Stay Meetings, dates discussed:    Additional Comments: CM met with pt and pt's spouse in room to offer choice of home health agency. Family chooses [facility] to render HHPT. Referral called to [facility] rep, [name].  CM called [employer] to arrange for CPM and faxed facesheet. CM notified [facility] DME rep, reggie to please deliver the rolling walker and 3n1.  No other CM needs were communicated. [name], Sarah Christine, RN 07/20/2016, 10:59 AM  

## 2016-07-20 NOTE — Discharge Summary (Signed)
Physician Discharge Summary      Patient ID: Jesus Brown MRN: [id number] DOB/AGE: [id number] 53 y.o.  Admit date: 07/19/2016 Discharge date:07/20/2016   Admission Diagnoses:  Principal Problem:   Bilateral primary osteoarthritis of knee Active Problems:   Primary localized osteoarthritis of left knee   Discharge Diagnoses:  Same  Past Medical History:  Diagnosis Date  . Anxiety    mainly r/t pain  . Arthritis    neck, knees  . Asthma    AS CHILD   . Back pain, chronic   . DDD (degenerative disc disease)   . Dysphagia   . 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 GERD (gastroesophageal reflux disease)   . Hypertension   . Hypotestosteronemia 09/08/2015  . Neck pain, chronic     Surgeries: Procedure(s): LEFT TOTAL KNEE ARTHROPLASTY CORTISON INJECTION IN RIGHT KNEE on 07/19/2016   Consultants:   Discharged Condition: Improved  Hospital Course: Jesus Brown is an 51 y.o. male who was admitted 07/19/2016 with a chief complaint of No chief complaint on file. , and found to have a diagnosis of Bilateral primary osteoarthritis of knee.  He was brought to the operating room on 07/19/2016 and underwent the above named procedures.    He was given perioperative antibiotics:  Anti-infectives    Start     Dose/Rate Route Frequency Ordered Stop   07/19/16 1300  vancomycin (VANCOCIN) IVPB 1000 mg/200 mL premix     1,000 mg 200 mL/hr over 60 Minutes Intravenous  Once 07/19/16 1250 07/19/16 1254   07/12/16 1200  ceFAZolin (ANCEF) IVPB 2g/100 mL premix  Status:  Discontinued     2 g 200 mL/hr over 30 Minutes Intravenous To ShortStay Surgical 07/11/16 0901 07/11/16 0904   07/12/16 1000  vancomycin (VANCOCIN) 1,500 mg in sodium chloride 0.9 % 500 mL IVPB     1,500 mg 250 mL/hr over 120 Minutes Intravenous To ShortStay Surgical 07/11/16 0908 07/13/16 1000    Recovered uneventfully in PACU and was discharged to med-surg floor. Oral and IV medications for pain, he was  tolerating po medications and nourishment. POD# 1 awake, Alert and oriented x 4. Dressing intact left knee, small amount of drainage at the distal dressing, dry blood. VSS. Did well with PT and OT and is agreeable to discharge today. Will prescribe additional morphine IR 15 mg tablets to be taken every 3-4 hours to decrease any break through, will call pain management on Monday if the medications are not adequate.   He was given sequential compression devices, early ambulation, and chemoprophylaxis for DVT prophylaxis.  He benefited maximally from their hospital stay and there were no complications.    Recent vital signs:  Vitals:   07/20/16 0007 07/20/16 0433  BP: (!) 114/59 128/71  Pulse: (!) 110 100  Resp:    Temp: 98.7 F (37.1 C) 99.4 F (37.4 C)    Recent laboratory studies:  Results for orders placed or performed during the hospital encounter of 07/19/16  CBC  Result Value Ref Range   WBC 8.0 4.0 - 10.5 K/uL   RBC 5.51 4.22 - 5.81 MIL/uL   Hemoglobin 16.1 13.0 - 17.0 g/dL   HCT 47.8 39.0 - 52.0 %   MCV 86.8 78.0 - 100.0 fL   MCH 29.2 26.0 - 34.0 pg   MCHC 33.7 30.0 - 36.0 g/dL   RDW 13.2 11.5 - 15.5 %   Platelets 236 150 - 400 K/uL  Comprehensive metabolic panel  Result Value Ref  Range   Sodium 138 135 - 145 mmol/L   Potassium 3.5 3.5 - 5.1 mmol/L   Chloride 102 101 - 111 mmol/L   CO2 28 22 - 32 mmol/L   Glucose, Bld 108 (H) 65 - 99 mg/dL   BUN 20 6 - 20 mg/dL   Creatinine, Ser 1.10 0.61 - 1.24 mg/dL   Calcium 9.1 8.9 - 10.3 mg/dL   Total Protein 6.8 6.5 - 8.1 g/dL   Albumin 4.1 3.5 - 5.0 g/dL   AST 39 15 - 41 U/L   ALT 48 17 - 63 U/L   Alkaline Phosphatase 72 38 - 126 U/L   Total Bilirubin 1.0 0.3 - 1.2 mg/dL   GFR calc non Af Amer >60 >60 mL/min   GFR calc Af Amer >60 >60 mL/min   Anion gap 8 5 - 15  Protime-INR  Result Value Ref Range   Prothrombin Time 13.9 11.4 - 15.2 seconds   INR 1.06   APTT  Result Value Ref Range   aPTT 29 24 - 36 seconds  CBC    Result Value Ref Range   WBC 16.3 (H) 4.0 - 10.5 K/uL   RBC 4.67 4.22 - 5.81 MIL/uL   Hemoglobin 13.5 13.0 - 17.0 g/dL   HCT 40.7 39.0 - 52.0 %   MCV 87.2 78.0 - 100.0 fL   MCH 28.9 26.0 - 34.0 pg   MCHC 33.2 30.0 - 36.0 g/dL   RDW 12.9 11.5 - 15.5 %   Platelets 201 150 - 400 K/uL  Basic metabolic panel  Result Value Ref Range   Sodium 136 135 - 145 mmol/L   Potassium 3.5 3.5 - 5.1 mmol/L   Chloride 100 (L) 101 - 111 mmol/L   CO2 26 22 - 32 mmol/L   Glucose, Bld 140 (H) 65 - 99 mg/dL   BUN 19 6 - 20 mg/dL   Creatinine, Ser 1.21 0.61 - 1.24 mg/dL   Calcium 8.8 (L) 8.9 - 10.3 mg/dL   GFR calc non Af Amer >60 >60 mL/min   GFR calc Af Amer >60 >60 mL/min   Anion gap 10 5 - 15  Type and screen Order type and screen if day of surgery is less than 15 days from draw of preadmission visit or order morning of surgery if day of surgery is greater than 6 days from preadmission visit.  Result Value Ref Range   ABO/RH(D) O NEG    Antibody Screen NEG    Sample Expiration 07/22/2016     Discharge Medications:   Allergies as of 07/20/2016      Reactions   Baclofen Nausea Only, Other (See Comments)   Aches and pains    Indocin [indomethacin] Other (See Comments)   Burns stomach   Adhesive [tape] Other (See Comments)   Blisters, paper tape is ok      Medication List    TAKE these medications   ANDROGEL 20.25 MG/1.25GM (1.62%) Gel Generic drug:  Testosterone Place 2 application onto the skin daily.   aspirin 325 MG EC tablet Take 1 tablet (325 mg total) by mouth daily with breakfast. Start taking on:  07/21/2016   cyclobenzaprine 5 MG tablet Commonly known as:  FLEXERIL Take 5 mg by mouth daily as needed for muscle spasms.   EPIPEN 2-PAK 0.3 mg/0.3 mL Soaj injection Generic drug:  EPINEPHrine Inject 0.3 mg into the muscle as needed (allergic reaction).   GOODY HEADACHE PO Take 1 packet by mouth daily as needed (pain).  hydrochlorothiazide 25 MG tablet Commonly known as:   HYDRODIURIL Take 25 mg by mouth daily.   loratadine 10 MG tablet Commonly known as:  CLARITIN Take 10 mg by mouth daily.   LORazepam 0.5 MG tablet Commonly known as:  ATIVAN Take 0.5 mg by mouth every 8 (eight) hours as needed for anxiety.   morphine 15 MG tablet Commonly known as:  MSIR Take 1 tablet (15 mg total) by mouth every 4 (four) hours as needed (pai). What changed:  when to take this  reasons to take this   morphine 60 MG 24 hr capsule Commonly known as:  KADIAN Take 60 mg by mouth every morning. What changed:  Another medication with the same name was added. Make sure you understand how and when to take each.  Another medication with the same name was changed. Make sure you understand how and when to take each.   morphine 15 MG tablet Commonly known as:  MSIR Take 1 tablet (15 mg total) by mouth every 4 (four) hours as needed (pai). What changed:  You were already taking a medication with the same name, and this prescription was added. Make sure you understand how and when to take each.   mupirocin cream 2 % Commonly known as:  BACTROBAN Apply 1 application topically 2 (two) times daily. With a Q tip apply to the nares BID up to the day of surgery.   omeprazole 20 MG capsule Commonly known as:  PRILOSEC Take 20 mg by mouth daily.   PRESCRIPTION MEDICATION Inject 2 Doses as directed once a week. Allergy shots   valsartan 160 MG tablet Commonly known as:  DIOVAN Take 160 mg by mouth daily.            Durable Medical Equipment        Start     Ordered   07/19/16 1807  DME Walker rolling  Once    Question Answer Comment  Patient needs a walker to treat with the following condition Osteoarthritis of left knee   Patient needs a walker to treat with the following condition Status post total left knee replacement      07/19/16 1807   07/19/16 1807  DME 3 n 1  Once     07/19/16 1807      Diagnostic Studies: Dg Chest 2 View  Result Date:  07/19/2016 CLINICAL DATA:  Preop for knee replacement EXAM: CHEST  2 VIEW COMPARISON:  07/26/2012 FINDINGS: Cardiomediastinal silhouette is stable. No infiltrate or pleural effusion. No pulmonary edema. Mild degenerative changes mid thoracic spine. IMPRESSION: No active cardiopulmonary disease. Mild degenerative changes mid thoracic spine. Electronically Signed   By: Lahoma Crocker M.D.   On: 07/19/2016 11:01    Disposition: 01-Home or Self Care  Discharge Instructions    CPM    Complete by:  As directed    Continuous passive motion machine (CPM):      Use the CPM from 0 to 60 for 8 hours per day.      You may increase by 10 degrees per day.  You may break it up into 2 or 3 sessions per day.      Use CPM for 3-4 weeks or until you are told to stop.   Call MD / Call 911    Complete by:  As directed    If you experience chest pain or shortness of breath, CALL 911 and be transported to the hospital emergency room.  If you develope a fever  above 101 F, pus (white drainage) or increased drainage or redness at the wound, or calf pain, call your surgeon's office.   Change dressing    Complete by:  As directed    Change dressing on 4/17, then change the dressing daily with sterile 4 x 4 inch gauze dressing and apply TED hose.  You may clean the incision with alcohol prior to redressing.   Constipation Prevention    Complete by:  As directed    Drink plenty of fluids.  Prune juice may be helpful.  You may use a stool softener, such as Colace (over the counter) 100 mg twice a day.  Use MiraLax (over the counter) for constipation as needed.   Diet - low sodium heart healthy    Complete by:  As directed    Discharge instructions    Complete by:  As directed    Keep knee incision dry for 5 days post op then may wet while bathing. Therapy daily and CPM goal full extension and greater than 90 degrees flexion. Call if fever or chills or increased drainage. Go to ER if acutely short of breath or call for  ambulance. Return for follow up in 2 weeks. May full weight bear on the surgical leg unless told otherwise. Use knee immobilizer until able to straight leg raise off bed with knee stable. In house walking for first 2 weeks. INSTRUCTIONS AFTER JOINT REPLACEMENT   Remove items at home which could result in a fall. This includes throw rugs or furniture in walking pathways ICE to the affected joint every three hours while awake for 30 minutes at a time, for at least the first 3-5 days, and then as needed for pain and swelling.  Continue to use ice for pain and swelling. You may notice swelling that will progress down to the foot and ankle.  This is normal after surgery.  Elevate your leg when you are not up walking on it.   Continue to use the breathing machine you got in the hospital (incentive spirometer) which will help keep your temperature down.  It is common for your temperature to cycle up and down following surgery, especially at night when you are not up moving around and exerting yourself.  The breathing machine keeps your lungs expanded and your temperature down.   DIET:  As you were doing prior to hospitalization, we recommend a well-balanced diet.  DRESSING / WOUND CARE / SHOWERING  You may change your dressing 3-5 days after surgery.  Then change the dressing every day with sterile gauze.  Please use good hand washing techniques before changing the dressing.  Do not use any lotions or creams on the incision until instructed by your surgeon.  ACTIVITY  Increase activity slowly as tolerated, but follow the weight bearing instructions below.   No driving for 6 weeks or until further direction given by your physician.  You cannot drive while taking narcotics.  No lifting or carrying greater than 10 lbs. until further directed by your surgeon. Avoid periods of inactivity such as sitting longer than an hour when not asleep. This helps prevent blood clots.  You may return to work once you  are authorized by your doctor.     WEIGHT BEARING   Weight bearing as tolerated with assist device (walker, cane, etc) as directed, use it as long as suggested by your surgeon or therapist, typically at least 4-6 weeks.   EXERCISES  Results after joint replacement surgery are often greatly  improved when you follow the exercise, range of motion and muscle strengthening exercises prescribed by your doctor. Safety measures are also important to protect the joint from further injury. Any time any of these exercises cause you to have increased pain or swelling, decrease what you are doing until you are comfortable again and then slowly increase them. If you have problems or questions, call your caregiver or physical therapist for advice.   Rehabilitation is important following a joint replacement. After just a few days of immobilization, the muscles of the leg can become weakened and shrink (atrophy).  These exercises are designed to build up the tone and strength of the thigh and leg muscles and to improve motion. Often times heat used for twenty to thirty minutes before working out will loosen up your tissues and help with improving the range of motion but do not use heat for the first two weeks following surgery (sometimes heat can increase post-operative swelling).   These exercises can be done on a training (exercise) mat, on the floor, on a table or on a bed. Use whatever works the best and is most comfortable for you.    Use music or television while you are exercising so that the exercises are a pleasant break in your day. This will make your life better with the exercises acting as a break in your routine that you can look forward to.   Perform all exercises about fifteen times, three times per day or as directed.  You should exercise both the operative leg and the other leg as well.  Exercises include:   Quad Sets - Tighten up the muscle on the front of the thigh (Quad) and hold for 5-10  seconds.   Straight Leg Raises - With your knee straight (if you were given a brace, keep it on), lift the leg to 60 degrees, hold for 3 seconds, and slowly lower the leg.  Perform this exercise against resistance later as your leg gets stronger.  Leg Slides: Lying on your back, slowly slide your foot toward your buttocks, bending your knee up off the floor (only go as far as is comfortable). Then slowly slide your foot back down until your leg is flat on the floor again.  Angel Wings: Lying on your back spread your legs to the side as far apart as you can without causing discomfort.  Hamstring Strength:  Lying on your back, push your heel against the floor with your leg straight by tightening up the muscles of your buttocks.  Repeat, but this time bend your knee to a comfortable angle, and push your heel against the floor.  You may put a pillow under the heel to make it more comfortable if necessary.   A rehabilitation program following joint replacement surgery can speed recovery and prevent re-injury in the future due to weakened muscles. Contact your doctor or a physical therapist for more information on knee rehabilitation.    CONSTIPATION  Constipation is defined medically as fewer than three stools per week and severe constipation as less than one stool per week.  Even if you have a regular bowel pattern at home, your normal regimen is likely to be disrupted due to multiple reasons following surgery.  Combination of anesthesia, postoperative narcotics, change in appetite and fluid intake all can affect your bowels.   YOU MUST use at least one of the following options; they are listed in order of increasing strength to get the job done.  They are all  available over the counter, and you may need to use some, POSSIBLY even all of these options:    Drink plenty of fluids (prune juice may be helpful) and high fiber foods Colace 100 mg by mouth twice a day  Senokot for constipation as directed and  as needed Dulcolax (bisacodyl), take with full glass of water  Miralax (polyethylene glycol) once or twice a day as needed.  If you have tried all these things and are unable to have a bowel movement in the first 3-4 days after surgery call either your surgeon or your primary doctor.    If you experience loose stools or diarrhea, hold the medications until you stool forms back up.  If your symptoms do not get better within 1 week or if they get worse, check with your doctor.  If you experience "the worst abdominal pain ever" or develop nausea or vomiting, please contact the office immediately for further recommendations for treatment.   ITCHING:  If you experience itching with your medications, try taking only a single pain pill, or even half a pain pill at a time.  You can also use Benadryl over the counter for itching or also to help with sleep.   TED HOSE STOCKINGS:  Use stockings on both legs until for at least 2 weeks or as directed by physician office. They may be removed at night for sleeping.  MEDICATIONS:  See your medication summary on the "After Visit Summary" that nursing will review with you.  You may have some home medications which will be placed on hold until you complete the course of blood thinner medication.  It is important for you to complete the blood thinner medication as prescribed.  PRECAUTIONS:  If you experience chest pain or shortness of breath - call 911 immediately for transfer to the hospital emergency department.   If you develop a fever greater that 101 F, purulent drainage from wound, increased redness or drainage from wound, foul odor from the wound/dressing, or calf pain - CONTACT YOUR SURGEON.                                                   FOLLOW-UP APPOINTMENTS:  If you do not already have a post-op appointment, please call the office for an appointment to be seen by your surgeon.  Guidelines for how soon to be seen are listed in your "After Visit Summary",  but are typically between 1-4 weeks after surgery.  OTHER INSTRUCTIONS:   Knee Replacement:  Do not place pillow under knee, focus on keeping the knee straight while resting. CPM instructions: 0-90 degrees, 2 hours in the morning, 2 hours in the afternoon, and 2 hours in the evening. Place foam block, curve side up under heel at all times except when in CPM or when walking.  DO NOT modify, tear, cut, or change the foam block in any way.  MAKE SURE YOU:  Understand these instructions.  Get help right away if you are not doing well or get worse.    Thank you for letting us be a part of your medical care team.  It is a privilege we respect greatly.  We hope these instructions will help you stay on track for a fast and full recovery!   Do not put a pillow under the knee. Place it under the heel.  Complete by:  As directed    Driving restrictions    Complete by:  As directed    No driving for 6 weeks   Increase activity slowly as tolerated    Complete by:  As directed    Lifting restrictions    Complete by:  As directed    No lifting for 8 weeks   TED hose    Complete by:  As directed    Use stockings (TED hose) for 6 weeks on both leg(s).  You may remove them at night for sleeping.      Follow-up Information    Jessy Oto, MD In 2 weeks.   Specialty:  Orthopedic Surgery Why:  For wound re-check Contact information: [place] [address] [phone]            Signed: MCCOY TESTA 07/20/2016, 9:47 AM

## 2016-07-22 ENCOUNTER — Encounter (HOSPITAL_COMMUNITY): Payer: Self-pay | Admitting: Specialist

## 2016-07-23 NOTE — Progress Notes (Signed)
G-Code Late Entry OT Evaluation 2016/08/14    08-17-2016 0700  OT G-codes **NOT FOR INPATIENT CLASS**  Functional Assessment Tool Used Clinical judgement  Functional Limitation Self care  Self Care Current Status [phone]) CI  Self Care Goal Status (S1115) CI  Self Care Discharge Status [phone]) [place], OTR/L [phone]

## 2016-07-25 ENCOUNTER — Encounter (HOSPITAL_COMMUNITY): Payer: Self-pay | Admitting: Cardiology

## 2016-07-25 ENCOUNTER — Encounter (INDEPENDENT_AMBULATORY_CARE_PROVIDER_SITE_OTHER): Payer: Self-pay

## 2016-07-25 ENCOUNTER — Ambulatory Visit (HOSPITAL_COMMUNITY)
Admission: RE | Admit: 2016-07-25 | Discharge: 2016-07-25 | Disposition: A | Discharge status: Home / Self Care | Payer: BLUE CROSS/BLUE SHIELD | Source: Ambulatory Visit | Attending: Cardiology | Admitting: Cardiology

## 2016-07-25 ENCOUNTER — Ambulatory Visit (INDEPENDENT_AMBULATORY_CARE_PROVIDER_SITE_OTHER): Payer: Medicare Other | Admitting: Specialist

## 2016-07-25 ENCOUNTER — Telehealth (INDEPENDENT_AMBULATORY_CARE_PROVIDER_SITE_OTHER): Payer: Self-pay

## 2016-07-25 ENCOUNTER — Ambulatory Visit (INDEPENDENT_AMBULATORY_CARE_PROVIDER_SITE_OTHER): Payer: Medicare Other

## 2016-07-25 ENCOUNTER — Encounter (INDEPENDENT_AMBULATORY_CARE_PROVIDER_SITE_OTHER): Payer: Self-pay | Admitting: Specialist

## 2016-07-25 ENCOUNTER — Inpatient Hospital Stay (INDEPENDENT_AMBULATORY_CARE_PROVIDER_SITE_OTHER): Payer: Medicare Other | Admitting: Specialist

## 2016-07-25 VITALS — BP 124/76 | HR 125 | Ht 68.0 in | Wt 230.0 lb

## 2016-07-25 DIAGNOSIS — I10 Essential (primary) hypertension: Secondary | ICD-10-CM | POA: Diagnosis not present

## 2016-07-25 DIAGNOSIS — M7989 Other specified soft tissue disorders: Secondary | ICD-10-CM

## 2016-07-25 DIAGNOSIS — Z96652 Presence of left artificial knee joint: Secondary | ICD-10-CM

## 2016-07-25 DIAGNOSIS — M79662 Pain in left lower leg: Secondary | ICD-10-CM | POA: Insufficient documentation

## 2016-07-25 LAB — CBC WITH DIFFERENTIAL/PLATELET
BASOS PCT: 0 %
Basophils Absolute: 0 cells/uL (ref 0–200)
EOS ABS: 284 {cells}/uL (ref 15–500)
Eosinophils Relative: 2 %
HCT: 44.3 % (ref 38.5–50.0)
Hemoglobin: 14.7 g/dL (ref 13.2–17.1)
LYMPHS PCT: 12 %
Lymphs Abs: 1704 cells/uL (ref 850–3900)
MCH: 28.9 pg (ref 27.0–33.0)
MCHC: 33.2 g/dL (ref 32.0–36.0)
MCV: 87.2 fL (ref 80.0–100.0)
MONOS PCT: 8 %
MPV: 9.4 fL (ref 7.5–12.5)
Monocytes Absolute: 1136 cells/uL — ABNORMAL HIGH (ref 200–950)
NEUTROS ABS: 11076 {cells}/uL — AB (ref 1500–7800)
Neutrophils Relative %: 78 %
PLATELETS: 361 10*3/uL (ref 140–400)
RBC: 5.08 MIL/uL (ref 4.20–5.80)
RDW: 13.1 % (ref 11.0–15.0)
WBC: 14.2 10*3/uL — ABNORMAL HIGH (ref 3.8–10.8)

## 2016-07-25 NOTE — Telephone Encounter (Signed)
Per [place] Crown, Patient needs to be seen, patient worked in today for 130pm

## 2016-07-25 NOTE — Telephone Encounter (Signed)
Having some bleeding under the bandage, also having a lot of pain and swelling down both legs. She is going to put his Ted hose on him this morning, she wants you to call patient and discuss Patient # [phone] [facility]- [phone]

## 2016-07-25 NOTE — Progress Notes (Signed)
Venous duplex negative for DVT. 

## 2016-07-25 NOTE — Progress Notes (Signed)
Post-Op Visit Note   Patient: Jesus Brown           Date of Birth: 12-05-1965           MRN: [id number] Visit Date: 07/25/2016 PCP: Merrilee Seashore, MD   Assessment & Plan:  Chief Complaint:  Chief Complaint  Patient presents with  . Left Knee - Routine Post Op   Visit Diagnoses:  1. Presence of left artificial knee joint   2. Pain and swelling of left lower leg    Patient returns for an earlier appointment is scheduled due to complaints of left greater than right ankle/foot pain. 6 days status post left total knee replacement. Says that rehabilitation for his knee has been going very well and he was ambulating with a cane up until yesterday when he had sudden onset of left greater than right foot and ankle pain. Marked pain on the left with weightbearing. Is also has some pain in his left calf with swelling. States that his left knee is doing well except for some blistering anterior aspect. Blistering is in the location of the benzoin and Steri-Strips that I placed during surgery. No points of fever chills chest pain soreness of breath. He denies personal history of gout but does admit to his grandfather having this.   Plan: Patient was scheduled for venous Doppler left lower extremity to rule out DVT. This will be done this afternoon and report will be called back to Dr. Louanne Skye or myself. Blood work today to check CBC with differential, arthritis panel and CRP. Patient's sudden onset of foot and ankle pain may be gout attack. Patient will come back to the office tomorrow morning to see me for recheck.  Follow-Up Instructions: No Follow-up on file.   Orders:  Orders Placed This Encounter  Procedures  . XR Knee 1-2 Views Left  . CBC with Differential  . Sed Rate (ESR)  . Uric acid  . C-reactive protein  . Rheumatoid Factor  . Antinuclear Antib (ANA)   No orders of the defined types were placed in this encounter.   Imaging: Xr Knee 1-2 Views Left  Result Date:  07/25/2016 X-ray left knee shows good alignment and seating of the prosthesis. No complicating features.   PMFS History: Patient Active Problem List   Diagnosis Date Noted  . Presence of left artificial knee joint 07/25/2016  . Pain and swelling of left lower leg 07/25/2016  . Bilateral primary osteoarthritis of knee 07/19/2016    Class: Chronic  . Primary localized osteoarthritis of left knee 07/19/2016  . 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 Hypotestosteronemia 09/08/2015  . Cervical spondylosis without myelopathy 03/25/2014    Class: Chronic  . Left cervical radiculopathy 03/25/2014    Class: Chronic  . Cervical spondylosis with radiculopathy 03/25/2014   Past Medical History:  Diagnosis Date  . Anxiety    mainly r/t pain  . Arthritis    neck, knees  . Asthma    AS CHILD   . Back pain, chronic   . DDD (degenerative disc disease)   . Dysphagia   . 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 GERD (gastroesophageal reflux disease)   . Hypertension   . Hypotestosteronemia 09/08/2015  . Neck pain, chronic     No family history on file.  Past Surgical History:  Procedure Laterality Date  . ANKLE SURGERY    . BACK SURGERY  2010,2011   lumb fusionx2  . CERVICAL FUSION  1497,0263  3 surgeries  . DIRECT LARYNGOSCOPY N/A 11/09/2012   Procedure: DIRECT LARYNGOSCOPY;  Surgeon: Jodi Marble, MD;  Location: [place];  Service: ENT;  Laterality: N/A;  . ESOPHAGOGASTRODUODENOSCOPY N/A 08/03/2012   Procedure: ESOPHAGOGASTRODUODENOSCOPY (EGD);  Surgeon: Arta Silence, MD;  Location[facility] ENDOSCOPY;  Service: Endoscopy;  Laterality: N/A;  . ESOPHAGOSCOPY W/ BOTOX INJECTION N/A 11/09/2012   Procedure: ESOPHAGOSCOPY POSSIBLE BIOPSY AND POSSIBLE BOTOX INJECTION CRICOPHARYNGEUS;  Surgeon: Jodi Marble, MD;  Location: [place];  Service: ENT;  Laterality: N/A;  Esophagoscopy with botox injection  . HERNIA REPAIR     ing-rt  .  KNEE SURGERY     rt and left x2 each=4  . POSTERIOR CERVICAL FUSION/FORAMINOTOMY N/A 03/25/2014   Procedure: LEFT C7-T1 FORAMINOTOMY;  Surgeon: Jessy Oto, MD;  Location: [place];  Service: Orthopedics;  Laterality: N/A;  . [place]   rt and lt  . TONSILLECTOMY    . TOTAL KNEE ARTHROPLASTY Left 07/19/2016   Procedure: LEFT TOTAL KNEE ARTHROPLASTY CORTISON INJECTION IN RIGHT KNEE;  Surgeon: Jessy Oto, MD;  Location: [place];  Service: Orthopedics;  Laterality: Left;  MAY NEED RNFA TO FINISH CASE PER [place]   . WRIST SURGERY     lt and rt   Social History   Occupational History  . Not on file.   Social History Main Topics  . Smoking status: Never Smoker  . Smokeless tobacco: Never Used  . Alcohol use No  . Drug use: No  . Sexual activity: Not on file   Exam: Pleasant white male alert and oriented 3. Gait is considerably antalgic with a walker and he is not wanting to put any weight on the left foot. No respiratory distress. Left knee range of motion about 0-100. He does have some blistering where the benzoin and Steri-Strips were placed.. There are no gross signs of infection. Does have some knee swelling although not extreme. Some swelling  proximal calf to foot. Moderate moderate left calf tenderness. He is exquisitely tender at the left anterior tibiotalar joint line. Less tender at the midfoot. Nontender first MTP joint. Mild tenderness right ankle joint.

## 2016-07-26 ENCOUNTER — Encounter (HOSPITAL_COMMUNITY): Payer: BLUE CROSS/BLUE SHIELD

## 2016-07-26 ENCOUNTER — Ambulatory Visit (INDEPENDENT_AMBULATORY_CARE_PROVIDER_SITE_OTHER): Payer: Medicare Other | Admitting: Surgery

## 2016-07-26 ENCOUNTER — Encounter (INDEPENDENT_AMBULATORY_CARE_PROVIDER_SITE_OTHER): Payer: Self-pay | Admitting: Surgery

## 2016-07-26 VITALS — BP 140/78 | HR 98 | Temp 97.7°F | Ht 68.0 in | Wt 230.0 lb

## 2016-07-26 DIAGNOSIS — Z96652 Presence of left artificial knee joint: Secondary | ICD-10-CM

## 2016-07-26 LAB — URIC ACID: URIC ACID, SERUM: 7.9 mg/dL (ref 4.0–8.0)

## 2016-07-26 LAB — C-REACTIVE PROTEIN: CRP: 42.5 mg/L — AB (ref <8.0)

## 2016-07-26 LAB — SEDIMENTATION RATE: Sed Rate: 14 mm/hr (ref 0–20)

## 2016-07-26 LAB — ANA: Anti Nuclear Antibody(ANA): NEGATIVE

## 2016-07-26 LAB — RHEUMATOID FACTOR

## 2016-07-26 MED ORDER — CEPHALEXIN 500 MG PO CAPS: 500.0000 mg | ORAL_CAPSULE | Freq: Three times a day (TID) | ORAL | 0 refills | Status: DC

## 2016-07-29 ENCOUNTER — Encounter (INDEPENDENT_AMBULATORY_CARE_PROVIDER_SITE_OTHER): Payer: Self-pay | Admitting: Specialist

## 2016-07-29 ENCOUNTER — Ambulatory Visit (INDEPENDENT_AMBULATORY_CARE_PROVIDER_SITE_OTHER): Payer: Medicare Other | Admitting: Specialist

## 2016-07-29 VITALS — BP 122/76 | HR 106 | Temp 97.1°F | Ht 68.0 in | Wt 230.0 lb

## 2016-07-29 DIAGNOSIS — Z91048 Other nonmedicinal substance allergy status: Secondary | ICD-10-CM

## 2016-07-29 DIAGNOSIS — Z96652 Presence of left artificial knee joint: Secondary | ICD-10-CM

## 2016-07-29 NOTE — Progress Notes (Signed)
Post-Op Visit Note   Patient: Jesus Brown           Date of Birth: 1965/09/04           MRN: [id number] Visit Date: 07/26/2016 PCP: Merrilee Seashore, MD   Assessment & Plan:  Chief Complaint:  Chief Complaint  Patient presents with  . Left Knee - Routine Post Op, Follow-up  Patient states that he is doing better today. He is able to and plate with a cane. Left ankle pain also better but now the pain has moved into the mid foot and around his big toe. Labs are pending. Visit Diagnoses: No diagnosis found.  Plan: Patient given prescription for Keflex. I do not think that he has a superficial skin infection but this is more preventative with the blisters on his knee. We'll follow-up Monday afternoon for recheck. All questions answered. Patient given a prescription for bilateral thigh-high TED hose to get from [facility] supply.  Follow-Up Instructions: Return in about 3 days (around 07/29/2016) for appointment 1:30 (per [name]).   Orders:  No orders of the defined types were placed in this encounter.  Meds ordered this encounter  Medications  . cephALEXin (KEFLEX) 500 MG capsule    Sig: Take 1 capsule (500 mg total) by mouth every 8 (eight) hours.    Dispense:  21 capsule    Refill:  0    Imaging: No results found.  PMFS History: Patient Active Problem List   Diagnosis Date Noted  . Presence of left artificial knee joint 07/25/2016  . Pain and swelling of left lower leg 07/25/2016  . Bilateral primary osteoarthritis of knee 07/19/2016    Class: Chronic  . Primary localized osteoarthritis of left knee 07/19/2016  . 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 Hypotestosteronemia 09/08/2015  . Cervical spondylosis without myelopathy 03/25/2014    Class: Chronic  . Left cervical radiculopathy 03/25/2014    Class: Chronic  . Cervical spondylosis with radiculopathy 03/25/2014   Past Medical History:  Diagnosis Date  . Anxiety    mainly r/t pain  .  Arthritis    neck, knees  . Asthma    AS CHILD   . Back pain, chronic   . DDD (degenerative disc disease)   . Dysphagia   . 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 GERD (gastroesophageal reflux disease)   . Hypertension   . Hypotestosteronemia 09/08/2015  . Neck pain, chronic     No family history on file.  Past Surgical History:  Procedure Laterality Date  . ANKLE SURGERY    . BACK SURGERY  2010,2011   lumb fusionx2  . CERVICAL FUSION  4098,1191   3 surgeries  . DIRECT LARYNGOSCOPY N/A 11/09/2012   Procedure: DIRECT LARYNGOSCOPY;  Surgeon: Jodi Marble, MD;  Location: [place];  Service: ENT;  Laterality: N/A;  . ESOPHAGOGASTRODUODENOSCOPY N/A 08/03/2012   Procedure: ESOPHAGOGASTRODUODENOSCOPY (EGD);  Surgeon: Arta Silence, MD;  Location[facility] ENDOSCOPY;  Service: Endoscopy;  Laterality: N/A;  . ESOPHAGOSCOPY W/ BOTOX INJECTION N/A 11/09/2012   Procedure: ESOPHAGOSCOPY POSSIBLE BIOPSY AND POSSIBLE BOTOX INJECTION CRICOPHARYNGEUS;  Surgeon: Jodi Marble, MD;  Location: [place];  Service: ENT;  Laterality: N/A;  Esophagoscopy with botox injection  . HERNIA REPAIR     ing-rt  . KNEE SURGERY     rt and left x2 each=4  . POSTERIOR CERVICAL FUSION/FORAMINOTOMY N/A 03/25/2014   Procedure: LEFT C7-T1 FORAMINOTOMY;  Surgeon: Jessy Oto, MD;  Location:  [place] [facility];  Service: Orthopedics;  Laterality: N/A;  . [place]   rt and lt  . TONSILLECTOMY    . TOTAL KNEE ARTHROPLASTY Left 07/19/2016   Procedure: LEFT TOTAL KNEE ARTHROPLASTY CORTISON INJECTION IN RIGHT KNEE;  Surgeon: Jessy Oto, MD;  Location: [place];  Service: Orthopedics;  Laterality: Left;  MAY NEED RNFA TO FINISH CASE PER [place]   . WRIST SURGERY     lt and rt   Social History   Occupational History  . Not on file.   Social History Main Topics  . Smoking status: Never Smoker  . Smokeless tobacco: Never Used  . Alcohol use No  . Drug use: No  . Sexual  activity: Not on file   Exam Very pleasant white male alert and oriented and in no acute distress. Gait is somewhat antalgic with a single prong cane. Left knee range of motion 0-95. Not really any change in the blisters on the anterior left knee where the Steri-Strips and benzoin were applied. No gross signs of infection. Left ankle is nontender at this point. He does have some midfoot and first MTP joint tenderness on the left.

## 2016-07-29 NOTE — Patient Instructions (Signed)
May wet while bathing. Therapy daily and CPM goal full extension and greater than 90 degrees flexion. Call if fever or chills or increased drainage. Go to ER if acutely short of breath or call for ambulance. Return for follow up in 2 weeks. May full weight bear on the surgical leg unless told otherwise. Use knee immobilizer until able to straight leg raise off bed with knee stable. In house walking for first 2 weeks. INSTRUCTIONS AFTER JOINT REPLACEMENT   o Remove items at home which could result in a fall. This includes throw rugs or furniture in walking pathways o ICE to the affected joint every three hours while awake for 30 minutes at a time, for at least the first 3-5 days, and then as needed for pain and swelling.  Continue to use ice for pain and swelling. You may notice swelling that will progress down to the foot and ankle.  This is normal after surgery.  Elevate your leg when you are not up walking on it.   o Continue to use the breathing machine you got in the hospital (incentive spirometer) which will help keep your temperature down.  It is common for your temperature to cycle up and down following surgery, especially at night when you are not up moving around and exerting yourself.  The breathing machine keeps your lungs expanded and your temperature down.   DIET:  As you were doing prior to hospitalization, we recommend a well-balanced diet.  DRESSING / WOUND CARE / SHOWERING    ACTIVITY  o Increase activity slowly as tolerated, but follow the weight bearing instructions below.   o No driving for 6 weeks or until further direction given by your physician.  You cannot drive while taking narcotics.  o No lifting or carrying greater than 10 lbs. until further directed by your surgeon. o Avoid periods of inactivity such as sitting longer than an hour when not asleep. This helps prevent blood clots.  o You may return to work once you are authorized by your doctor.      WEIGHT BEARING      EXERCISES  Results after joint replacement surgery are often greatly improved when you follow the exercise, range of motion and muscle strengthening exercises prescribed by your doctor. Safety measures are also important to protect the joint from further injury. Any time any of these exercises cause you to have increased pain or swelling, decrease what you are doing until you are comfortable again and then slowly increase them. If you have problems or questions, call your caregiver or physical therapist for advice.   Rehabilitation is important following a joint replacement. After just a few days of immobilization, the muscles of the leg can become weakened and shrink (atrophy).  These exercises are designed to build up the tone and strength of the thigh and leg muscles and to improve motion. Often times heat used for twenty to thirty minutes before working out will loosen up your tissues and help with improving the range of motion but do not use heat for the first two weeks following surgery (sometimes heat can increase post-operative swelling).   These exercises can be done on a training (exercise) mat, on the floor, on a table or on a bed. Use whatever works the best and is most comfortable for you.    Use music or television while you are exercising so that the exercises are a pleasant break in your day. This will make your life better with  the exercises acting as a break in your routine that you can look forward to.   Perform all exercises about fifteen times, three times per day or as directed.  You should exercise both the operative leg and the other leg as well.  Exercises include:   . Quad Sets - Tighten up the muscle on the front of the thigh (Quad) and hold for 5-10 seconds.   . Straight Leg Raises - With your knee straight (if you were given a brace, keep it on), lift the leg to 60 degrees, hold for 3 seconds, and slowly lower the leg.  Perform this exercise  against resistance later as your leg gets stronger.  . Leg Slides: Lying on your back, slowly slide your foot toward your buttocks, bending your knee up off the floor (only go as far as is comfortable). Then slowly slide your foot back down until your leg is flat on the floor again.  . Angel Wings: Lying on your back spread your legs to the side as far apart as you can without causing discomfort.  . Hamstring Strength:  Lying on your back, push your heel against the floor with your leg straight by tightening up the muscles of your buttocks.  Repeat, but this time bend your knee to a comfortable angle, and push your heel against the floor.  You may put a pillow under the heel to make it more comfortable if necessary.   A rehabilitation program following joint replacement surgery can speed recovery and prevent re-injury in the future due to weakened muscles. Contact your doctor or a physical therapist for more information on knee rehabilitation.    CONSTIPATION  Constipation is defined medically as fewer than three stools per week and severe constipation as less than one stool per week.  Even if you have a regular bowel pattern at home, your normal regimen is likely to be disrupted due to multiple reasons following surgery.  Combination of anesthesia, postoperative narcotics, change in appetite and fluid intake all can affect your bowels.   YOU MUST use at least one of the following options; they are listed in order of increasing strength to get the job done.  They are all available over the counter, and you may need to use some, POSSIBLY even all of these options:    Drink plenty of fluids (prune juice may be helpful) and high fiber foods Colace 100 mg by mouth twice a day  Senokot for constipation as directed and as needed Dulcolax (bisacodyl), take with full glass of water  Miralax (polyethylene glycol) once or twice a day as needed.  If you have tried all these things and are unable to have a  bowel movement in the first 3-4 days after surgery call either your surgeon or your primary doctor.    If you experience loose stools or diarrhea, hold the medications until you stool forms back up.  If your symptoms do not get better within 1 week or if they get worse, check with your doctor.  If you experience "the worst abdominal pain ever" or develop nausea or vomiting, please contact the office immediately for further recommendations for treatment.   ITCHING:  If you experience itching with your medications, try taking only a single pain pill, or even half a pain pill at a time.  You can also use Benadryl over the counter for itching or also to help with sleep.   TED HOSE STOCKINGS:  Use stockings on both legs until for   at least 2 weeks or as directed by physician office. They may be removed at night for sleeping.  MEDICATIONS:  See your medication summary on the "After Visit Summary" that nursing will review with you.  You may have some home medications which will be placed on hold until you complete the course of blood thinner medication.  It is important for you to complete the blood thinner medication as prescribed.  PRECAUTIONS:  If you experience chest pain or shortness of breath - call 911 immediately for transfer to the hospital emergency department.   If you develop a fever greater that 101 F, purulent drainage from wound, increased redness or drainage from wound, foul odor from the wound/dressing, or calf pain - CONTACT YOUR SURGEON.                                                   FOLLOW-UP APPOINTMENTS:  If you do not already have a post-op appointment, please call the office for an appointment to be seen by your surgeon.  Guidelines for how soon to be seen are listed in your "After Visit Summary", but are typically between 1-4 weeks after surgery.  OTHER INSTRUCTIONS:   Knee Replacement:  Do not place pillow under knee, focus on keeping the knee straight while resting. CPM  instructions: 0-90 degrees, 2 hours in the morning, 2 hours in the afternoon, and 2 hours in the evening. Place foam block, curve side up under heel at all times except when in CPM or when walking.  DO NOT modify, tear, cut, or change the foam block in any way.  MAKE SURE YOU:  . Understand these instructions.  . Get help right away if you are not doing well or get worse.    Thank you for letting us be a part of your medical care team.  It is a privilege we respect greatly.  We hope these instructions will help you stay on track for a fast and full recovery!

## 2016-07-29 NOTE — Progress Notes (Signed)
Post-Op Visit Note   Patient: Jesus Brown           Date of Birth: September 23, 1965           MRN: [id number] Visit Date: 07/29/2016 PCP: Merrilee Seashore, MD   Assessment & Plan:Post op Day #10, redness and swelling left TKR incision is much improved likely an allergic reaction to the adhesive.  Chief Complaint:  Chief Complaint  Patient presents with  . Left Knee - Routine Post Op   Visit Diagnoses:  1. Status post total left knee replacement   Left knee with decreased erythrema and decreased swelling, skin without any open areas.  Plan: Return in one week for follow up. May wet while bathing. Therapy daily and CPM goal full extension and greater than 90 degrees flexion. Call if fever or chills or increased drainage. Go to ER if acutely short of breath or call for ambulance. Return for follow up in 2 weeks. May full weight bear on the surgical leg unless told otherwise. Use knee immobilizer until able to straight leg raise off bed with knee stable. In house walking for first 2 weeks. INSTRUCTIONS AFTER JOINT REPLACEMENT   o Remove items at home which could result in a fall. This includes throw rugs or furniture in walking pathways o ICE to the affected joint every three hours while awake for 30 minutes at a time, for at least the first 3-5 days, and then as needed for pain and swelling.  Continue to use ice for pain and swelling. You may notice swelling that will progress down to the foot and ankle.  This is normal after surgery.  Elevate your leg when you are not up walking on it.   o Continue to use the breathing machine you got in the hospital (incentive spirometer) which will help keep your temperature down.  It is common for your temperature to cycle up and down following surgery, especially at night when you are not up moving around and exerting yourself.  The breathing machine keeps your lungs expanded and your temperature down.   DIET:  As you were doing prior to  hospitalization, we recommend a well-balanced diet.  DRESSING / WOUND CARE / SHOWERING    ACTIVITY  o Increase activity slowly as tolerated, but follow the weight bearing instructions below.   o No driving for 6 weeks or until further direction given by your physician.  You cannot drive while taking narcotics.  o No lifting or carrying greater than 10 lbs. until further directed by your surgeon. o Avoid periods of inactivity such as sitting longer than an hour when not asleep. This helps prevent blood clots.  o You may return to work once you are authorized by your doctor.     WEIGHT BEARING      EXERCISES  Results after joint replacement surgery are often greatly improved when you follow the exercise, range of motion and muscle strengthening exercises prescribed by your doctor. Safety measures are also important to protect the joint from further injury. Any time any of these exercises cause you to have increased pain or swelling, decrease what you are doing until you are comfortable again and then slowly increase them. If you have problems or questions, call your caregiver or physical therapist for advice.   Rehabilitation is important following a joint replacement. After just a few days of immobilization, the muscles of the leg can become weakened and shrink (atrophy).  These exercises are designed to build up the  tone and strength of the thigh and leg muscles and to improve motion. Often times heat used for twenty to thirty minutes before working out will loosen up your tissues and help with improving the range of motion but do not use heat for the first two weeks following surgery (sometimes heat can increase post-operative swelling).   These exercises can be done on a training (exercise) mat, on the floor, on a table or on a bed. Use whatever works the best and is most comfortable for you.    Use music or television while you are exercising so that the exercises are a pleasant break  in your day. This will make your life better with the exercises acting as a break in your routine that you can look forward to.   Perform all exercises about fifteen times, three times per day or as directed.  You should exercise both the operative leg and the other leg as well.  Exercises include:   . Quad Sets - Tighten up the muscle on the front of the thigh (Quad) and hold for 5-10 seconds.   . Straight Leg Raises - With your knee straight (if you were given a brace, keep it on), lift the leg to 60 degrees, hold for 3 seconds, and slowly lower the leg.  Perform this exercise against resistance later as your leg gets stronger.  . Leg Slides: Lying on your back, slowly slide your foot toward your buttocks, bending your knee up off the floor (only go as far as is comfortable). Then slowly slide your foot back down until your leg is flat on the floor again.  G[place]ard Haring Wings: Lying on your back spread your legs to the side as far apart as you can without causing discomfort.  . Hamstring Strength:  Lying on your back, push your heel against the floor with your leg straight by tightening up the muscles of your buttocks.  Repeat, but this time bend your knee to a comfortable angle, and push your heel against the floor.  You may put a pillow under the heel to make it more comfortable if necessary.   A rehabilitation program following joint replacement surgery can speed recovery and prevent re-injury in the future due to weakened muscles. Contact your doctor or a physical therapist for more information on knee rehabilitation.    CONSTIPATION  Constipation is defined medically as fewer than three stools per week and severe constipation as less than one stool per week.  Even if you have a regular bowel pattern at home, your normal regimen is likely to be disrupted due to multiple reasons following surgery.  Combination of anesthesia, postoperative narcotics, change in appetite and fluid intake all can affect  your bowels.   YOU MUST use at least one of the following options; they are listed in order of increasing strength to get the job done.  They are all available over the counter, and you may need to use some, POSSIBLY even all of these options:    Drink plenty of fluids (prune juice may be helpful) and high fiber foods Colace 100 mg by mouth twice a day  Senokot for constipation as directed and as needed Dulcolax (bisacodyl), take with full glass of water  Miralax (polyethylene glycol) once or twice a day as needed.  If you have tried all these things and are unable to have a bowel movement in the first 3-4 days after surgery call either your surgeon or your primary doctor.  If you experience loose stools or diarrhea, hold the medications until you stool forms back up.  If your symptoms do not get better within 1 week or if they get worse, check with your doctor.  If you experience "the worst abdominal pain ever" or develop nausea or vomiting, please contact the office immediately for further recommendations for treatment.   ITCHING:  If you experience itching with your medications, try taking only a single pain pill, or even half a pain pill at a time.  You can also use Benadryl over the counter for itching or also to help with sleep.   TED HOSE STOCKINGS:  Use stockings on both legs until for at least 2 weeks or as directed by physician office. They may be removed at night for sleeping.  MEDICATIONS:  See your medication summary on the "After Visit Summary" that nursing will review with you.  You may have some home medications which will be placed on hold until you complete the course of blood thinner medication.  It is important for you to complete the blood thinner medication as prescribed.  PRECAUTIONS:  If you experience chest pain or shortness of breath - call 911 immediately for transfer to the hospital emergency department.   If you develop a fever greater that 101 F, purulent  drainage from wound, increased redness or drainage from wound, foul odor from the wound/dressing, or calf pain - CONTACT YOUR SURGEON.                                                   FOLLOW-UP APPOINTMENTS:  If you do not already have a post-op appointment, please call the office for an appointment to be seen by your surgeon.  Guidelines for how soon to be seen are listed in your "After Visit Summary", but are typically between 1-4 weeks after surgery.  OTHER INSTRUCTIONS:   Knee Replacement:  Do not place pillow under knee, focus on keeping the knee straight while resting. CPM instructions: 0-90 degrees, 2 hours in the morning, 2 hours in the afternoon, and 2 hours in the evening. Place foam block, curve side up under heel at all times except when in CPM or when walking.  DO NOT modify, tear, cut, or change the foam block in any way.  MAKE SURE YOU:  . Understand these instructions.  . Get help right away if you are not doing well or get worse.    Thank you for letting us be a part of your medical care team.  It is a privilege we respect greatly.  We hope these instructions will help you stay on track for a fast and full recovery!   Follow-Up Instructions: Return in about 1 week (around 08/05/2016).   Orders:  No orders of the defined types were placed in this encounter.  No orders of the defined types were placed in this encounter.   Imaging: No results found.  PMFS History: Patient Active Problem List   Diagnosis Date Noted  . Bilateral primary osteoarthritis of knee 07/19/2016    Priority: High    Class: Chronic  . Cervical spondylosis without myelopathy 03/25/2014    Priority: High    Class: Chronic  . Left cervical radiculopathy 03/25/2014    Priority: High    Class: Chronic  . Presence of left artificial knee joint 07/25/2016  .  Pain and swelling of left lower leg 07/25/2016  . Primary localized osteoarthritis of left knee 07/19/2016  . Erythrocytosis due to endocrine  disorders 09/08/2015  . Hypotestosteronemia 09/08/2015  . Cervical spondylosis with radiculopathy 03/25/2014   Past Medical History:  Diagnosis Date  . Anxiety    mainly r/t pain  . Arthritis    neck, knees  . Asthma    AS CHILD   . Back pain, chronic   . DDD (degenerative disc disease)   . Dysphagia   . 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 GERD (gastroesophageal reflux disease)   . Hypertension   . Hypotestosteronemia 09/08/2015  . Neck pain, chronic     No family history on file.  Past Surgical History:  Procedure Laterality Date  . ANKLE SURGERY    . BACK SURGERY  2010,2011   lumb fusionx2  . CERVICAL FUSION  7867,6720   3 surgeries  . DIRECT LARYNGOSCOPY N/A 11/09/2012   Procedure: DIRECT LARYNGOSCOPY;  Surgeon: Jodi Marble, MD;  Location: [place];  Service: ENT;  Laterality: N/A;  . ESOPHAGOGASTRODUODENOSCOPY N/A 08/03/2012   Procedure: ESOPHAGOGASTRODUODENOSCOPY (EGD);  Surgeon: Arta Silence, MD;  Location: [facility] ENDOSCOPY;  Service: Endoscopy;  Laterality: N/A;  . ESOPHAGOSCOPY W/ BOTOX INJECTION N/A 11/09/2012   Procedure: ESOPHAGOSCOPY POSSIBLE BIOPSY AND POSSIBLE BOTOX INJECTION CRICOPHARYNGEUS;  Surgeon: Jodi Marble, MD;  Location: [place];  Service: ENT;  Laterality: N/A;  Esophagoscopy with botox injection  . HERNIA REPAIR     ing-rt  . KNEE SURGERY     rt and left x2 each=4  . POSTERIOR CERVICAL FUSION/FORAMINOTOMY N/A 03/25/2014   Procedure: LEFT C7-T1 FORAMINOTOMY;  Surgeon: Jessy Oto, MD;  Location: [place];  Service: Orthopedics;  Laterality: N/A;  . [place]   rt and lt  . TONSILLECTOMY    . TOTAL KNEE ARTHROPLASTY Left 07/19/2016   Procedure: LEFT TOTAL KNEE ARTHROPLASTY CORTISON INJECTION IN RIGHT KNEE;  Surgeon: Jessy Oto, MD;  Location: [place];  Service: Orthopedics;  Laterality: Left;  MAY NEED RNFA TO FINISH CASE PER [place]   . WRIST SURGERY     lt and rt   Social History    Occupational History  . Not on file.   Social History Main Topics  . Smoking status: Never Smoker  . Smokeless tobacco: Never Used  . Alcohol use No  . Drug use: No  . Sexual activity: Not on file

## 2016-07-31 ENCOUNTER — Telehealth (INDEPENDENT_AMBULATORY_CARE_PROVIDER_SITE_OTHER): Payer: Self-pay | Admitting: Specialist

## 2016-07-31 NOTE — Telephone Encounter (Signed)
Tharon Aquas from [facility] called stating that they are discharging the patient and they are referring him to outpatient therapy. CB # [phone]

## 2016-07-31 NOTE — Telephone Encounter (Signed)
Tharon Aquas from [facility] called stating that they are discharging the patient and they are referring him to outpatient therapy.

## 2016-08-01 ENCOUNTER — Inpatient Hospital Stay (INDEPENDENT_AMBULATORY_CARE_PROVIDER_SITE_OTHER): Payer: Medicare Other | Admitting: Specialist

## 2016-08-01 ENCOUNTER — Other Ambulatory Visit (INDEPENDENT_AMBULATORY_CARE_PROVIDER_SITE_OTHER): Payer: Self-pay | Admitting: Specialist

## 2016-08-01 DIAGNOSIS — Z96652 Presence of left artificial knee joint: Secondary | ICD-10-CM

## 2016-08-01 NOTE — Telephone Encounter (Signed)
I called and spoke with this patient, will start out patient therapy at [facility]. PT office. jen

## 2016-08-02 NOTE — Progress Notes (Signed)
Order has been placed for this.  

## 2016-08-02 NOTE — Telephone Encounter (Signed)
Order has been placed for this.  

## 2016-08-06 ENCOUNTER — Encounter: Payer: Self-pay | Admitting: Physical Therapy

## 2016-08-06 ENCOUNTER — Ambulatory Visit: Payer: BLUE CROSS/BLUE SHIELD | Attending: Specialist | Admitting: Physical Therapy

## 2016-08-06 DIAGNOSIS — R262 Difficulty in walking, not elsewhere classified: Secondary | ICD-10-CM | POA: Diagnosis present

## 2016-08-06 DIAGNOSIS — M25562 Pain in left knee: Secondary | ICD-10-CM | POA: Diagnosis present

## 2016-08-06 DIAGNOSIS — R600 Localized edema: Secondary | ICD-10-CM | POA: Diagnosis present

## 2016-08-06 DIAGNOSIS — M25662 Stiffness of left knee, not elsewhere classified: Secondary | ICD-10-CM | POA: Diagnosis present

## 2016-08-06 NOTE — Patient Instructions (Signed)
   Outer Hip Stretch: Reclined IT Band Stretch (Strap)    Strap around opposite foot, pull across only as far as possible with shoulders on mat. Hold for __3__ breaths. Repeat __3__ times each leg.  Copyright  VHI. All rights reserved.  HIP: Hamstrings - Short Sitting    Rest leg on raised surface. Keep knee straight. Lift chest. Hold __30_ seconds. __5_ reps per set, ___3 sets per day, __7_ days per week     Copyright  VHI. All rights reserved.    KNEE: Heel Slide - Supine    Beginning with toes pointed toward ceiling and knees straight, slide involved heel toward buttocks while bending knee. Repeat _5_ times. Do _3_ times per day. Use a sheet to pull and stretch into knee flexion   Copyright  VHI. All rights reserved.

## 2016-08-07 ENCOUNTER — Inpatient Hospital Stay (INDEPENDENT_AMBULATORY_CARE_PROVIDER_SITE_OTHER): Payer: Medicare Other | Admitting: Specialist

## 2016-08-07 ENCOUNTER — Encounter (INDEPENDENT_AMBULATORY_CARE_PROVIDER_SITE_OTHER): Payer: Self-pay

## 2016-08-07 ENCOUNTER — Telehealth (INDEPENDENT_AMBULATORY_CARE_PROVIDER_SITE_OTHER): Payer: Self-pay | Admitting: Specialist

## 2016-08-07 NOTE — Telephone Encounter (Signed)
Patient called advised he was not feeling well and needed to cancel his appointment and reschedule. The number to contact patient is [phone]

## 2016-08-07 NOTE — Therapy (Addendum)
Jesus Brown, [place], [address] Phone: [phone]   Fax:  [phone]  Physical Therapy Evaluation  Patient Details  Name: Jesus Brown MRN: [id number] Date of Birth: 1965-11-13 Referring Provider: Dr. Basil Dess  Encounter Date: 08/06/2016      PT End of Session - 08/06/16 1610    Visit Number 1   Number of Visits 24   Date for PT Re-Evaluation 10/01/16   PT Start Time 1502   PT Stop Time 1550   PT Time Calculation (min) 48 min   Activity Tolerance Patient tolerated treatment well   Behavior During Therapy [facility] for tasks assessed/performed      Past Medical History:  Diagnosis Date  . Anxiety    mainly r/t pain  . Arthritis    neck, knees  . Asthma    AS CHILD   . Back pain, chronic   . DDD (degenerative disc disease)   . Dysphagia   . 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 GERD (gastroesophageal reflux disease)   . Hypertension   . Hypotestosteronemia 09/08/2015  . Neck pain, chronic     Past Surgical History:  Procedure Laterality Date  . ANKLE SURGERY    . BACK SURGERY  2010,2011   lumb fusionx2  . CERVICAL FUSION  4709,6283   3 surgeries  . DIRECT LARYNGOSCOPY N/A 11/09/2012   Procedure: DIRECT LARYNGOSCOPY;  Surgeon: Jodi Marble, MD;  Location: [place];  Service: ENT;  Laterality: N/A;  . ESOPHAGOGASTRODUODENOSCOPY N/A 08/03/2012   Procedure: ESOPHAGOGASTRODUODENOSCOPY (EGD);  Surgeon: Arta Silence, MD;  Location: [facility] ENDOSCOPY;  Service: Endoscopy;  Laterality: N/A;  . ESOPHAGOSCOPY W/ BOTOX INJECTION N/A 11/09/2012   Procedure: ESOPHAGOSCOPY POSSIBLE BIOPSY AND POSSIBLE BOTOX INJECTION CRICOPHARYNGEUS;  Surgeon: Jodi Marble, MD;  Location: [place];  Service: ENT;  Laterality: N/A;  Esophagoscopy with botox injection  . HERNIA REPAIR     ing-rt  . KNEE SURGERY     rt and left x2 each=4  . POSTERIOR CERVICAL FUSION/FORAMINOTOMY  N/A 03/25/2014   Procedure: LEFT C7-T1 FORAMINOTOMY;  Surgeon: Jessy Oto, MD;  Location: [place];  Service: Orthopedics;  Laterality: N/A;  . [place]   rt and lt  . TONSILLECTOMY    . TOTAL KNEE ARTHROPLASTY Left 07/19/2016   Procedure: LEFT TOTAL KNEE ARTHROPLASTY CORTISON INJECTION IN RIGHT KNEE;  Surgeon: Jessy Oto, MD;  Location: [place];  Service: Orthopedics;  Laterality: Left;  MAY NEED RNFA TO FINISH CASE PER [place]   . WRIST SURGERY     lt and rt    There were no vitals filed for this visit.       Subjective Assessment - 08/06/16 1507    Subjective Pt underwent L TKR on 07/19/16.  Had 2 weeks HHPT. He walks with a cane in order to avoid limping and aggravating his back.  He had had multiple surgeries. He did have an allergy to tape during surgery which caused severe swelling, burn. Uses CPM machine 3 times per day which helps him.     Pertinent History multiple knee surgeries, back surgery, neck and shoulder surgeries   Limitations Sitting;Lifting;Standing;Walking;House hold activities;Other (comment)  sleeping   How long can you sit comfortably? has to readjust due to back pain    How long can you stand comfortably? short periods, <10 min    How long can you walk comfortably? 10 min    Patient  Stated Goals Pt wants to walk normally, knee pain has caused alot of back compensation.    Currently in Pain? Yes   Pain Score 4    Pain Location Knee   Pain Orientation Left;Anterior;Lateral   Pain Descriptors / Indicators Aching;Discomfort;Burning;Other (Comment)  deep    Pain Type Surgical pain   Pain Onset 1 to 4 weeks ago   Pain Frequency Constant   Aggravating Factors  staying still    Pain Relieving Factors ice, meds, ROM, CPM ,   Effect of Pain on Daily Activities unable to do normal daily activities, walking with limp causes back pain,unable to work    Multiple Pain Sites Yes   Pain Score 5   Pain Location Back   Pain Orientation Lower   Pain  Descriptors / Indicators Sore   Pain Type Chronic pain   Pain Radiating Towards bilateral outer hips and legs    Pain Onset More than a month ago   Pain Frequency Constant   Aggravating Factors  walking, standing   Pain Relieving Factors bodywork 1 time per week, exercise, stretch , morphine            [facility] PT Assessment - 08/06/16 1511      Assessment   Medical Diagnosis L TKA   Referring Provider Dr. Basil Dess   Onset Date/Surgical Date 07/19/16   Next MD Visit 08/07/16   Prior Therapy Had had for back but not knee      Precautions   Precautions None   Precaution Comments back pain    Required Braces or Orthoses Other Brace/Splint   Other Brace/Splint ace wrap for incision, safety      Restrictions   Weight Bearing Restrictions No     Balance Screen   Has the patient fallen in the past 6 months Yes   How many times? 2   Has the patient had a decrease in activity level because of a fear of falling?  Yes   Is the patient reluctant to leave their home because of a fear of falling?  No     Home Environment   Living Environment Private residence   Living Arrangements Spouse/significant other   Available Help at Discharge Family   Type of [place] to enter   Entrance Stairs-Number of Steps 2   Entrance Stairs-Rails Can reach both   [place] Two level   Alternate Level Stairs-Number of Steps 12   Alternate Level Stairs-Rails Can reach both   [place] - 2 wheels     Prior Function   Level of Independence Independent with basic ADLs;Independent with household mobility without device;Independent with community mobility with device   Vocation On disability   Vocation Requirements retired Hydrographic surveyor, [employer]    Leisure hunting and fishing      Cognition   Overall Cognitive Status Within Functional Limits for tasks assessed     Observation/Other Assessments   Focus on Therapeutic Outcomes (FOTO)  70%     Circumferential Edema    Circumferential - Right 15 3/4 inch    Circumferential - Left  16 3/4 inch      Sensation   Light Touch Appears Intact     Posture/Postural Control   Posture Comments not remarkable other than general stiffness in knees, hips and trunk      AROM   Right Knee Extension 0   Right Knee Flexion 120   Left Knee Extension 14   Left  Knee Flexion 104  with AAROM      PROM   Overall PROM Comments painful, 105 deg      Strength   Right Hip Flexion 3+/5   Left Hip Flexion 3+/5   Right Knee Flexion 4+/5   Right Knee Extension 4+/5   Left Knee Flexion 4-/5   Left Knee Extension 3+/5   Right Ankle Dorsiflexion 5/5   Left Ankle Dorsiflexion 4/5     Palpation   Patella mobility tight, tender    Palpation comment warm, swollen at proximal incision , crusty distal incision     Ambulation/Gait   Ambulation Distance (Feet) 300 Feet   Assistive device Straight cane   Gait Pattern Left flexed knee in stance;Antalgic   Ambulation Surface Level;Indoor   Gait Comments good heel strike, moderate pain no device needed                    [facility] Adult PT Treatment/Exercise - 08/06/16 1511      Knee/Hip Exercises: Stretches   Active Hamstring Stretch Left;2 reps;30 seconds   ITB Stretch Left;2 reps;30 seconds   Gastroc Stretch Both;2 reps;30 seconds     Knee/Hip Exercises: Aerobic   Nustep 6 min level 5 UE and LE      Cryotherapy   Number Minutes Cryotherapy 10 Minutes   Cryotherapy Location Knee   Type of Cryotherapy Ice pack                PT Education - 08/06/16 1610    Education provided Yes   Education Details PT, POC, HEP stretching variations, ice, gait    Person(s) Educated Patient   Methods Explanation;Demonstration;Handout   Comprehension Verbalized understanding;Returned demonstration          PT Short Term Goals - 08/07/16 0952      PT SHORT TERM GOAL #1   Title Pt will be I with initial HEP for ROM and strength in L. knee    Time 3   Period  Weeks   Status New     PT SHORT TERM GOAL #2   Title Pt will be able to go without cane in his home and not aggravate back pain most of the time.    Time 4   Period Weeks   Status New     PT SHORT TERM GOAL #3   Title Pt will achieve no more than 8 deg knee extension for improved gait, flexibility.    Time 4   Period Weeks   Status New           PT Long Term Goals - 08/07/16 0954      PT LONG TERM GOAL #1   Title Pt will understand RICE, posture and body mechanics as it pertains to knee and back pain.    Time 8   Period Weeks   Status New     PT LONG TERM GOAL #2   Title Pt will be I with more advanced HEP for knee, core.    Time 8   Period Weeks   Status New     PT LONG TERM GOAL #3   Title Pt will less than 55% limited on FOTO to demo functional improvement   Time 8   Period Weeks   Status New     PT LONG TERM GOAL #4   Title Pt will be able to go outside and participate in family functions without limitations of knee pain.    Time 8  Period Weeks   Status New     PT LONG TERM GOAL #5   Title Pt will be able to walk for 30 min for general health and fitness without lasting knee pain    Time 8   Period Weeks   Status New      G CODE: Mobility Current CL Goal CK         Plan - 08/06/16 1611    Clinical Impression Statement Pt with mod complexity eval of total knee replacement, evolving due to back pain and interference with mobility.  She lacks ROM, strength and stability.  Edema present, controlled with ice at home and positioning.  He is very conscious of his gait so as not to interfere with his back pain.  He wants to maximize his outcome as his Rt, knee is not hurting him =, needs to have his Rt. knee done in the near future.  He is motivated and will do well in PT.    Rehab Potential Excellent   PT Frequency 3x / week   PT Duration 8 weeks  amy taper to 2 times per week   PT Treatment/Interventions ADLs/Self Care Home  Management;Cryotherapy;Electrical Stimulation;Functional mobility training;Gait training;Stair training;Patient/family education;Passive range of motion;Scar mobilization;Vasopneumatic Device;Neuromuscular re-education;DME Instruction;Balance training;Manual techniques;Taping;Therapeutic exercise;Moist Heat;Therapeutic activities   PT Next Visit Plan check HEP, progress ROM and strength as tol, NuStep, Vaso    PT Home Exercise Plan has HHPT level 1-2 ex, given assisted knee flexion, ITB stretch and hamstring as well today.    Consulted and Agree with Plan of Care Patient      Patient will benefit from skilled therapeutic intervention in order to improve the following deficits and impairments:  Difficulty walking, Increased fascial restricitons, Pain, Hypomobility, Decreased scar mobility, Impaired flexibility, Increased edema, Decreased strength, Decreased mobility  Visit Diagnosis: Acute pain of left knee  Stiffness of left knee, not elsewhere classified  Localized edema  Difficulty in walking, not elsewhere classified     Problem List Patient Active Problem List   Diagnosis Date Noted  . Presence of left artificial knee joint 07/25/2016  . Pain and swelling of left lower leg 07/25/2016  . Bilateral primary osteoarthritis of knee 07/19/2016    Class: Chronic  . Primary localized osteoarthritis of left knee 07/19/2016  . 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 Hypotestosteronemia 09/08/2015  . Cervical spondylosis without myelopathy 03/25/2014    Class: Chronic  . Left cervical radiculopathy 03/25/2014    Class: Chronic  . Cervical spondylosis with radiculopathy 03/25/2014    Les Longmore 08/07/2016, 10:01 AM  [place], [place], [address] Phone: [phone]   Fax:  [phone]  Name: ADRIEN SHANKAR MRN: [id number] Date of Birth: 06/21/1965  Raeford Razor, PT 09/12/16 2:40 PM Phone:  [phone] Fax: [phone]

## 2016-08-08 ENCOUNTER — Ambulatory Visit: Payer: BLUE CROSS/BLUE SHIELD | Admitting: Physical Therapy

## 2016-08-08 DIAGNOSIS — M25562 Pain in left knee: Secondary | ICD-10-CM | POA: Diagnosis not present

## 2016-08-08 DIAGNOSIS — R262 Difficulty in walking, not elsewhere classified: Secondary | ICD-10-CM

## 2016-08-08 DIAGNOSIS — R600 Localized edema: Secondary | ICD-10-CM

## 2016-08-08 DIAGNOSIS — M25662 Stiffness of left knee, not elsewhere classified: Secondary | ICD-10-CM

## 2016-08-08 NOTE — Therapy (Signed)
[place], [place], [address] Phone: [phone]   Fax:  [phone]  Physical Therapy Treatment  Patient Details  Name: Jesus Brown MRN: [id number] Date of Birth: 03-31-1966 Referring Provider: Dr. Basil Dess  Encounter Date: 08/08/2016      PT End of Session - 08/08/16 1157    Visit Number 2   Number of Visits 24   Date for PT Re-Evaluation 10/01/16   PT Start Time 1143   PT Stop Time 1240   PT Time Calculation (min) 57 min   Activity Tolerance Patient tolerated treatment well   Behavior During Therapy [facility] for tasks assessed/performed      Past Medical History:  Diagnosis Date  . Anxiety    mainly r/t pain  . Arthritis    neck, knees  . Asthma    AS CHILD   . Back pain, chronic   . DDD (degenerative disc disease)   . Dysphagia   . 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 GERD (gastroesophageal reflux disease)   . Hypertension   . Hypotestosteronemia 09/08/2015  . Neck pain, chronic     Past Surgical History:  Procedure Laterality Date  . ANKLE SURGERY    . BACK SURGERY  2010,2011   lumb fusionx2  . CERVICAL FUSION  4431,5400   3 surgeries  . DIRECT LARYNGOSCOPY N/A 11/09/2012   Procedure: DIRECT LARYNGOSCOPY;  Surgeon: Jodi Marble, MD;  Location: [place];  Service: ENT;  Laterality: N/A;  . ESOPHAGOGASTRODUODENOSCOPY N/A 08/03/2012   Procedure: ESOPHAGOGASTRODUODENOSCOPY (EGD);  Surgeon: Arta Silence, MD;  [facility] ENDOSCOPY;  Service: Endoscopy;  Laterality: N/A;  . ESOPHAGOSCOPY W/ BOTOX INJECTION N/A 11/09/2012   Procedure: ESOPHAGOSCOPY POSSIBLE BIOPSY AND POSSIBLE BOTOX INJECTION CRICOPHARYNGEUS;  Surgeon: Jodi Marble, MD;  Location: [place];  Service: ENT;  Laterality: N/A;  Esophagoscopy with botox injection  . HERNIA REPAIR     ing-rt  . KNEE SURGERY     rt and left x2 each=4  . POSTERIOR CERVICAL FUSION/FORAMINOTOMY N/A  03/25/2014   Procedure: LEFT C7-T1 FORAMINOTOMY;  Surgeon: Jessy Oto, MD;  Location: [place];  Service: Orthopedics;  Laterality: N/A;  . [place]   rt and lt  . TONSILLECTOMY    . TOTAL KNEE ARTHROPLASTY Left 07/19/2016   Procedure: LEFT TOTAL KNEE ARTHROPLASTY CORTISON INJECTION IN RIGHT KNEE;  Surgeon: Jessy Oto, MD;  Location: [place];  Service: Orthopedics;  Laterality: Left;  MAY NEED RNFA TO FINISH CASE PER [place]   . WRIST SURGERY     lt and rt    There were no vitals filed for this visit.      Subjective Assessment - 08/08/16 1144    Subjective Knee aches and is sore.  I gave my cane the boot.  Wasnt feeling good yesterday, missed MD appt but he had just seen him the week before.     Currently in Pain? Yes   Pain Score 5    Pain Location Knee   Pain Orientation Left;Anterior;Lateral   Pain Descriptors / Indicators Aching   Pain Type Surgical pain   Pain Score 5  8/10 at worst   Pain Location Back   Pain Orientation Left;Lower   Pain Descriptors / Indicators Burning            [facility] PT Assessment - 08/08/16 1206      Strength   Right Hip ABduction 4/5  Left Hip ABduction 4-/5     Flexibility   Soft Tissue Assessment /Muscle Length --  tight ant hip              [facility] Adult PT Treatment/Exercise - 08/08/16 1151      Knee/Hip Exercises: Stretches   Active Hamstring Stretch 3 reps;30 seconds   Hip Flexor Stretch Left;1 rep;Other (comment)   Hip Flexor Stretch Limitations 2 min    Knee: Self-Stretch to increase Flexion 3 reps;30 seconds     Knee/Hip Exercises: Aerobic   Nustep 7 min level 5 UE and LE      Knee/Hip Exercises: Supine   Quad Sets AAROM;Strengthening;Left;1 set;10 reps   Heel Slides AAROM;Strengthening;Left;1 set;10 reps   Straight Leg Raises Strengthening;Left;1 set;10 reps   Straight Leg Raise with External Rotation Strengthening;Left;1 set;10 reps     Knee/Hip Exercises: Sidelying   Hip ABduction  Strengthening;Both;1 set;10 reps     Vasopneumatic   Number Minutes Vasopneumatic  15 minutes   Vasopnuematic Location  Knee   Vasopneumatic Pressure Low   Vasopneumatic Temperature  32 deg      Manual Therapy   Manual therapy comments edema mgmt   Joint Mobilization GR. I-II flexion and extension, gentle    Soft tissue mobilization quads, focus on lateral   Myofascial Release peripatellar, patella mobs    Passive ROM flexion and extension                 PT Education - 08/08/16 1156    Education provided Yes   Education Details swelling, Vaso, using abdominals to maintain a flat back in supine    Person(s) Educated Patient   Methods Explanation   Comprehension Verbalized understanding;Returned demonstration          PT Short Term Goals - 08/07/16 8250      PT SHORT TERM GOAL #1   Title Pt will be I with initial HEP for ROM and strength in L. knee    Time 3   Period Weeks   Status New     PT SHORT TERM GOAL #2   Title Pt will be able to go without cane in his home and not aggravate back pain most of the time.    Time 4   Period Weeks   Status New     PT SHORT TERM GOAL #3   Title Pt will achieve no more than 8 deg knee extension for improved gait, flexibility.    Time 4   Period Weeks   Status New           PT Long Term Goals - 08/07/16 0954      PT LONG TERM GOAL #1   Title Pt will understand RICE, posture and body mechanics as it pertains to knee and back pain.    Time 8   Period Weeks   Status New     PT LONG TERM GOAL #2   Title Pt will be I with more advanced HEP for knee, core.    Time 8   Period Weeks   Status New     PT LONG TERM GOAL #3   Title Pt will less than 55% limited on FOTO to demo functional improvement   Time 8   Period Weeks   Status New     PT LONG TERM GOAL #4   Title Pt will be able to go outside and participate in family functions without limitations of knee pain.    Time 8  Period Weeks   Status New      PT LONG TERM GOAL #5   Title Pt will be able to walk for 30 min for general health and fitness without lasting knee pain    Time 8   Period Weeks   Status New               Plan - 08/08/16 1157    Clinical Impression Statement Pt returns for first visit, tolerated all exercises well. Tightness in ant hip prevents good sidelying glute work. Worked on swelling and manual scar tissue mgmt today.    PT Next Visit Plan advance HEP, progress ROM and strength as tol, NuStep, Vaso    PT Home Exercise Plan has HHPT level 1-2 ex, given assisted knee flexion, ITB stretch and hamstring as well today.    Consulted and Agree with Plan of Care Patient      Patient will benefit from skilled therapeutic intervention in order to improve the following deficits and impairments:  Difficulty walking, Increased fascial restricitons, Pain, Hypomobility, Decreased scar mobility, Impaired flexibility, Increased edema, Decreased strength, Decreased mobility  Visit Diagnosis: Acute pain of left knee  Stiffness of left knee, not elsewhere classified  Localized edema  Difficulty in walking, not elsewhere classified     Problem List Patient Active Problem List   Diagnosis Date Noted  . Presence of left artificial knee joint 07/25/2016  . Pain and swelling of left lower leg 07/25/2016  . Bilateral primary osteoarthritis of knee 07/19/2016    Class: Chronic  . Primary localized osteoarthritis of left knee 07/19/2016  . 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 Hypotestosteronemia 09/08/2015  . Cervical spondylosis without myelopathy 03/25/2014    Class: Chronic  . Left cervical radiculopathy 03/25/2014    Class: Chronic  . Cervical spondylosis with radiculopathy 03/25/2014    Latarra Eagleton 08/08/2016, 12:36 PM  [place], [place], [address] Phone: [phone]   Fax:  [phone]  Name: Jesus Brown MRN: [id number] Date of Birth: 12/14/1965  Raeford Razor, PT 08/08/16 12:36 PM Phone: [phone] Fax: [phone]

## 2016-08-08 NOTE — Telephone Encounter (Signed)
Patient scheduled 08/12/16 @ 3:15 pm

## 2016-08-08 NOTE — Telephone Encounter (Signed)
Can you please call him and schedule for our next available?

## 2016-08-12 ENCOUNTER — Ambulatory Visit (INDEPENDENT_AMBULATORY_CARE_PROVIDER_SITE_OTHER): Payer: Medicare Other | Admitting: Specialist

## 2016-08-12 ENCOUNTER — Encounter (INDEPENDENT_AMBULATORY_CARE_PROVIDER_SITE_OTHER): Payer: Self-pay | Admitting: Specialist

## 2016-08-12 VITALS — BP 125/87 | HR 102 | Ht 68.0 in | Wt 230.0 lb

## 2016-08-12 DIAGNOSIS — Z96652 Presence of left artificial knee joint: Secondary | ICD-10-CM

## 2016-08-12 NOTE — Progress Notes (Addendum)
Post-Op Visit Note   Patient: Jesus Brown           Date of Birth: 02/21/1966           MRN: [id number] Visit Date: 08/12/2016 PCP: Merrilee Seashore, MD   Assessment & Plan: 3 weeks post op left total knee replacement, incision is healing well, adhesive reaction is nearly completely resolved. ROM 10-110 degrees, very good progress.   Chief Complaint:  Chief Complaint  Patient presents with  . Left Knee - Routine Post Op    Left TKA on 07/19/2016   . Right Knee - Follow-up    Injection 07/19/2016  No fever or chills. Outpatient PT at [place].   Incision is healed with minimal eschar upper 1 cm of the incision, mild warmth anteriorly, mild swelling both improved from last visit.   ROM  -10 degrees short of full extension to 110 degrees flexion.  Visit Diagnoses:  1. Status post total knee replacement using cement, left     Plan: Continue with PT on outpt basis at [facility]. Heat or warm before PT helps and Ice afterwards. Full weight bearing helps to straighten the leg. Careful with stairs and steps to avoid patella injuries.  Receiving medications for pain through pain management, [facility]. Follow-Up Instructions: Return in about 4 weeks (around 09/09/2016).   Orders:  No orders of the defined types were placed in this encounter.  No orders of the defined types were placed in this encounter.   Imaging: No results found.  PMFS History: Patient Active Problem List   Diagnosis Date Noted  . Bilateral primary osteoarthritis of knee 07/19/2016    Priority: High    Class: Chronic  . Cervical spondylosis without myelopathy 03/25/2014    Priority: High    Class: Chronic  . Left cervical radiculopathy 03/25/2014    Priority: High    Class: Chronic  . Presence of left artificial knee joint 07/25/2016  . Pain and swelling of left lower leg 07/25/2016  . Primary localized osteoarthritis of left knee 07/19/2016  . Erythrocytosis  due to endocrine disorders 09/08/2015  . Hypotestosteronemia 09/08/2015  . Cervical spondylosis with radiculopathy 03/25/2014   Past Medical History:  Diagnosis Date  . Anxiety    mainly r/t pain  . Arthritis    neck, knees  . Asthma    AS CHILD   . Back pain, chronic   . DDD (degenerative disc disease)   . Dysphagia   . 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 GERD (gastroesophageal reflux disease)   . Hypertension   . Hypotestosteronemia 09/08/2015  . Neck pain, chronic     No family history on file.  Past Surgical History:  Procedure Laterality Date  . ANKLE SURGERY    . BACK SURGERY  2010,2011   lumb fusionx2  . CERVICAL FUSION  9371,6967   3 surgeries  . DIRECT LARYNGOSCOPY N/A 11/09/2012   Procedure: DIRECT LARYNGOSCOPY;  Surgeon: Jodi Marble, MD;  Location: [place];  Service: ENT;  Laterality: N/A;  . ESOPHAGOGASTRODUODENOSCOPY N/A 08/03/2012   Procedure: ESOPHAGOGASTRODUODENOSCOPY (EGD);  Surgeon: Arta Silence, MD;  Location: [facility] ENDOSCOPY;  Service: Endoscopy;  Laterality: N/A;  . ESOPHAGOSCOPY W/ BOTOX INJECTION N/A 11/09/2012   Procedure: ESOPHAGOSCOPY POSSIBLE BIOPSY AND POSSIBLE BOTOX INJECTION CRICOPHARYNGEUS;  Surgeon: Jodi Marble, MD;  Location: [place];  Service: ENT;  Laterality: N/A;  Esophagoscopy with botox injection  . HERNIA REPAIR  ing-rt  . KNEE SURGERY     rt and left x2 each=4  . POSTERIOR CERVICAL FUSION/FORAMINOTOMY N/A 03/25/2014   Procedure: LEFT C7-T1 FORAMINOTOMY;  Surgeon: Jessy Oto, MD;  Location: [place];  Service: Orthopedics;  Laterality: N/A;  . [place]   rt and lt  . TONSILLECTOMY    . TOTAL KNEE ARTHROPLASTY Left 07/19/2016   Procedure: LEFT TOTAL KNEE ARTHROPLASTY CORTISON INJECTION IN RIGHT KNEE;  Surgeon: Jessy Oto, MD;  Location: [place];  Service: Orthopedics;  Laterality: Left;  MAY NEED RNFA TO FINISH CASE PER [place]   . WRIST SURGERY     lt and rt    Social History   Occupational History  . Not on file.   Social History Main Topics  . Smoking status: Never Smoker  . Smokeless tobacco: Never Used  . Alcohol use No  . Drug use: No  . Sexual activity: Not on file

## 2016-08-12 NOTE — Patient Instructions (Signed)
Plan: Continue with PT on outpt basis at [facility]. Heat or warm before PT helps and Ice afterwards. Full weight bearing helps to straighten the leg. Careful with stairs and steps to avoid patella injuries.

## 2016-08-13 ENCOUNTER — Ambulatory Visit: Payer: BLUE CROSS/BLUE SHIELD | Admitting: Physical Therapy

## 2016-08-13 ENCOUNTER — Encounter: Payer: Self-pay | Admitting: Physical Therapy

## 2016-08-13 DIAGNOSIS — R262 Difficulty in walking, not elsewhere classified: Secondary | ICD-10-CM

## 2016-08-13 DIAGNOSIS — M25562 Pain in left knee: Secondary | ICD-10-CM

## 2016-08-13 DIAGNOSIS — M25662 Stiffness of left knee, not elsewhere classified: Secondary | ICD-10-CM

## 2016-08-13 DIAGNOSIS — R600 Localized edema: Secondary | ICD-10-CM

## 2016-08-13 NOTE — Therapy (Signed)
[place], [place], [address] Phone: [phone]   Fax:  [phone]  Physical Therapy Treatment  Patient Details  Name: Jesus Brown MRN: [id number] Date of Birth: [birth year] Referring Provider: Dr. Basil Dess  Encounter Date: 08/13/2016      PT End of Session - 08/13/16 1249    Visit Number 3   Number of Visits 24   Date for PT Re-Evaluation 10/01/16   PT Start Time 1147   PT Stop Time 1240   PT Time Calculation (min) 53 min   Activity Tolerance Patient tolerated treatment well   Behavior During Therapy [facility] for tasks assessed/performed      Past Medical History:  Diagnosis Date  . Anxiety    mainly r/t pain  . Arthritis    neck, knees  . Asthma    AS CHILD   . Back pain, chronic   . DDD (degenerative disc disease)   . Dysphagia   . 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 GERD (gastroesophageal reflux disease)   . Hypertension   . Hypotestosteronemia 09/08/2015  . Neck pain, chronic     Past Surgical History:  Procedure Laterality Date  . ANKLE SURGERY    . BACK SURGERY  2010,2011   lumb fusionx2  . CERVICAL FUSION  2778,2423   3 surgeries  . DIRECT LARYNGOSCOPY N/A 11/09/2012   Procedure: DIRECT LARYNGOSCOPY;  Surgeon: Jodi Marble, MD;  Location: [place];  Service: ENT;  Laterality: N/A;  . ESOPHAGOGASTRODUODENOSCOPY N/A 08/03/2012   Procedure: ESOPHAGOGASTRODUODENOSCOPY (EGD);  Surgeon: Arta Silence, MD;  Location: [facility] ENDOSCOPY;  Service: Endoscopy;  Laterality: N/A;  . ESOPHAGOSCOPY W/ BOTOX INJECTION N/A 11/09/2012   Procedure: ESOPHAGOSCOPY POSSIBLE BIOPSY AND POSSIBLE BOTOX INJECTION CRICOPHARYNGEUS;  Surgeon: Jodi Marble, MD;  Location: [place];  Service: ENT;  Laterality: N/A;  Esophagoscopy with botox injection  . HERNIA REPAIR     ing-rt  . KNEE SURGERY     rt and left x2 each=4  . POSTERIOR CERVICAL FUSION/FORAMINOTOMY N/A  03/25/2014   Procedure: LEFT C7-T1 FORAMINOTOMY;  Surgeon: Jessy Oto, MD;  Location: [place];  Service: Orthopedics;  Laterality: N/A;  . [place]   rt and lt  . TONSILLECTOMY    . TOTAL KNEE ARTHROPLASTY Left 07/19/2016   Procedure: LEFT TOTAL KNEE ARTHROPLASTY CORTISON INJECTION IN RIGHT KNEE;  Surgeon: Jessy Oto, MD;  Location: [place];  Service: Orthopedics;  Laterality: Left;  MAY NEED RNFA TO FINISH CASE PER [place]   . WRIST SURGERY     lt and rt    There were no vitals filed for this visit.      Subjective Assessment - 08/13/16 1152    Subjective (P)  I want to walk better.     Pertinent History (P)  multiple knee surgeries, back surgery, neck and shoulder surgeries   Currently in Pain? (P)  Yes   Pain Score (P)  4    Pain Location (P)  Knee   Pain Orientation (P)  Left;Anterior;Lateral   Pain Descriptors / Indicators (P)  Aching   Pain Type (P)  Surgical pain   Pain Frequency (P)  Constant   Aggravating Factors  (P)  sleeping in bed   Pain Relieving Factors (P)  moving,   CPM,  Ice,  stretching.  Hot water   Effect of Pain on Daily Activities (P)  Not sleeping in bed,  Walking with  limp.            [facility] PT Assessment - 08/13/16 0001      AROM   Left Knee Flexion 112                     [facility] Adult PT Treatment/Exercise - 08/13/16 0001      Knee/Hip Exercises: Stretches   Passive Hamstring Stretch 3 reps;30 seconds   Passive Hamstring Stretch Limitations Also demo how to stretch hamstrings off and on during the day     Vasopneumatic   Number Minutes Vasopneumatic  15 minutes   Vasopnuematic Location  Knee   Vasopneumatic Pressure Low   Vasopneumatic Temperature  34     Manual Therapy   Manual therapy comments edema management,  patellar mobe, scar tissue mobs,  self mobs with towel roll  edema much improved   Joint Mobilization GR II PA mobe with distraction   Soft tissue mobilization quads   Myofascial Release  peripatellar, patella mobs    Passive ROM flexion and extension                 PT Education - 08/13/16 1249    Education provided Yes   Education Details How to stretch, how to reduce swelling   Person(s) Educated Patient   Methods Explanation;Demonstration;Verbal cues   Comprehension Verbalized understanding          PT Short Term Goals - 08/13/16 1253      PT SHORT TERM GOAL #1   Title Pt will be I with initial HEP for ROM and strength in L. knee    Baseline independent with exercises issued so far,  Exercises total of about 1 hour a day at home.    Time 3   Period Weeks   Status On-going     PT SHORT TERM GOAL #2   Title Pt will be able to go without cane in his home and not aggravate back pain most of the time.    Time 4   Period Weeks   Status Unable to assess     PT SHORT TERM GOAL #3   Title Pt will achieve no more than 8 deg knee extension for improved gait, flexibility.    Baseline visually more than 8 degrees   Time 4   Period Weeks   Status On-going           PT Long Term Goals - 08/07/16 0954      PT LONG TERM GOAL #1   Title Pt will understand RICE, posture and body mechanics as it pertains to knee and back pain.    Time 8   Period Weeks   Status New     PT LONG TERM GOAL #2   Title Pt will be I with more advanced HEP for knee, core.    Time 8   Period Weeks   Status New     PT LONG TERM GOAL #3   Title Pt will less than 55% limited on FOTO to demo functional improvement   Time 8   Period Weeks   Status New     PT LONG TERM GOAL #4   Title Pt will be able to go outside and participate in family functions without limitations of knee pain.    Time 8   Period Weeks   Status New     PT LONG TERM GOAL #5   Title Pt will be able to walk for 30 min  for general health and fitness without lasting knee pain    Time 8   Period Weeks   Status New               Plan - 08/13/16 1250    Clinical Impression Statement 112 AROM  post sesssion. Education continues for gait and exercises.  Edema calf decreased post session.  Progress toward HEP goals.  Pain 3/10 post session prior to vasopneumatic.    PT Next Visit Plan advance HEP, progress ROM and strength as tol, NuStep, Vaso Consider wall sides with weightbearing.     PT Home Exercise Plan has HHPT level 1-2 ex, given assisted knee flexion, ITB stretch and hamstring as well today.    Consulted and Agree with Plan of Care Patient      Patient will benefit from skilled therapeutic intervention in order to improve the following deficits and impairments:  Difficulty walking, Increased fascial restricitons, Pain, Hypomobility, Decreased scar mobility, Impaired flexibility, Increased edema, Decreased strength, Decreased mobility  Visit Diagnosis: Acute pain of left knee  Stiffness of left knee, not elsewhere classified  Localized edema  Difficulty in walking, not elsewhere classified     Problem List Patient Active Problem List   Diagnosis Date Noted  . Presence of left artificial knee joint 07/25/2016  . Pain and swelling of left lower leg 07/25/2016  . Bilateral primary osteoarthritis of knee 07/19/2016    Class: Chronic  . Primary localized osteoarthritis of left knee 07/19/2016  . 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 Hypotestosteronemia 09/08/2015  . Cervical spondylosis without myelopathy 03/25/2014    Class: Chronic  . Left cervical radiculopathy 03/25/2014    Class: Chronic  . Cervical spondylosis with radiculopathy 03/25/2014    Latonga Ponder PTA 08/13/2016, 1:01 PM  [facility] [address] [place], [place], [address] Phone: [phone]   Fax:  [phone]  Name: Jesus Brown MRN: [id number] Date of Birth: 1965/07/15

## 2016-08-14 ENCOUNTER — Encounter: Payer: Self-pay | Admitting: Physical Therapy

## 2016-08-14 ENCOUNTER — Ambulatory Visit: Payer: BLUE CROSS/BLUE SHIELD | Admitting: Physical Therapy

## 2016-08-14 DIAGNOSIS — M25562 Pain in left knee: Secondary | ICD-10-CM

## 2016-08-14 DIAGNOSIS — R262 Difficulty in walking, not elsewhere classified: Secondary | ICD-10-CM

## 2016-08-14 DIAGNOSIS — R600 Localized edema: Secondary | ICD-10-CM

## 2016-08-14 DIAGNOSIS — M25662 Stiffness of left knee, not elsewhere classified: Secondary | ICD-10-CM

## 2016-08-14 NOTE — Therapy (Signed)
[place], [place], [address] Phone: [phone]   Fax:  [phone]  Physical Therapy Treatment  Patient Details  Name: Jesus Brown MRN: [id number] Date of Birth: 22-May-1965 Referring Provider: Dr. Basil Dess  Encounter Date: 08/14/2016      PT End of Session - 08/14/16 1316    Visit Number 4   Number of Visits 24   Date for PT Re-Evaluation 10/01/16   PT Start Time 0848   PT Stop Time 0930   PT Time Calculation (min) 42 min   Activity Tolerance Patient tolerated treatment well   Behavior During Therapy [facility] for tasks assessed/performed      Past Medical History:  Diagnosis Date  . Anxiety    mainly r/t pain  . Arthritis    neck, knees  . Asthma    AS CHILD   . Back pain, chronic   . DDD (degenerative disc disease)   . Dysphagia   . 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 GERD (gastroesophageal reflux disease)   . Hypertension   . Hypotestosteronemia 09/08/2015  . Neck pain, chronic     Past Surgical History:  Procedure Laterality Date  . ANKLE SURGERY    . BACK SURGERY  2010,2011   lumb fusionx2  . CERVICAL FUSION  6160,7371   3 surgeries  . DIRECT LARYNGOSCOPY N/A 11/09/2012   Procedure: DIRECT LARYNGOSCOPY;  Surgeon: Jodi Marble, MD;  Location: [place];  Service: ENT;  Laterality: N/A;  . ESOPHAGOGASTRODUODENOSCOPY N/A 08/03/2012   Procedure: ESOPHAGOGASTRODUODENOSCOPY (EGD);  Surgeon: Arta Silence, MD;  Location: [facility] ENDOSCOPY;  Service: Endoscopy;  Laterality: N/A;  . ESOPHAGOSCOPY W/ BOTOX INJECTION N/A 11/09/2012   Procedure: ESOPHAGOSCOPY POSSIBLE BIOPSY AND POSSIBLE BOTOX INJECTION CRICOPHARYNGEUS;  Surgeon: Jodi Marble, MD;  Location: [place];  Service: ENT;  Laterality: N/A;  Esophagoscopy with botox injection  . HERNIA REPAIR     ing-rt  . KNEE SURGERY     rt and left x2 each=4  . POSTERIOR CERVICAL FUSION/FORAMINOTOMY N/A  03/25/2014   Procedure: LEFT C7-T1 FORAMINOTOMY;  Surgeon: Jessy Oto, MD;  Location: [place];  Service: Orthopedics;  Laterality: N/A;  . [place]   rt and lt  . TONSILLECTOMY    . TOTAL KNEE ARTHROPLASTY Left 07/19/2016   Procedure: LEFT TOTAL KNEE ARTHROPLASTY CORTISON INJECTION IN RIGHT KNEE;  Surgeon: Jessy Oto, MD;  Location: [place];  Service: Orthopedics;  Laterality: Left;  MAY NEED RNFA TO FINISH CASE PER [place]   . WRIST SURGERY     lt and rt    There were no vitals filed for this visit.      Subjective Assessment - 08/14/16 0848    Subjective Sore today.  they took the CPM machine and It usually helps in the morning.   Currently in Pain? Yes   Pain Score 5    Pain Location Knee   Pain Orientation Left;Anterior;Lateral   Pain Descriptors / Indicators Aching;Shooting;Sore;Spasm;Cramping   Pain Type Surgical pain   Pain Frequency Intermittent   Aggravating Factors  staying still.   Pain Relieving Factors ice meds,  ROM   Effect of Pain on Daily Activities Needs cane a little. not back to ADL's   Pain Score 4  Neck is 5/10 constant chronic   Pain Location Back   Pain Orientation Left;Lower   Pain Descriptors / Indicators Burning  pain   Pain Type Chronic pain  Pain Radiating Towards hips, legs   Pain Frequency Constant   Aggravating Factors  walking,  standing   Pain Relieving Factors Meds,                           [facility] Adult PT Treatment/Exercise - 08/14/16 0001      Knee/Hip Exercises: Stretches   Sports administrator Limitations 10 X 10 seconds,  prone with strap and standing foot on step     Knee/Hip Exercises: Aerobic   Nustep 7 min level 5 UE and LE      Knee/Hip Exercises: Seated   Long Arc Quad 1 set;10 reps   Long Arc Quad Weight 3 lbs.     Knee/Hip Exercises: Supine   Heel Slides AAROM;10 reps     Knee/Hip Exercises: Prone   Hamstring Curl 5 reps     Manual Therapy   Manual therapy comments scar tissue mobs    Joint Mobilization GR II PA mobe with distraction                PT Education - 08/13/16 1249    Education provided Yes   Education Details How to stretch, how to reduce swelling   Person(s) Educated Patient   Methods Explanation;Demonstration;Verbal cues   Comprehension Verbalized understanding          PT Short Term Goals - 08/13/16 1253      PT SHORT TERM GOAL #1   Title Pt will be I with initial HEP for ROM and strength in L. knee    Baseline independent with exercises issued so far,  Exercises total of about 1 hour a day at home.    Time 3   Period Weeks   Status On-going     PT SHORT TERM GOAL #2   Title Pt will be able to go without cane in his home and not aggravate back pain most of the time.    Time 4   Period Weeks   Status Unable to assess     PT SHORT TERM GOAL #3   Title Pt will achieve no more than 8 deg knee extension for improved gait, flexibility.    Baseline visually more than 8 degrees   Time 4   Period Weeks   Status On-going           PT Long Term Goals - 08/07/16 0954      PT LONG TERM GOAL #1   Title Pt will understand RICE, posture and body mechanics as it pertains to knee and back pain.    Time 8   Period Weeks   Status New     PT LONG TERM GOAL #2   Title Pt will be I with more advanced HEP for knee, core.    Time 8   Period Weeks   Status New     PT LONG TERM GOAL #3   Title Pt will less than 55% limited on FOTO to demo functional improvement   Time 8   Period Weeks   Status New     PT LONG TERM GOAL #4   Title Pt will be able to go outside and participate in family functions without limitations of knee pain.    Time 8   Period Weeks   Status New     PT LONG TERM GOAL #5   Title Pt will be able to walk for 30 min for general health and fitness without lasting knee  pain    Time 8   Period Weeks   Status New               Plan - 08/14/16 1316    Clinical Impression Statement PATIENT is sore today  from yesterday's session.  Pain decreased with exercises.  No new goals or objective findings noted.  Patient declined step up 4 inches because MD said not to do for a few more weeks.  (MD  mentioned taking care to avoid patellar injury)he did not like Vaso ( causing pain that lasted quite awhile).   PT Next Visit Plan advance HEP, progress ROM and strength as tol, NuStep,  wall sides with weightbearing.     PT Home Exercise Plan has HHPT level 1-2 ex, given assisted knee flexion, ITB stretch and hamstring as well today.    Consulted and Agree with Plan of Care Patient      Patient will benefit from skilled therapeutic intervention in order to improve the following deficits and impairments:  Difficulty walking, Increased fascial restricitons, Pain, Hypomobility, Decreased scar mobility, Impaired flexibility, Increased edema, Decreased strength, Decreased mobility  Visit Diagnosis: Acute pain of left knee  Stiffness of left knee, not elsewhere classified  Localized edema  Difficulty in walking, not elsewhere classified     Problem List Patient Active Problem List   Diagnosis Date Noted  . Presence of left artificial knee joint 07/25/2016  . Pain and swelling of left lower leg 07/25/2016  . Bilateral primary osteoarthritis of knee 07/19/2016    Class: Chronic  . Primary localized osteoarthritis of left knee 07/19/2016  . 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 Hypotestosteronemia 09/08/2015  . Cervical spondylosis without myelopathy 03/25/2014    Class: Chronic  . Left cervical radiculopathy 03/25/2014    Class: Chronic  . Cervical spondylosis with radiculopathy 03/25/2014    Noemie Devivo PTA 08/14/2016, 1:24 PM  [facility] [address] [place], [place], [address] Phone: [phone]   Fax:  [phone]  Name: OLUWAFEMI VILLELLA MRN: [id number] Date of Birth: [birth year]

## 2016-08-19 ENCOUNTER — Ambulatory Visit: Payer: BLUE CROSS/BLUE SHIELD | Admitting: Physical Therapy

## 2016-08-19 ENCOUNTER — Encounter: Payer: Self-pay | Admitting: Physical Therapy

## 2016-08-19 DIAGNOSIS — R600 Localized edema: Secondary | ICD-10-CM

## 2016-08-19 DIAGNOSIS — M25662 Stiffness of left knee, not elsewhere classified: Secondary | ICD-10-CM

## 2016-08-19 DIAGNOSIS — M25562 Pain in left knee: Secondary | ICD-10-CM | POA: Diagnosis not present

## 2016-08-19 DIAGNOSIS — R262 Difficulty in walking, not elsewhere classified: Secondary | ICD-10-CM

## 2016-08-19 NOTE — Therapy (Signed)
[place], [place], [address] Phone: [phone]   Fax:  [phone]  Physical Therapy Treatment  Patient Details  Name: Jesus Brown MRN: [id number] Date of Birth: January 09, 1966 Referring Provider: Dr. Basil Dess  Encounter Date: 08/19/2016      PT End of Session - 08/19/16 1815    Visit Number 5   Number of Visits 24   Date for PT Re-Evaluation 10/01/16   PT Start Time 1151   PT Stop Time 1240   PT Time Calculation (min) 49 min   Activity Tolerance Patient tolerated treatment well   Behavior During Therapy [facility] for tasks assessed/performed      Past Medical History:  Diagnosis Date  . Anxiety    mainly r/t pain  . Arthritis    neck, knees  . Asthma    AS CHILD   . Back pain, chronic   . DDD (degenerative disc disease)   . Dysphagia   . 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 GERD (gastroesophageal reflux disease)   . Hypertension   . Hypotestosteronemia 09/08/2015  . Neck pain, chronic     Past Surgical History:  Procedure Laterality Date  . ANKLE SURGERY    . BACK SURGERY  2010,2011   lumb fusionx2  . CERVICAL FUSION  0160,1093   3 surgeries  . DIRECT LARYNGOSCOPY N/A 11/09/2012   Procedure: DIRECT LARYNGOSCOPY;  Surgeon: Jodi Marble, MD;  Location: [place];  Service: ENT;  Laterality: N/A;  . ESOPHAGOGASTRODUODENOSCOPY N/A 08/03/2012   Procedure: ESOPHAGOGASTRODUODENOSCOPY (EGD);  Surgeon: Arta Silence, MD;  Location: [facility] ENDOSCOPY;  Service: Endoscopy;  Laterality: N/A;  . ESOPHAGOSCOPY W/ BOTOX INJECTION N/A 11/09/2012   Procedure: ESOPHAGOSCOPY POSSIBLE BIOPSY AND POSSIBLE BOTOX INJECTION CRICOPHARYNGEUS;  Surgeon: Jodi Marble, MD;  Location: [place];  Service: ENT;  Laterality: N/A;  Esophagoscopy with botox injection  . HERNIA REPAIR     ing-rt  . KNEE SURGERY     rt and left x2 each=4  . POSTERIOR CERVICAL FUSION/FORAMINOTOMY  N/A 03/25/2014   Procedure: LEFT C7-T1 FORAMINOTOMY;  Surgeon: Jessy Oto, MD;  Location: [place];  Service: Orthopedics;  Laterality: N/A;  . [place]   rt and lt  . TONSILLECTOMY    . TOTAL KNEE ARTHROPLASTY Left 07/19/2016   Procedure: LEFT TOTAL KNEE ARTHROPLASTY CORTISON INJECTION IN RIGHT KNEE;  Surgeon: Jessy Oto, MD;  Location: [place];  Service: Orthopedics;  Laterality: Left;  MAY NEED RNFA TO FINISH CASE PER [place]   . WRIST SURGERY     lt and rt    There were no vitals filed for this visit.      Subjective Assessment - 08/19/16 1158    Subjective Patient has figured out how to loosen his knee with exercises.  Swelling is about the same   Currently in Pain? Yes   Pain Score 4    Pain Location Knee   Pain Orientation Left;Anterior;Lateral   Pain Descriptors / Indicators Shooting;Aching;Sore;Cramping  stiff   Pain Type Surgical pain   Pain Radiating Towards ankle   Pain Frequency Intermittent   Aggravating Factors  not moving,  walking too long   Pain Relieving Factors Ice,  Moving   Effect of Pain on Daily Activities Limits sleeping And ADL's   Pain Score 5   Pain Location Back   Pain Orientation Left;Lower   Pain Descriptors / Indicators Burning   Pain Radiating Towards  hips and legs   Pain Frequency Constant   Aggravating Factors  on back too long.  standing,  walking   Pain Relieving Factors meds,  change of position   Effect of Pain on Daily Activities No able to work                         [facility] Adult PT Treatment/Exercise - 08/19/16 0001      Knee/Hip Exercises: Aerobic   Recumbent Bike 5 minutes 1.2 miles , L!     Knee/Hip Exercises: Standing   Other Standing Knee Exercises wall slides single and double 10 X each with cues  4-5/10     Right /  LT back     Knee/Hip Exercises: Seated   Long Arc Quad 1 set;10 reps   Hamstring Curl 2 sets;10 reps   Hamstring Limitations yellow band     Knee/Hip Exercises: Supine    Quad Sets 10 reps   Short Arc Quad Sets 2 sets;10 reps   Short Arc Quad Sets Limitations could not tolerate 3 LBS at knee   Straight Leg Raises 5 reps;AAROM   Straight Leg Raises Limitations Back pain limits  Neural tension noted with foot position.   Other Supine Knee/Hip Exercises ankle pumps ( Nerve flossing)  5 X      Knee/Hip Exercises: Sidelying   Clams 10 X 2 sets     Manual Therapy   Soft tissue mobilization scar tissue work and soft tissue work  Left knee tissue softened,distal quads stiff  initiallt  patellar mobilization improving.                    PT Short Term Goals - 08/19/16 1819      PT SHORT TERM GOAL #1   Baseline independent with exercises issued so far,    Time 3   Period Weeks   Status On-going     PT SHORT TERM GOAL #2   Title Pt will be able to go without cane in his home and not aggravate back pain most of the time.    Baseline cane intermittant,  does not use all the time.   Time 4   Period Weeks   Status On-going     PT SHORT TERM GOAL #3   Title Pt will achieve no more than 8 deg knee extension for improved gait, flexibility.    Baseline visually more than 8 degrees   Time 4   Period Weeks   Status On-going           PT Long Term Goals - 08/07/16 0954      PT LONG TERM GOAL #1   Title Pt will understand RICE, posture and body mechanics as it pertains to knee and back pain.    Time 8   Period Weeks   Status New     PT LONG TERM GOAL #2   Title Pt will be I with more advanced HEP for knee, core.    Time 8   Period Weeks   Status New     PT LONG TERM GOAL #3   Title Pt will less than 55% limited on FOTO to demo functional improvement   Time 8   Period Weeks   Status New     PT LONG TERM GOAL #4   Title Pt will be able to go outside and participate in family functions without limitations of knee pain.    Time  8   Period Weeks   Status New     PT LONG TERM GOAL #5   Title Pt will be able to walk for 30 min for  general health and fitness without lasting knee pain    Time 8   Period Weeks   Status New               Plan - 08/19/16 1816    Clinical Impression Statement Care taken to avoid increasing back pain with exercises today.  Strengthening with 3 LBS for SAQ on pink foam roller not tolerated peri patella. ROM gradually improving .    PT Next Visit Plan advance HEP  Mini squats?,  wall slides?, progress ROM and strength as tol, NuStep,     PT Home Exercise Plan has HHPT level 1-2 ex, given assisted knee flexion, ITB stretch and hamstring as well today.    Consulted and Agree with Plan of Care Patient      Patient will benefit from skilled therapeutic intervention in order to improve the following deficits and impairments:  Difficulty walking, Increased fascial restricitons, Pain, Hypomobility, Decreased scar mobility, Impaired flexibility, Increased edema, Decreased strength, Decreased mobility  Visit Diagnosis: Acute pain of left knee  Stiffness of left knee, not elsewhere classified  Localized edema  Difficulty in walking, not elsewhere classified     Problem List Patient Active Problem List   Diagnosis Date Noted  . Presence of left artificial knee joint 07/25/2016  . Pain and swelling of left lower leg 07/25/2016  . Bilateral primary osteoarthritis of knee 07/19/2016    Class: Chronic  . Primary localized osteoarthritis of left knee 07/19/2016  . 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 Hypotestosteronemia 09/08/2015  . Cervical spondylosis without myelopathy 03/25/2014    Class: Chronic  . Left cervical radiculopathy 03/25/2014    Class: Chronic  . Cervical spondylosis with radiculopathy 03/25/2014    HARRIS,KAREN PTA 08/19/2016, 6:21 PM  [place], [place], [address] Phone: [phone]   Fax:  [phone]  Name: Jesus Brown MRN: [id number] Date of Birth:  [birth year]

## 2016-08-21 ENCOUNTER — Ambulatory Visit: Payer: BLUE CROSS/BLUE SHIELD | Admitting: Physical Therapy

## 2016-08-21 DIAGNOSIS — M25562 Pain in left knee: Secondary | ICD-10-CM | POA: Diagnosis not present

## 2016-08-21 DIAGNOSIS — R600 Localized edema: Secondary | ICD-10-CM

## 2016-08-21 DIAGNOSIS — M25662 Stiffness of left knee, not elsewhere classified: Secondary | ICD-10-CM

## 2016-08-21 DIAGNOSIS — R262 Difficulty in walking, not elsewhere classified: Secondary | ICD-10-CM

## 2016-08-21 NOTE — Therapy (Signed)
[place], [place], [address] Phone: [phone]   Fax:  [phone]  Physical Therapy Treatment  Patient Details  Name: Jesus Brown MRN: [id number] Date of Birth: 1965-10-15 Referring Provider: Dr. Basil Dess  Encounter Date: 08/21/2016      PT End of Session - 08/21/16 1409    Visit Number 6   Number of Visits 24   Date for PT Re-Evaluation 10/01/16   PT Start Time 1012   PT Stop Time 1100   PT Time Calculation (min) 48 min   Activity Tolerance Patient tolerated treatment well   Behavior During Therapy [facility] for tasks assessed/performed      Past Medical History:  Diagnosis Date  . Anxiety    mainly r/t pain  . Arthritis    neck, knees  . Asthma    AS CHILD   . Back pain, chronic   . DDD (degenerative disc disease)   . Dysphagia   . 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 GERD (gastroesophageal reflux disease)   . Hypertension   . Hypotestosteronemia 09/08/2015  . Neck pain, chronic     Past Surgical History:  Procedure Laterality Date  . ANKLE SURGERY    . BACK SURGERY  2010,2011   lumb fusionx2  . CERVICAL FUSION  1829,9371   3 surgeries  . DIRECT LARYNGOSCOPY N/A 11/09/2012   Procedure: DIRECT LARYNGOSCOPY;  Surgeon: Jodi Marble, MD;  Location: [place];  Service: ENT;  Laterality: N/A;  . ESOPHAGOGASTRODUODENOSCOPY N/A 08/03/2012   Procedure: ESOPHAGOGASTRODUODENOSCOPY (EGD);  Surgeon: Arta Silence, MD;  Location: [facility] ENDOSCOPY;  Service: Endoscopy;  Laterality: N/A;  . ESOPHAGOSCOPY W/ BOTOX INJECTION N/A 11/09/2012   Procedure: ESOPHAGOSCOPY POSSIBLE BIOPSY AND POSSIBLE BOTOX INJECTION CRICOPHARYNGEUS;  Surgeon: Jodi Marble, MD;  Location: [place];  Service: ENT;  Laterality: N/A;  Esophagoscopy with botox injection  . HERNIA REPAIR     ing-rt  . KNEE SURGERY     rt and left x2 each=4  . POSTERIOR CERVICAL FUSION/FORAMINOTOMY  N/A 03/25/2014   Procedure: LEFT C7-T1 FORAMINOTOMY;  Surgeon: Jessy Oto, MD;  Location: [place];  Service: Orthopedics;  Laterality: N/A;  . [place]   rt and lt  . TONSILLECTOMY    . TOTAL KNEE ARTHROPLASTY Left 07/19/2016   Procedure: LEFT TOTAL KNEE ARTHROPLASTY CORTISON INJECTION IN RIGHT KNEE;  Surgeon: Jessy Oto, MD;  Location: [place];  Service: Orthopedics;  Laterality: Left;  MAY NEED RNFA TO FINISH CASE PER [facility]   . WRIST SURGERY     lt and rt    There were no vitals filed for this visit.      Subjective Assessment - 08/21/16 1015    Subjective I can walk pretty good but I have to think about it every step of the way.     Currently in Pain? Yes   Pain Score 3    Pain Location Knee   Pain Orientation Left   Pain Score 5   Pain Location Back   Pain Orientation Left;Lower   Pain Descriptors / Indicators Burning   Pain Type Chronic pain   Pain Onset More than a month ago   Pain Frequency Constant                         [facility] Adult PT Treatment/Exercise - 08/21/16 1031      Knee/Hip Exercises: Standing  Heel Raises Both;1 set;20 reps   Hip Flexion AAROM;Stengthening;Both;1 set;10 reps   Hip Flexion Limitations march in parallel bars no UE support    Forward Lunges Left;1 set;10 reps   Forward Lunges Limitations mini lunge UE support    Hip Abduction Stengthening;Both;1 set;10 reps   Hip Extension Stengthening;Both;1 set;10 reps   Other Standing Knee Exercises declined step ups due to MD recommendation to hold off    Other Standing Knee Exercises wall slides single and double 10 X each with cues  4-5/10     Right /  LT back     Knee/Hip Exercises: Seated   Long Arc Quad Strengthening;Left;2 sets;10 reps   Long Arc Quad Weight 3 lbs.   Long Arc Quad Limitations ball squeeze    Ball Squeeze x 10, 5 sec hold    Hamstring Curl 2 sets;10 reps   Hamstring Limitations blue band    Sit to [employer] 1 set;without UE support  ball  between knees      Knee/Hip Exercises: Supine   Straight Leg Raises AROM;Strengthening;Left;1 set;10 reps   Straight Leg Raises Limitations cues for abdominals to protect back      Manual Therapy   Manual Therapy Joint mobilization   Joint Mobilization patellar mobs, added SAQ with mobilization into extension  Gr II ext and flexion in supine                 PT Education - 08/21/16 1408    Education provided Yes   Education Details progress, closed chain ex, neutral spine and core support    Person(s) Educated Patient   Methods Explanation   Comprehension Verbalized understanding          PT Short Term Goals - 08/19/16 1819      PT SHORT TERM GOAL #1   Baseline independent with exercises issued so far,    Time 3   Period Weeks   Status On-going     PT SHORT TERM GOAL #2   Title Pt will be able to go without cane in his home and not aggravate back pain most of the time.    Baseline cane intermittant,  does not use all the time.   Time 4   Period Weeks   Status On-going     PT SHORT TERM GOAL #3   Title Pt will achieve no more than 8 deg knee extension for improved gait, flexibility.    Baseline visually more than 8 degrees   Time 4   Period Weeks   Status On-going           PT Long Term Goals - 08/07/16 0954      PT LONG TERM GOAL #1   Title Pt will understand RICE, posture and body mechanics as it pertains to knee and back pain.    Time 8   Period Weeks   Status New     PT LONG TERM GOAL #2   Title Pt will be I with more advanced HEP for knee, core.    Time 8   Period Weeks   Status New     PT LONG TERM GOAL #3   Title Pt will less than 55% limited on FOTO to demo functional improvement   Time 8   Period Weeks   Status New     PT LONG TERM GOAL #4   Title Pt will be able to go outside and participate in family functions without limitations of knee pain.  Time 8   Period Weeks   Status New     PT LONG TERM GOAL #5   Title Pt will  be able to walk for 30 min for general health and fitness without lasting knee pain    Time 8   Period Weeks   Status New               Plan - 08/21/16 1409    Clinical Impression Statement Patient able to tolerate >50% of the time in standing.  Worked with visual cues to use both LE equally and maintain trunk alignment.  ROM measured at 116 AROM sitting edge of mat.  Declined ice at it makes his leg ache.    PT Next Visit Plan advance HEP  to incldue standing wall .  progress ROM and strength as tol, NuStep,  No step ups per MD    PT Home Exercise Plan has HHPT level 1-2 ex, given assisted knee flexion, ITB stretch and hamstring as well today.    Consulted and Agree with Plan of Care Patient      Patient will benefit from skilled therapeutic intervention in order to improve the following deficits and impairments:  Difficulty walking, Increased fascial restricitons, Pain, Hypomobility, Decreased scar mobility, Impaired flexibility, Increased edema, Decreased strength, Decreased mobility  Visit Diagnosis: Acute pain of left knee  Stiffness of left knee, not elsewhere classified  Localized edema  Difficulty in walking, not elsewhere classified     Problem List Patient Active Problem List   Diagnosis Date Noted  . Presence of left artificial knee joint 07/25/2016  . Pain and swelling of left lower leg 07/25/2016  . Bilateral primary osteoarthritis of knee 07/19/2016    Class: Chronic  . Primary localized osteoarthritis of left knee 07/19/2016  . 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 Hypotestosteronemia 09/08/2015  . Cervical spondylosis without myelopathy 03/25/2014    Class: Chronic  . Left cervical radiculopathy 03/25/2014    Class: Chronic  . Cervical spondylosis with radiculopathy 03/25/2014    Geary Rufo 08/21/2016, 2:14 PM  [place], [place], [address] Phone:  [phone]   Fax:  [phone]  Name: LARON BOORMAN MRN: [id number] Date of Birth: Jan 24, 1966  Raeford Razor, PT 08/21/16 2:14 PM Phone: [phone] Fax: [phone]

## 2016-08-23 ENCOUNTER — Ambulatory Visit: Payer: BLUE CROSS/BLUE SHIELD | Admitting: Physical Therapy

## 2016-08-23 DIAGNOSIS — M25662 Stiffness of left knee, not elsewhere classified: Secondary | ICD-10-CM

## 2016-08-23 DIAGNOSIS — R262 Difficulty in walking, not elsewhere classified: Secondary | ICD-10-CM

## 2016-08-23 DIAGNOSIS — M25562 Pain in left knee: Secondary | ICD-10-CM

## 2016-08-23 DIAGNOSIS — R600 Localized edema: Secondary | ICD-10-CM

## 2016-08-23 NOTE — Therapy (Signed)
[place], [place], [address] Phone: [phone]   Fax:  [phone]  Physical Therapy Treatment  Patient Details  Name: Jesus Brown MRN: [id number] Date of Birth: 1965/08/11 Referring Provider: Dr. Basil Dess  Encounter Date: 08/23/2016      PT End of Session - 08/23/16 1056    Visit Number 7   Number of Visits 24   Date for PT Re-Evaluation 10/01/16   PT Start Time 1010   PT Stop Time 1055   PT Time Calculation (min) 45 min   Activity Tolerance Patient tolerated treatment well   Behavior During Therapy [facility] for tasks assessed/performed      Past Medical History:  Diagnosis Date  . Anxiety    mainly r/t pain  . Arthritis    neck, knees  . Asthma    AS CHILD   . Back pain, chronic   . DDD (degenerative disc disease)   . Dysphagia   . 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 GERD (gastroesophageal reflux disease)   . Hypertension   . Hypotestosteronemia 09/08/2015  . Neck pain, chronic     Past Surgical History:  Procedure Laterality Date  . ANKLE SURGERY    . BACK SURGERY  2010,2011   lumb fusionx2  . CERVICAL FUSION  [id number]   3 surgeries  . DIRECT LARYNGOSCOPY N/A 11/09/2012   Procedure: DIRECT LARYNGOSCOPY;  Surgeon: Jodi Marble, MD;  Location: [place];  Service: ENT;  Laterality: N/A;  . ESOPHAGOGASTRODUODENOSCOPY N/A 08/03/2012   Procedure: ESOPHAGOGASTRODUODENOSCOPY (EGD);  Surgeon: Arta Silence, MD;  Location: [facility] ENDOSCOPY;  Service: Endoscopy;  Laterality: N/A;  . ESOPHAGOSCOPY W/ BOTOX INJECTION N/A 11/09/2012   Procedure: ESOPHAGOSCOPY POSSIBLE BIOPSY AND POSSIBLE BOTOX INJECTION CRICOPHARYNGEUS;  Surgeon: Jodi Marble, MD;  Location: [place];  Service: ENT;  Laterality: N/A;  Esophagoscopy with botox injection  . HERNIA REPAIR     ing-rt  . KNEE SURGERY     rt and left x2 each=4  . POSTERIOR CERVICAL FUSION/FORAMINOTOMY  N/A 03/25/2014   Procedure: LEFT C7-T1 FORAMINOTOMY;  Surgeon: Jessy Oto, MD;  Location: [place];  Service: Orthopedics;  Laterality: N/A;  . [place]   rt and lt  . TONSILLECTOMY    . TOTAL KNEE ARTHROPLASTY Left 07/19/2016   Procedure: LEFT TOTAL KNEE ARTHROPLASTY CORTISON INJECTION IN RIGHT KNEE;  Surgeon: Jessy Oto, MD;  Location: [place];  Service: Orthopedics;  Laterality: Left;  MAY NEED RNFA TO FINISH CASE PER [place]   . WRIST SURGERY     lt and rt    There were no vitals filed for this visit.      Subjective Assessment - 08/23/16 1009    Subjective Its popping today.  I noticed a small opening at the top of my scar.  It was draining a bit.  I'm watching it.     Currently in Pain? Yes   Pain Score 4    Pain Location Knee   Pain Orientation Left   Pain Descriptors / Indicators Throbbing  popping    Pain Type Surgical pain   Pain Onset More than a month ago   Pain Frequency Intermittent   Aggravating Factors  bending, overdoing it    Pain Relieving Factors ice, rest , changing positions    Pain Score 5   Pain Location Back   Pain Orientation Left;Lower   Pain Type Chronic pain  [place] Adult PT Treatment/Exercise - 08/23/16 1024      Lumbar Exercises: Machines for Strengthening   Leg Press 1 plate 3 x 10, added calf stretch and heel lifts as well, x 10      Knee/Hip Exercises: Stretches   Other Knee/Hip Stretches knee to chest 30 sec each      Knee/Hip Exercises: Aerobic   Recumbent Bike 5 minutes, L 3     Knee/Hip Exercises: Standing   Heel Raises Both;1 set;20 reps   Heel Raises Limitations no UE support    Side Lunges Right;Left;1 set;15 reps   Side Lunges Limitations towel slides cues for hip hinge    Terminal Knee Extension Limitations green band x 20    Hip Abduction Stengthening;Both;1 set;20 reps   Abduction Limitations min UE support    Hip Extension Stengthening;Both;1 set;20 reps;Knee  straight     Knee/Hip Exercises: Supine   Quad Sets AAROM;Strengthening;Left;1 set;20 reps   Quad Sets Limitations mob with movement (pulled tibia anteriorly with strap) x 20    Bridges Limitations x 15 reps   Heel Prop for Knee Extension 2 minutes   Knee Extension AAROM;Left   Knee Extension Limitations passvie ext on bolster      Cryotherapy   Number Minutes Cryotherapy --  declined      Manual Therapy   Joint Mobilization GR. I - II extension                   PT Short Term Goals - 08/19/16 1819      PT SHORT TERM GOAL #1   Baseline independent with exercises issued so far,    Time 3   Period Weeks   Status On-going     PT SHORT TERM GOAL #2   Title Pt will be able to go without cane in his home and not aggravate back pain most of the time.    Baseline cane intermittant,  does not use all the time.   Time 4   Period Weeks   Status On-going     PT SHORT TERM GOAL #3   Title Pt will achieve no more than 8 deg knee extension for improved gait, flexibility.    Baseline visually more than 8 degrees   Time 4   Period Weeks   Status On-going           PT Long Term Goals - 08/07/16 0954      PT LONG TERM GOAL #1   Title Pt will understand RICE, posture and body mechanics as it pertains to knee and back pain.    Time 8   Period Weeks   Status New     PT LONG TERM GOAL #2   Title Pt will be I with more advanced HEP for knee, core.    Time 8   Period Weeks   Status New     PT LONG TERM GOAL #3   Title Pt will less than 55% limited on FOTO to demo functional improvement   Time 8   Period Weeks   Status New     PT LONG TERM GOAL #4   Title Pt will be able to go outside and participate in family functions without limitations of knee pain.    Time 8   Period Weeks   Status New     PT LONG TERM GOAL #5   Title Pt will be able to walk for 30 min for general health and fitness without lasting  knee pain    Time 8   Period Weeks   Status New                Plan - 08/23/16 1057    Clinical Impression Statement Doing well, No tightness or popping post session. Urged hiim to watch the scar and keep clean to avoid infection.     PT Next Visit Plan advance HEP  to incldue standing wall .  progress ROM and strength as tol, NuStep,  No step ups per MD    PT Home Exercise Plan has HHPT level 1-2 ex, given assisted knee flexion, ITB stretch and hamstring as well today.    Consulted and Agree with Plan of Care Patient      Patient will benefit from skilled therapeutic intervention in order to improve the following deficits and impairments:  Difficulty walking, Increased fascial restricitons, Pain, Hypomobility, Decreased scar mobility, Impaired flexibility, Increased edema, Decreased strength, Decreased mobility  Visit Diagnosis: Acute pain of left knee  Stiffness of left knee, not elsewhere classified  Localized edema  Difficulty in walking, not elsewhere classified     Problem List Patient Active Problem List   Diagnosis Date Noted  . Presence of left artificial knee joint 07/25/2016  . Pain and swelling of left lower leg 07/25/2016  . Bilateral primary osteoarthritis of knee 07/19/2016    Class: Chronic  . Primary localized osteoarthritis of left knee 07/19/2016  . 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 Hypotestosteronemia 09/08/2015  . Cervical spondylosis without myelopathy 03/25/2014    Class: Chronic  . Left cervical radiculopathy 03/25/2014    Class: Chronic  . Cervical spondylosis with radiculopathy 03/25/2014    Emerson Schreifels 08/23/2016, 10:58 AM  [place], [place], [address] Phone: [phone]   Fax:  [phone]  Name: Jesus Brown MRN: [id number] Date of Birth: 09-03-1965  Raeford Razor, PT 08/23/16 10:59 AM Phone: [phone] Fax: [phone]

## 2016-09-03 ENCOUNTER — Ambulatory Visit: Payer: BLUE CROSS/BLUE SHIELD | Admitting: Physical Therapy

## 2016-09-03 ENCOUNTER — Encounter: Payer: Self-pay | Admitting: Physical Therapy

## 2016-09-03 DIAGNOSIS — M25562 Pain in left knee: Secondary | ICD-10-CM

## 2016-09-03 DIAGNOSIS — R600 Localized edema: Secondary | ICD-10-CM

## 2016-09-03 DIAGNOSIS — M25662 Stiffness of left knee, not elsewhere classified: Secondary | ICD-10-CM

## 2016-09-03 DIAGNOSIS — R262 Difficulty in walking, not elsewhere classified: Secondary | ICD-10-CM

## 2016-09-03 NOTE — Therapy (Signed)
[place], [place], [address] Phone: [phone]   Fax:  [phone]  Physical Therapy Treatment  Patient Details  Name: Jesus Brown MRN: [id number] Date of Birth: 1965-10-25 Referring Provider: Dr. Basil Dess  Encounter Date: 09/03/2016      PT End of Session - 09/03/16 1251    Visit Number 8   Number of Visits 24   Date for PT Re-Evaluation 10/01/16   PT Start Time 1146   PT Stop Time 1234   PT Time Calculation (min) 48 min   Activity Tolerance Patient tolerated treatment well   Behavior During Therapy [facility] for tasks assessed/performed      Past Medical History:  Diagnosis Date  . Anxiety    mainly r/t pain  . Arthritis    neck, knees  . Asthma    AS CHILD   . Back pain, chronic   . DDD (degenerative disc disease)   . Dysphagia   . 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 GERD (gastroesophageal reflux disease)   . Hypertension   . Hypotestosteronemia 09/08/2015  . Neck pain, chronic     Past Surgical History:  Procedure Laterality Date  . ANKLE SURGERY    . BACK SURGERY  2010,2011   lumb fusionx2  . CERVICAL FUSION  [id number]   3 surgeries  . DIRECT LARYNGOSCOPY N/A 11/09/2012   Procedure: DIRECT LARYNGOSCOPY;  Surgeon: Jodi Marble, MD;  Location: [place];  Service: ENT;  Laterality: N/A;  . ESOPHAGOGASTRODUODENOSCOPY N/A 08/03/2012   Procedure: ESOPHAGOGASTRODUODENOSCOPY (EGD);  Surgeon: Arta Silence, MD;  Location: [facility] ENDOSCOPY;  Service: Endoscopy;  Laterality: N/A;  . ESOPHAGOSCOPY W/ BOTOX INJECTION N/A 11/09/2012   Procedure: ESOPHAGOSCOPY POSSIBLE BIOPSY AND POSSIBLE BOTOX INJECTION CRICOPHARYNGEUS;  Surgeon: Jodi Marble, MD;  Location: [place];  Service: ENT;  Laterality: N/A;  Esophagoscopy with botox injection  . HERNIA REPAIR     ing-rt  . KNEE SURGERY     rt and left x2 each=4  . POSTERIOR CERVICAL FUSION/FORAMINOTOMY  N/A 03/25/2014   Procedure: LEFT C7-T1 FORAMINOTOMY;  Surgeon: Jessy Oto, MD;  Location: [place];  Service: Orthopedics;  Laterality: N/A;  . [place]   rt and lt  . TONSILLECTOMY    . TOTAL KNEE ARTHROPLASTY Left 07/19/2016   Procedure: LEFT TOTAL KNEE ARTHROPLASTY CORTISON INJECTION IN RIGHT KNEE;  Surgeon: Jessy Oto, MD;  Location: [place];  Service: Orthopedics;  Laterality: Left;  MAY NEED RNFA TO FINISH CASE PER [place]   . WRIST SURGERY     lt and rt    There were no vitals filed for this visit.      Subjective Assessment - 09/03/16 1149    Subjective Knee 4/10.  Back 6/10. I feel like I'm starting to be happy I had the surgery,  Sat. I had throbbing down my shin and up.     Currently in Pain? Yes   Pain Score 4    Pain Location Knee   Pain Orientation Left   Pain Descriptors / Indicators Throbbing   Pain Type Surgical pain   Pain Onset More than a month ago   Pain Frequency Intermittent   Aggravating Factors  standing too long    Pain Relieving Factors ice, gentle stretching    Multiple Pain Sites Yes   Pain Score 6   Pain Location Back   Pain Orientation Left;Lower   Pain Type Chronic pain  Pain Onset More than a month ago   Pain Frequency Constant            [facility] PT Assessment - 09/03/16 1223      AROM   Left Knee Extension 15  PROM 12 deg    Left Knee Flexion 123                [facility] Adult PT Treatment/Exercise - 09/03/16 1207      Knee/Hip Exercises: Stretches   Active Hamstring Stretch 3 reps;30 seconds   Quad Stretch Limitations 10 X 10 seconds,  prone with strap and standing foot on step   Knee: Self-Stretch to increase Flexion 3 reps;30 seconds   ITB Stretch Left;3 reps;30 seconds   Other Knee/Hip Stretches knee to chest 30 sec each      Knee/Hip Exercises: Standing   Lateral Step Up Left;1 set;20 reps;Hand Hold: 2;Step Height: 4"   Forward Step Up Left;1 set;20 reps;Hand Hold: 2;Step Height: 4"   Functional Squat  1 set;10 reps   Functional Squat Limitations in parallel bars, mirror, cues for hip hinge    Walking with [employer] weighted gait FW and back   4 plates x 5 each direction    Other Standing Knee Exercises semi circles and back lunge with LLE in standing      Manual Therapy   Joint Mobilization GR. I - II extension    Passive ROM flexion and extension                 PT Education - 09/03/16 1251    Education provided Yes   Education Details stairs, gait , knee ext    Person(s) Educated Patient   Methods Explanation   Comprehension Verbalized understanding;Returned demonstration          PT Short Term Goals - 09/03/16 1254      PT SHORT TERM GOAL #1   Title Pt will be I with initial HEP for ROM and strength in L. knee    Status Achieved     PT SHORT TERM GOAL #2   Title Pt will be able to go without cane in his home and not aggravate back pain most of the time.    Status Achieved     PT SHORT TERM GOAL #3   Title Pt will achieve no more than 8 deg knee extension for improved gait, flexibility.    Status On-going           PT Long Term Goals - 09/03/16 1255      PT LONG TERM GOAL #1   Title Pt will understand RICE, posture and body mechanics as it pertains to knee and back pain.    Status On-going     PT LONG TERM GOAL #2   Title Pt will be I with more advanced HEP for knee, core.    Status On-going     PT LONG TERM GOAL #3   Title Pt will less than 55% limited on FOTO to demo functional improvement   Status Unable to assess     PT LONG TERM GOAL #4   Title Pt will be able to go outside and participate in family functions without limitations of knee pain.    Status On-going     PT LONG TERM GOAL #5   Title Pt will be able to walk for 30 min for general health and fitness without lasting knee pain    Status On-going  Plan - 09/03/16 1252    Clinical Impression Statement Pt with improved gait, knee flexion and  overall mobility.  Continues to have post knee tightness which limits heel strike and comfort in supine.    PT Next Visit Plan advance HEP  to include standing wall .  progress ROM and strength as tol, NuStep, manual in ext    PT Home Exercise Plan has HHPT level 1-2 ex, given assisted knee flexion, ITB stretch and hamstring as well today.    Consulted and Agree with Plan of Care Patient      Patient will benefit from skilled therapeutic intervention in order to improve the following deficits and impairments:  Difficulty walking, Increased fascial restricitons, Pain, Hypomobility, Decreased scar mobility, Impaired flexibility, Increased edema, Decreased strength, Decreased mobility  Visit Diagnosis: Acute pain of left knee  Stiffness of left knee, not elsewhere classified  Localized edema  Difficulty in walking, not elsewhere classified     Problem List Patient Active Problem List   Diagnosis Date Noted  . Presence of left artificial knee joint 07/25/2016  . Pain and swelling of left lower leg 07/25/2016  . Bilateral primary osteoarthritis of knee 07/19/2016    Class: Chronic  . Primary localized osteoarthritis of left knee 07/19/2016  . 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 Hypotestosteronemia 09/08/2015  . Cervical spondylosis without myelopathy 03/25/2014    Class: Chronic  . Left cervical radiculopathy 03/25/2014    Class: Chronic  . Cervical spondylosis with radiculopathy 03/25/2014    Quenisha Lovins 09/03/2016, 12:57 PM  [address] [place], [place], [address] Phone: [phone]   Fax:  [phone]  Name: Jesus Brown MRN: [id number] Date of Birth: 1965-09-23  Raeford Razor, PT 09/03/16 12:58 PM Phone: [phone] Fax: [phone]

## 2016-09-04 ENCOUNTER — Encounter: Payer: BLUE CROSS/BLUE SHIELD | Admitting: Physical Therapy

## 2016-09-06 ENCOUNTER — Encounter: Payer: BLUE CROSS/BLUE SHIELD | Admitting: Physical Therapy

## 2016-09-09 ENCOUNTER — Encounter: Payer: Self-pay | Admitting: Physical Therapy

## 2016-09-09 ENCOUNTER — Ambulatory Visit: Payer: BLUE CROSS/BLUE SHIELD | Attending: Specialist | Admitting: Physical Therapy

## 2016-09-09 DIAGNOSIS — M25562 Pain in left knee: Secondary | ICD-10-CM

## 2016-09-09 DIAGNOSIS — M25662 Stiffness of left knee, not elsewhere classified: Secondary | ICD-10-CM

## 2016-09-09 DIAGNOSIS — R262 Difficulty in walking, not elsewhere classified: Secondary | ICD-10-CM | POA: Diagnosis present

## 2016-09-09 DIAGNOSIS — R600 Localized edema: Secondary | ICD-10-CM | POA: Diagnosis present

## 2016-09-09 NOTE — Therapy (Signed)
[place], [place], [address] Phone: [phone]   Fax:  [phone]  Physical Therapy Treatment  Patient Details  Name: Jesus Brown MRN: [id number] Date of Birth: 1965/07/04 Referring Provider: Dr. Basil Dess  Encounter Date: 09/09/2016      PT End of Session - 09/09/16 1027    Visit Number 9   Number of Visits 24   Date for PT Re-Evaluation 10/01/16   PT Start Time 1016   PT Stop Time 1059   PT Time Calculation (min) 43 min   Activity Tolerance Patient tolerated treatment well   Behavior During Therapy [facility] for tasks assessed/performed      Past Medical History:  Diagnosis Date  . Anxiety    mainly r/t pain  . Arthritis    neck, knees  . Asthma    AS CHILD   . Back pain, chronic   . DDD (degenerative disc disease)   . Dysphagia   . 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 GERD (gastroesophageal reflux disease)   . Hypertension   . Hypotestosteronemia 09/08/2015  . Neck pain, chronic     Past Surgical History:  Procedure Laterality Date  . ANKLE SURGERY    . BACK SURGERY  2010,2011   lumb fusionx2  . CERVICAL FUSION  9233,0076   3 surgeries  . DIRECT LARYNGOSCOPY N/A 11/09/2012   Procedure: DIRECT LARYNGOSCOPY;  Surgeon: Jodi Marble, MD;  Location: [place];  Service: ENT;  Laterality: N/A;  . ESOPHAGOGASTRODUODENOSCOPY N/A 08/03/2012   Procedure: ESOPHAGOGASTRODUODENOSCOPY (EGD);  Surgeon: Arta Silence, MD;  [facility] END[facility]COPY;  Service: Endoscopy;  Laterality: N/A;  . ESOPHAGOSCOPY W/ BOTOX INJECTION N/A 11/09/2012   Procedure: ESOPHAGOSCOPY POSSIBLE BIOPSY AND POSSIBLE BOTOX INJECTION CRICOPHARYNGEUS;  Surgeon: Jodi Marble, MD;  Location: [place];  Service: ENT;  Laterality: N/A;  Esophagoscopy with botox injection  . HERNIA REPAIR     ing-rt  . KNEE SURGERY     rt and left x2 each=4  . POSTERIOR CERVICAL FUSION/FORAMINOTOMY N/A  03/25/2014   Procedure: LEFT C7-T1 FORAMINOTOMY;  Surgeon: Jessy Oto, MD;  Location: [place];  Service: Orthopedics;  Laterality: N/A;  . [place]   rt and lt  . TONSILLECTOMY    . TOTAL KNEE ARTHROPLASTY Left 07/19/2016   Procedure: LEFT TOTAL KNEE ARTHROPLASTY CORTISON INJECTION IN RIGHT KNEE;  Surgeon: Jessy Oto, MD;  Location: [place];  Service: Orthopedics;  Laterality: Left;  MAY NEED RNFA TO FINISH CASE PER [place]   . WRIST SURGERY     lt and rt    There were no vitals filed for this visit.      Subjective Assessment - 09/09/16 1022    Subjective Knee pain 6/10-7/10 its a different pain though, into thigh and lower leg.    Currently in Pain? Yes   Pain Score 6    Pain Location Knee   Pain Orientation Left   Pain Descriptors / Indicators Throbbing   Pain Type Surgical pain;Chronic pain   Pain Onset More than a month ago   Pain Frequency Intermittent   Aggravating Factors  standing too long, walking    Pain Relieving Factors ice, stretching, rest    Pain Score 7   Pain Location Back   Pain Orientation Left;Lower   Pain Type Chronic pain   Aggravating Factors  the drive back from [place] (5 hours)  [place] Adult PT Treatment/Exercise - 09/09/16 1039      Knee/Hip Exercises: Stretches   Active Hamstring Stretch 3 reps;30 seconds   Active Hamstring Stretch Limitations used contract relax x 3    ITB Stretch Left;3 reps;30 seconds   Gastroc Stretch Both;2 reps;30 seconds   Soleus Stretch Both;2 reps;30 seconds     Knee/Hip Exercises: Aerobic   Recumbent Bike 5 minutes, L 3     Knee/Hip Exercises: Standing   Lateral Step Up Left;1 set;20 reps;Hand Hold: 2;Step Height: 6"   Forward Step Up Left;1 set;20 reps;Hand Hold: 2;Step Height: 6"   SLS 30 sec x 3 min rare UE support     Knee/Hip Exercises: Supine   Heel Slides AAROM;Strengthening;Both;1 set;15 reps   Heel Slides Limitations with ball    Bridges Limitations  ball bridges x 10    Straight Leg Raises Strengthening;Both;1 set;10 reps   Straight Leg Raises Limitations from ball      Manual Therapy   Joint Mobilization GR. I - II extension    Soft tissue mobilization quads , scar tissue massage                 PT Education - 09/09/16 1130    Education provided No          PT Short Term Goals - 09/09/16 1025      PT SHORT TERM GOAL #1   Title Pt will be I with initial HEP for ROM and strength in L. knee    Status Achieved     PT SHORT TERM GOAL #2   Title Pt will be able to go without cane in his home and not aggravate back pain most of the time.    Status Achieved     PT SHORT TERM GOAL #3   Title Pt will achieve no more than 8 deg knee extension for improved gait, flexibility.    Status On-going           PT Long Term Goals - 09/09/16 1025      PT LONG TERM GOAL #1   Title Pt will understand RICE, posture and body mechanics as it pertains to knee and back pain.    Status On-going     PT LONG TERM GOAL #2   Title Pt will be I with more advanced HEP for knee, core.    Status On-going     PT LONG TERM GOAL #3   Title Pt will less than 55% limited on FOTO to demo functional improvement   Status On-going     PT LONG TERM GOAL #4   Title Pt will be able to go outside and participate in family functions without limitations of knee pain.    Status On-going     PT LONG TERM GOAL #5   Title Pt will be able to walk for 30 min for general health and fitness without lasting knee pain    Baseline <15 min being on it at all    Status On-going               Plan - 09/09/16 1026    Clinical Impression Statement Sees MD Louanne Skye Friday. Limited in extension.  Pt with increased pain and heat in knee nightly, and when he is up on his leg > 10 min. No further goals met.    PT Next Visit Plan FOTO advance HEP  to include standing wall .  progress ROM and strength as tol, NuStep, manual in  ext    PT Home Exercise Plan has  HHPT level 1-2 ex, given assisted knee flexion, ITB stretch and hamstring as well today.    Consulted and Agree with Plan of Care Patient      Patient will benefit from skilled therapeutic intervention in order to improve the following deficits and impairments:  Difficulty walking, Increased fascial restricitons, Pain, Hypomobility, Decreased scar mobility, Impaired flexibility, Increased edema, Decreased strength, Decreased mobility  Visit Diagnosis: Acute pain of left knee  Stiffness of left knee, not elsewhere classified  Localized edema  Difficulty in walking, not elsewhere classified     Problem List Patient Active Problem List   Diagnosis Date Noted  . Presence of left artificial knee joint 07/25/2016  . Pain and swelling of left lower leg 07/25/2016  . Bilateral primary osteoarthritis of knee 07/19/2016    Class: Chronic  . Primary localized osteoarthritis of left knee 07/19/2016  . 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 Hypotestosteronemia 09/08/2015  . Cervical spondylosis without myelopathy 03/25/2014    Class: Chronic  . Left cervical radiculopathy 03/25/2014    Class: Chronic  . Cervical spondylosis with radiculopathy 03/25/2014    PAA,JENNIFER 09/09/2016, 11:36 AM  [facility] [address] [place], [place], [address] Phone: [phone]   Fax:  [phone]  Name: Jesus Brown MRN: [id number] Date of Birth: [birth year]  Raeford Razor, PT 09/09/16 11:37 AM Phone: [phone] Fax: [phone]

## 2016-09-10 ENCOUNTER — Ambulatory Visit: Payer: BLUE CROSS/BLUE SHIELD | Admitting: Physical Therapy

## 2016-09-10 ENCOUNTER — Encounter: Payer: Self-pay | Admitting: Physical Therapy

## 2016-09-10 DIAGNOSIS — R600 Localized edema: Secondary | ICD-10-CM

## 2016-09-10 DIAGNOSIS — M25562 Pain in left knee: Secondary | ICD-10-CM

## 2016-09-10 DIAGNOSIS — M25662 Stiffness of left knee, not elsewhere classified: Secondary | ICD-10-CM

## 2016-09-10 DIAGNOSIS — R262 Difficulty in walking, not elsewhere classified: Secondary | ICD-10-CM

## 2016-09-10 NOTE — Therapy (Addendum)
[place], [place], [address] Phone: [phone]   Fax:  [phone]  Physical Therapy Treatment  Patient Details  Name: Jesus Brown MRN: [id number] Date of Birth: 31-Oct-1965 Referring Provider: Dr. Basil Dess  Encounter Date: 09/10/2016      PT End of Session - 09/10/16 1755    Visit Number 10   Number of Visits 24   Date for PT Re-Evaluation 10/01/16   PT Start Time 1111  short session due to PTA late from previous patient,   PT Stop Time 1145   PT Time Calculation (min) 34 min   Activity Tolerance Patient tolerated treatment well   Behavior During Therapy [facility] for tasks assessed/performed      Past Medical History:  Diagnosis Date  . Anxiety    mainly r/t pain  . Arthritis    neck, knees  . Asthma    AS CHILD   . Back pain, chronic   . DDD (degenerative disc disease)   . Dysphagia   . 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 GERD (gastroesophageal reflux disease)   . Hypertension   . Hypotestosteronemia 09/08/2015  . Neck pain, chronic     Past Surgical History:  Procedure Laterality Date  . ANKLE SURGERY    . BACK SURGERY  2010,2011   lumb fusionx2  . CERVICAL FUSION  3354,5625   3 surgeries  . DIRECT LARYNGOSCOPY N/A 11/09/2012   Procedure: DIRECT LARYNGOSCOPY;  Surgeon: Jodi Marble, MD;  Location: [place];  Service: ENT;  Laterality: N/A;  . ESOPHAGOGASTRODUODENOSCOPY N/A 08/03/2012   Procedure: ESOPHAGOGASTRODUODENOSCOPY (EGD);  Surgeon: Arta Silence, MD;  Location: [facility] ENDOSCOPY;  Service: Endoscopy;  Laterality: N/A;  . ESOPHAGOSCOPY W/ BOTOX INJECTION N/A 11/09/2012   Procedure: ESOPHAGOSCOPY POSSIBLE BIOPSY AND POSSIBLE BOTOX INJECTION CRICOPHARYNGEUS;  Surgeon: Jodi Marble, MD;  Location: [place];  Service: ENT;  Laterality: N/A;  Esophagoscopy with botox injection  . HERNIA REPAIR     ing-rt  . KNEE SURGERY     rt and left  x2 each=4  . POSTERIOR CERVICAL FUSION/FORAMINOTOMY N/A 03/25/2014   Procedure: LEFT C7-T1 FORAMINOTOMY;  Surgeon: Jessy Oto, MD;  Location: [place];  Service: Orthopedics;  Laterality: N/A;  . [place]   rt and lt  . TONSILLECTOMY    . TOTAL KNEE ARTHROPLASTY Left 07/19/2016   Procedure: LEFT TOTAL KNEE ARTHROPLASTY CORTISON INJECTION IN RIGHT KNEE;  Surgeon: Jessy Oto, MD;  Location: [place];  Service: Orthopedics;  Laterality: Left;  MAY NEED RNFA TO FINISH CASE PER [place]   . WRIST SURGERY     lt and rt    There were no vitals filed for this visit.      Subjective Assessment - 09/10/16 1746    Subjective Pain continues to be 7-8/10 today.  He continues to work on his HEP/  FOTO  10th visit 32% ability at intake 30.                           [place] Adult PT Treatment/Exercise - 09/10/16 0001      Knee/Hip Exercises: Stretches   Other Knee/Hip Stretches thomas test stretch with quad stretch 3 x 30     Knee/Hip Exercises: Standing   Stairs 4 inch, 2 rails cues 4 X 5-6 steps able to do step over step      Knee/Hip Exercises: Seated  Long Arc [employer] Strengthening;10 reps   Long Arc [employer] 5 lbs.   Long [employer] Limitations 10 second hold and release     Manual Therapy   Manual Therapy --  retrograde for edema.    Soft tissue mobilization scar tissue work, patellar mobs,  PROM extension  medial / lateral knee quads   Passive ROM extension                PT Education - 09/10/16 1755    Education provided Yes   Education Details gait training   Person(s) Educated Patient   Methods Explanation;Demonstration   Comprehension Verbalized understanding;Returned demonstration          PT Short Term Goals - 09/09/16 1025      PT SHORT TERM GOAL #1   Title Pt will be I with initial HEP for ROM and strength in L. knee    Status Achieved     PT SHORT TERM GOAL #2   Title Pt will be able to go without cane in his home and not  aggravate back pain most of the time.    Status Achieved     PT SHORT TERM GOAL #3   Title Pt will achieve no more than 8 deg knee extension for improved gait, flexibility.    Status On-going           PT Long Term Goals - 09/09/16 1025      PT LONG TERM GOAL #1   Title Pt will understand RICE, posture and body mechanics as it pertains to knee and back pain.    Status On-going     PT LONG TERM GOAL #2   Title Pt will be I with more advanced HEP for knee, core.    Status On-going     PT LONG TERM GOAL #3   Title Pt will less than 55% limited on FOTO to demo functional improvement   Status On-going     PT LONG TERM GOAL #4   Title Pt will be able to go outside and participate in family functions without limitations of knee pain.    Status On-going     PT LONG TERM GOAL #5   Title Pt will be able to walk for 30 min for general health and fitness without lasting knee pain    Baseline <15 min being on it at all    Status On-going       GCODE Mobility Current CL Goal CK         Plan - 09/10/16 1756    Clinical Impression Statement FOTO :   32 % ability.  Intake:  30 % ability.   PT Next Visit Plan advance HEP  to include standing wall .  progress ROM and strength as tol, NuStep, manual in ext    PT Home Exercise Plan has HHPT level 1-2 ex, given assisted knee flexion, ITB stretch and hamstring as well today.    Consulted and Agree with Plan of Care Patient      Patient will benefit from skilled therapeutic intervention in order to improve the following deficits and impairments:  Difficulty walking, Increased fascial restricitons, Pain, Hypomobility, Decreased scar mobility, Impaired flexibility, Increased edema, Decreased strength, Decreased mobility  Visit Diagnosis: Acute pain of left knee  Stiffness of left knee, not elsewhere classified  Localized edema  Difficulty in walking, not elsewhere classified     Problem List Patient Active Problem List    Diagnosis Date Noted  .  Presence of left artificial knee joint 07/25/2016  . Pain and swelling of left lower leg 07/25/2016  . Bilateral primary osteoarthritis of knee 07/19/2016    Class: Chronic  . Primary localized osteoarthritis of left knee 07/19/2016  . 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 Hypotestosteronemia 09/08/2015  . Cervical spondylosis without myelopathy 03/25/2014    Class: Chronic  . Left cervical radiculopathy 03/25/2014    Class: Chronic  . Cervical spondylosis with radiculopathy 03/25/2014    Kenyatta Keidel PTA 09/10/2016, 6:00 PM  [place], [place], [address] Phone: [phone]   Fax:  [phone]  Name: KATRELL MILHORN MRN: [id number] Date of Birth: 31-Dec-1965  Raeford Razor, PT 09/12/16 2:42 PM Phone: [phone] Fax: [phone]

## 2016-09-12 ENCOUNTER — Encounter: Payer: Self-pay | Admitting: Physical Therapy

## 2016-09-12 ENCOUNTER — Ambulatory Visit: Payer: BLUE CROSS/BLUE SHIELD | Admitting: Physical Therapy

## 2016-09-12 DIAGNOSIS — M25562 Pain in left knee: Secondary | ICD-10-CM | POA: Diagnosis not present

## 2016-09-12 DIAGNOSIS — M25662 Stiffness of left knee, not elsewhere classified: Secondary | ICD-10-CM

## 2016-09-12 DIAGNOSIS — R262 Difficulty in walking, not elsewhere classified: Secondary | ICD-10-CM

## 2016-09-12 DIAGNOSIS — R600 Localized edema: Secondary | ICD-10-CM

## 2016-09-12 NOTE — Therapy (Signed)
[place], [place], [address] Phone: [phone]   Fax:  [phone]  Physical Therapy Treatment  Patient Details  Name: Jesus Brown MRN: [id number] Date of Birth: [birth year] Referring Provider: Dr. Basil Dess  Encounter Date: 09/12/2016      PT End of Session - 09/12/16 1024    Visit Number 11   Number of Visits 24   Date for PT Re-Evaluation 10/01/16   PT Start Time 1017   PT Stop Time 1105   PT Time Calculation (min) 48 min   Activity Tolerance Patient tolerated treatment well   Behavior During Therapy [facility] for tasks assessed/performed      Past Medical History:  Diagnosis Date  . Anxiety    mainly r/t pain  . Arthritis    neck, knees  . Asthma    AS CHILD   . Back pain, chronic   . DDD (degenerative disc disease)   . Dysphagia   . 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 GERD (gastroesophageal reflux disease)   . Hypertension   . Hypotestosteronemia 09/08/2015  . Neck pain, chronic     Past Surgical History:  Procedure Laterality Date  . ANKLE SURGERY    . BACK SURGERY  2010,2011   lumb fusionx2  . CERVICAL FUSION  2831,5176   3 surgeries  . DIRECT LARYNGOSCOPY N/A 11/09/2012   Procedure: DIRECT LARYNGOSCOPY;  Surgeon: Jodi Marble, MD;  Location: [place];  Service: ENT;  Laterality: N/A;  . ESOPHAGOGASTRODUODENOSCOPY N/A 08/03/2012   Procedure: ESOPHAGOGASTRODUODENOSCOPY (EGD);  Surgeon: Arta Silence, MD;  Location: [facility] END[facility]COPY;  Service: Endoscopy;  Laterality: N/A;  . ESOPHAGOSCOPY W/ BOTOX INJECTION N/A 11/09/2012   Procedure: ESOPHAGOSCOPY POSSIBLE BIOPSY AND POSSIBLE BOTOX INJECTION CRICOPHARYNGEUS;  Surgeon: Jodi Marble, MD;  Location: [place];  Service: ENT;  Laterality: N/A;  Esophagoscopy with botox injection  . HERNIA REPAIR     ing-rt  . KNEE SURGERY     rt and left x2 each=4  . POSTERIOR CERVICAL FUSION/FORAMINOTOMY  N/A 03/25/2014   Procedure: LEFT C7-T1 FORAMINOTOMY;  Surgeon: Jessy Oto, MD;  Location: [place];  Service: Orthopedics;  Laterality: N/A;  . [place]   rt and lt  . TONSILLECTOMY    . TOTAL KNEE ARTHROPLASTY Left 07/19/2016   Procedure: LEFT TOTAL KNEE ARTHROPLASTY CORTISON INJECTION IN RIGHT KNEE;  Surgeon: Jessy Oto, MD;  Location: [place];  Service: Orthopedics;  Laterality: Left;  MAY NEED RNFA TO FINISH CASE PER [place]   . WRIST SURGERY     lt and rt    There were no vitals filed for this visit.      Subjective Assessment - 09/12/16 1022    Subjective Lt knee popping, seems more swollen.  Rt. one is hurting today too.     Currently in Pain? Yes   Pain Score 7    Pain Location Knee   Pain Orientation Left   Pain Descriptors / Indicators Tightness   Pain Type Chronic pain;Surgical pain   Pain Onset More than a month ago   Pain Frequency Intermittent            [facility] Adult PT Treatment/Exercise - 09/12/16 1024      Knee/Hip Exercises: Stretches   Active Hamstring Stretch Left;3 reps   ITB Stretch Left;3 reps;30 seconds   Other Knee/Hip Stretches thomas test stretch with quad stretch 3 x 30  Knee/Hip Exercises: Aerobic   Recumbent Bike 5 minutes, L 3     Knee/Hip Exercises: Standing   Other Standing Knee Exercises weight shifting at the wall x 10 cues for quad and glute      Knee/Hip Exercises: Supine   Straight Leg Raises Strengthening;Left;1 set;15 reps   Straight Leg Raise with External Rotation Strengthening;Left;1 set;15 reps   Patellar Mobs gentle      Knee/Hip Exercises: Sidelying   Hip ADduction Strengthening;Left;1 set;10 reps     Modalities   Modalities Cryotherapy;Electrical Stimulation     Cryotherapy   Number Minutes Cryotherapy 15 Minutes   Cryotherapy Location Knee   Type of Cryotherapy Ice pack     Electrical Stimulation   Electrical Stimulation Location L knee    Electrical Stimulation Action IFC   Electrical  Stimulation Parameters to Adult nurse Goals Edema;Pain                PT Education - 09/12/16 1049    Education provided Yes   Education Details IFC/TENS for pain, hip adduction    Person(s) Educated Patient   Methods Explanation   Comprehension Verbalized understanding          PT Short Term Goals - 09/09/16 1025      PT SHORT TERM GOAL #1   Title Pt will be I with initial HEP for ROM and strength in L. knee    Status Achieved     PT SHORT TERM GOAL #2   Title Pt will be able to go without cane in his home and not aggravate back pain most of the time.    Status Achieved     PT SHORT TERM GOAL #3   Title Pt will achieve no more than 8 deg knee extension for improved gait, flexibility.    Status On-going           PT Long Term Goals - 09/09/16 1025      PT LONG TERM GOAL #1   Title Pt will understand RICE, posture and body mechanics as it pertains to knee and back pain.    Status On-going     PT LONG TERM GOAL #2   Title Pt will be I with more advanced HEP for knee, core.    Status On-going     PT LONG TERM GOAL #3   Title Pt will less than 55% limited on FOTO to demo functional improvement   Status On-going     PT LONG TERM GOAL #4   Title Pt will be able to go outside and participate in family functions without limitations of knee pain.    Status On-going     PT LONG TERM GOAL #5   Title Pt will be able to walk for 30 min for general health and fitness without lasting knee pain    Baseline <15 min being on it at all    Status On-going               Plan - 09/12/16 1050    Clinical Impression Statement Pt continues to have pain and more swelling today.  He has been up doing more since his young son is home and has been trying to "keep up" with him. Progressing, but slowly due to Rt. knee pain, back pain which is chronic.  ROM improving.     PT Next Visit Plan advance HEP  to include standing wall .  progress ROM and  strength as tol,  NuStep, manual in ext  Slowly with stairs    PT Home Exercise Plan has HHPT level 1-2 ex, given assisted knee flexion, ITB stretch and hamstring as well today. , SLR with ER and hip adduction.    Consulted and Agree with Plan of Care Patient      Patient will benefit from skilled therapeutic intervention in order to improve the following deficits and impairments:  Difficulty walking, Increased fascial restricitons, Pain, Hypomobility, Decreased scar mobility, Impaired flexibility, Increased edema, Decreased strength, Decreased mobility  Visit Diagnosis: Acute pain of left knee  Stiffness of left knee, not elsewhere classified  Localized edema  Difficulty in walking, not elsewhere classified     Problem List Patient Active Problem List   Diagnosis Date Noted  . Presence of left artificial knee joint 07/25/2016  . Pain and swelling of left lower leg 07/25/2016  . Bilateral primary osteoarthritis of knee 07/19/2016    Class: Chronic  . Primary localized osteoarthritis of left knee 07/19/2016  . 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 Hypotestosteronemia 09/08/2015  . Cervical spondylosis without myelopathy 03/25/2014    Class: Chronic  . Left cervical radiculopathy 03/25/2014    Class: Chronic  . Cervical spondylosis with radiculopathy 03/25/2014    PAA,JENNIFER 09/12/2016, 11:18 AM  [place], [place], [address] Phone: [phone]   Fax:  [phone]  Name: KENSINGTON DUERST MRN: [id number] Date of Birth: Feb 07, 1966  Raeford Razor, PT 09/12/16 11:18 AM Phone: [phone] Fax: [phone]

## 2016-09-12 NOTE — Patient Instructions (Signed)
ADDUCTION: Side-Lying (Active)    Lie on right side, with top leg bent and in front of other leg. Lift straight leg up as high as possible.  Complete __10_ sets of __2_ repetitions. Perform __2_ sessions per day.  http://gtsc.exer.us/128   Copyright  VHI. All rights reserved.

## 2016-09-13 ENCOUNTER — Ambulatory Visit (INDEPENDENT_AMBULATORY_CARE_PROVIDER_SITE_OTHER): Payer: Self-pay

## 2016-09-13 ENCOUNTER — Encounter (INDEPENDENT_AMBULATORY_CARE_PROVIDER_SITE_OTHER): Payer: Self-pay | Admitting: Specialist

## 2016-09-13 ENCOUNTER — Ambulatory Visit (INDEPENDENT_AMBULATORY_CARE_PROVIDER_SITE_OTHER): Payer: Medicare Other | Admitting: Specialist

## 2016-09-13 VITALS — BP 135/82 | HR 103 | Ht 68.0 in | Wt 230.0 lb

## 2016-09-13 DIAGNOSIS — Z96652 Presence of left artificial knee joint: Secondary | ICD-10-CM

## 2016-09-13 NOTE — Patient Instructions (Addendum)
Plan: Doing well, continue with ROM, need to work on full extension. Use Ice post exercise, work on extension as much as flexion.  PT until maximized extension and flexion. Consider dynamic splinting if loss of extension persists.

## 2016-09-13 NOTE — Progress Notes (Addendum)
Post-Op Visit Note   Patient: Jesus Brown           Date of Birth: [birth year]           MRN: [id number] Visit Date: 09/13/2016 PCP: Merrilee Seashore, MD   Assessment & Plan: 7 weeks post Left TKR, no sign of infection, swelling, skin are improving, needs work on extension.  Chief Complaint:  Chief Complaint  Patient presents with  . Left Knee - Routine Post Op    Total Left Knee arthroplasty 07/19/16   Visit Diagnoses:  1. Status post total knee replacement using cement, left   Incision is healing well, mild warm but swelling vastly improved since last visit.  ROM -5-10 degrees to 125 flexion.  He is full weight bearing with much less antalgia in gait.   Plan: Doing well, continue with ROM, need to work on full extension. Use Ice post exercise, work on extension as much as flexion.  PT until maximized extension and flexion. Consider dynamic splinting if loss of extension persists.  Follow-Up Instructions: Return in about 5 weeks (around 10/18/2016).   Orders:  No orders of the defined types were placed in this encounter.  No orders of the defined types were placed in this encounter.   Imaging: No results found.  PMFS History: Patient Active Problem List   Diagnosis Date Noted  . Bilateral primary osteoarthritis of knee 07/19/2016    Priority: High    Class: Chronic  . Cervical spondylosis without myelopathy 03/25/2014    Priority: High    Class: Chronic  . Left cervical radiculopathy 03/25/2014    Priority: High    Class: Chronic  . Presence of left artificial knee joint 07/25/2016  . Pain and swelling of left lower leg 07/25/2016  . Primary localized osteoarthritis of left knee 07/19/2016  . 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 Hypotestosteronemia 09/08/2015  . Cervical spondylosis with radiculopathy 03/25/2014   Past Medical History:  Diagnosis Date  . Anxiety    mainly r/t pain  . Arthritis    neck, knees  . Asthma    AS CHILD     . Back pain, chronic   . DDD (degenerative disc disease)   . Dysphagia   . 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 GERD (gastroesophageal reflux disease)   . Hypertension   . Hypotestosteronemia 09/08/2015  . Neck pain, chronic     No family history on file.  Past Surgical History:  Procedure Laterality Date  . ANKLE SURGERY    . BACK SURGERY  2010,2011   lumb fusionx2  . CERVICAL FUSION  7829,5621   3 surgeries  . DIRECT LARYNGOSCOPY N/A 11/09/2012   Procedure: DIRECT LARYNGOSCOPY;  Surgeon: Jodi Marble, MD;  Location: [place];  Service: ENT;  Laterality: N/A;  . ESOPHAGOGASTRODUODENOSCOPY N/A 08/03/2012   Procedure: ESOPHAGOGASTRODUODENOSCOPY (EGD);  Surgeon: Arta Silence, MD;  Location: [facility] ENDOSCOPY;  Service: Endoscopy;  Laterality: N/A;  . ESOPHAGOSCOPY W/ BOTOX INJECTION N/A 11/09/2012   Procedure: ESOPHAGOSCOPY POSSIBLE BIOPSY AND POSSIBLE BOTOX INJECTION CRICOPHARYNGEUS;  Surgeon: Jodi Marble, MD;  Location: [place];  Service: ENT;  Laterality: N/A;  Esophagoscopy with botox injection  . HERNIA REPAIR     ing-rt  . KNEE SURGERY     rt and left x2 each=4  . POSTERIOR CERVICAL FUSION/FORAMINOTOMY N/A 03/25/2014   Procedure: LEFT C7-T1 FORAMINOTOMY;  Surgeon: Jessy Oto, MD;  Location: [place];  Service: Orthopedics;  Laterality:  N/A;  . [place]   rt and lt  . TONSILLECTOMY    . TOTAL KNEE ARTHROPLASTY Left 07/19/2016   Procedure: LEFT TOTAL KNEE ARTHROPLASTY CORTISON INJECTION IN RIGHT KNEE;  Surgeon: Jessy Oto, MD;  Location: [place];  Service: Orthopedics;  Laterality: Left;  MAY NEED RNFA TO FINISH CASE PER [place]   . WRIST SURGERY     lt and rt   Social History   Occupational History  . Not on file.   Social History Main Topics  . Smoking status: Never Smoker  . Smokeless tobacco: Never Used  . Alcohol use No  . Drug use: No  . Sexual activity: Not on file

## 2016-09-18 ENCOUNTER — Encounter: Payer: BLUE CROSS/BLUE SHIELD | Admitting: Physical Therapy

## 2016-09-20 ENCOUNTER — Ambulatory Visit: Payer: BLUE CROSS/BLUE SHIELD | Admitting: Physical Therapy

## 2016-09-20 DIAGNOSIS — M25662 Stiffness of left knee, not elsewhere classified: Secondary | ICD-10-CM

## 2016-09-20 DIAGNOSIS — M25562 Pain in left knee: Secondary | ICD-10-CM

## 2016-09-20 DIAGNOSIS — R262 Difficulty in walking, not elsewhere classified: Secondary | ICD-10-CM

## 2016-09-20 DIAGNOSIS — R600 Localized edema: Secondary | ICD-10-CM

## 2016-09-20 NOTE — Therapy (Signed)
[place], [place], [address] Phone: [phone]   Fax:  [phone]  Physical Therapy Treatment  Patient Details  Name: Jesus Brown MRN: [id number] Date of Birth: [birth year] Referring Provider: Dr. Basil Dess  Encounter Date: 09/20/2016      PT End of Session - 09/20/16 0818    Visit Number 12   Number of Visits 24   Date for PT Re-Evaluation 10/01/16   PT Start Time 0801   PT Stop Time 0900   PT Time Calculation (min) 59 min   Activity Tolerance Patient tolerated treatment well   Behavior During Therapy [facility] for tasks assessed/performed      Past Medical History:  Diagnosis Date  . Anxiety    mainly r/t pain  . Arthritis    neck, knees  . Asthma    AS CHILD   . Back pain, chronic   . DDD (degenerative disc disease)   . Dysphagia   . 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 GERD (gastroesophageal reflux disease)   . Hypertension   . Hypotestosteronemia 09/08/2015  . Neck pain, chronic     Past Surgical History:  Procedure Laterality Date  . ANKLE SURGERY    . BACK SURGERY  2010,2011   lumb fusionx2  . CERVICAL FUSION  2947,6546   3 surgeries  . DIRECT LARYNGOSCOPY N/A 11/09/2012   Procedure: DIRECT LARYNGOSCOPY;  Surgeon: Jodi Marble, MD;  Location: [place];  Service: ENT;  Laterality: N/A;  . ESOPHAGOGASTRODUODENOSCOPY N/A 08/03/2012   Procedure: ESOPHAGOGASTRODUODENOSCOPY (EGD);  Surgeon: Arta Silence, MD;  Location: [facility] ENDOSCOPY;  Service: Endoscopy;  Laterality: N/A;  . ESOPHAGOSCOPY W/ BOTOX INJECTION N/A 11/09/2012   Procedure: ESOPHAGOSCOPY POSSIBLE BIOPSY AND POSSIBLE BOTOX INJECTION CRICOPHARYNGEUS;  Surgeon: Jodi Marble, MD;  Location: [place];  Service: ENT;  Laterality: N/A;  Esophagoscopy with botox injection  . HERNIA REPAIR     ing-rt  . KNEE SURGERY     rt and left x2 each=4  . POSTERIOR CERVICAL FUSION/FORAMINOTOMY  N/A 03/25/2014   Procedure: LEFT C7-T1 FORAMINOTOMY;  Surgeon: Jessy Oto, MD;  Location: [place];  Service: Orthopedics;  Laterality: N/A;  . [place]   rt and lt  . TONSILLECTOMY    . TOTAL KNEE ARTHROPLASTY Left 07/19/2016   Procedure: LEFT TOTAL KNEE ARTHROPLASTY CORTISON INJECTION IN RIGHT KNEE;  Surgeon: Jessy Oto, MD;  Location: [place];  Service: Orthopedics;  Laterality: Left;  MAY NEED RNFA TO FINISH CASE PER [place]   . WRIST SURGERY     lt and rt    There were no vitals filed for this visit.      Subjective Assessment - 09/20/16 0807    Subjective Knee hurts today, kept me up all night, happened when I was going up stairs.  MD concerned about my extension.  He said I may need a brace to wear.    Currently in Pain? Yes   Pain Score 6    Pain Location Knee   Pain Orientation Left   Pain Descriptors / Indicators Aching;Tightness   Pain Onset More than a month ago   Pain Frequency Intermittent              [facility] Adult PT Treatment/Exercise - 09/20/16 0001      Knee/Hip Exercises: Stretches   Other Knee/Hip Stretches knee to chest L x 30 sec x 3      Knee/Hip  Exercises: Aerobic   Recumbent Bike 6 min Level 3      Knee/Hip Exercises: Standing   Terminal Knee Extension Limitations x 20 ball into wall      Knee/Hip Exercises: Supine   Bridges with Diona Foley Squeeze Strengthening;Both;1 set;10 reps   Straight Leg Raises Strengthening;Left;1 set;15 reps   Straight Leg Raise with External Rotation Strengthening;Left;1 set;15 reps   Knee Extension AAROM;Left;1 set     Knee/Hip Exercises: Prone   Hip Extension Strengthening;Left;2 sets;10 reps   Straight Leg Raises Limitations prone quad set x 10      Cryotherapy   Cryotherapy Location Knee     Electrical Stimulation   Electrical Stimulation Location L knee    Electrical Stimulation Goals Edema;Pain     Manual Therapy   Joint Mobilization GRII-III extension mobs    Passive ROM extension                   PT Short Term Goals - 09/09/16 1025      PT SHORT TERM GOAL #1   Title Pt will be I with initial HEP for ROM and strength in L. knee    Status Achieved     PT SHORT TERM GOAL #2   Title Pt will be able to go without cane in his home and not aggravate back pain most of the time.    Status Achieved     PT SHORT TERM GOAL #3   Title Pt will achieve no more than 8 deg knee extension for improved gait, flexibility.    Status On-going           PT Long Term Goals - 09/20/16 0931      PT LONG TERM GOAL #1   Title Pt will understand RICE, posture and body mechanics as it pertains to knee and back pain.    Status On-going     PT LONG TERM GOAL #2   Title Pt will be I with more advanced HEP for knee, core.    Status On-going     PT LONG TERM GOAL #3   Title Pt will less than 55% limited on FOTO to demo functional improvement   Status On-going     PT LONG TERM GOAL #4   Title Pt will be able to go outside and participate in family functions without limitations of knee pain.    Baseline pain increases    Status Partially Met     PT LONG TERM GOAL #5   Title Pt will be able to walk for 30 min for general health and fitness without lasting knee pain    Status On-going               Plan - 09/20/16 0818    Clinical Impression Statement Pt continues to have pain aggravated by step ups. Stretches daily and uses heat to help his extension.  MD may want him to get a brace.     PT Next Visit Plan advance HEP  to include standing wall .  progress ROM and strength as tol, NuStep, manual in ext  Slowly with stairs    PT Home Exercise Plan has HHPT level 1-2 ex, given assisted knee flexion, ITB stretch and hamstring as well today. , SLR with ER and hip adduction.    Consulted and Agree with Plan of Care Patient      Patient will benefit from skilled therapeutic intervention in order to improve the following deficits and impairments:  Difficulty  walking,  Increased fascial restricitons, Pain, Hypomobility, Decreased scar mobility, Impaired flexibility, Increased edema, Decreased strength, Decreased mobility  Visit Diagnosis: Acute pain of left knee  Stiffness of left knee, not elsewhere classified  Localized edema  Difficulty in walking, not elsewhere classified     Problem List Patient Active Problem List   Diagnosis Date Noted  . Presence of left artificial knee joint 07/25/2016  . Pain and swelling of left lower leg 07/25/2016  . Bilateral primary osteoarthritis of knee 07/19/2016    Class: Chronic  . Primary localized osteoarthritis of left knee 07/19/2016  . 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 Hypotestosteronemia 09/08/2015  . Cervical spondylosis without myelopathy 03/25/2014    Class: Chronic  . Left cervical radiculopathy 03/25/2014    Class: Chronic  . Cervical spondylosis with radiculopathy 03/25/2014    Cordella Nyquist 09/20/2016, 12:00 PM  [place], [place], [address] Phone: [phone]   Fax:  [phone]  Name: Jesus Brown MRN: [id number] Date of Birth: 03-24-1966  Raeford Razor, PT 09/20/16 12:01 PM Phone: [phone] Fax: [phone]

## 2016-09-26 ENCOUNTER — Ambulatory Visit (INDEPENDENT_AMBULATORY_CARE_PROVIDER_SITE_OTHER): Payer: Medicare Other | Admitting: Specialist

## 2016-09-26 ENCOUNTER — Encounter (INDEPENDENT_AMBULATORY_CARE_PROVIDER_SITE_OTHER): Payer: Self-pay | Admitting: Specialist

## 2016-09-26 VITALS — BP 131/81 | HR 98 | Ht 68.0 in | Wt 230.0 lb

## 2016-09-26 DIAGNOSIS — Z96652 Presence of left artificial knee joint: Secondary | ICD-10-CM | POA: Insufficient documentation

## 2016-09-26 DIAGNOSIS — M24662 Ankylosis, left knee: Secondary | ICD-10-CM

## 2016-09-26 NOTE — Progress Notes (Signed)
Post-Op Visit Note   Patient: Jesus Brown           Date of Birth: Nov 27, 1965           MRN: [id number] Visit Date: 09/26/2016 PCP: Merrilee Seashore, MD   Assessment & Plan:  Chief Complaint:  Chief Complaint  Patient presents with  . Left Knee - Routine Post Op    Patient returns for recheck. Continue to work hard with physical therapy. Knee flexion is good but he still continues to be bothered with not being able to reach full extension. Has about an 8 extension lag. Also continues to have some soreness in the knee. Visit Diagnoses:  1. Status post total left knee replacement     Plan: For postop arthrofibrosis which is limiting patient from getting to full extension we will order a JAS splint.  Not being able to reach full extension is causing some gait difficulty. Patient will follow up in 3 weeks for recheck and hopefully he has a splint by that time. Continue PT.  Follow-Up Instructions: Return in about 3 weeks (around 10/17/2016).   Orders:  No orders of the defined types were placed in this encounter.  No orders of the defined types were placed in this encounter.   Imaging: No results found.  PMFS History: Patient Active Problem List   Diagnosis Date Noted  . Status post total left knee replacement 09/26/2016  . Presence of left artificial knee joint 07/25/2016  . Pain and swelling of left lower leg 07/25/2016  . Bilateral primary osteoarthritis of knee 07/19/2016    Class: Chronic  . Primary localized osteoarthritis of left knee 07/19/2016  . 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 Hypotestosteronemia 09/08/2015  . Cervical spondylosis without myelopathy 03/25/2014    Class: Chronic  . Left cervical radiculopathy 03/25/2014    Class: Chronic  . Cervical spondylosis with radiculopathy 03/25/2014   Past Medical History:  Diagnosis Date  . Anxiety    mainly r/t pain  . Arthritis    neck, knees  . Asthma    AS CHILD   . Back pain,  chronic   . DDD (degenerative disc disease)   . Dysphagia   . 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 GERD (gastroesophageal reflux disease)   . Hypertension   . Hypotestosteronemia 09/08/2015  . Neck pain, chronic     No family history on file.  Past Surgical History:  Procedure Laterality Date  . ANKLE SURGERY    . BACK SURGERY  2010,2011   lumb fusionx2  . CERVICAL FUSION  8850,2774   3 surgeries  . DIRECT LARYNGOSCOPY N/A 11/09/2012   Procedure: DIRECT LARYNGOSCOPY;  Surgeon: Jodi Marble, MD;  Location: [place];  Service: ENT;  Laterality: N/A;  . ESOPHAGOGASTRODUODENOSCOPY N/A 08/03/2012   Procedure: ESOPHAGOGASTRODUODENOSCOPY (EGD);  Surgeon: Arta Silence, MD;  Location: [facility] ENDOSCOPY;  Service: Endoscopy;  Laterality: N/A;  . ESOPHAGOSCOPY W/ BOTOX INJECTION N/A 11/09/2012   Procedure: ESOPHAGOSCOPY POSSIBLE BIOPSY AND POSSIBLE BOTOX INJECTION CRICOPHARYNGEUS;  Surgeon: Jodi Marble, MD;  Location: [place];  Service: ENT;  Laterality: N/A;  Esophagoscopy with botox injection  . HERNIA REPAIR     ing-rt  . KNEE SURGERY     rt and left x2 each=4  . POSTERIOR CERVICAL FUSION/FORAMINOTOMY N/A 03/25/2014   Procedure: LEFT C7-T1 FORAMINOTOMY;  Surgeon: Jessy Oto, MD;  Location: [place];  Service: Orthopedics;  Laterality: N/A;  . SHOULDER SURGERY  1999   rt and lt  . TONSILLECTOMY    . TOTAL KNEE ARTHROPLASTY Left 07/19/2016   Procedure: LEFT TOTAL KNEE ARTHROPLASTY CORTISON INJECTION IN RIGHT KNEE;  Surgeon: Jessy Oto, MD;  Location: [place];  Service: Orthopedics;  Laterality: Left;  MAY NEED RNFA TO FINISH CASE PER [place]   . WRIST SURGERY     lt and rt   Social History   Occupational History  . Not on file.   Social History Main Topics  . Smoking status: Never Smoker  . Smokeless tobacco: Never Used  . Alcohol use No  . Drug use: No  . Sexual activity: Not on file     Exam Gait is antalgic. Knee range of  motion about 8-110+ degrees. He is somewhat tender around the suprapatellar pouch. Some swelling although not extreme. No signs of infection. Calf nontender. Neurovascularly intact.

## 2016-09-30 ENCOUNTER — Ambulatory Visit: Payer: BLUE CROSS/BLUE SHIELD | Admitting: Physical Therapy

## 2016-09-30 DIAGNOSIS — R600 Localized edema: Secondary | ICD-10-CM

## 2016-09-30 DIAGNOSIS — M25662 Stiffness of left knee, not elsewhere classified: Secondary | ICD-10-CM

## 2016-09-30 DIAGNOSIS — M25562 Pain in left knee: Secondary | ICD-10-CM | POA: Diagnosis not present

## 2016-09-30 DIAGNOSIS — R262 Difficulty in walking, not elsewhere classified: Secondary | ICD-10-CM

## 2016-09-30 NOTE — Therapy (Signed)
[place], [place], [address] Phone: [phone]   Fax:  [phone]  Physical Therapy Treatment/Renewal   Patient Details  Name: Jesus Brown MRN: [id number] Date of Birth: February 24, 1966 Referring Provider: Dr. Basil Dess  Encounter Date: 09/30/2016      PT End of Session - 09/30/16 0913    Visit Number 13   Number of Visits 22   Date for PT Re-Evaluation 11/01/16   PT Start Time 0900   PT Stop Time 0935   PT Time Calculation (min) 35 min   Activity Tolerance Patient tolerated treatment well   Behavior During Therapy [facility] for tasks assessed/performed      Past Medical History:  Diagnosis Date  . Anxiety    mainly r/t pain  . Arthritis    neck, knees  . Asthma    AS CHILD   . Back pain, chronic   . DDD (degenerative disc disease)   . Dysphagia   . 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 GERD (gastroesophageal reflux disease)   . Hypertension   . Hypotestosteronemia 09/08/2015  . Neck pain, chronic     Past Surgical History:  Procedure Laterality Date  . ANKLE SURGERY    . BACK SURGERY  2010,2011   lumb fusionx2  . CERVICAL FUSION  0165,5374   3 surgeries  . DIRECT LARYNGOSCOPY N/A 11/09/2012   Procedure: DIRECT LARYNGOSCOPY;  Surgeon: Jodi Marble, MD;  Location: [place];  Service: ENT;  Laterality: N/A;  . ESOPHAGOGASTRODUODENOSCOPY N/A 08/03/2012   Procedure: ESOPHAGOGASTRODUODENOSCOPY (EGD);  Surgeon: Arta Silence, MD;  Location: [facility] ENDOSCOPY;  Service: Endoscopy;  Laterality: N/A;  . ESOPHAGOSCOPY W/ BOTOX INJECTION N/A 11/09/2012   Procedure: ESOPHAGOSCOPY POSSIBLE BIOPSY AND POSSIBLE BOTOX INJECTION CRICOPHARYNGEUS;  Surgeon: Jodi Marble, MD;  Location: [place];  Service: ENT;  Laterality: N/A;  Esophagoscopy with botox injection  . HERNIA REPAIR     ing-rt  . KNEE SURGERY     rt and left x2 each=4  . POSTERIOR CERVICAL  FUSION/FORAMINOTOMY N/A 03/25/2014   Procedure: LEFT C7-T1 FORAMINOTOMY;  Surgeon: Jessy Oto, MD;  Location: [place];  Service: Orthopedics;  Laterality: N/A;  . [place]   rt and lt  . TONSILLECTOMY    . TOTAL KNEE ARTHROPLASTY Left 07/19/2016   Procedure: LEFT TOTAL KNEE ARTHROPLASTY CORTISON INJECTION IN RIGHT KNEE;  Surgeon: Jessy Oto, MD;  Location: [place];  Service: Orthopedics;  Laterality: Left;  MAY NEED RNFA TO FINISH CASE PER [place]   . WRIST SURGERY     lt and rt    There were no vitals filed for this visit.      Subjective Assessment - 09/30/16 0904    Subjective Knee 5/10 today.  Pt arr 15 min late.  Pt will be contacted by  JAS rep for brace.  I think I can get it to extend more when I'm in the bath.    Currently in Pain? Yes   Pain Score 5    Pain Location Knee   Pain Orientation Left   Pain Descriptors / Indicators Aching;Sharp   Pain Type Chronic pain;Surgical pain   Pain Onset More than a month ago   Pain Frequency Intermittent   Aggravating Factors  standing, walking, bending    Pain Relieving Factors ice, propping, rest             [facility] PT Assessment - 09/30/16 0001  AROM   Left Knee Extension 9   Left Knee Flexion 115     Strength   Left Hip Flexion 3+/5   Left Hip ABduction 4-/5   Left Knee Flexion 4-/5   Left Knee Extension 4/5            [facility] Adult PT Treatment/Exercise - 09/30/16 0001      Knee/Hip Exercises: Stretches   Active Hamstring Stretch Left;3 reps   ITB Stretch Left;3 reps;30 seconds     Knee/Hip Exercises: Aerobic   Recumbent Bike 6 min Level 3      Knee/Hip Exercises: Supine   Quad Sets Strengthening;Left;1 set   Quad Sets Limitations 10 sec hold with  tactile    Straight Leg Raises Strengthening;Left;1 set;10 reps   Straight Leg Raises Limitations 4 lbs    Straight Leg Raise with External Rotation Strengthening;Left;1 set;15 reps   Straight Leg Raise with External Rotation Limitations 4  lbs      Knee/Hip Exercises: Sidelying   Hip ABduction Strengthening;Left;1 set;10 reps     Manual Therapy   Manual therapy comments used heat post knee during manual    Joint Mobilization GRII-III extension mobs    Passive ROM extension                PT Education - 09/30/16 0913    Education provided Yes   Education Details JAS , protocol   Person(s) Educated Patient   Methods Explanation   Comprehension Verbalized understanding          PT Short Term Goals - 09/30/16 0914      PT SHORT TERM GOAL #1   Title Pt will be I with initial HEP for ROM and strength in L. knee    Status Achieved     PT SHORT TERM GOAL #2   Title Pt will be able to go without cane in his home and not aggravate back pain most of the time.    Status Achieved     PT SHORT TERM GOAL #3   Title Pt will achieve no more than 8 deg knee extension for improved gait, flexibility.    Status On-going           PT Long Term Goals - 09/30/16 0916      PT LONG TERM GOAL #1   Title Pt will understand RICE, posture and body mechanics as it pertains to knee and back pain.    Status On-going     PT LONG TERM GOAL #2   Title Pt will be I with more advanced HEP for knee, core.    Status On-going     PT LONG TERM GOAL #3   Title Pt will less than 55% limited on FOTO to demo functional improvement   Baseline 68%    Status On-going     PT LONG TERM GOAL #4   Title Pt will be able to go outside and participate in family functions without limitations of knee pain, including walking on uneven ground.    Baseline improved , gets flashes of pain in Lt thigh when on uneven ground.    Status Partially Met     PT LONG TERM GOAL #5   Title Pt will be able to walk for 30 min for general health and fitness without lasting knee pain    Baseline <15 min being on it at all , Lt leg hurts, back begins to hurt and buttocks.    Status On-going  Plan - 05-Oct-2016 0913    Clinical  Impression Statement Renewed today for 1 more month of PT to maximize ROM, strength and improve gait. FOTO score remains 68%.    PT Next Visit Plan advance HEP  to include standing wall .  progress ROM and strength as to, manual in ext  Slowly with stairs    PT Home Exercise Plan has HHPT level 1-2 ex, given assisted knee flexion, ITB stretch and hamstring as well today. , SLR with ER and hip adduction.    Consulted and Agree with Plan of Care Patient      Patient will benefit from skilled therapeutic intervention in order to improve the following deficits and impairments:  Difficulty walking, Increased fascial restricitons, Pain, Hypomobility, Decreased scar mobility, Impaired flexibility, Increased edema, Decreased strength, Decreased mobility  Visit Diagnosis: Acute pain of left knee  Stiffness of left knee, not elsewhere classified  Localized edema  Difficulty in walking, not elsewhere classified       G-Codes - 2016/10/05 1042    Functional Assessment Tool Used (Outpatient Only) FOTO   Functional Limitation Mobility: Walking and moving around   Mobility: Walking and Moving Around Current Status [phone]) At least 60 percent but less than 80 percent impaired, limited or restricted   Mobility: Walking and Moving Around Goal Status [phone]) At least 40 percent but less than 60 percent impaired, limited or restricted      Problem List Patient Active Problem List   Diagnosis Date Noted  . Status post total left knee replacement 09/26/2016  . Presence of left artificial knee joint 07/25/2016  . Pain and swelling of left lower leg 07/25/2016  . Bilateral primary osteoarthritis of knee 07/19/2016    Class: Chronic  . Primary localized osteoarthritis of left knee 07/19/2016  . 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 Hypotestosteronemia 09/08/2015  . Cervical spondylosis without myelopathy 03/25/2014    Class: Chronic  . Left cervical radiculopathy 03/25/2014     Class: Chronic  . Cervical spondylosis with radiculopathy 03/25/2014    Jatinder Mcdonagh Oct 05, 2016, 10:42 AM  [place], [place], [address] Phone: [phone]   Fax:  [phone]  Name: Jesus Brown MRN: [id number] Date of Birth: July 04, 1965  Raeford Razor, PT 05-Oct-2016 10:42 AM Phone: [phone] Fax: [phone]

## 2016-10-03 ENCOUNTER — Ambulatory Visit: Payer: BLUE CROSS/BLUE SHIELD | Admitting: Physical Therapy

## 2016-10-03 DIAGNOSIS — M25562 Pain in left knee: Secondary | ICD-10-CM | POA: Diagnosis not present

## 2016-10-03 DIAGNOSIS — M25662 Stiffness of left knee, not elsewhere classified: Secondary | ICD-10-CM

## 2016-10-03 DIAGNOSIS — R262 Difficulty in walking, not elsewhere classified: Secondary | ICD-10-CM

## 2016-10-03 DIAGNOSIS — R600 Localized edema: Secondary | ICD-10-CM

## 2016-10-03 NOTE — Therapy (Signed)
[place], [place], [address] Phone: [phone]   Fax:  [phone]  Physical Therapy Treatment  Patient Details  Name: Jesus Brown MRN: [id number] Date of Birth: 05/12/1965 Referring Provider: Dr. Basil Dess  Encounter Date: 10/03/2016      PT End of Session - 10/03/16 1451    Visit Number 14   Number of Visits 22   Date for PT Re-Evaluation 11/01/16   PT Start Time 1148   PT Stop Time 1232   PT Time Calculation (min) 44 min   Activity Tolerance Patient tolerated treatment well   Behavior During Therapy [facility] for tasks assessed/performed      Past Medical History:  Diagnosis Date  . Anxiety    mainly r/t pain  . Arthritis    neck, knees  . Asthma    AS CHILD   . Back pain, chronic   . DDD (degenerative disc disease)   . Dysphagia   . 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 GERD (gastroesophageal reflux disease)   . Hypertension   . Hypotestosteronemia 09/08/2015  . Neck pain, chronic     Past Surgical History:  Procedure Laterality Date  . ANKLE SURGERY    . BACK SURGERY  2010,2011   lumb fusionx2  . CERVICAL FUSION  2202,5427   3 surgeries  . DIRECT LARYNGOSCOPY N/A 11/09/2012   Procedure: DIRECT LARYNGOSCOPY;  Surgeon: Jodi Marble, MD;  Location: [place];  Service: ENT;  Laterality: N/A;  . ESOPHAGOGASTRODUODENOSCOPY N/A 08/03/2012   Procedure: ESOPHAGOGASTRODUODENOSCOPY (EGD);  Surgeon: Arta Silence, MD;  Location: [facility] ENDOSCOPY;  Service: Endoscopy;  Laterality: N/A;  . ESOPHAGOSCOPY W/ BOTOX INJECTION N/A 11/09/2012   Procedure: ESOPHAGOSCOPY POSSIBLE BIOPSY AND POSSIBLE BOTOX INJECTION CRICOPHARYNGEUS;  Surgeon: Jodi Marble, MD;  Location: [place];  Service: ENT;  Laterality: N/A;  Esophagoscopy with botox injection  . HERNIA REPAIR     ing-rt  . KNEE SURGERY     rt and left x2 each=4  . POSTERIOR CERVICAL FUSION/FORAMINOTOMY  N/A 03/25/2014   Procedure: LEFT C7-T1 FORAMINOTOMY;  Surgeon: Jessy Oto, MD;  Location: [place];  Service: Orthopedics;  Laterality: N/A;  . [place]   rt and lt  . TONSILLECTOMY    . TOTAL KNEE ARTHROPLASTY Left 07/19/2016   Procedure: LEFT TOTAL KNEE ARTHROPLASTY CORTISON INJECTION IN RIGHT KNEE;  Surgeon: Jessy Oto, MD;  Location: [place];  Service: Orthopedics;  Laterality: Left;  MAY NEED RNFA TO FINISH CASE PER [place]   . WRIST SURGERY     lt and rt    There were no vitals filed for this visit.      Subjective Assessment - 10/03/16 1156    Subjective 4/10 in knee, back is OK.  Still has not arranged JAS measurement and fitting. Playing phone tag. the thing that helps the most is hanging something on my thigh and letting it stretch.    Currently in Pain? Yes   Pain Score 4             [facility] PT Assessment - 10/03/16 0001      AROM   Left Knee Extension 8   Left Knee Flexion 120            [facility] Adult PT Treatment/Exercise - 10/03/16 0001      Knee/Hip Exercises: Stretches   Active Hamstring Stretch Left;3 reps   Knee: Self-Stretch to increase Flexion Left;3  reps   Knee: Self-Stretch Limitations pronlonged with cuff weight thigh      Knee/Hip Exercises: Seated   Long Arc Quad Strengthening;10 reps;20 reps;Weights   Long Arc Quad Weight 6 lbs.   Long [employer] Limitations 10 second hold and release     Manual Therapy   Manual Therapy Passive ROM;Muscle Energy Technique   Joint Mobilization GRII-III extension mobs   patellar mobs , used mobilization belt    Passive ROM flexion/extension    Muscle Energy Technique contract relax for knee ext                 PT Education - 10/03/16 1450    Education provided No          PT Short Term Goals - 09/30/16 0914      PT SHORT TERM GOAL #1   Title Pt will be I with initial HEP for ROM and strength in L. knee    Status Achieved     PT SHORT TERM GOAL #2   Title Pt will be able to  go without cane in his home and not aggravate back pain most of the time.    Status Achieved     PT SHORT TERM GOAL #3   Title Pt will achieve no more than 8 deg knee extension for improved gait, flexibility.    Status On-going           PT Long Term Goals - 09/30/16 0916      PT LONG TERM GOAL #1   Title Pt will understand RICE, posture and body mechanics as it pertains to knee and back pain.    Status On-going     PT LONG TERM GOAL #2   Title Pt will be I with more advanced HEP for knee, core.    Status On-going     PT LONG TERM GOAL #3   Title Pt will less than 55% limited on FOTO to demo functional improvement   Baseline 68%    Status On-going     PT LONG TERM GOAL #4   Title Pt will be able to go outside and participate in family functions without limitations of knee pain, including walking on uneven ground.    Baseline improved , gets flashes of pain in Lt thigh when on uneven ground.    Status Partially Met     PT LONG TERM GOAL #5   Title Pt will be able to walk for 30 min for general health and fitness without lasting knee pain    Baseline <15 min being on it at all , Lt leg hurts, back begins to hurt and buttocks.    Status On-going               Plan - 10/03/16 1451    Clinical Impression Statement Worked heavily today on manual to Lt. knee, with foot propped, able to get to 8 deg.  Declines ice post.  Knows how to stretch at home with a weight or other foot pressed onto thigh.  Hopefully he will talk to the JAS rep today to arrange fitting.    PT Next Visit Plan extension stretching, manual    PT Home Exercise Plan has HHPT level 1-2 ex, given assisted knee flexion, ITB stretch and hamstring as well today. , SLR with ER and hip adduction.    Consulted and Agree with Plan of Care Patient      Patient will benefit from skilled therapeutic intervention in order  to improve the following deficits and impairments:  Difficulty walking, Increased fascial  restricitons, Pain, Hypomobility, Decreased scar mobility, Impaired flexibility, Increased edema, Decreased strength, Decreased mobility  Visit Diagnosis: Acute pain of left knee  Stiffness of left knee, not elsewhere classified  Localized edema  Difficulty in walking, not elsewhere classified     Problem List Patient Active Problem List   Diagnosis Date Noted  . Status post total left knee replacement 09/26/2016  . Presence of left artificial knee joint 07/25/2016  . Pain and swelling of left lower leg 07/25/2016  . Bilateral primary osteoarthritis of knee 07/19/2016    Class: Chronic  . Primary localized osteoarthritis of left knee 07/19/2016  . 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 Hypotestosteronemia 09/08/2015  . Cervical spondylosis without myelopathy 03/25/2014    Class: Chronic  . Left cervical radiculopathy 03/25/2014    Class: Chronic  . Cervical spondylosis with radiculopathy 03/25/2014    PAA,JENNIFER 10/03/2016, 2:54 PM  [place], [place], [address] Phone: [phone]   Fax:  [phone]  Name: Jesus Brown MRN: [id number] Date of Birth: Oct 24, 1965  Raeford Razor, PT 10/03/16 2:56 PM Phone: [phone] Fax: [phone]

## 2016-10-07 ENCOUNTER — Ambulatory Visit: Payer: BLUE CROSS/BLUE SHIELD | Attending: Specialist | Admitting: Physical Therapy

## 2016-10-07 ENCOUNTER — Encounter: Payer: Self-pay | Admitting: Physical Therapy

## 2016-10-07 DIAGNOSIS — R262 Difficulty in walking, not elsewhere classified: Secondary | ICD-10-CM | POA: Diagnosis present

## 2016-10-07 DIAGNOSIS — R600 Localized edema: Secondary | ICD-10-CM | POA: Insufficient documentation

## 2016-10-07 DIAGNOSIS — M25662 Stiffness of left knee, not elsewhere classified: Secondary | ICD-10-CM | POA: Insufficient documentation

## 2016-10-07 DIAGNOSIS — M25562 Pain in left knee: Secondary | ICD-10-CM | POA: Insufficient documentation

## 2016-10-07 NOTE — Therapy (Signed)
[place], [place], [address] Phone: [phone]   Fax:  [phone]  Physical Therapy Treatment  Patient Details  Name: Jesus Brown MRN: [id number] Date of Birth: [birth year] Referring Provider: Dr. Basil Dess  Encounter Date: 10/07/2016      PT End of Session - 10/07/16 1023    Visit Number 15   Number of Visits 22   Date for PT Re-Evaluation 11/01/16   PT Start Time 1017   PT Stop Time 1100   PT Time Calculation (min) 43 min   Activity Tolerance Patient tolerated treatment well   Behavior During Therapy [facility] for tasks assessed/performed      Past Medical History:  Diagnosis Date  . Anxiety    mainly r/t pain  . Arthritis    neck, knees  . Asthma    AS CHILD   . Back pain, chronic   . DDD (degenerative disc disease)   . Dysphagia   . 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 GERD (gastroesophageal reflux disease)   . Hypertension   . Hypotestosteronemia 09/08/2015  . Neck pain, chronic     Past Surgical History:  Procedure Laterality Date  . ANKLE SURGERY    . BACK SURGERY  2010,2011   lumb fusionx2  . CERVICAL FUSION  6606,3016   3 surgeries  . DIRECT LARYNGOSCOPY N/A 11/09/2012   Procedure: DIRECT LARYNGOSCOPY;  Surgeon: Jodi Marble, MD;  Location: [place];  Service: ENT;  Laterality: N/A;  . ESOPHAGOGASTRODUODENOSCOPY N/A 08/03/2012   Procedure: ESOPHAGOGASTRODUODENOSCOPY (EGD);  Surgeon: Arta Silence, MD;  Location: [facility] ENDOSCOPY;  Service: Endoscopy;  Laterality: N/A;  . ESOPHAGOSCOPY W/ BOTOX INJECTION N/A 11/09/2012   Procedure: ESOPHAGOSCOPY POSSIBLE BIOPSY AND POSSIBLE BOTOX INJECTION CRICOPHARYNGEUS;  Surgeon: Jodi Marble, MD;  Location: [place];  Service: ENT;  Laterality: N/A;  Esophagoscopy with botox injection  . HERNIA REPAIR     ing-rt  . KNEE SURGERY     rt and left x2 each=4  . POSTERIOR CERVICAL FUSION/FORAMINOTOMY  N/A 03/25/2014   Procedure: LEFT C7-T1 FORAMINOTOMY;  Surgeon: Jessy Oto, MD;  Location: [place];  Service: Orthopedics;  Laterality: N/A;  . [place]   rt and lt  . TONSILLECTOMY    . TOTAL KNEE ARTHROPLASTY Left 07/19/2016   Procedure: LEFT TOTAL KNEE ARTHROPLASTY CORTISON INJECTION IN RIGHT KNEE;  Surgeon: Jessy Oto, MD;  Location: [place];  Service: Orthopedics;  Laterality: Left;  MAY NEED RNFA TO FINISH CASE PER [place]   . WRIST SURGERY     lt and rt    There were no vitals filed for this visit.      Subjective Assessment - 10/07/16 1022    Subjective I was great Saturday, but I ate a bunch of watermelon and that makes my legs hurt.  I had to go out into the car my legs hurt so bad.    Currently in Pain? Yes   Pain Score 3             [facility] Adult PT Treatment/Exercise - 10/07/16 0001      Lumbar Exercises: Machines for Strengthening   Other Lumbar Machine Exercise Pilates Tower see note      Modalities   Modalities Moist Heat     Moist Heat Therapy   Number Minutes Moist Heat 8 Minutes   Moist Heat Location Knee  post for manual  Manual Therapy   Joint Mobilization GRII-III extension mobs   patellar mobs , used mobilization belt         Pilates Tower for LE/Core strength, postural strength, lumbopelvic disassociation and core control.  Exercises included:  Supine Leg [place]:   Yellow single parallel and hip ER Arcs and circles (only a few reps on Rt. LE )   double leg arcs and squats in hip ER  Scissors small ROM to protect back   Sidelying Leg [place] LLE hip abd and add, sidekicks  Followed by Manual to L knee for knee ext with MHP post knee            PT Short Term Goals - 09/30/16 0914      PT SHORT TERM GOAL #1   Title Pt will be I with initial HEP for ROM and strength in L. knee    Status Achieved     PT SHORT TERM GOAL #2   Title Pt will be able to go without cane in his home and not aggravate back pain most of  the time.    Status Achieved     PT SHORT TERM GOAL #3   Title Pt will achieve no more than 8 deg knee extension for improved gait, flexibility.    Status On-going           PT Long Term Goals - 09/30/16 0916      PT LONG TERM GOAL #1   Title Pt will understand RICE, posture and body mechanics as it pertains to knee and back pain.    Status On-going     PT LONG TERM GOAL #2   Title Pt will be I with more advanced HEP for knee, core.    Status On-going     PT LONG TERM GOAL #3   Title Pt will less than 55% limited on FOTO to demo functional improvement   Baseline 68%    Status On-going     PT LONG TERM GOAL #4   Title Pt will be able to go outside and participate in family functions without limitations of knee pain, including walking on uneven ground.    Baseline improved , gets flashes of pain in Lt thigh when on uneven ground.    Status Partially Met     PT LONG TERM GOAL #5   Title Pt will be able to walk for 30 min for general health and fitness without lasting knee pain    Baseline <15 min being on it at all , Lt leg hurts, back begins to hurt and buttocks.    Status On-going               Plan - 10/07/16 1034    Clinical Impression Statement Pt in contact with JAS rep, plans to be measured this week. Used PIlates Tower for knee extension and core stability, care taken to ensure safety for lumbar spine.  No increased in pain.    PT Next Visit Plan extension stretching, manual , use Pilates Reformer or Tower   PT Home Exercise Plan has HHPT level 1-2 ex, given assisted knee flexion, ITB stretch and hamstring as well today. , SLR with ER and hip adduction.    Consulted and Agree with Plan of Care Patient      Patient will benefit from skilled therapeutic intervention in order to improve the following deficits and impairments:  Difficulty walking, Increased fascial restricitons, Pain, Hypomobility, Decreased scar mobility, Impaired flexibility, Increased edema,  Decreased strength, Decreased mobility  Visit Diagnosis: Acute pain of left knee  Stiffness of left knee, not elsewhere classified  Localized edema  Difficulty in walking, not elsewhere classified     Problem List Patient Active Problem List   Diagnosis Date Noted  . Status post total left knee replacement 09/26/2016  . Presence of left artificial knee joint 07/25/2016  . Pain and swelling of left lower leg 07/25/2016  . Bilateral primary osteoarthritis of knee 07/19/2016    Class: Chronic  . Primary localized osteoarthritis of left knee 07/19/2016  . 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 Hypotestosteronemia 09/08/2015  . Cervical spondylosis without myelopathy 03/25/2014    Class: Chronic  . Left cervical radiculopathy 03/25/2014    Class: Chronic  . Cervical spondylosis with radiculopathy 03/25/2014    Teosha Casso 10/07/2016, 12:05 PM  [place], [place], [address] Phone: [phone]   Fax:  [phone]  Name: Jesus Brown MRN: [id number] Date of Birth: 12/06/1965  Raeford Razor, PT 10/07/16 12:05 PM Phone: [phone] Fax: [phone]

## 2016-10-10 ENCOUNTER — Ambulatory Visit: Payer: BLUE CROSS/BLUE SHIELD | Admitting: Physical Therapy

## 2016-10-10 DIAGNOSIS — M25562 Pain in left knee: Secondary | ICD-10-CM | POA: Diagnosis not present

## 2016-10-10 DIAGNOSIS — M25662 Stiffness of left knee, not elsewhere classified: Secondary | ICD-10-CM

## 2016-10-10 DIAGNOSIS — R262 Difficulty in walking, not elsewhere classified: Secondary | ICD-10-CM

## 2016-10-10 DIAGNOSIS — R600 Localized edema: Secondary | ICD-10-CM

## 2016-10-10 NOTE — Therapy (Signed)
[place], [place], [address] Phone: [phone]   Fax:  [phone]  Physical Therapy Treatment  Patient Details  Name: Jesus Brown MRN: [id number] Date of Birth: 09/16/1965 Referring Provider: Dr. Basil Dess  Encounter Date: 10/10/2016      PT End of Session - 10/10/16 1216    Visit Number 16   Number of Visits 22   Date for PT Re-Evaluation 11/01/16   PT Start Time 1103   PT Stop Time 1145   PT Time Calculation (min) 42 min   Activity Tolerance Patient tolerated treatment well   Behavior During Therapy [facility] for tasks assessed/performed      Past Medical History:  Diagnosis Date  . Anxiety    mainly r/t pain  . Arthritis    neck, knees  . Asthma    AS CHILD   . Back pain, chronic   . DDD (degenerative disc disease)   . Dysphagia   . 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 GERD (gastroesophageal reflux disease)   . Hypertension   . Hypotestosteronemia 09/08/2015  . Neck pain, chronic     Past Surgical History:  Procedure Laterality Date  . ANKLE SURGERY    . BACK SURGERY  2010,2011   lumb fusionx2  . CERVICAL FUSION  7915,0569   3 surgeries  . DIRECT LARYNGOSCOPY N/A 11/09/2012   Procedure: DIRECT LARYNGOSCOPY;  Surgeon: Jodi Marble, MD;  Location: [place];  Service: ENT;  Laterality: N/A;  . ESOPHAGOGASTRODUODENOSCOPY N/A 08/03/2012   Procedure: ESOPHAGOGASTRODUODENOSCOPY (EGD);  Surgeon: Arta Silence, MD;  Location: [facility] ENDOSCOPY;  Service: Endoscopy;  Laterality: N/A;  . ESOPHAGOSCOPY W/ BOTOX INJECTION N/A 11/09/2012   Procedure: ESOPHAGOSCOPY POSSIBLE BIOPSY AND POSSIBLE BOTOX INJECTION CRICOPHARYNGEUS;  Surgeon: Jodi Marble, MD;  Location: [place];  Service: ENT;  Laterality: N/A;  Esophagoscopy with botox injection  . HERNIA REPAIR     ing-rt  . KNEE SURGERY     rt and left x2 each=4  . POSTERIOR CERVICAL FUSION/FORAMINOTOMY  N/A 03/25/2014   Procedure: LEFT C7-T1 FORAMINOTOMY;  Surgeon: Jessy Oto, MD;  Location: [place];  Service: Orthopedics;  Laterality: N/A;  . [place]   rt and lt  . TONSILLECTOMY    . TOTAL KNEE ARTHROPLASTY Left 07/19/2016   Procedure: LEFT TOTAL KNEE ARTHROPLASTY CORTISON INJECTION IN RIGHT KNEE;  Surgeon: Jessy Oto, MD;  Location: [place];  Service: Orthopedics;  Laterality: Left;  MAY NEED RNFA TO FINISH CASE PER [place]   . WRIST SURGERY     lt and rt    There were no vitals filed for this visit.      Subjective Assessment - 10/10/16 1111    Subjective Pain a little bit more, 5/10.  Need to get my knee measured.               [place] Adult PT Treatment/Exercise - 10/10/16 0001      Self-Care   Self-Care Other Self-Care Comments   Other Self-Care Comments  measured in supine dimensions for JAS      Knee/Hip Exercises: Stretches   Active Hamstring Stretch 5 reps   Active Hamstring Stretch Limitations contract relax with stretch   Hip Flexor Stretch Limitations /   Knee: Self-Stretch to increase Flexion Left;3 reps   ITB Stretch Left;3 reps     Knee/Hip Exercises: Aerobic   Recumbent Bike 6 min Level 3  Manual Therapy   Manual Therapy Passive ROM;Muscle Energy Technique   Joint Mobilization GRII-III extension mobs   seated ant glide tibia Gr.II-III and distraction with belt    Soft tissue mobilization patellar mobs   Myofascial Release L knee    Passive ROM flexion/extension    Muscle Energy Technique contract relax for knee ext                 PT Education - 10/10/16 1215    Education provided Yes   Education Details JAS measurement , extension    Person(s) Educated Patient   Methods Explanation   Comprehension Verbalized understanding          PT Short Term Goals - 10/10/16 1251      PT SHORT TERM GOAL #1   Title Pt will be I with initial HEP for ROM and strength in L. knee    Status Achieved     PT SHORT TERM GOAL  #2   Title Pt will be able to go without cane in his home and not aggravate back pain most of the time.    Status Achieved     PT SHORT TERM GOAL #3   Title Pt will achieve no more than 8 deg knee extension for improved gait, flexibility.    Status On-going           PT Long Term Goals - 10/10/16 1251      PT LONG TERM GOAL #1   Title Pt will understand RICE, posture and body mechanics as it pertains to knee and back pain.    Status Achieved     PT LONG TERM GOAL #2   Title Pt will be I with more advanced HEP for knee, core.    Status On-going     PT LONG TERM GOAL #3   Title Pt will less than 55% limited on FOTO to demo functional improvement   Status Unable to assess     PT LONG TERM GOAL #4   Title Pt will be able to go outside and participate in family functions without limitations of knee pain, including walking on uneven ground.    Status Partially Met     PT LONG TERM GOAL #5   Title Pt will be able to walk for 30 min for general health and fitness without lasting knee pain    Baseline <15 min being on it at all , Lt leg hurts, back begins to hurt and buttocks.    Status On-going               Plan - 10/10/16 1250    Clinical Impression Statement Measured for JAS today.  Tolerates manual well.  Goals in progress.    PT Next Visit Plan extension stretching, manual , use Pilates Reformer or Tower   PT Home Exercise Plan has HHPT level 1-2 ex, given assisted knee flexion, ITB stretch and hamstring as well today. , SLR with ER and hip adduction.    Consulted and Agree with Plan of Care Patient      Patient will benefit from skilled therapeutic intervention in order to improve the following deficits and impairments:  Difficulty walking, Increased fascial restricitons, Pain, Hypomobility, Decreased scar mobility, Impaired flexibility, Increased edema, Decreased strength, Decreased mobility  Visit Diagnosis: Acute pain of left knee  Stiffness of left knee,  not elsewhere classified  Difficulty in walking, not elsewhere classified  Localized edema     Problem List Patient Active Problem List  Diagnosis Date Noted  . Status post total left knee replacement 09/26/2016  . Presence of left artificial knee joint 07/25/2016  . Pain and swelling of left lower leg 07/25/2016  . Bilateral primary osteoarthritis of knee 07/19/2016    Class: Chronic  . Primary localized osteoarthritis of left knee 07/19/2016  . 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 Hypotestosteronemia 09/08/2015  . Cervical spondylosis without myelopathy 03/25/2014    Class: Chronic  . Left cervical radiculopathy 03/25/2014    Class: Chronic  . Cervical spondylosis with radiculopathy 03/25/2014    Silvanna Ohmer 10/10/2016, 12:55 PM  [place], [place], [address] Phone: [phone]   Fax:  [phone]  Name: Jesus Brown MRN: [id number] Date of Birth: [birth year]   Raeford Razor, PT 10/10/16 12:56 PM Phone: [phone] Fax: [phone]

## 2016-10-14 ENCOUNTER — Ambulatory Visit: Payer: BLUE CROSS/BLUE SHIELD | Admitting: Physical Therapy

## 2016-10-17 ENCOUNTER — Encounter: Payer: BLUE CROSS/BLUE SHIELD | Admitting: Physical Therapy

## 2016-10-21 NOTE — Addendum Note (Signed)
Addendum  created 10/21/16 1745 by Annye Asa, MD   Sign clinical note

## 2016-10-22 ENCOUNTER — Ambulatory Visit: Payer: BLUE CROSS/BLUE SHIELD | Admitting: Physical Therapy

## 2016-10-22 DIAGNOSIS — M25662 Stiffness of left knee, not elsewhere classified: Secondary | ICD-10-CM

## 2016-10-22 DIAGNOSIS — M25562 Pain in left knee: Secondary | ICD-10-CM

## 2016-10-22 DIAGNOSIS — R262 Difficulty in walking, not elsewhere classified: Secondary | ICD-10-CM

## 2016-10-22 DIAGNOSIS — R600 Localized edema: Secondary | ICD-10-CM

## 2016-10-22 NOTE — Therapy (Signed)
[place], [place], [address] Phone: [phone]   Fax:  [phone]  Physical Therapy Treatment  Patient Details  Name: Jesus Brown MRN: [id number] Date of Birth: 11/02/1965 Referring Provider: Dr. Basil Dess  Encounter Date: 10/22/2016      PT End of Session - 10/22/16 1614    Visit Number 17   Number of Visits 22   Date for PT Re-Evaluation 11/01/16   PT Start Time 6962  pt with JAS rep    PT Stop Time 1640   PT Time Calculation (min) 50 min   Activity Tolerance Patient tolerated treatment well   Behavior During Therapy [facility] for tasks assessed/performed      Past Medical History:  Diagnosis Date  . Anxiety    mainly r/t pain  . Arthritis    neck, knees  . Asthma    AS CHILD   . Back pain, chronic   . DDD (degenerative disc disease)   . Dysphagia   . 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 GERD (gastroesophageal reflux disease)   . Hypertension   . Hypotestosteronemia 09/08/2015  . Neck pain, chronic     Past Surgical History:  Procedure Laterality Date  . ANKLE SURGERY    . BACK SURGERY  2010,2011   lumb fusionx2  . CERVICAL FUSION  9528,4132   3 surgeries  . DIRECT LARYNGOSCOPY N/A 11/09/2012   Procedure: DIRECT LARYNGOSCOPY;  Surgeon: Jodi Marble, MD;  Location: [place];  Service: ENT;  Laterality: N/A;  . ESOPHAGOGASTRODUODENOSCOPY N/A 08/03/2012   Procedure: ESOPHAGOGASTRODUODENOSCOPY (EGD);  Surgeon: Arta Silence, MD;  Location: [facility] ENDOSCOPY;  Service: Endoscopy;  Laterality: N/A;  . ESOPHAGOSCOPY W/ BOTOX INJECTION N/A 11/09/2012   Procedure: ESOPHAGOSCOPY POSSIBLE BIOPSY AND POSSIBLE BOTOX INJECTION CRICOPHARYNGEUS;  Surgeon: Jodi Marble, MD;  Location: [place];  Service: ENT;  Laterality: N/A;  Esophagoscopy with botox injection  . HERNIA REPAIR     ing-rt  . KNEE SURGERY     rt and left x2 each=4  . POSTERIOR CERVICAL  FUSION/FORAMINOTOMY N/A 03/25/2014   Procedure: LEFT C7-T1 FORAMINOTOMY;  Surgeon: Jessy Oto, MD;  Location: [place];  Service: Orthopedics;  Laterality: N/A;  . [place]   rt and lt  . TONSILLECTOMY    . TOTAL KNEE ARTHROPLASTY Left 07/19/2016   Procedure: LEFT TOTAL KNEE ARTHROPLASTY CORTISON INJECTION IN RIGHT KNEE;  Surgeon: Jessy Oto, MD;  Location: [place];  Service: Orthopedics;  Laterality: Left;  MAY NEED RNFA TO FINISH CASE PER [place]   . WRIST SURGERY     lt and rt    There were no vitals filed for this visit.      Subjective Assessment - 10/22/16 1551    Subjective Pt received JAS today, fit well.    Currently in Pain? Yes              [place] Adult PT Treatment/Exercise - 10/22/16 0001      Lumbar Exercises: Machines for Strengthening   Other Lumbar Machine Exercise Pilates Reformer see note      Knee/Hip Exercises: Standing   Functional Squat 1 set;10 reps   Functional Squat Limitations increased back pain      Moist Heat Therapy   Number Minutes Moist Heat 10 Minutes   Moist Heat Location Knee     Manual Therapy   Joint Mobilization GRII-III extension mobs   seated ant glide tibia  Gr.II-III and distraction with belt    Soft tissue mobilization patellar mobs   Myofascial Release L knee    Passive ROM flexion/extension       Pilates Reformer used for LE/core strength, postural strength, lumbopelvic disassociation and core control.  Exercises included: Footwork  2 Red 1 blue double leg and single leg  Cues to fully extend knee. Heel stretching  Feet in Straps 1 Red 1 yellow arcs and circles, smaller ROM due to back issues.  Needs cues to control hip flexion phase of the arc.           PT Education - 10/22/16 1612    Education provided Yes   Education Details Pilates Reformer    Person(s) Educated Patient   Methods Explanation;Tactile cues;Verbal cues   Comprehension Verbalized understanding;Returned demonstration           PT Short Term Goals - 10/10/16 1251      PT SHORT TERM GOAL #1   Title Pt will be I with initial HEP for ROM and strength in L. knee    Status Achieved     PT SHORT TERM GOAL #2   Title Pt will be able to go without cane in his home and not aggravate back pain most of the time.    Status Achieved     PT SHORT TERM GOAL #3   Title Pt will achieve no more than 8 deg knee extension for improved gait, flexibility.    Status On-going           PT Long Term Goals - 10/10/16 1251      PT LONG TERM GOAL #1   Title Pt will understand RICE, posture and body mechanics as it pertains to knee and back pain.    Status Achieved     PT LONG TERM GOAL #2   Title Pt will be I with more advanced HEP for knee, core.    Status On-going     PT LONG TERM GOAL #3   Title Pt will less than 55% limited on FOTO to demo functional improvement   Status Unable to assess     PT LONG TERM GOAL #4   Title Pt will be able to go outside and participate in family functions without limitations of knee pain, including walking on uneven ground.    Status Partially Met     PT LONG TERM GOAL #5   Title Pt will be able to walk for 30 min for general health and fitness without lasting knee pain    Baseline <15 min being on it at all , Lt leg hurts, back begins to hurt and buttocks.    Status On-going               Plan - 10/22/16 1619    Clinical Impression Statement Patient received JAs with good fit, plans to use tonight.  Max extension measured at 9 deg. on Reformer.  Able to do level 1 Reformer ex wihtout increasing back pain.  Unable to do so with standing, squats.    PT Next Visit Plan extension stretching, manual , use Pilates Reformer or Tower, JAS at home?    PT Home Exercise Plan has HHPT level 1-2 ex, given assisted knee flexion, ITB stretch and hamstring as well today. , SLR with ER and hip adduction.    Consulted and Agree with Plan of Care Patient      Patient will benefit from  skilled therapeutic intervention in order to  improve the following deficits and impairments:  Difficulty walking, Increased fascial restricitons, Pain, Hypomobility, Decreased scar mobility, Impaired flexibility, Increased edema, Decreased strength, Decreased mobility  Visit Diagnosis: Acute pain of left knee  Difficulty in walking, not elsewhere classified  Localized edema  Stiffness of left knee, not elsewhere classified     Problem List Patient Active Problem List   Diagnosis Date Noted  . Status post total left knee replacement 09/26/2016  . Presence of left artificial knee joint 07/25/2016  . Pain and swelling of left lower leg 07/25/2016  . Bilateral primary osteoarthritis of knee 07/19/2016    Class: Chronic  . Primary localized osteoarthritis of left knee 07/19/2016  . 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 Hypotestosteronemia 09/08/2015  . Cervical spondylosis without myelopathy 03/25/2014    Class: Chronic  . Left cervical radiculopathy 03/25/2014    Class: Chronic  . Cervical spondylosis with radiculopathy 03/25/2014    Masai Kidd 10/22/2016, 4:33 PM  [place], [place], [address] Phone: [phone]   Fax:  [phone]  Name: Jesus Brown MRN: [id number] Date of Birth: 1965/09/06  Raeford Razor, PT 10/22/16 4:33 PM Phone: [phone] Fax: [phone]

## 2016-10-30 ENCOUNTER — Encounter: Payer: BLUE CROSS/BLUE SHIELD | Admitting: Physical Therapy

## 2016-10-31 ENCOUNTER — Ambulatory Visit (INDEPENDENT_AMBULATORY_CARE_PROVIDER_SITE_OTHER): Payer: Medicare Other | Admitting: Specialist

## 2016-11-12 ENCOUNTER — Ambulatory Visit: Payer: BLUE CROSS/BLUE SHIELD | Attending: Specialist | Admitting: Physical Therapy

## 2016-11-12 DIAGNOSIS — M25562 Pain in left knee: Secondary | ICD-10-CM | POA: Insufficient documentation

## 2016-11-12 DIAGNOSIS — R600 Localized edema: Secondary | ICD-10-CM | POA: Diagnosis present

## 2016-11-12 DIAGNOSIS — M25662 Stiffness of left knee, not elsewhere classified: Secondary | ICD-10-CM | POA: Insufficient documentation

## 2016-11-12 DIAGNOSIS — R262 Difficulty in walking, not elsewhere classified: Secondary | ICD-10-CM | POA: Insufficient documentation

## 2016-11-12 NOTE — Therapy (Signed)
[place], [place], [address] Phone: [phone]   Fax:  [phone]  Physical Therapy Treatment/Renewal  Patient Details  Name: Jesus Brown MRN: [id number] Date of Birth: 08-Sep-1965 Referring Provider: Dr. Basil Dess  Encounter Date: 11/12/2016      PT End of Session - 11/12/16 1310    Visit Number 18   Number of Visits 30   Date for PT Re-Evaluation 12/24/16   PT Start Time 0175   PT Stop Time 1100   PT Time Calculation (min) 45 min   Activity Tolerance Patient tolerated treatment well   Behavior During Therapy [facility] for tasks assessed/performed      Past Medical History:  Diagnosis Date  . Anxiety    mainly r/t pain  . Arthritis    neck, knees  . Asthma    AS CHILD   . Back pain, chronic   . DDD (degenerative disc disease)   . Dysphagia   . 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 GERD (gastroesophageal reflux disease)   . Hypertension   . Hypotestosteronemia 09/08/2015  . Neck pain, chronic     Past Surgical History:  Procedure Laterality Date  . ANKLE SURGERY    . BACK SURGERY  2010,2011   lumb fusionx2  . CERVICAL FUSION  1025,8527   3 surgeries  . DIRECT LARYNGOSCOPY N/A 11/09/2012   Procedure: DIRECT LARYNGOSCOPY;  Surgeon: Jodi Marble, MD;  Location: [place];  Service: ENT;  Laterality: N/A;  . ESOPHAGOGASTRODUODENOSCOPY N/A 08/03/2012   Procedure: ESOPHAGOGASTRODUODENOSCOPY (EGD);  Surgeon: Arta Silence, MD;  Location: [facility] ENDOSCOPY;  Service: Endoscopy;  Laterality: N/A;  . ESOPHAGOSCOPY W/ BOTOX INJECTION N/A 11/09/2012   Procedure: ESOPHAGOSCOPY POSSIBLE BIOPSY AND POSSIBLE BOTOX INJECTION CRICOPHARYNGEUS;  Surgeon: Jodi Marble, MD;  Location: [place];  Service: ENT;  Laterality: N/A;  Esophagoscopy with botox injection  . HERNIA REPAIR     ing-rt  . KNEE SURGERY     rt and left x2 each=4  . POSTERIOR CERVICAL  FUSION/FORAMINOTOMY N/A 03/25/2014   Procedure: LEFT C7-T1 FORAMINOTOMY;  Surgeon: Jessy Oto, MD;  Location: [place];  Service: Orthopedics;  Laterality: N/A;  . [place]   rt and lt  . TONSILLECTOMY    . TOTAL KNEE ARTHROPLASTY Left 07/19/2016   Procedure: LEFT TOTAL KNEE ARTHROPLASTY CORTISON INJECTION IN RIGHT KNEE;  Surgeon: Jessy Oto, MD;  Location: [place];  Service: Orthopedics;  Laterality: Left;  MAY NEED RNFA TO FINISH CASE PER [place]   . WRIST SURGERY     lt and rt    There were no vitals filed for this visit.      Subjective Assessment - 11/12/16 1034    Subjective JAs is a pain to put on.  It was hurting at the top of my thigh and along the sides where the straps were.  Its coming along.  I have Rt. calf pain after standing > 15 min I think its vascular issue.  Stops when I sit down.  I hope its not my back.     Pertinent History multiple knee surgeries, back surgery, neck and shoulder surgeries   Limitations Sitting;Lifting;Standing;Walking;House hold activities;Other (comment)   How long can you stand comfortably? 15 min Rt. Leg and Lt knee starts getting painful after 15-30 min    How long can you walk comfortably? 15- 30 min    Patient Stated Goals Pt wants to walk  normally, knee pain has caused alot of back compensation. I want this knee to be 110% before I do the Rt. one.    Currently in Pain? Yes   Pain Score 3    Pain Location Knee   Pain Orientation Left   Pain Descriptors / Indicators Aching   Pain Type Chronic pain;Surgical pain   Pain Onset More than a month ago   Pain Frequency Intermittent   Aggravating Factors  standing, walking    Pain Relieving Factors RICE, sitting             [facility] PT Assessment - 11/12/16 0001      AROM   Left Knee Extension (P)  11   Left Knee Flexion (P)  115     Strength   Left Knee Flexion (P)  4-/5   Left Knee Extension (P)  4+/5                     [facility] Adult PT Treatment/Exercise  - 11/12/16 0001      Knee/Hip Exercises: Aerobic   Stationary Bike 5 min level 3      Knee/Hip Exercises: Seated   Long Arc Quad Strengthening;Left;20 reps   Long Arc Quad Weight 7 lbs.   Long [employer] Limitations 10 second hold and release     Knee/Hip Exercises: Supine   Quad Sets Strengthening;Left;1 set     Knee/Hip Exercises: Prone   Hip Extension AROM;Strengthening;Left;2 sets;10 reps     Moist Heat Therapy   Number Minutes Moist Heat 15 Minutes   Moist Heat Location Knee     Manual Therapy   Joint Mobilization GRII-III extension mobs   seated ant glide tibia Gr.II-III and distraction with belt    Soft tissue mobilization patellar mobs, calf and hamstring STM   Myofascial Release L knee    Passive ROM flexion/extension                 PT Education - 11/12/16 1310    Education provided Yes   Education Details progress, renewal, ROM    Person(s) Educated Patient   Methods Explanation   Comprehension Verbalized understanding          PT Short Term Goals - 11/12/16 1318      PT SHORT TERM GOAL #1   Title Pt will be I with initial HEP for ROM and strength in L. knee    Status Achieved     PT SHORT TERM GOAL #2   Title Pt will be able to go without cane in his home and not aggravate back pain most of the time.    Status Achieved     PT SHORT TERM GOAL #3   Title Pt will achieve no more than 8 deg knee extension for improved gait, flexibility.    Baseline improved AROM to 11 deg anf PROM to 4    Status Partially Met           PT Long Term Goals - 11/12/16 1318      PT LONG TERM GOAL #1   Title Pt will understand RICE, posture and body mechanics as it pertains to knee and back pain.    Status Achieved     PT LONG TERM GOAL #2   Title Pt will be I with more advanced HEP for knee, core.    Status Achieved     PT LONG TERM GOAL #3   Title Pt will less than 55% limited on  FOTO to demo functional improvement   Status On-going     PT LONG TERM  GOAL #4   Title Pt will be able to go outside and participate in family functions without limitations of knee pain, including walking on uneven ground.    Baseline improved , gets flashes of pain in Lt thigh when on uneven ground.    Status On-going     PT LONG TERM GOAL #5   Title Pt will be able to walk for 30 min for general health and fitness without lasting knee pain    Status On-going               Plan - 12-05-16 1311    Clinical Impression Statement Patient with a lapse in PT since not being seen in almost 2 weeks.  PROM to 4 deg, actively 11 lacks extension. Used heat to increase flexibility in hamstrings. Reports significant pain in Rt. calf today with standing >15 min, urged to call MD to investigate source of pain.  Extended POC for 6 weeks to continue manual, introduce Triger point dry needling and and restore function.     Rehab Potential Excellent   PT Frequency 2x / week   PT Duration 6 weeks   PT Treatment/Interventions ADLs/Self Care Home Management;Cryotherapy;Electrical Stimulation;Functional mobility training;Gait training;Stair training;Patient/family education;Passive range of motion;Scar mobilization;Vasopneumatic Device;Neuromuscular re-education;DME Instruction;Balance training;Manual techniques;Taping;Therapeutic exercise;Moist Heat;Therapeutic activities;Dry needling   PT Next Visit Plan extension stretching, manual try DN to hamstrings , use Pilates Reformer or Tower, JAS 3 x per day   PT Home Exercise Plan has HHPT level 1-2 ex, given assisted knee flexion, ITB stretch and hamstring as well today. , SLR with ER and hip adduction.    Consulted and Agree with Plan of Care Patient      Patient will benefit from skilled therapeutic intervention in order to improve the following deficits and impairments:  Difficulty walking, Increased fascial restricitons, Pain, Hypomobility, Decreased scar mobility, Impaired flexibility, Increased edema, Decreased strength,  Decreased mobility  Visit Diagnosis: Acute pain of left knee  Difficulty in walking, not elsewhere classified  Localized edema  Stiffness of left knee, not elsewhere classified       G-Codes - 12/05/16 1319    Functional Assessment Tool Used (Outpatient Only) FOTO   Functional Limitation Mobility: Walking and moving around   Mobility: Walking and Moving Around Current Status [phone]) At least 60 percent but less than 80 percent impaired, limited or restricted   Mobility: Walking and Moving Around Goal Status [phone]) At least 40 percent but less than 60 percent impaired, limited or restricted      Problem List Patient Active Problem List   Diagnosis Date Noted  . Status post total left knee replacement 09/26/2016  . Presence of left artificial knee joint 07/25/2016  . Pain and swelling of left lower leg 07/25/2016  . Bilateral primary osteoarthritis of knee 07/19/2016    Class: Chronic  . Primary localized osteoarthritis of left knee 07/19/2016  . 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 Hypotestosteronemia 09/08/2015  . Cervical spondylosis without myelopathy 03/25/2014    Class: Chronic  . Left cervical radiculopathy 03/25/2014    Class: Chronic  . Cervical spondylosis with radiculopathy 03/25/2014    PAA,JENNIFER 2016/12/05, 1:20 PM  [facility] [address] [place], [place], [address] Phone: [phone]   Fax:  [phone]  Name: Jesus Brown MRN: [id number] Date of Birth: 02/02/1966  Raeford Razor, PT December 05, 2016 1:20 PM  Phone: [phone] Fax: [phone]

## 2016-11-19 ENCOUNTER — Ambulatory Visit: Payer: BLUE CROSS/BLUE SHIELD | Admitting: Physical Therapy

## 2016-11-21 ENCOUNTER — Encounter: Payer: BLUE CROSS/BLUE SHIELD | Admitting: Physical Therapy

## 2016-11-22 ENCOUNTER — Emergency Department (HOSPITAL_COMMUNITY)
Admission: EM | Admit: 2016-11-22 | Discharge: 2016-11-22 | Disposition: A | Discharge status: Home / Self Care | Payer: BLUE CROSS/BLUE SHIELD | Attending: Emergency Medicine | Admitting: Emergency Medicine

## 2016-11-22 ENCOUNTER — Encounter (HOSPITAL_COMMUNITY): Payer: Self-pay | Admitting: *Deleted

## 2016-11-22 ENCOUNTER — Ambulatory Visit: Payer: BLUE CROSS/BLUE SHIELD | Admitting: Physical Therapy

## 2016-11-22 ENCOUNTER — Emergency Department (HOSPITAL_BASED_OUTPATIENT_CLINIC_OR_DEPARTMENT_OTHER)
Admit: 2016-11-22 | Discharge: 2016-11-22 | Disposition: A | Discharge status: Home / Self Care | Payer: BLUE CROSS/BLUE SHIELD | Attending: Emergency Medicine | Admitting: Emergency Medicine

## 2016-11-22 DIAGNOSIS — R0989 Other specified symptoms and signs involving the circulatory and respiratory systems: Secondary | ICD-10-CM | POA: Insufficient documentation

## 2016-11-22 DIAGNOSIS — R262 Difficulty in walking, not elsewhere classified: Secondary | ICD-10-CM

## 2016-11-22 DIAGNOSIS — Z5321 Procedure and treatment not carried out due to patient leaving prior to being seen by health care provider: Secondary | ICD-10-CM | POA: Insufficient documentation

## 2016-11-22 DIAGNOSIS — M25562 Pain in left knee: Secondary | ICD-10-CM | POA: Diagnosis not present

## 2016-11-22 DIAGNOSIS — R2241 Localized swelling, mass and lump, right lower limb: Secondary | ICD-10-CM | POA: Diagnosis not present

## 2016-11-22 DIAGNOSIS — M79609 Pain in unspecified limb: Secondary | ICD-10-CM | POA: Diagnosis not present

## 2016-11-22 DIAGNOSIS — M25662 Stiffness of left knee, not elsewhere classified: Secondary | ICD-10-CM

## 2016-11-22 DIAGNOSIS — R600 Localized edema: Secondary | ICD-10-CM

## 2016-11-22 NOTE — Progress Notes (Signed)
Preliminary results by tech - Right Lower Ext. Venous Duplex Completed. Negative for deep and superficial vein thrombosis in the right leg.  Alaija Ruble, BS, RDMS, RVT  

## 2016-11-22 NOTE — Therapy (Signed)
[place], [place], [address] Phone: [phone]   Fax:  [phone]  Physical Therapy Treatment  Patient Details  Name: Jesus Brown MRN: [id number] Date of Birth: [birth year] Referring Provider: Dr. Basil Dess  Encounter Date: 11/22/2016      PT End of Session - 11/22/16 1102    Visit Number 19   Number of Visits 30   Date for PT Re-Evaluation 12/24/16   PT Start Time 1017   PT Stop Time 1101   PT Time Calculation (min) 44 min   Activity Tolerance Patient tolerated treatment well   Behavior During Therapy [facility] for tasks assessed/performed      Past Medical History:  Diagnosis Date  . Anxiety    mainly r/t pain  . Arthritis    neck, knees  . Asthma    AS CHILD   . Back pain, chronic   . DDD (degenerative disc disease)   . Dysphagia   . 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 GERD (gastroesophageal reflux disease)   . Hypertension   . Hypotestosteronemia 09/08/2015  . Neck pain, chronic     Past Surgical History:  Procedure Laterality Date  . ANKLE SURGERY    . BACK SURGERY  2010,2011   lumb fusionx2  . CERVICAL FUSION  5277,8242   3 surgeries  . DIRECT LARYNGOSCOPY N/A 11/09/2012   Procedure: DIRECT LARYNGOSCOPY;  Surgeon: Jodi Marble, MD;  Location: [place];  Service: ENT;  Laterality: N/A;  . ESOPHAGOGASTRODUODENOSCOPY N/A 08/03/2012   Procedure: ESOPHAGOGASTRODUODENOSCOPY (EGD);  Surgeon: Arta Silence, MD;  Location: [facility] ENDOSCOPY;  Service: Endoscopy;  Laterality: N/A;  . ESOPHAGOSCOPY W/ BOTOX INJECTION N/A 11/09/2012   Procedure: ESOPHAGOSCOPY POSSIBLE BIOPSY AND POSSIBLE BOTOX INJECTION CRICOPHARYNGEUS;  Surgeon: Jodi Marble, MD;  Location: [place];  Service: ENT;  Laterality: N/A;  Esophagoscopy with botox injection  . HERNIA REPAIR     ing-rt  . KNEE SURGERY     rt and left x2 each=4  . POSTERIOR CERVICAL FUSION/FORAMINOTOMY  N/A 03/25/2014   Procedure: LEFT C7-T1 FORAMINOTOMY;  Surgeon: Jessy Oto, MD;  Location: [place];  Service: Orthopedics;  Laterality: N/A;  . [place]   rt and lt  . TONSILLECTOMY    . TOTAL KNEE ARTHROPLASTY Left 07/19/2016   Procedure: LEFT TOTAL KNEE ARTHROPLASTY CORTISON INJECTION IN RIGHT KNEE;  Surgeon: Jessy Oto, MD;  Location: [place];  Service: Orthopedics;  Laterality: Left;  MAY NEED RNFA TO FINISH CASE PER [place]   . WRIST SURGERY     lt and rt    There were no vitals filed for this visit.      Subjective Assessment - 11/22/16 1029    Subjective Do you know what arachnoiditis is.  Thats what I have in my back., my pain mgmt MD told me yesterday.  I want you to get my knee straight whatever it takes.  Had to cancel because of allergies.    Currently in Pain? Yes   Pain Score 3    Pain Location Knee   Pain Orientation Left   Pain Descriptors / Indicators Aching;Tightness   Pain Type Chronic pain   Pain Onset More than a month ago   Pain Frequency Intermittent   Aggravating Factors  stand, walk    Pain Relieving Factors ice, heat and rest               [facility] Adult  PT Treatment/Exercise - 11/22/16 0001      Knee/Hip Exercises: Stretches   Active Hamstring Stretch 5 reps   Passive Hamstring Stretch 5 reps   ITB Stretch Left;3 reps     Knee/Hip Exercises: Aerobic   Stationary Bike 5 min level 3      Knee/Hip Exercises: Prone   Straight Leg Raises Limitations prone quad set x 20      Moist Heat Therapy   Number Minutes Moist Heat 15 Minutes   Moist Heat Location Knee     Manual Therapy   Joint Mobilization GRII-III extension mobs   seated ant glide tibia Gr.II-III and distraction with belt    Soft tissue mobilization patellar mobs, calf and hamstring STM   Myofascial Release L knee    Passive ROM flexion/extension           Trigger Point Dry Needling - 11/22/16 1213    Consent Given? Yes   Education Handout Provided Yes    Muscles Treated Lower Body Hamstring   Hamstring Response Twitch response elicited;Palpable increased muscle length                PT Short Term Goals - 11/22/16 1218      PT SHORT TERM GOAL #3   Title Pt will achieve no more than 8 deg knee extension for improved gait, flexibility.    Baseline improved AROM to 8 deg and PROM to 3    Status Partially Met           PT Long Term Goals - 11/12/16 1318      PT LONG TERM GOAL #1   Title Pt will understand RICE, posture and body mechanics as it pertains to knee and back pain.    Status Achieved     PT LONG TERM GOAL #2   Title Pt will be I with more advanced HEP for knee, core.    Status Achieved     PT LONG TERM GOAL #3   Title Pt will less than 55% limited on FOTO to demo functional improvement   Status On-going     PT LONG TERM GOAL #4   Title Pt will be able to go outside and participate in family functions without limitations of knee pain, including walking on uneven ground.    Baseline improved , gets flashes of pain in Lt thigh when on uneven ground.    Status On-going     PT LONG TERM GOAL #5   Title Pt will be able to walk for 30 min for general health and fitness without lasting knee pain    Status On-going               Plan - 11/22/16 1220    Clinical Impression Statement Patient underwent trigger point dry needling today by Carlus Pavlov, DPT in 3 areas of L Lateral hamstring.  Tolerated well.  Used MHP and manual to reduce muscle soreness.  Focused on manual to gain maximal knee extension. PROM to 3 deg today.  Using JAS 3 times per day but only for about 15 min.  Urged to do 30 min if possible, even if it is 1 of the 3 sessions.     PT Next Visit Plan extension stretching, manual try DN to hamstrings again if agreeable , use Pilates Reformer or Tower, JAS 3 x per day   PT Home Exercise Plan has HHPT level 1-2 ex, given assisted knee flexion, ITB stretch and hamstring as well today. ,  SLR with ER and  hip adduction.    Consulted and Agree with Plan of Care Patient      Patient will benefit from skilled therapeutic intervention in order to improve the following deficits and impairments:  Difficulty walking, Increased fascial restricitons, Pain, Hypomobility, Decreased scar mobility, Impaired flexibility, Increased edema, Decreased strength, Decreased mobility  Visit Diagnosis: Acute pain of left knee  Difficulty in walking, not elsewhere classified  Localized edema  Stiffness of left knee, not elsewhere classified     Problem List Patient Active Problem List   Diagnosis Date Noted  . Status post total left knee replacement 09/26/2016  . Presence of left artificial knee joint 07/25/2016  . Pain and swelling of left lower leg 07/25/2016  . Bilateral primary osteoarthritis of knee 07/19/2016    Class: Chronic  . Primary localized osteoarthritis of left knee 07/19/2016  . 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 Hypotestosteronemia 09/08/2015  . Cervical spondylosis without myelopathy 03/25/2014    Class: Chronic  . Left cervical radiculopathy 03/25/2014    Class: Chronic  . Cervical spondylosis with radiculopathy 03/25/2014    Jocelynne Duquette 11/22/2016, 12:23 PM  [place], [place], [address] Phone: [phone]   Fax:  [phone]  Name: DAMARI SUASTEGUI MRN: [id number] Date of Birth: Feb 05, 1966  Raeford Razor, PT 11/22/16 12:23 PM Phone: [phone] Fax: [phone]

## 2016-11-22 NOTE — ED Triage Notes (Signed)
Pt is here with right lower leg swelling and pain.  Palpable dorsalis pedal pulse and sent here to rule out dvt

## 2016-11-25 ENCOUNTER — Ambulatory Visit: Payer: BLUE CROSS/BLUE SHIELD | Admitting: Physical Therapy

## 2016-11-25 ENCOUNTER — Encounter: Payer: Self-pay | Admitting: Physical Therapy

## 2016-11-25 DIAGNOSIS — M25562 Pain in left knee: Secondary | ICD-10-CM

## 2016-11-25 DIAGNOSIS — R262 Difficulty in walking, not elsewhere classified: Secondary | ICD-10-CM

## 2016-11-25 DIAGNOSIS — R600 Localized edema: Secondary | ICD-10-CM

## 2016-11-25 DIAGNOSIS — M25662 Stiffness of left knee, not elsewhere classified: Secondary | ICD-10-CM

## 2016-11-25 NOTE — Therapy (Signed)
[place], [place], [address] Phone: [phone]   Fax:  [phone]  Physical Therapy Treatment  Patient Details  Name: Jesus Brown MRN: [id number] Date of Birth: 01-29-1966 Referring Provider: Dr. Basil Dess  Encounter Date: 11/25/2016      PT End of Session - 11/25/16 1241    Visit Number 20   Number of Visits 30   Date for PT Re-Evaluation 12/24/16   PT Start Time 1146   PT Stop Time 1240   PT Time Calculation (min) 54 min   Activity Tolerance Patient tolerated treatment well   Behavior During Therapy [facility] for tasks assessed/performed      Past Medical History:  Diagnosis Date  . Anxiety    mainly r/t pain  . Arthritis    neck, knees  . Asthma    AS CHILD   . Back pain, chronic   . DDD (degenerative disc disease)   . Dysphagia   . 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 GERD (gastroesophageal reflux disease)   . Hypertension   . Hypotestosteronemia 09/08/2015  . Neck pain, chronic     Past Surgical History:  Procedure Laterality Date  . ANKLE SURGERY    . BACK SURGERY  2010,2011   lumb fusionx2  . CERVICAL FUSION  6962,9528   3 surgeries  . DIRECT LARYNGOSCOPY N/A 11/09/2012   Procedure: DIRECT LARYNGOSCOPY;  Surgeon: Jodi Marble, MD;  Location: [place];  Service: ENT;  Laterality: N/A;  . ESOPHAGOGASTRODUODENOSCOPY N/A 08/03/2012   Procedure: ESOPHAGOGASTRODUODENOSCOPY (EGD);  Surgeon: Arta Silence, MD;  Location: [facility] ENDOSCOPY;  Service: Endoscopy;  Laterality: N/A;  . ESOPHAGOSCOPY W/ BOTOX INJECTION N/A 11/09/2012   Procedure: ESOPHAGOSCOPY POSSIBLE BIOPSY AND POSSIBLE BOTOX INJECTION CRICOPHARYNGEUS;  Surgeon: Jodi Marble, MD;  Location: [place];  Service: ENT;  Laterality: N/A;  Esophagoscopy with botox injection  . HERNIA REPAIR     ing-rt  . KNEE SURGERY     rt and left x2 each=4  . POSTERIOR CERVICAL FUSION/FORAMINOTOMY  N/A 03/25/2014   Procedure: LEFT C7-T1 FORAMINOTOMY;  Surgeon: Jessy Oto, MD;  Location: [place];  Service: Orthopedics;  Laterality: N/A;  . [place]   rt and lt  . TONSILLECTOMY    . TOTAL KNEE ARTHROPLASTY Left 07/19/2016   Procedure: LEFT TOTAL KNEE ARTHROPLASTY CORTISON INJECTION IN RIGHT KNEE;  Surgeon: Jessy Oto, MD;  Location: [place];  Service: Orthopedics;  Laterality: Left;  MAY NEED RNFA TO FINISH CASE PER [place]   . WRIST SURGERY     lt and rt    There were no vitals filed for this visit.      Subjective Assessment - 11/25/16 1148    Subjective "I wasn't too sore after the last session, I felt like I had quite a bit of movement after the last session and was very pleased"   Currently in Pain? Yes   Pain Score 5    Pain Location Leg   Pain Orientation Left            [facility] PT Assessment - 11/25/16 1154      AROM   Left Knee Extension -9  -5 at the end of the session                     [facility] Adult PT Treatment/Exercise - 11/25/16 1204      Knee/Hip Exercises: Stretches   Active  Hamstring Stretch 3 reps;30 seconds  contract/ relax with 10 sec contract x 3   Passive Hamstring Stretch 3 reps;60 seconds     Knee/Hip Exercises: Aerobic   Stationary Bike 5 min level 3      Moist Heat Therapy   Number Minutes Moist Heat 10 Minutes     Manual Therapy   Joint Mobilization Grade 3 - 4 in sitting using belt with foot externally rotated for screw home mechanism   Soft tissue mobilization IATSM over lateral hamstring with active release techniques   Myofascial Release fascial stretch / rolling over lateral hamstring, and fascial spitting technique during stretch          Trigger Point Dry Needling - 11/25/16 1200    Consent Given? Yes   Education Handout Provided No   Muscles Treated Lower Body Hamstring   Hamstring Response Palpable increased muscle length;Twitch response elicited              PT Education - 11/25/16  1240    Education provided Yes   Education Details effects on the hamstrings on the low back   Person(s) Educated Patient   Methods Explanation;Verbal cues   Comprehension Verbalized understanding;Verbal cues required          PT Short Term Goals - 11/22/16 1218      PT SHORT TERM GOAL #3   Title Pt will achieve no more than 8 deg knee extension for improved gait, flexibility.    Baseline improved AROM to 8 deg and PROM to 3    Status Partially Met           PT Long Term Goals - 11/12/16 1318      PT LONG TERM GOAL #1   Title Pt will understand RICE, posture and body mechanics as it pertains to knee and back pain.    Status Achieved     PT LONG TERM GOAL #2   Title Pt will be I with more advanced HEP for knee, core.    Status Achieved     PT LONG TERM GOAL #3   Title Pt will less than 55% limited on FOTO to demo functional improvement   Status On-going     PT LONG TERM GOAL #4   Title Pt will be able to go outside and participate in family functions without limitations of knee pain, including walking on uneven ground.    Baseline improved , gets flashes of pain in Lt thigh when on uneven ground.    Status On-going     PT LONG TERM GOAL #5   Title Pt will be able to walk for 30 min for general health and fitness without lasting knee pain    Status On-going               Plan - 11/25/16 1241    Clinical Impression Statement pt reports improvement in knee mobility and decreased pain/ tightness. pt arrived at -9 degrees today. continued TPDN over the lateral hamstrings, followed with IASTM with active release techniques. continued mobs in sitting with foot in external rotation to promote screw home mechanism. post session pt exhibit -5 degrees and declined modalites reporting no pain and relief of pain.    PT Treatment/Interventions ADLs/Self Care Home Management;Cryotherapy;Electrical Stimulation;Functional mobility training;Gait training;Stair  training;Patient/family education;Passive range of motion;Scar mobilization;Vasopneumatic Device;Neuromuscular re-education;DME Instruction;Balance training;Manual techniques;Taping;Therapeutic exercise;Moist Heat;Therapeutic activities;Dry needling   PT Next Visit Plan assess response to DN, extension stretching, manual try DN to hamstrings again if agreeable ,  use Pilates Reformer or Tower, JAS 3 x per day   PT Home Exercise Plan has HHPT level 1-2 ex, given assisted knee flexion, ITB stretch and hamstring as well today. , SLR with ER and hip adduction.    Consulted and Agree with Plan of Care Patient      Patient will benefit from skilled therapeutic intervention in order to improve the following deficits and impairments:  Difficulty walking, Increased fascial restricitons, Pain, Hypomobility, Decreased scar mobility, Impaired flexibility, Increased edema, Decreased strength, Decreased mobility  Visit Diagnosis: Acute pain of left knee  Difficulty in walking, not elsewhere classified  Localized edema  Stiffness of left knee, not elsewhere classified     Problem List Patient Active Problem List   Diagnosis Date Noted  . Status post total left knee replacement 09/26/2016  . Presence of left artificial knee joint 07/25/2016  . Pain and swelling of left lower leg 07/25/2016  . Bilateral primary osteoarthritis of knee 07/19/2016    Class: Chronic  . Primary localized osteoarthritis of left knee 07/19/2016  . 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 Hypotestosteronemia 09/08/2015  . Cervical spondylosis without myelopathy 03/25/2014    Class: Chronic  . Left cervical radiculopathy 03/25/2014    Class: Chronic  . Cervical spondylosis with radiculopathy 03/25/2014    Starr Lake PT, DPT, LAT, ATC  11/25/16  12:48 PM      [facility] [address] [place], [place], [address] Phone: [phone]   Fax:   [phone]  Name: Jesus Brown MRN: [id number] Date of Birth: 31-Jul-1965

## 2016-11-26 ENCOUNTER — Ambulatory Visit: Payer: BLUE CROSS/BLUE SHIELD | Admitting: Physical Therapy

## 2016-11-27 ENCOUNTER — Ambulatory Visit: Payer: BLUE CROSS/BLUE SHIELD | Admitting: Physical Therapy

## 2016-11-27 DIAGNOSIS — M25562 Pain in left knee: Secondary | ICD-10-CM | POA: Diagnosis not present

## 2016-11-27 DIAGNOSIS — R600 Localized edema: Secondary | ICD-10-CM

## 2016-11-27 DIAGNOSIS — M25662 Stiffness of left knee, not elsewhere classified: Secondary | ICD-10-CM

## 2016-11-27 DIAGNOSIS — R262 Difficulty in walking, not elsewhere classified: Secondary | ICD-10-CM

## 2016-11-27 NOTE — Therapy (Signed)
[place], [place], [address] Phone: [phone]   Fax:  [phone]  Physical Therapy Treatment  Patient Details  Name: Jesus Brown MRN: [id number] Date of Birth: 21-Dec-1965 Referring Provider: Dr. Basil Dess  Encounter Date: 11/27/2016      PT End of Session - 11/27/16 1117    Visit Number 21   Number of Visits 30   Date for PT Re-Evaluation 12/24/16   PT Start Time 1017   PT Stop Time 1102   PT Time Calculation (min) 45 min   Activity Tolerance Patient tolerated treatment well   Behavior During Therapy [facility] for tasks assessed/performed      Past Medical History:  Diagnosis Date  . Anxiety    mainly r/t pain  . Arthritis    neck, knees  . Asthma    AS CHILD   . Back pain, chronic   . DDD (degenerative disc disease)   . Dysphagia   . 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 GERD (gastroesophageal reflux disease)   . Hypertension   . Hypotestosteronemia 09/08/2015  . Neck pain, chronic     Past Surgical History:  Procedure Laterality Date  . ANKLE SURGERY    . BACK SURGERY  2010,2011   lumb fusionx2  . CERVICAL FUSION  4503,8882   3 surgeries  . DIRECT LARYNGOSCOPY N/A 11/09/2012   Procedure: DIRECT LARYNGOSCOPY;  Surgeon: Jodi Marble, MD;  Location: [place];  Service: ENT;  Laterality: N/A;  . ESOPHAGOGASTRODUODENOSCOPY N/A 08/03/2012   Procedure: ESOPHAGOGASTRODUODENOSCOPY (EGD);  Surgeon: Arta Silence, MD;  Location: [facility] ENDOSCOPY;  Service: Endoscopy;  Laterality: N/A;  . ESOPHAGOSCOPY W/ BOTOX INJECTION N/A 11/09/2012   Procedure: ESOPHAGOSCOPY POSSIBLE BIOPSY AND POSSIBLE BOTOX INJECTION CRICOPHARYNGEUS;  Surgeon: Jodi Marble, MD;  Location: [place];  Service: ENT;  Laterality: N/A;  Esophagoscopy with botox injection  . HERNIA REPAIR     ing-rt  . KNEE SURGERY     rt and left x2 each=4  . POSTERIOR CERVICAL FUSION/FORAMINOTOMY  N/A 03/25/2014   Procedure: LEFT C7-T1 FORAMINOTOMY;  Surgeon: Jessy Oto, MD;  Location: [place];  Service: Orthopedics;  Laterality: N/A;  . [place]   rt and lt  . TONSILLECTOMY    . TOTAL KNEE ARTHROPLASTY Left 07/19/2016   Procedure: LEFT TOTAL KNEE ARTHROPLASTY CORTISON INJECTION IN RIGHT KNEE;  Surgeon: Jessy Oto, MD;  Location: [place];  Service: Orthopedics;  Laterality: Left;  MAY NEED RNFA TO FINISH CASE PER [place]   . WRIST SURGERY     lt and rt    There were no vitals filed for this visit.      Subjective Assessment - 11/27/16 1024    Subjective Went to ED Friday thought they had a blood clot in Rt. LE.  [place] was done.  Knee feels looser.  Rt. leg keeps swelling when i stand and walk for >8-10 min.  Dry needling did not help back but felt really good on hamstrings and knee.    Pertinent History multiple knee surgeries, back surgery, neck and shoulder surgeries   Currently in Pain? Yes   Pain Score 3    Pain Location Knee   Pain Orientation Left   Pain Descriptors / Indicators Aching;Tightness   Pain Type Chronic pain   Pain Onset More than a month ago   Pain Frequency Intermittent   Aggravating Factors  standing, walking    Pain  Relieving Factors ice, dry needling, rest               [facility] Adult PT Treatment/Exercise - 11/27/16 0001      Lumbar Exercises: Machines for Strengthening   Cybex Knee Extension 25 lbs 3 x 15 reps bilat, concentric and eccentric    Cybex Knee Flexion 25 lbs bilat x 15, and unilateral 10 lbs LLE x 20    Leg Press 2 plates calf stretch followed by heel raises and then 2 plate leg press bilaterally.  LLE single leg 1 plate x 10   used Rt LE to assist if needed     Knee/Hip Exercises: Stretches   Active Hamstring Stretch 3 reps;30 seconds  contract/ relax with 10 sec contract x 3     Knee/Hip Exercises: Aerobic   Stationary Bike 5 min level 3      Manual Therapy   Joint Mobilization Grade 3 - 4 in sitting using belt  with foot externally rotated for screw home mechanism  used belt in supine to pull tibia ant., also ext to femur                PT Education - 11/27/16 1115    Education provided Yes   Education Details weight machines in gym light weight    Person(s) Educated Patient   Methods Explanation   Comprehension Verbalized understanding          PT Short Term Goals - 11/27/16 1126      PT SHORT TERM GOAL #1   Title Pt will be I with initial HEP for ROM and strength in L. knee    Status Achieved     PT SHORT TERM GOAL #2   Title Pt will be able to go without cane in his home and not aggravate back pain most of the time.    Status Achieved     PT SHORT TERM GOAL #3   Title Pt will achieve no more than 8 deg knee extension for improved gait, flexibility.    Status Partially Met           PT Long Term Goals - 11/27/16 1127      PT LONG TERM GOAL #1   Title Pt will understand RICE, posture and body mechanics as it pertains to knee and back pain.    Status Achieved     PT LONG TERM GOAL #2   Title Pt will be I with more advanced HEP for knee, core.    Status Achieved     PT LONG TERM GOAL #3   Title Pt will less than 55% limited on FOTO to demo functional improvement   Status On-going     PT LONG TERM GOAL #4   Title Pt will be able to go outside and participate in family functions without limitations of knee pain, including walking on uneven ground.    Status On-going     PT LONG TERM GOAL #5   Title Pt will be able to walk for 30 min for general health and fitness without lasting knee pain    Status On-going               Plan - 11/27/16 1119    Clinical Impression Statement Patient continues to have bilateral lower leg swelling, pitting today in LLE.  Rt. calf burns with walking (arachnoiditis).  Rt, knee ROM back to lacking 8 dg extension.    PT Next Visit Plan assess response to DN,  extension stretching, manual try DN to hamstrings again if  agreeable , use Pilates Reformer or Tower, JAS 3 x per day   PT Home Exercise Plan has HHPT level 1-2 ex, given assisted knee flexion, ITB stretch and hamstring as well today. , SLR with ER and hip adduction.    Consulted and Agree with Plan of Care Patient      Patient will benefit from skilled therapeutic intervention in order to improve the following deficits and impairments:  Difficulty walking, Increased fascial restricitons, Pain, Hypomobility, Decreased scar mobility, Impaired flexibility, Increased edema, Decreased strength, Decreased mobility  Visit Diagnosis: Acute pain of left knee  Difficulty in walking, not elsewhere classified  Localized edema  Stiffness of left knee, not elsewhere classified     Problem List Patient Active Problem List   Diagnosis Date Noted  . Status post total left knee replacement 09/26/2016  . Presence of left artificial knee joint 07/25/2016  . Pain and swelling of left lower leg 07/25/2016  . Bilateral primary osteoarthritis of knee 07/19/2016    Class: Chronic  . Primary localized osteoarthritis of left knee 07/19/2016  . 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 Hypotestosteronemia 09/08/2015  . Cervical spondylosis without myelopathy 03/25/2014    Class: Chronic  . Left cervical radiculopathy 03/25/2014    Class: Chronic  . Cervical spondylosis with radiculopathy 03/25/2014    Makinley Muscato 11/27/2016, 11:27 AM  [facility] [address] [place], [place], [address] Phone: [phone]   Fax:  [phone]  Name: Jesus Brown MRN: [id number] Date of Birth: 14-Jul-1965   Raeford Razor, PT 11/27/16 11:27 AM Phone: [phone] Fax: [phone]

## 2016-12-02 ENCOUNTER — Ambulatory Visit: Payer: BLUE CROSS/BLUE SHIELD | Admitting: Physical Therapy

## 2016-12-02 ENCOUNTER — Encounter: Payer: Self-pay | Admitting: Physical Therapy

## 2016-12-02 DIAGNOSIS — R262 Difficulty in walking, not elsewhere classified: Secondary | ICD-10-CM

## 2016-12-02 DIAGNOSIS — M25562 Pain in left knee: Secondary | ICD-10-CM | POA: Diagnosis not present

## 2016-12-02 DIAGNOSIS — R600 Localized edema: Secondary | ICD-10-CM

## 2016-12-02 DIAGNOSIS — M25662 Stiffness of left knee, not elsewhere classified: Secondary | ICD-10-CM

## 2016-12-02 NOTE — Therapy (Signed)
[place], [place], [address] Phone: [phone]   Fax:  [phone]  Physical Therapy Treatment  Patient Details  Name: Jesus Brown MRN: [id number] Date of Birth: [birth year] Referring Provider: Dr. Basil Dess  Encounter Date: 12/02/2016      PT End of Session - 12/02/16 1258    Visit Number 22   Number of Visits 30   Date for PT Re-Evaluation 12/24/16   PT Start Time 6546   PT Stop Time 1100   PT Time Calculation (min) 45 min   Activity Tolerance Patient tolerated treatment well   Behavior During Therapy [facility] for tasks assessed/performed      Past Medical History:  Diagnosis Date  . Anxiety    mainly r/t pain  . Arthritis    neck, knees  . Asthma    AS CHILD   . Back pain, chronic   . DDD (degenerative disc disease)   . Dysphagia   . 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 GERD (gastroesophageal reflux disease)   . Hypertension   . Hypotestosteronemia 09/08/2015  . Neck pain, chronic     Past Surgical History:  Procedure Laterality Date  . ANKLE SURGERY    . BACK SURGERY  2010,2011   lumb fusionx2  . CERVICAL FUSION  5035,4656   3 surgeries  . DIRECT LARYNGOSCOPY N/A 11/09/2012   Procedure: DIRECT LARYNGOSCOPY;  Surgeon: Jodi Marble, MD;  Location: [place];  Service: ENT;  Laterality: N/A;  . ESOPHAGOGASTRODUODENOSCOPY N/A 08/03/2012   Procedure: ESOPHAGOGASTRODUODENOSCOPY (EGD);  Surgeon: Arta Silence, MD;  Location: [facility] ENDOSCOPY;  Service: Endoscopy;  Laterality: N/A;  . ESOPHAGOSCOPY W/ BOTOX INJECTION N/A 11/09/2012   Procedure: ESOPHAGOSCOPY POSSIBLE BIOPSY AND POSSIBLE BOTOX INJECTION CRICOPHARYNGEUS;  Surgeon: Jodi Marble, MD;  Location: [place];  Service: ENT;  Laterality: N/A;  Esophagoscopy with botox injection  . HERNIA REPAIR     ing-rt  . KNEE SURGERY     rt and left x2 each=4  . POSTERIOR CERVICAL FUSION/FORAMINOTOMY  N/A 03/25/2014   Procedure: LEFT C7-T1 FORAMINOTOMY;  Surgeon: Jessy Oto, MD;  Location: [place];  Service: Orthopedics;  Laterality: N/A;  . [place]   rt and lt  . TONSILLECTOMY    . TOTAL KNEE ARTHROPLASTY Left 07/19/2016   Procedure: LEFT TOTAL KNEE ARTHROPLASTY CORTISON INJECTION IN RIGHT KNEE;  Surgeon: Jessy Oto, MD;  Location: [place];  Service: Orthopedics;  Laterality: Left;  MAY NEED RNFA TO FINISH CASE PER [place]   . WRIST SURGERY     lt and rt    There were no vitals filed for this visit.      Subjective Assessment - 12/02/16 1021    Subjective Back is not cooperating.  Knee is OK.     Limitations Sitting;Lifting;Standing;Walking;House hold activities;Other (comment)   Patient Stated Goals Pt wants to walk normally, knee pain has caused alot of back compensation. I want this knee to be 110% before I do the Rt. one.    Currently in Pain? Yes   Pain Score 3    Pain Location Knee   Pain Orientation Left   Pain Descriptors / Indicators Aching;Tightness   Pain Type Chronic pain   Pain Onset More than a month ago   Pain Frequency Intermittent   Multiple Pain Sites Yes   Pain Score 7   Pain Location Back   Pain Orientation Right;Lower   Pain Descriptors /  Indicators Burning   Pain Type Chronic pain            [facility] PT Assessment - 12/02/16 0001      AROM   Left Knee Extension 6     PROM   Overall PROM Comments 4 deg in standing   lacks full extension in standing off step                 [facility] Adult PT Treatment/Exercise - 12/02/16 0001      Knee/Hip Exercises: Stretches   Active Hamstring Stretch 3 reps;30 seconds  contract/ relax with 10 sec contract x 3   Quad Stretch Left;3 reps;30 seconds   Quad Stretch Limitations standing    Gastroc Stretch Left;3 reps;30 seconds   Gastroc Stretch Limitations off step      Knee/Hip Exercises: Aerobic   Stationary Bike 5 min level 3      Knee/Hip Exercises: Prone   Hamstring Curl 10  reps   Hamstring Curl Limitations 5 lbs    Prone Knee Hang 3 minutes;Weights   Prone Knee Hang Weights (lbs) 5   Straight Leg Raises Limitations prone quad set x 20      Manual Therapy   Joint Mobilization GRII-III extension mobs   seated ant glide tibia Gr.II-III and distraction with belt    Soft tissue mobilization patellar mobs, calf and hamstring STM   Myofascial Release L knee    Passive ROM flexion/extension    Muscle [employer] Technique hamstring contract relax with PT x 5                 PT Education - 12/02/16 1257    Education provided Yes   Education Details prone knee hang for knee ext    Person(s) Educated Patient   Methods Explanation   Comprehension Verbalized understanding          PT Short Term Goals - 11/27/16 1126      PT SHORT TERM GOAL #1   Title Pt will be I with initial HEP for ROM and strength in L. knee    Status Achieved     PT SHORT TERM GOAL #2   Title Pt will be able to go without cane in his home and not aggravate back pain most of the time.    Status Achieved     PT SHORT TERM GOAL #3   Title Pt will achieve no more than 8 deg knee extension for improved gait, flexibility.    Status Partially Met           PT Long Term Goals - 11/27/16 1127      PT LONG TERM GOAL #1   Title Pt will understand RICE, posture and body mechanics as it pertains to knee and back pain.    Status Achieved     PT LONG TERM GOAL #2   Title Pt will be I with more advanced HEP for knee, core.    Status Achieved     PT LONG TERM GOAL #3   Title Pt will less than 55% limited on FOTO to demo functional improvement   Status On-going     PT LONG TERM GOAL #4   Title Pt will be able to go outside and participate in family functions without limitations of knee pain, including walking on uneven ground.    Status On-going     PT LONG TERM GOAL #5   Title Pt will be able to walk for 30 min for  general health and fitness without lasting knee pain     Status On-going               Plan - 12/02/16 1258    Clinical Impression Statement PROM today in standing -4 deg.  Patient walked out of clinic without knee pain.  Pain in back interferes with ADLs more than knee.  He is worried about his ability to kneel after this and then his Rt. knee surgery.  He may need reinforcement that his knee may never get to 0 extension.     PT Next Visit Plan hamstring DN, extension stretching, manual use Pilates Reformer or Tower, JAS 3 x per day   PT Home Exercise Plan has HHPT level 1-2 ex, given assisted knee flexion, ITB stretch and hamstring as well today. , SLR with ER and hip adduction.    Consulted and Agree with Plan of Care Patient      Patient will benefit from skilled therapeutic intervention in order to improve the following deficits and impairments:  Difficulty walking, Increased fascial restricitons, Pain, Hypomobility, Decreased scar mobility, Impaired flexibility, Increased edema, Decreased strength, Decreased mobility  Visit Diagnosis: Acute pain of left knee  Difficulty in walking, not elsewhere classified  Localized edema  Stiffness of left knee, not elsewhere classified     Problem List Patient Active Problem List   Diagnosis Date Noted  . Status post total left knee replacement 09/26/2016  . Presence of left artificial knee joint 07/25/2016  . Pain and swelling of left lower leg 07/25/2016  . Bilateral primary osteoarthritis of knee 07/19/2016    Class: Chronic  . Primary localized osteoarthritis of left knee 07/19/2016  . 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 Hypotestosteronemia 09/08/2015  . Cervical spondylosis without myelopathy 03/25/2014    Class: Chronic  . Left cervical radiculopathy 03/25/2014    Class: Chronic  . Cervical spondylosis with radiculopathy 03/25/2014    PAA,JENNIFER 12/02/2016, 1:04 PM  [place], [place], [address] Phone: [phone]   Fax:  [phone]  Name: Jesus Brown MRN: [id number] Date of Birth: [birth year]   Raeford Razor, PT 12/02/16 1:05 PM Phone: [phone] Fax: [phone]

## 2016-12-04 ENCOUNTER — Ambulatory Visit: Payer: BLUE CROSS/BLUE SHIELD | Admitting: Physical Therapy

## 2016-12-04 ENCOUNTER — Encounter: Payer: Self-pay | Admitting: Physical Therapy

## 2016-12-04 DIAGNOSIS — M25662 Stiffness of left knee, not elsewhere classified: Secondary | ICD-10-CM

## 2016-12-04 DIAGNOSIS — M25562 Pain in left knee: Secondary | ICD-10-CM | POA: Diagnosis not present

## 2016-12-04 DIAGNOSIS — R262 Difficulty in walking, not elsewhere classified: Secondary | ICD-10-CM

## 2016-12-04 DIAGNOSIS — R600 Localized edema: Secondary | ICD-10-CM

## 2016-12-04 NOTE — Therapy (Signed)
[place], [place], [address] Phone: [phone]   Fax:  [phone]  Physical Therapy Treatment  Patient Details  Name: Jesus Brown MRN: [id number] Date of Birth: 1965-06-14 Referring Provider: Dr. Basil Dess  Encounter Date: 12/04/2016      PT End of Session - 12/04/16 1141    Visit Number 23   Number of Visits 30   Date for PT Re-Evaluation 12/24/16   PT Start Time 1443   PT Stop Time 1105   PT Time Calculation (min) 42 min   Activity Tolerance Patient tolerated treatment well   Behavior During Therapy [facility] for tasks assessed/performed      Past Medical History:  Diagnosis Date  . Anxiety    mainly r/t pain  . Arthritis    neck, knees  . Asthma    AS CHILD   . Back pain, chronic   . DDD (degenerative disc disease)   . Dysphagia   . 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 GERD (gastroesophageal reflux disease)   . Hypertension   . Hypotestosteronemia 09/08/2015  . Neck pain, chronic     Past Surgical History:  Procedure Laterality Date  . ANKLE SURGERY    . BACK SURGERY  2010,2011   lumb fusionx2  . CERVICAL FUSION  1540,0867   3 surgeries  . DIRECT LARYNGOSCOPY N/A 11/09/2012   Procedure: DIRECT LARYNGOSCOPY;  Surgeon: Jodi Marble, MD;  Location: [place];  Service: ENT;  Laterality: N/A;  . ESOPHAGOGASTRODUODENOSCOPY N/A 08/03/2012   Procedure: ESOPHAGOGASTRODUODENOSCOPY (EGD);  Surgeon: Arta Silence, MD;  Location: [facility] ENDOSCOPY;  Service: Endoscopy;  Laterality: N/A;  . ESOPHAGOSCOPY W/ BOTOX INJECTION N/A 11/09/2012   Procedure: ESOPHAGOSCOPY POSSIBLE BIOPSY AND POSSIBLE BOTOX INJECTION CRICOPHARYNGEUS;  Surgeon: Jodi Marble, MD;  Location: [place];  Service: ENT;  Laterality: N/A;  Esophagoscopy with botox injection  . HERNIA REPAIR     ing-rt  . KNEE SURGERY     rt and left x2 each=4  . POSTERIOR CERVICAL FUSION/FORAMINOTOMY  N/A 03/25/2014   Procedure: LEFT C7-T1 FORAMINOTOMY;  Surgeon: Jessy Oto, MD;  Location: [place];  Service: Orthopedics;  Laterality: N/A;  . [place]   rt and lt  . TONSILLECTOMY    . TOTAL KNEE ARTHROPLASTY Left 07/19/2016   Procedure: LEFT TOTAL KNEE ARTHROPLASTY CORTISON INJECTION IN RIGHT KNEE;  Surgeon: Jessy Oto, MD;  Location: [place];  Service: Orthopedics;  Laterality: Left;  MAY NEED RNFA TO FINISH CASE PER [place]   . WRIST SURGERY     lt and rt    There were no vitals filed for this visit.      Subjective Assessment - 12/04/16 1026    Subjective "I am doing alittle better in hamstring, and I feel like the Dn helped some with my other leg"   Currently in Pain? Yes   Pain Score 4    Pain Orientation Left   Pain Descriptors / Indicators Aching   Pain Type Chronic pain   Pain Onset More than a month ago   Pain Frequency Intermittent   Aggravating Factors  standing, walking            [facility] PT Assessment - 12/04/16 0001      AROM   Left Knee Extension -4                     [facility] Adult PT Treatment/Exercise -  12/04/16 1035      Knee/Hip Exercises: Stretches   Active Hamstring Stretch 3 reps;30 seconds  contract/ relax with 10 sec hold     Knee/Hip Exercises: Seated   Long Arc Quad Left;2 sets;15 reps  5#   Sit to [employer] 1 set;10 reps  with controlled eccentrics for glute/ quad strengtheniing     Modalities   Modalities Social worker Location L hamstring   Electrical Stimulation Action E-stim combined with DN   Electrical Stimulation Parameters CRP 12, level just past 1st solide dot x 5 min, progressed to just before 2nd solid dot x 5 min   Electrical Stimulation Goals Other (comment)  fatigue muscle     Manual Therapy   Soft tissue mobilization IASTM over hamstring, tack and stretch technique with active hamstrinc curl in prone          Trigger Point Dry  Needling - 12/04/16 1034    Consent Given? Yes   Education Handout Provided No  given previously   Muscles Treated Lower Body Hamstring   Hamstring Response Twitch response elicited;Palpable increased muscle length  combined with E-stim              PT Education - 12/04/16 1140    Education provided Yes   Education Details DN combined with e-stim for fatiguing muscles. benefits of eccentric muscle training and how it related to activity including stair training.    Person(s) Educated Patient   Methods Explanation;Verbal cues   Comprehension Verbalized understanding;Verbal cues required          PT Short Term Goals - 11/27/16 1126      PT SHORT TERM GOAL #1   Title Pt will be I with initial HEP for ROM and strength in L. knee    Status Achieved     PT SHORT TERM GOAL #2   Title Pt will be able to go without cane in his home and not aggravate back pain most of the time.    Status Achieved     PT SHORT TERM GOAL #3   Title Pt will achieve no more than 8 deg knee extension for improved gait, flexibility.    Status Partially Met           PT Long Term Goals - 11/27/16 1127      PT LONG TERM GOAL #1   Title Pt will understand RICE, posture and body mechanics as it pertains to knee and back pain.    Status Achieved     PT LONG TERM GOAL #2   Title Pt will be I with more advanced HEP for knee, core.    Status Achieved     PT LONG TERM GOAL #3   Title Pt will less than 55% limited on FOTO to demo functional improvement   Status On-going     PT LONG TERM GOAL #4   Title Pt will be able to go outside and participate in family functions without limitations of knee pain, including walking on uneven ground.    Status On-going     PT LONG TERM GOAL #5   Title Pt will be able to walk for 30 min for general health and fitness without lasting knee pain    Status On-going               Plan - 12/04/16 1141    Clinical Impression Statement pt continues to  report pain in the  back and L knee at 3-4/10 today. continued TPDN over the L hamstring combined with E-stim to fatigue the muscle. soft tissue work / technqiues performed to relax hamstring following DN. focused on eccentric strengthening to address pt concern of descending stairs. post session he was able to get -4 degrees of extension and declined modalties post session.    PT Treatment/Interventions ADLs/Self Care Home Management;Cryotherapy;Electrical Stimulation;Functional mobility training;Gait training;Stair training;Patient/family education;Passive range of motion;Scar mobilization;Vasopneumatic Device;Neuromuscular re-education;DME Instruction;Balance training;Manual techniques;Taping;Therapeutic exercise;Moist Heat;Therapeutic activities;Dry needling   PT Next Visit Plan hamstring DN PRN, extension stretching, manual use Pilates Reformer or Tower, JAS 3 x per day, eccentric strengthening    PT Home Exercise Plan has HHPT level 1-2 ex, given assisted knee flexion, ITB stretch and hamstring as well today. , SLR with ER and hip adduction.    Consulted and Agree with Plan of Care Patient      Patient will benefit from skilled therapeutic intervention in order to improve the following deficits and impairments:  Difficulty walking, Increased fascial restricitons, Pain, Hypomobility, Decreased scar mobility, Impaired flexibility, Increased edema, Decreased strength, Decreased mobility  Visit Diagnosis: Acute pain of left knee  Difficulty in walking, not elsewhere classified  Localized edema  Stiffness of left knee, not elsewhere classified     Problem List Patient Active Problem List   Diagnosis Date Noted  . Status post total left knee replacement 09/26/2016  . Presence of left artificial knee joint 07/25/2016  . Pain and swelling of left lower leg 07/25/2016  . Bilateral primary osteoarthritis of knee 07/19/2016    Class: Chronic  . Primary localized osteoarthritis of left knee  07/19/2016  . 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 Hypotestosteronemia 09/08/2015  . Cervical spondylosis without myelopathy 03/25/2014    Class: Chronic  . Left cervical radiculopathy 03/25/2014    Class: Chronic  . Cervical spondylosis with radiculopathy 03/25/2014   Starr Lake PT, DPT, LAT, ATC  12/04/16  11:45 AM      [facility] [address] [place], [place], [address] Phone: [phone]   Fax:  [phone]  Name: IRBY FAILS MRN: [id number] Date of Birth: 1965-07-20

## 2016-12-17 ENCOUNTER — Ambulatory Visit: Payer: BLUE CROSS/BLUE SHIELD | Attending: Specialist | Admitting: Physical Therapy

## 2016-12-17 DIAGNOSIS — R600 Localized edema: Secondary | ICD-10-CM | POA: Insufficient documentation

## 2016-12-17 DIAGNOSIS — M25662 Stiffness of left knee, not elsewhere classified: Secondary | ICD-10-CM | POA: Insufficient documentation

## 2016-12-17 DIAGNOSIS — M25562 Pain in left knee: Secondary | ICD-10-CM | POA: Diagnosis not present

## 2016-12-17 DIAGNOSIS — R262 Difficulty in walking, not elsewhere classified: Secondary | ICD-10-CM | POA: Diagnosis present

## 2016-12-17 NOTE — Therapy (Signed)
[place], [place], [address] Phone: [phone]   Fax:  [phone]  Physical Therapy Treatment  Patient Details  Name: Jesus Brown MRN: [id number] Date of Birth: [birth year] Referring Provider: Dr. Basil Dess  Encounter Date: 12/17/2016      PT End of Session - 12/17/16 1154    Visit Number 24   Number of Visits 30   Date for PT Re-Evaluation 12/24/16   PT Start Time 1150   PT Stop Time 1240   PT Time Calculation (min) 50 min   Activity Tolerance Patient tolerated treatment well   Behavior During Therapy [facility] for tasks assessed/performed      Past Medical History:  Diagnosis Date  . Anxiety    mainly r/t pain  . Arthritis    neck, knees  . Asthma    AS CHILD   . Back pain, chronic   . DDD (degenerative disc disease)   . Dysphagia   . 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 GERD (gastroesophageal reflux disease)   . Hypertension   . Hypotestosteronemia 09/08/2015  . Neck pain, chronic     Past Surgical History:  Procedure Laterality Date  . ANKLE SURGERY    . BACK SURGERY  2010,2011   lumb fusionx2  . CERVICAL FUSION  0076,2263   3 surgeries  . DIRECT LARYNGOSCOPY N/A 11/09/2012   Procedure: DIRECT LARYNGOSCOPY;  Surgeon: Jodi Marble, MD;  Location: [place];  Service: ENT;  Laterality: N/A;  . ESOPHAGOGASTRODUODENOSCOPY N/A 08/03/2012   Procedure: ESOPHAGOGASTRODUODENOSCOPY (EGD);  Surgeon: Arta Silence, MD;  Location: [facility] ENDOSCOPY;  Service: Endoscopy;  Laterality: N/A;  . ESOPHAGOSCOPY W/ BOTOX INJECTION N/A 11/09/2012   Procedure: ESOPHAGOSCOPY POSSIBLE BIOPSY AND POSSIBLE BOTOX INJECTION CRICOPHARYNGEUS;  Surgeon: Jodi Marble, MD;  Location: [place];  Service: ENT;  Laterality: N/A;  Esophagoscopy with botox injection  . HERNIA REPAIR     ing-rt  . KNEE SURGERY     rt and left x2 each=4  . POSTERIOR CERVICAL FUSION/FORAMINOTOMY  N/A 03/25/2014   Procedure: LEFT C7-T1 FORAMINOTOMY;  Surgeon: Jessy Oto, MD;  Location: [place];  Service: Orthopedics;  Laterality: N/A;  . [place]   rt and lt  . TONSILLECTOMY    . TOTAL KNEE ARTHROPLASTY Left 07/19/2016   Procedure: LEFT TOTAL KNEE ARTHROPLASTY CORTISON INJECTION IN RIGHT KNEE;  Surgeon: Jessy Oto, MD;  Location: [place];  Service: Orthopedics;  Laterality: Left;  MAY NEED RNFA TO FINISH CASE PER [place]   . WRIST SURGERY     lt and rt    There were no vitals filed for this visit.      Subjective Assessment - 12/17/16 1155    Subjective My knee is holding some fluid.  He had gout in both ankles over the weekend and both legs tight.  No pain in knee when i'm walking.  Just feels full. Sees Dr. Louanne Skye tomorrow.    Currently in Pain? No/denies            [facility] PT Assessment - 12/17/16 0001      Circumferential Edema   Circumferential - Right 15 inch    Circumferential - Left  16 inch      AROM   Left Knee Extension -8   Left Knee Flexion 120     Strength   Right Hip Flexion 4+/5   Right Hip Extension 4/5   Right Hip ABduction  4+/5   Left Hip Flexion 3+/5   Left Hip Extension 4/5   Left Hip ABduction 4-/5   Right Knee Flexion 4/5   Right Knee Extension 4+/5   Left Knee Flexion 4/5   Left Knee Extension 4+/5   Right Ankle Dorsiflexion 5/5   Left Ankle Dorsiflexion 4+/5            [facility] Adult PT Treatment/Exercise - 12/17/16 0001      Self-Care   Other Self-Care Comments  swelling, progress, ROM      Knee/Hip Exercises: Stretches   Active Hamstring Stretch Left;3 reps   Passive Hamstring Stretch Left;3 reps     Knee/Hip Exercises: Supine   Quad Sets Strengthening;Left;1 set;20 reps   Straight Leg Raises Strengthening;Left;1 set;20 reps   Other Supine Knee/Hip Exercises ankle pumps with elevation      Cryotherapy   Number Minutes Cryotherapy 10 Minutes   Cryotherapy Location Knee   Type of Cryotherapy Ice pack      Manual Therapy   Manual therapy comments retro massage for edema    Joint Mobilization patellar mob, Gr. I-II-III for extension    Passive ROM flex, ext    Muscle Energy Technique contract relax for  knee ext    Kinesiotex Edema     Kinesiotix   Edema 2 fans anterior knee for swelling                 PT Education - 12/17/16 1230    Education provided Yes   Education Details ROM and functional limitations and his "new normal"   Person(s) Educated Patient   Methods Explanation   Comprehension Verbalized understanding          PT Short Term Goals - 11/27/16 1126      PT SHORT TERM GOAL #1   Title Pt will be I with initial HEP for ROM and strength in L. knee    Status Achieved     PT SHORT TERM GOAL #2   Title Pt will be able to go without cane in his home and not aggravate back pain most of the time.    Status Achieved     PT SHORT TERM GOAL #3   Title Pt will achieve no more than 8 deg knee extension for improved gait, flexibility.    Status Partially Met           PT Long Term Goals - 12/17/16 1243      PT LONG TERM GOAL #1   Title Pt will understand RICE, posture and body mechanics as it pertains to knee and back pain.    Status Achieved     PT LONG TERM GOAL #2   Title Pt will be I with more advanced HEP for knee, core.    Status Achieved     PT LONG TERM GOAL #3   Title Pt will less than 55% limited on FOTO to demo functional improvement   Status On-going     PT LONG TERM GOAL #4   Title Pt will be able to go outside and participate in family functions without limitations of knee pain, including walking on uneven ground.    Baseline improved , gets flashes of pain in Lt thigh when on uneven ground.    Status On-going     PT LONG TERM GOAL #5   Title Pt will be able to walk for 30 min for general health and fitness without lasting knee pain    Status On-going  Plan - 12/17/16 1232    Clinical Impression Statement Patient  with continued limitations in knee ext ROM, lacking 8 deg from full.  AROM in flexion 120 deg.  Today knee was very tight, warm but not red.  Anterolateral knee swelling.  Wears JAS 20 min x 2 per day.  He is unable to knee and has severe pain after standing once sitting 20 min, eases with walking.  Walks for 15 min then pain increases mostly due to back pain.  Spoke to him today about accepting his current level of function as his new baseline, hopes to be able to get his Rt. knee done in Nov.    PT Next Visit Plan hamstring DN PRN, extension stretching, manual use Pilates Reformer or Tower, JAS 3 x per day, eccentric strengthening    PT Home Exercise Plan has HHPT level 1-2 ex, given assisted knee flexion, ITB stretch and hamstring as well today. , SLR with ER and hip adduction.    Consulted and Agree with Plan of Care Patient      Patient will benefit from skilled therapeutic intervention in order to improve the following deficits and impairments:  Difficulty walking, Increased fascial restricitons, Pain, Hypomobility, Decreased scar mobility, Impaired flexibility, Increased edema, Decreased strength, Decreased mobility  Visit Diagnosis: Acute pain of left knee  Difficulty in walking, not elsewhere classified  Localized edema  Stiffness of left knee, not elsewhere classified     Problem List Patient Active Problem List   Diagnosis Date Noted  . Status post total left knee replacement 09/26/2016  . Presence of left artificial knee joint 07/25/2016  . Pain and swelling of left lower leg 07/25/2016  . Bilateral primary osteoarthritis of knee 07/19/2016    Class: Chronic  . Primary localized osteoarthritis of left knee 07/19/2016  . 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 Hypotestosteronemia 09/08/2015  . Cervical spondylosis without myelopathy 03/25/2014    Class: Chronic  . Left cervical radiculopathy 03/25/2014    Class: Chronic  . Cervical spondylosis with  radiculopathy 03/25/2014    Jesus Brown 12/17/2016, 12:48 PM  [facility] [address] [place], [place], [address] Phone: [phone]   Fax:  [phone]  Name: Jesus Brown MRN: [id number] Date of Birth: [birth year]   Raeford Razor, PT 12/17/16 12:49 PM Phone: [phone] Fax: [phone]

## 2016-12-18 ENCOUNTER — Ambulatory Visit (INDEPENDENT_AMBULATORY_CARE_PROVIDER_SITE_OTHER): Payer: BLUE CROSS/BLUE SHIELD | Admitting: Specialist

## 2016-12-18 ENCOUNTER — Ambulatory Visit (INDEPENDENT_AMBULATORY_CARE_PROVIDER_SITE_OTHER): Payer: BLUE CROSS/BLUE SHIELD

## 2016-12-18 ENCOUNTER — Encounter (INDEPENDENT_AMBULATORY_CARE_PROVIDER_SITE_OTHER): Payer: Self-pay | Admitting: Specialist

## 2016-12-18 VITALS — BP 122/73 | HR 98 | Ht 68.0 in | Wt 230.0 lb

## 2016-12-18 DIAGNOSIS — M1711 Unilateral primary osteoarthritis, right knee: Secondary | ICD-10-CM | POA: Diagnosis not present

## 2016-12-18 DIAGNOSIS — M4322 Fusion of spine, cervical region: Secondary | ICD-10-CM | POA: Diagnosis not present

## 2016-12-18 DIAGNOSIS — Z96652 Presence of left artificial knee joint: Secondary | ICD-10-CM | POA: Diagnosis not present

## 2016-12-18 DIAGNOSIS — M961 Postlaminectomy syndrome, not elsewhere classified: Secondary | ICD-10-CM

## 2016-12-18 DIAGNOSIS — Z981 Arthrodesis status: Secondary | ICD-10-CM | POA: Diagnosis not present

## 2016-12-18 NOTE — Patient Instructions (Signed)
The patient history, physical examination and imaging studies are consistent with advanced degenerative joint disease of the right knee. The patient has failed conservative treatment.  The clearance notes were reviewed.  After discussion with the patient it was felt that Total Knee Replacement was indicated. The procedure,  risks, and benefits of total knee arthroplasty were presented and reviewed. The risks including but not limited to aseptic loosening, infection, blood clots, vascular and nerve injury, stiffness, patella tracking problems and fracture complications among others were discussed. The patient acknowledged the explanation, agreed to proceed with total knee replacement

## 2016-12-18 NOTE — Progress Notes (Signed)
Office Visit Note   Patient: Jesus Brown           Date of Birth: [birth year]           MRN: [id number] Visit Date: 12/18/2016              Requested by: Merrilee Seashore, [address] [place], [place] [address] PCP: Merrilee Seashore, MD   Assessment & Plan: Visit Diagnoses:  1. Primary osteoarthritis of right knee   2. S/P lumbar spinal fusion   3. Cervical vertebral fusion   4. History of left knee replacement   5. Lumbar post-laminectomy syndrome     Plan:The patient history, physical examination and imaging studies are consistent with advanced degenerative joint disease of the right knee. The patient has failed conservative treatment.  The clearance notes were reviewed.  After discussion with the patient it was felt that Total Knee Replacement was indicated. The procedure,  risks, and benefits of total knee arthroplasty were presented and reviewed. The risks including but not limited to aseptic loosening, infection, blood clots, vascular and nerve injury, stiffness, patella tracking problems and fracture complications among others were discussed. The patient acknowledged the explanation, agreed to proceed with total knee replacement  Follow-Up Instructions: Return in about 6 weeks (around 01/29/2017) for post op in 6 weeks.   Orders:  Orders Placed This Encounter  Procedures  . XR Knee 1-2 Views Right   No orders of the defined types were placed in this encounter.     Procedures: No procedures performed   Clinical Data: No additional findings.   Subjective: Chief Complaint  Patient presents with  . Left Knee - Follow-up    HPI  Review of Systems   Objective: Vital Signs: BP 122/73 (BP Location: Left Arm, Patient Position: Sitting)   Pulse 98   Ht 5\' 8"  (1.727 m)   Wt 230 lb (104.3 kg)   BMI 34.97 kg/m   Physical Exam  Ortho Exam  Specialty Comments:  No specialty comments available.  Imaging: No results found.   PMFS  History: Patient Active Problem List   Diagnosis Date Noted  . Bilateral primary osteoarthritis of knee 07/19/2016    Priority: High    Class: Chronic  . Cervical spondylosis without myelopathy 03/25/2014    Priority: High    Class: Chronic  . Left cervical radiculopathy 03/25/2014    Priority: High    Class: Chronic  . Status post total left knee replacement 09/26/2016  . Presence of left artificial knee joint 07/25/2016  . Pain and swelling of left lower leg 07/25/2016  . Primary localized osteoarthritis of left knee 07/19/2016  . 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 Hypotestosteronemia 09/08/2015  . Cervical spondylosis with radiculopathy 03/25/2014   Past Medical History:  Diagnosis Date  . Anxiety    mainly r/t pain  . Arthritis    neck, knees  . Asthma    AS CHILD   . Back pain, chronic   . DDD (degenerative disc disease)   . Dysphagia   . 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 GERD (gastroesophageal reflux disease)   . Hypertension   . Hypotestosteronemia 09/08/2015  . Neck pain, chronic     No family history on file.  Past Surgical History:  Procedure Laterality Date  . ANKLE SURGERY    . BACK SURGERY  2010,2011   lumb fusionx2  . CERVICAL FUSION  8366,2947   3 surgeries  . DIRECT  LARYNGOSCOPY N/A 11/09/2012   Procedure: DIRECT LARYNGOSCOPY;  Surgeon: Jodi Marble, MD;  Location: [place];  Service: ENT;  Laterality: N/A;  . ESOPHAGOGASTRODUODENOSCOPY N/A 08/03/2012   Procedure: ESOPHAGOGASTRODUODENOSCOPY (EGD);  Surgeon: Arta Silence, MD;  Location: [facility] ENDOSCOPY;  Service: Endoscopy;  Laterality: N/A;  . ESOPHAGOSCOPY W/ BOTOX INJECTION N/A 11/09/2012   Procedure: ESOPHAGOSCOPY POSSIBLE BIOPSY AND POSSIBLE BOTOX INJECTION CRICOPHARYNGEUS;  Surgeon: Jodi Marble, MD;  Location: [place];  Service: ENT;  Laterality: N/A;  Esophagoscopy with botox injection  . HERNIA REPAIR     ing-rt  . KNEE  SURGERY     rt and left x2 each=4  . POSTERIOR CERVICAL FUSION/FORAMINOTOMY N/A 03/25/2014   Procedure: LEFT C7-T1 FORAMINOTOMY;  Surgeon: Jessy Oto, MD;  Location: [place];  Service: Orthopedics;  Laterality: N/A;  . [place]   rt and lt  . TONSILLECTOMY    . TOTAL KNEE ARTHROPLASTY Left 07/19/2016   Procedure: LEFT TOTAL KNEE ARTHROPLASTY CORTISON INJECTION IN RIGHT KNEE;  Surgeon: Jessy Oto, MD;  Location: [place];  Service: Orthopedics;  Laterality: Left;  MAY NEED RNFA TO FINISH CASE PER [place]   . WRIST SURGERY     lt and rt   Social History   Occupational History  . Not on file.   Social History Main Topics  . Smoking status: Never Smoker  . Smokeless tobacco: Never Used  . Alcohol use No  . Drug use: No  . Sexual activity: Not on file

## 2016-12-25 ENCOUNTER — Ambulatory Visit: Payer: BLUE CROSS/BLUE SHIELD | Admitting: Physical Therapy

## 2016-12-25 DIAGNOSIS — R600 Localized edema: Secondary | ICD-10-CM

## 2016-12-25 DIAGNOSIS — R262 Difficulty in walking, not elsewhere classified: Secondary | ICD-10-CM

## 2016-12-25 DIAGNOSIS — M25562 Pain in left knee: Secondary | ICD-10-CM | POA: Diagnosis not present

## 2016-12-25 DIAGNOSIS — M25662 Stiffness of left knee, not elsewhere classified: Secondary | ICD-10-CM

## 2016-12-25 NOTE — Therapy (Signed)
[place], [place], [address] Phone: [phone]   Fax:  [phone]  Physical Therapy Treatment and Discharge  Patient Details  Name: Jesus Brown MRN: [id number] Date of Birth: Feb 24, 1966 Referring Provider: Dr. Basil Dess  Encounter Date: 12/25/2016      PT End of Session - 12/25/16 1139    Visit Number 25   Number of Visits 30   Date for PT Re-Evaluation 12/24/16   PT Start Time 1103   PT Stop Time 1145   PT Time Calculation (min) 42 min   Activity Tolerance Patient tolerated treatment well   Behavior During Therapy [facility] for tasks assessed/performed      Past Medical History:  Diagnosis Date  . Anxiety    mainly r/t pain  . Arthritis    neck, knees  . Asthma    AS CHILD   . Back pain, chronic   . DDD (degenerative disc disease)   . Dysphagia   . 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 GERD (gastroesophageal reflux disease)   . Hypertension   . Hypotestosteronemia 09/08/2015  . Neck pain, chronic     Past Surgical History:  Procedure Laterality Date  . ANKLE SURGERY    . BACK SURGERY  2010,2011   lumb fusionx2  . CERVICAL FUSION  2500,3704   3 surgeries  . DIRECT LARYNGOSCOPY N/A 11/09/2012   Procedure: DIRECT LARYNGOSCOPY;  Surgeon: Jodi Marble, MD;  Location: [place];  Service: ENT;  Laterality: N/A;  . ESOPHAGOGASTRODUODENOSCOPY N/A 08/03/2012   Procedure: ESOPHAGOGASTRODUODENOSCOPY (EGD);  Surgeon: Arta Silence, MD;  Location: [facility] ENDOSCOPY;  Service: Endoscopy;  Laterality: N/A;  . ESOPHAGOSCOPY W/ BOTOX INJECTION N/A 11/09/2012   Procedure: ESOPHAGOSCOPY POSSIBLE BIOPSY AND POSSIBLE BOTOX INJECTION CRICOPHARYNGEUS;  Surgeon: Jodi Marble, MD;  Location: [place];  Service: ENT;  Laterality: N/A;  Esophagoscopy with botox injection  . HERNIA REPAIR     ing-rt  . KNEE SURGERY     rt and left x2 each=4  . POSTERIOR CERVICAL  FUSION/FORAMINOTOMY N/A 03/25/2014   Procedure: LEFT C7-T1 FORAMINOTOMY;  Surgeon: Jessy Oto, MD;  Location: [place];  Service: Orthopedics;  Laterality: N/A;  . [place]   rt and lt  . TONSILLECTOMY    . TOTAL KNEE ARTHROPLASTY Left 07/19/2016   Procedure: LEFT TOTAL KNEE ARTHROPLASTY CORTISON INJECTION IN RIGHT KNEE;  Surgeon: Jessy Oto, MD;  Location: [place];  Service: Orthopedics;  Laterality: Left;  MAY NEED RNFA TO FINISH CASE PER [place]   . WRIST SURGERY     lt and rt    There were no vitals filed for this visit.      Subjective Assessment - 12/25/16 1108    Subjective Saw Dr. Louanne Skye, planning to have Rt. TKA end of October? This is my last day.     Currently in Pain? Yes   Pain Score 3    Pain Location Knee   Pain Orientation Left;Lateral   Pain Descriptors / Indicators Aching   Pain Onset More than a month ago   Pain Frequency Intermittent   Aggravating Factors  seated >15 min , stand, walk    Pain Relieving Factors heat, stretching             [facility] PT Assessment - 12/25/16 0001      AROM   Left Knee Extension -6     PROM   Overall PROM Comments -3  deg at best with manual overpressure      Strength   Right Hip Flexion 4+/5   Right Hip Extension 4/5   Right Hip ABduction 4+/5   Left Hip Flexion 3+/5   Left Hip Extension 4/5   Left Hip ABduction 4-/5   Right Knee Flexion 4/5   Right Knee Extension 4+/5   Left Knee Flexion 4/5   Left Knee Extension 4+/5   Right Ankle Dorsiflexion 5/5   Left Ankle Dorsiflexion 4+/5              [facility] Adult PT Treatment/Exercise - 12/25/16 0001      Knee/Hip Exercises: Supine   Quad Sets Strengthening;Left;1 set   Straight Leg Raises Strengthening;Left;1 set;10 reps   Straight Leg Raises Limitations 10 deg lag      Knee/Hip Exercises: Sidelying   Hip ABduction Strengthening;Left;1 set   Hip ADduction Strengthening;Left;1 set     Moist Heat Therapy   Number Minutes Moist Heat 15 Minutes   during manual    Moist Heat Location Knee     Manual Therapy   Joint Mobilization Gr. I-II-III extension    Soft tissue mobilization lateral quads, ITB    Myofascial Release L knee                 PT Education - 12/25/16 1138    Education provided Yes   Education Details final HEP and reminders to do daily   Person(s) Educated Patient   Methods Explanation   Comprehension Verbalized understanding          PT Short Term Goals - 12/25/16 1141      PT SHORT TERM GOAL #1   Title Pt will be I with initial HEP for ROM and strength in L. knee    Status Achieved     PT SHORT TERM GOAL #2   Title Pt will be able to go without cane in his home and not aggravate back pain most of the time.    Status Achieved     PT SHORT TERM GOAL #3   Title Pt will achieve no more than 8 deg knee extension for improved gait, flexibility.    Status Achieved           PT Long Term Goals - 12/25/16 1142      PT LONG TERM GOAL #1   Title Pt will understand RICE, posture and body mechanics as it pertains to knee and back pain.    Status Achieved     PT LONG TERM GOAL #2   Title Pt will be I with more advanced HEP for knee, core.    Status Achieved     PT LONG TERM GOAL #3   Title Pt will less than 55% limited on FOTO to demo functional improvement     PT LONG TERM GOAL #4   Title Pt will be able to go outside and participate in family functions without limitations of knee pain, including walking on uneven ground.    Status Partially Met     PT LONG TERM GOAL #5   Title Pt will be able to walk for 30 min for general health and fitness without lasting knee pain    Baseline 15 min    Status Partially Met               Plan - 12/25/16 1139    Clinical Impression Statement Able to get PROM to -3 deg after manual and with heat.  He plans to get his other knee done in the the next 1-2 mos.  FOTO score limited 63% (from 70% initially).  Patient feels he has improved a great  deal despite only 7% on FOTO.       He continues to be limited in his ability to stand, walk and sit for periods of time due to stiffness upon standing.  Most of his limitationsare due to back pain.     PT Next Visit Plan NA    PT Home Exercise Plan has HHPT level 1-2 ex, given assisted knee flexion, ITB stretch and hamstring as well today. , SLR with ER and hip adduction.    Consulted and Agree with Plan of Care Patient      Patient will benefit from skilled therapeutic intervention in order to improve the following deficits and impairments:  Difficulty walking, Increased fascial restricitons, Pain, Hypomobility, Decreased scar mobility, Impaired flexibility, Increased edema, Decreased strength, Decreased mobility  Visit Diagnosis: Acute pain of left knee  Difficulty in walking, not elsewhere classified  Localized edema  Stiffness of left knee, not elsewhere classified       G-Codes - 01/04/17 1146    Functional Assessment Tool Used (Outpatient Only) FOTO, clinical judgment    Functional Limitation Mobility: Walking and moving around   Mobility: Walking and Moving Around Current Status [phone]) At least 40 percent but less than 60 percent impaired, limited or restricted   Mobility: Walking and Moving Around Goal Status [phone]) At least 40 percent but less than 60 percent impaired, limited or restricted   Mobility: Walking and Moving Around Discharge Status [phone]) At least 40 percent but less than 60 percent impaired, limited or restricted      Problem List Patient Active Problem List   Diagnosis Date Noted  . Status post total left knee replacement 09/26/2016  . Presence of left artificial knee joint 07/25/2016  . Pain and swelling of left lower leg 07/25/2016  . Bilateral primary osteoarthritis of knee 07/19/2016    Class: Chronic  . Primary localized osteoarthritis of left knee 07/19/2016  . 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 Hypotestosteronemia  09/08/2015  . Cervical spondylosis without myelopathy 03/25/2014    Class: Chronic  . Left cervical radiculopathy 03/25/2014    Class: Chronic  . Cervical spondylosis with radiculopathy 03/25/2014    PAA,JENNIFER 01-04-2017, 1:20 PM  [facility] [address] [place], [place], [address] Phone: [phone]   Fax:  [phone]  Name: Jesus Brown MRN: [id number] Date of Birth: 02-05-1966   PHYSICAL THERAPY DISCHARGE SUMMARY  Visits from Start of Care: 25  Current functional level related to goals / functional outcomes: See above    Remaining deficits: Swelling, pain, ROM , back pain, hip and knee strength    Education / Equipment: HEP, gait, posture, lifting, dry needling   Plan: Patient agrees to discharge.  Patient goals were partially met. Patient is being discharged due to being pleased with the current functional level.  ?????     Raeford Razor, PT January 04, 2017 1:21 PM Phone: [phone] Fax: [phone]

## 2017-02-06 ENCOUNTER — Telehealth (INDEPENDENT_AMBULATORY_CARE_PROVIDER_SITE_OTHER): Payer: Self-pay

## 2017-02-06 NOTE — Telephone Encounter (Signed)
Patient left voice mail wanting to schedule knee surgery.  No surgery order

## 2017-02-06 NOTE — Telephone Encounter (Signed)
Patient left voice mail wanting to schedule knee surgery.  No surgery order.  [phone]

## 2017-02-10 NOTE — Telephone Encounter (Signed)
I called patient and scheduled surgery. 

## 2017-02-10 NOTE — Telephone Encounter (Signed)
Blue sheet filled out and sent to scheduling for total knee replacement.

## 2017-02-12 NOTE — Telephone Encounter (Signed)
This encounter was created in error - please disregard.

## 2017-03-06 ENCOUNTER — Other Ambulatory Visit (INDEPENDENT_AMBULATORY_CARE_PROVIDER_SITE_OTHER): Payer: Self-pay

## 2017-03-10 ENCOUNTER — Inpatient Hospital Stay (HOSPITAL_COMMUNITY)
Admission: RE | Admit: 2017-03-10 | Discharge: 2017-03-10 | Disposition: A | Discharge status: Home / Self Care | Payer: BLUE CROSS/BLUE SHIELD | Source: Ambulatory Visit

## 2017-03-10 NOTE — Pre-Procedure Instructions (Addendum)
Jesus Brown  03/10/2017      [id number] [place], [place] [address] [place] [address] Phone: [phone] Fax: [phone]    Your procedure is scheduled on Fri. Dec. 7  Report to [facility] Admitting at 10:30 A.M.  Call this number if you have problems the morning of surgery:  [phone]   Remember:  Do not eat food or drink liquids after midnight on Thurs, Dec. 6  Take these medicines the morning of surgery with A SIP OF WATER : flexeril if needed,claritin, ativan if needed,morphine,omeprazole (prilosec),  7 days prior to surgery STOP taking any Aspirin(unless otherwise instructed by your surgeon), Aleve, Naproxen, Ibuprofen, Motrin, Advil, Goody's, BC's, all herbal medications, fish oil, and all vitamins   Do not wear jewelry.  Do not wear lotions, powders, or perfumes, or deoderant.  Do not shave 48 hours prior to surgery.  Men may shave face and neck.  Do not bring valuables to the hospital.  [facility] is not responsible for any belongings or valuables.  Contacts, dentures or bridgework may not be worn into surgery.  Leave your suitcase in the car.  After surgery it may be brought to your room.  For patients admitted to the hospital, discharge time will be determined by your treatment team.  Patients discharged the day of surgery will not be allowed to drive home.    Special instructions:  Jesus Brown- Preparing For Surgery  Before surgery, you can play an important role. Because skin is not sterile, your skin needs to be as free of germs as possible. You can reduce the number of germs on your skin by washing with CHG (chlorahexidine gluconate) Soap before surgery.  CHG is an antiseptic cleaner which kills germs and bonds with the skin to continue killing germs even after washing.  Please do not use if you have an allergy to CHG or antibacterial soaps. If your skin  becomes reddened/irritated stop using the CHG.  Do not shave (including legs and underarms) for at least 48 hours prior to first CHG shower. It is OK to shave your face.  Please follow these instructions carefully.   1. Shower the NIGHT BEFORE SURGERY and the MORNING OF SURGERY with CHG.   2. If you chose to wash your hair, wash your hair first as usual with your normal shampoo.  3. After you shampoo, rinse your hair and body thoroughly to remove the shampoo.  4. Use CHG as you would any other liquid soap. You can apply CHG directly to the skin and wash gently with a scrungie or a clean washcloth.   5. Apply the CHG Soap to your body ONLY FROM THE NECK DOWN.  Do not use on open wounds or open sores. Avoid contact with your eyes, ears, mouth and genitals (private parts). Wash Face and genitals (private parts)  with your normal soap.  6. Wash thoroughly, paying special attention to the area where your surgery will be performed.  7. Thoroughly rinse your body with warm water from the neck down.  8. DO NOT shower/wash with your normal soap after using and rinsing off the CHG Soap.  9. Pat yourself dry with a CLEAN TOWEL.  10. Wear CLEAN PAJAMAS to bed the night before surgery, wear comfortable clothes the morning of surgery  11. Place CLEAN SHEETS on your bed the night of your first shower and DO NOT  SLEEP WITH PETS.    Day of Surgery: Do not apply any deodorants/lotions. Please wear clean clothes to the hospital/surgery center.      Please read over the following fact sheets that you were given. Coughing and Deep Breathing, Total Joint Packet and MRSA Information

## 2017-03-14 ENCOUNTER — Ambulatory Visit (HOSPITAL_COMMUNITY): Admission: RE | Admit: 2017-03-14 | Payer: BLUE CROSS/BLUE SHIELD | Source: Ambulatory Visit | Admitting: Specialist

## 2017-03-14 ENCOUNTER — Encounter (HOSPITAL_COMMUNITY): Admission: RE | Payer: Self-pay | Source: Ambulatory Visit

## 2017-03-14 SURGERY — ARTHROPLASTY, KNEE, TOTAL
Anesthesia: General | Laterality: Right

## 2017-03-24 ENCOUNTER — Encounter (INDEPENDENT_AMBULATORY_CARE_PROVIDER_SITE_OTHER): Payer: Self-pay | Admitting: Specialist

## 2017-03-24 ENCOUNTER — Ambulatory Visit (INDEPENDENT_AMBULATORY_CARE_PROVIDER_SITE_OTHER): Payer: BLUE CROSS/BLUE SHIELD | Admitting: Specialist

## 2017-03-24 VITALS — BP 116/72 | HR 96 | Ht 68.0 in | Wt 230.0 lb

## 2017-03-24 DIAGNOSIS — M1711 Unilateral primary osteoarthritis, right knee: Secondary | ICD-10-CM | POA: Diagnosis not present

## 2017-03-24 DIAGNOSIS — M7989 Other specified soft tissue disorders: Secondary | ICD-10-CM

## 2017-03-24 DIAGNOSIS — M79661 Pain in right lower leg: Secondary | ICD-10-CM

## 2017-03-24 NOTE — Patient Instructions (Signed)
Plan:  Knee is suffering from osteoarthritis, only real proven treatments are Weight loss, NSIADs like diclofenac and exercise. Well padded shoes help. Ice the knee 2-3 times a day 15-20 mins at a time. Obtain venous doppler test to rule out deep venous thrombosis.

## 2017-03-24 NOTE — Progress Notes (Addendum)
Office Visit Note   Patient: Jesus Brown           Date of Birth: Feb 08, 1966           MRN: [id number] Visit Date: 03/24/2017              Requested by: Merrilee Seashore, [address] [place], [place] [address] PCP: Merrilee Seashore, MD   Assessment & Plan: Visit Diagnoses:  1. Unilateral primary osteoarthritis, right knee   2. Pain and swelling of right lower leg   51 year old male with previous cervical and lumbar fusion surgeries, s/p left total knee replacement. He was scheduled for right total knee replacement 10 days ago but this was cancelled due to a right medial knee cellulitis and Possible septic thrombophlebitis. He is now 2 weeks post initiating antibiotics and clinically has  thickening of the soft tissue along the medial right knee. I have recommened a doppler venous duplex ultrasound to assess for a DVT as he will likely be rescheduled for the right TKR, if a DVT is present probably should have anticoagulation therapy prior to TKR. I will call with the results of the ultrasound and schedule TKR based  On the results of the doppler.   Plan:  Knee is suffering from osteoarthritis, only real proven treatments are Weight loss, NSIADs like diclofenac and exercise. Well padded shoes help. Ice the knee 2-3 times a day 15-20 mins at a time. Obtain venous doppler test to rule out deep venous thrombosis.  Follow-Up Instructions: No Follow-up on file.   Orders:  No orders of the defined types were placed in this encounter.  No orders of the defined types were placed in this encounter.     Procedures: No procedures performed   Clinical Data: No additional findings.   Subjective: Chief Complaint  Patient presents with  . Right Knee - Follow-up    51 year old male with history of previous left TKR 07/2016, he was scheduled for right knee replacement surgery when he began having right medial knee swelling, induration and tenderness. He has  been treated for a cellulitis and now returns about 2 week post the beginning of the knee infections.     Review of Systems  Constitutional: Negative.   HENT: Negative.   Eyes: Negative.   Respiratory: Negative.   Cardiovascular: Negative.   Gastrointestinal: Negative.   Endocrine: Negative.   Genitourinary: Negative.   Musculoskeletal: Negative.   Skin: Negative.   Allergic/Immunologic: Negative.   Neurological: Negative.   Hematological: Negative.   Psychiatric/Behavioral: Negative.      Objective: Vital Signs: BP 116/72 (BP Location: Left Arm, Patient Position: Sitting)   Pulse 96   Ht 5\' 8"  (1.727 m)   Wt 230 lb (104.3 kg)   BMI 34.97 kg/m   Physical Exam  Constitutional: He is oriented to person, place, and time. He appears well-developed and well-nourished.  HENT:  Head: Normocephalic and atraumatic.  Eyes: EOM are normal. Pupils are equal, round, and reactive to light.  Neck: Normal range of motion. Neck supple.  Pulmonary/Chest: Effort normal and breath sounds normal.  Abdominal: Soft. Bowel sounds are normal.  Musculoskeletal:       Right knee: He exhibits no effusion.  Neurological: He is alert and oriented to person, place, and time.  Skin: Skin is warm and dry.  Psychiatric: He has a normal mood and affect. His behavior is normal. Judgment and thought content normal.    Right Knee  Exam   Muscle Strength  The patient has normal right knee strength.  Tenderness  The patient is experiencing tenderness in the medial retinaculum and medial joint line.  Range of Motion  Extension:  -5 abnormal  Flexion:  120 abnormal   Tests  McMurray:  Medial - positive Lateral - negative Varus: positive Valgus: negative Lachman:  Anterior - negative    Posterior - negative Drawer:  Anterior - negative    Posterior - trace Pivot shift: negative Patellar apprehension: positive  Other  Erythema: present Scars: absent Sensation: normal Pulse: present Swelling:  mild Effusion: no effusion present  Comments:  Right knee with induration of the medial right knee in the area of the saphenous vein, there is a palpable cord in the area of the saphenous vein.   Left Knee Exam  Left knee exam is normal.      Specialty Comments:  No specialty comments available.  Imaging: No results found.   PMFS History: Patient Active Problem List   Diagnosis Date Noted  . Bilateral primary osteoarthritis of knee 07/19/2016    Priority: High    Class: Chronic  . Cervical spondylosis without myelopathy 03/25/2014    Priority: High    Class: Chronic  . Left cervical radiculopathy 03/25/2014    Priority: High    Class: Chronic  . Status post total left knee replacement 09/26/2016  . Presence of left artificial knee joint 07/25/2016  . Pain and swelling of left lower leg 07/25/2016  . Primary localized osteoarthritis of left knee 07/19/2016  . 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 Hypotestosteronemia 09/08/2015  . Cervical spondylosis with radiculopathy 03/25/2014   Past Medical History:  Diagnosis Date  . Anxiety    mainly r/t pain  . Arthritis    neck, knees  . Asthma    AS CHILD   . Back pain, chronic   . DDD (degenerative disc disease)   . Dysphagia   . 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 GERD (gastroesophageal reflux disease)   . Hypertension   . Hypotestosteronemia 09/08/2015  . Neck pain, chronic     History reviewed. No pertinent family history.  Past Surgical History:  Procedure Laterality Date  . ANKLE SURGERY    . BACK SURGERY  2010,2011   lumb fusionx2  . CERVICAL FUSION  9562,1308   3 surgeries  . DIRECT LARYNGOSCOPY N/A 11/09/2012   Procedure: DIRECT LARYNGOSCOPY;  Surgeon: Jodi Marble, MD;  Location: [place];  Service: ENT;  Laterality: N/A;  . ESOPHAGOGASTRODUODENOSCOPY N/A 08/03/2012   Procedure: ESOPHAGOGASTRODUODENOSCOPY (EGD);  Surgeon: Arta Silence, MD;   Location[facility] ENDOSCOPY;  Service: Endoscopy;  Laterality: N/A;  . ESOPHAGOSCOPY W/ BOTOX INJECTION N/A 11/09/2012   Procedure: ESOPHAGOSCOPY POSSIBLE BIOPSY AND POSSIBLE BOTOX INJECTION CRICOPHARYNGEUS;  Surgeon: Jodi Marble, MD;  Location: [place];  Service: ENT;  Laterality: N/A;  Esophagoscopy with botox injection  . HERNIA REPAIR     ing-rt  . KNEE SURGERY     rt and left x2 each=4  . POSTERIOR CERVICAL FUSION/FORAMINOTOMY N/A 03/25/2014   Procedure: LEFT C7-T1 FORAMINOTOMY;  Surgeon: Jessy Oto, MD;  Location: [place];  Service: Orthopedics;  Laterality: N/A;  . [place]   rt and lt  . TONSILLECTOMY    . TOTAL KNEE ARTHROPLASTY Left 07/19/2016   Procedure: LEFT TOTAL KNEE ARTHROPLASTY CORTISON INJECTION IN RIGHT KNEE;  Surgeon: Jessy Oto, MD;  Location: [place];  Service:  Orthopedics;  Laterality: Left;  MAY NEED RNFA TO FINISH CASE PER [place]   . WRIST SURGERY     lt and rt   Social History   Occupational History  . Not on file  Tobacco Use  . Smoking status: Never Smoker  . Smokeless tobacco: Never Used  Substance and Sexual Activity  . Alcohol use: No  . Drug use: No  . Sexual activity: Not on file

## 2017-03-25 ENCOUNTER — Ambulatory Visit (HOSPITAL_COMMUNITY)
Admission: RE | Admit: 2017-03-25 | Discharge: 2017-03-25 | Disposition: A | Discharge status: Home / Self Care | Payer: BLUE CROSS/BLUE SHIELD | Source: Ambulatory Visit | Attending: Specialist | Admitting: Specialist

## 2017-03-25 DIAGNOSIS — M79661 Pain in right lower leg: Secondary | ICD-10-CM | POA: Insufficient documentation

## 2017-03-25 DIAGNOSIS — M7989 Other specified soft tissue disorders: Secondary | ICD-10-CM | POA: Insufficient documentation

## 2017-03-25 NOTE — Progress Notes (Addendum)
Right lower extremity venous duplex has been completed. Negative for DVT. Palpable area of complex echogenicity of an unknown etiology is noted in the medial right knee.  Results were given to Dr. Louanne Skye.  03/25/17 10:59 AM Jesus Brown RVT

## 2017-03-28 ENCOUNTER — Inpatient Hospital Stay (INDEPENDENT_AMBULATORY_CARE_PROVIDER_SITE_OTHER): Payer: Medicare Other | Admitting: Specialist

## 2017-04-09 ENCOUNTER — Encounter (HOSPITAL_COMMUNITY): Payer: Self-pay

## 2017-04-09 ENCOUNTER — Encounter (HOSPITAL_COMMUNITY)
Admission: RE | Admit: 2017-04-09 | Discharge: 2017-04-09 | Disposition: A | Discharge status: Home / Self Care | Payer: BLUE CROSS/BLUE SHIELD | Source: Ambulatory Visit | Attending: Specialist | Admitting: Specialist

## 2017-04-09 ENCOUNTER — Other Ambulatory Visit: Payer: Self-pay

## 2017-04-09 ENCOUNTER — Other Ambulatory Visit (INDEPENDENT_AMBULATORY_CARE_PROVIDER_SITE_OTHER): Payer: Self-pay

## 2017-04-09 DIAGNOSIS — D751 Secondary polycythemia: Secondary | ICD-10-CM | POA: Diagnosis not present

## 2017-04-09 DIAGNOSIS — M479 Spondylosis, unspecified: Secondary | ICD-10-CM | POA: Diagnosis not present

## 2017-04-09 DIAGNOSIS — M4722 Other spondylosis with radiculopathy, cervical region: Secondary | ICD-10-CM | POA: Diagnosis not present

## 2017-04-09 DIAGNOSIS — Z91048 Other nonmedicinal substance allergy status: Secondary | ICD-10-CM | POA: Diagnosis not present

## 2017-04-09 DIAGNOSIS — K219 Gastro-esophageal reflux disease without esophagitis: Secondary | ICD-10-CM | POA: Diagnosis not present

## 2017-04-09 DIAGNOSIS — J45909 Unspecified asthma, uncomplicated: Secondary | ICD-10-CM | POA: Diagnosis not present

## 2017-04-09 DIAGNOSIS — M549 Dorsalgia, unspecified: Secondary | ICD-10-CM | POA: Diagnosis not present

## 2017-04-09 DIAGNOSIS — M170 Bilateral primary osteoarthritis of knee: Secondary | ICD-10-CM | POA: Diagnosis not present

## 2017-04-09 DIAGNOSIS — F419 Anxiety disorder, unspecified: Secondary | ICD-10-CM | POA: Diagnosis not present

## 2017-04-09 DIAGNOSIS — Z79899 Other long term (current) drug therapy: Secondary | ICD-10-CM | POA: Diagnosis not present

## 2017-04-09 DIAGNOSIS — Z6836 Body mass index (BMI) 36.0-36.9, adult: Secondary | ICD-10-CM | POA: Diagnosis not present

## 2017-04-09 DIAGNOSIS — Z981 Arthrodesis status: Secondary | ICD-10-CM | POA: Diagnosis not present

## 2017-04-09 DIAGNOSIS — E669 Obesity, unspecified: Secondary | ICD-10-CM | POA: Diagnosis not present

## 2017-04-09 DIAGNOSIS — M1711 Unilateral primary osteoarthritis, right knee: Secondary | ICD-10-CM | POA: Diagnosis present

## 2017-04-09 DIAGNOSIS — G8929 Other chronic pain: Secondary | ICD-10-CM | POA: Diagnosis not present

## 2017-04-09 DIAGNOSIS — Z7982 Long term (current) use of aspirin: Secondary | ICD-10-CM | POA: Diagnosis not present

## 2017-04-09 DIAGNOSIS — Z96652 Presence of left artificial knee joint: Secondary | ICD-10-CM | POA: Diagnosis not present

## 2017-04-09 DIAGNOSIS — R1310 Dysphagia, unspecified: Secondary | ICD-10-CM | POA: Diagnosis not present

## 2017-04-09 DIAGNOSIS — E291 Testicular hypofunction: Secondary | ICD-10-CM | POA: Diagnosis not present

## 2017-04-09 DIAGNOSIS — Z888 Allergy status to other drugs, medicaments and biological substances status: Secondary | ICD-10-CM | POA: Diagnosis not present

## 2017-04-09 DIAGNOSIS — I10 Essential (primary) hypertension: Secondary | ICD-10-CM | POA: Diagnosis not present

## 2017-04-09 LAB — COMPREHENSIVE METABOLIC PANEL
ALBUMIN: 4 g/dL (ref 3.5–5.0)
ALK PHOS: 79 U/L (ref 38–126)
ALT: 38 U/L (ref 17–63)
ANION GAP: 7 (ref 5–15)
AST: 33 U/L (ref 15–41)
BUN: 14 mg/dL (ref 6–20)
CALCIUM: 9.2 mg/dL (ref 8.9–10.3)
CO2: 28 mmol/L (ref 22–32)
Chloride: 99 mmol/L — ABNORMAL LOW (ref 101–111)
Creatinine, Ser: 0.97 mg/dL (ref 0.61–1.24)
GFR calc Af Amer: >60 mL/min (ref >60)
GFR calc non Af Amer: >60 mL/min (ref >60)
GLUCOSE: 100 mg/dL — AB (ref 65–99)
Potassium: 3.3 mmol/L — ABNORMAL LOW (ref 3.5–5.1)
SODIUM: 134 mmol/L — AB (ref 135–145)
Total Bilirubin: 0.8 mg/dL (ref 0.3–1.2)
Total Protein: 7.1 g/dL (ref 6.5–8.1)

## 2017-04-09 LAB — CBC
HCT: 47.5 % (ref 39.0–52.0)
HEMOGLOBIN: 15.6 g/dL (ref 13.0–17.0)
MCH: 25.8 pg — ABNORMAL LOW (ref 26.0–34.0)
MCHC: 32.8 g/dL (ref 30.0–36.0)
MCV: 78.5 fL (ref 78.0–100.0)
Platelets: 248 10*3/uL (ref 150–400)
RBC: 6.05 MIL/uL — AB (ref 4.22–5.81)
RDW: 15.5 % (ref 11.5–15.5)
WBC: 10.5 10*3/uL (ref 4.0–10.5)

## 2017-04-09 LAB — URINALYSIS, ROUTINE W REFLEX MICROSCOPIC
Bilirubin Urine: NEGATIVE
Glucose, UA: NEGATIVE mg/dL
Hgb urine dipstick: NEGATIVE
Ketones, ur: NEGATIVE mg/dL
Leukocytes, UA: NEGATIVE
Nitrite: NEGATIVE
Protein, ur: NEGATIVE mg/dL
SPECIFIC GRAVITY, URINE: 1.017 (ref 1.005–1.030)
pH: 6 (ref 5.0–8.0)

## 2017-04-09 LAB — APTT: APTT: 29 s (ref 24–36)

## 2017-04-09 LAB — PROTIME-INR
INR: 0.94
Prothrombin Time: 12.5 seconds (ref 11.4–15.2)

## 2017-04-09 NOTE — Pre-Procedure Instructions (Signed)
[facility]  04/09/2017      [id number] - [place], [place] - [place] [address] [place] [address] Phone: [phone] Fax: [phone]    Your procedure is scheduled on Jan 4  Report to [place] at 1040 A.M.  Call this number if you have problems the morning of surgery:  [phone]   Remember:  Do not eat food or drink liquids after midnight.  Take these medicines the morning of surgery with A SIP OF WATER Loratadine (Claritin), Lorazepam (Ativan) if needed, Morphine (kadian), Morphine (MSIR) if needed, Omeprazole (Prilosec)  Stop taking aspirin, BC's, Goody's, Herbal medications, Fish Oil, Aleve, Ibuprofen, Advil, motrin   Do not wear jewelry, make-up or nail polish.  Do not wear lotions, powders, or perfumes, or deodorant.  Do not shave 48 hours prior to surgery.  Men may shave face and neck.  Do not bring valuables to the hospital.  [facility] is not responsible for any belongings or valuables.  Contacts, dentures or bridgework may not be worn into surgery.  Leave your suitcase in the car.  After surgery it may be brought to your room.  For patients admitted to the hospital, discharge time will be determined by your treatment team.  Patients discharged the day of surgery will not be allowed to drive home.   Special instructions:  [place] - Preparing for Surgery  Before surgery, you can play an important role.  Because skin is not sterile, your skin needs to be as free of germs as possible.  You can reduce the number of germs on you skin by washing with CHG (chlorahexidine gluconate) soap before surgery.  CHG is an antiseptic cleaner which kills germs and bonds with the skin to continue killing germs even after washing.  Please DO NOT use if you have an allergy to CHG or antibacterial soaps.  If your skin becomes reddened/irritated stop using the CHG and inform your  nurse when you arrive at Short Stay.  Do not shave (including legs and underarms) for at least 48 hours prior to the first CHG shower.  You may shave your face.  Please follow these instructions carefully:   1.  Shower with CHG Soap the night before surgery and the   morning of Surgery.  2.  If you choose to wash your hair, wash your hair first as usual with your normal shampoo.  3.  After you shampoo, rinse your hair and body thoroughly to remove the  Shampoo.  4.  Use CHG as you would any other liquid soap.  You can apply chg directly   to the skin and wash gently with scrungie or a clean washcloth.  5.  Apply the CHG Soap to your body ONLY FROM THE NECK DOWN.  Do not use on open wounds or open sores.  Avoid contact with your eyes, ears, mouth and genitals (private parts).  Wash genitals (private parts)   with your normal soap.  6.  Wash thoroughly, paying special attention to the area where your surgery will be performed.  7.  Thoroughly rinse your body with warm water from the neck down.  8.  DO NOT shower/wash with your normal soap after using and rinsing off the CHG Soap.  9.  Pat yourself dry with a clean towel.            10.  Wear clean pajamas.  11.  Place clean sheets on your bed the night of your first shower and do not sleep with pets.  Day of Surgery  Do not apply any lotions/deoderants the morning of surgery.  Please wear clean clothes to the hospital/surgery center.     Please read over the following fact sheets that you were given. Pain Booklet, Coughing and Deep Breathing, MRSA Information and Surgical Site Infection Prevention

## 2017-04-09 NOTE — Progress Notes (Signed)
PCP is Dr. Fredirick Maudlin Denies ever seeing a cardiologist. Denies ever having a card cath.  States he had a stress test 15-20 years ago Denies chest pain, cough, or fever.

## 2017-04-09 NOTE — Progress Notes (Signed)
   04/09/17 1516  OBSTRUCTIVE SLEEP APNEA  Score 5 or greater  Results sent to PCP

## 2017-04-10 LAB — SURGICAL PCR SCREEN
MRSA, PCR: POSITIVE — AB
Staphylococcus aureus: POSITIVE — AB

## 2017-04-10 NOTE — H&P (Signed)
TOTAL KNEE ADMISSION H&P  Patient is being admitted for right total knee arthroplasty.  Subjective:  Chief Complaint:right knee pain.  HPI: Jesus Brown, 52 y.o. male, has a history of pain and functional disability in the right knee due to arthritis and has failed non-surgical conservative treatments for greater than 12 weeks to includecorticosteriod injections, use of assistive devices and activity modification.  Onset of symptoms was gradual, starting 10 years ago with gradually worsening course since that time.  Patient currently rates pain in the right knee(s) at 10 out of 10 with activity. Patient has night pain, worsening of pain with activity and weight bearing, pain that interferes with activities of daily living, crepitus and joint swelling.  Patient has evidence of subchondral sclerosis, periarticular osteophytes and joint space narrowing by imaging studies.  There is no active infection.  Patient Active Problem List   Diagnosis Date Noted  . Status post total left knee replacement 09/26/2016  . Presence of left artificial knee joint 07/25/2016  . Pain and swelling of left lower leg 07/25/2016  . Bilateral primary osteoarthritis of knee 07/19/2016    Class: Chronic  . Primary localized osteoarthritis of left knee 07/19/2016  . 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 Hypotestosteronemia 09/08/2015  . Cervical spondylosis without myelopathy 03/25/2014    Class: Chronic  . Left cervical radiculopathy 03/25/2014    Class: Chronic  . Cervical spondylosis with radiculopathy 03/25/2014   Past Medical History:  Diagnosis Date  . Anxiety    mainly r/t pain  . Arthritis    neck, knees  . Asthma    AS CHILD   . Back pain, chronic   . DDD (degenerative disc disease)   . Dysphagia   . 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 GERD (gastroesophageal reflux disease)   . Hypertension   . Hypotestosteronemia 09/08/2015  . Neck pain, chronic     Past  Surgical History:  Procedure Laterality Date  . ANKLE SURGERY    . BACK SURGERY  2010,2011   lumb fusionx2  . CERVICAL FUSION  [id number]   3 surgeries  . DIRECT LARYNGOSCOPY N/A 11/09/2012   Procedure: DIRECT LARYNGOSCOPY;  Surgeon: Jodi Marble, MD;  Location: [facility];  Service: ENT;  Laterality: N/A;  . ESOPHAGOGASTRODUODENOSCOPY N/A 08/03/2012   Procedure: ESOPHAGOGASTRODUODENOSCOPY (EGD);  Surgeon: Arta Silence, MD;  Location: [facility] ENDOSCOPY;  Service: Endoscopy;  Laterality: N/A;  . ESOPHAGOSCOPY W/ BOTOX INJECTION N/A 11/09/2012   Procedure: ESOPHAGOSCOPY POSSIBLE BIOPSY AND POSSIBLE BOTOX INJECTION CRICOPHARYNGEUS;  Surgeon: Jodi Marble, MD;  Location: [facility];  Service: ENT;  Laterality: N/A;  Esophagoscopy with botox injection  . HERNIA REPAIR     ing-rt  . KNEE SURGERY     rt and left x2 each=4  . POSTERIOR CERVICAL FUSION/FORAMINOTOMY N/A 03/25/2014   Procedure: LEFT C7-T1 FORAMINOTOMY;  Surgeon: Jessy Oto, MD;  Location: [facility];  Service: Orthopedics;  Laterality: N/A;  . [place]   rt and lt  . TONSILLECTOMY    . TOTAL KNEE ARTHROPLASTY Left 07/19/2016   Procedure: LEFT TOTAL KNEE ARTHROPLASTY CORTISON INJECTION IN RIGHT KNEE;  Surgeon: Jessy Oto, MD;  Location: [facility];  Service: Orthopedics;  Laterality: Left;  MAY NEED RNFA TO FINISH CASE PER [place]   . WRIST SURGERY     lt and rt    No current facility-administered medications for this encounter.    Current Outpatient Medications  Medication Sig Dispense  Refill Last Dose  . Aspirin-Acetaminophen-Caffeine (GOODY HEADACHE PO) Take 1 packet by mouth daily.    Taking  . EPINEPHrine (EPIPEN 2-PAK) 0.3 mg/0.3 mL IJ SOAJ injection Inject 0.3 mg into the muscle as needed (allergic reaction).   Taking  . hydrochlorothiazide (HYDRODIURIL) 25 MG tablet Take 25 mg by mouth daily.   0 Taking  . loratadine (CLARITIN) 10 MG tablet Take 10 mg by mouth daily.   Taking  .  LORazepam (ATIVAN) 0.5 MG tablet Take 0.5 mg by mouth daily as needed for anxiety.    Taking  . morphine (KADIAN) 60 MG 24 hr capsule Take 60 mg by mouth every morning.  0 Taking  . morphine (MSIR) 15 MG tablet Take 1 tablet (15 mg total) by mouth every 4 (four) hours as needed (pai). (Patient taking differently: Take 15 mg by mouth 3 (three) times daily as needed for moderate pain. ) 60 tablet 0 Taking  . omeprazole (PRILOSEC) 20 MG capsule Take 20 mg by mouth daily.    Taking  . PRESCRIPTION MEDICATION Inject 2 Doses as directed once a week. Allergy shots   Taking  . Testosterone (ANDROGEL) 20.25 MG/1.25GM (1.62%) GEL Place 2 Pump onto the skin daily.    Taking  . valsartan (DIOVAN) 160 MG tablet Take 160 mg by mouth daily.   0 Taking   Allergies  Allergen Reactions  . Baclofen Nausea Only and Other (See Comments)    Aches and pains   . Indocin [Indomethacin] Other (See Comments)    Burns stomach  . Adhesive [Tape] Other (See Comments)    Blisters, NO STERI STRIPS  paper tape is ok    Social History   Tobacco Use  . Smoking status: Never Smoker  . Smokeless tobacco: Never Used  Substance Use Topics  . Alcohol use: No    No family history on file.   Review of Systems  Constitutional: Negative.   HENT: Negative.   Eyes: Negative.   Respiratory: Negative.   Cardiovascular: Negative.   Gastrointestinal: Negative.   Genitourinary: Negative.   Musculoskeletal: Positive for joint pain.  Skin: Negative.   Neurological: Negative.   Psychiatric/Behavioral: Negative.     Objective:  Physical Exam  Constitutional: He is oriented to person, place, and time. He appears well-developed. No distress.  HENT:  Head: Normocephalic and atraumatic.  Eyes: EOM are normal. Pupils are equal, round, and reactive to light.  Neck: Normal range of motion.  Respiratory: No respiratory distress.  GI: He exhibits no distension.  Musculoskeletal: He exhibits tenderness.  Neurological: He is  alert and oriented to person, place, and time.  Skin: Skin is warm and dry.  Psychiatric: He has a normal mood and affect.    Vital signs in last 24 hours:    Labs:   Estimated body mass index is 36.6 kg/m as calculated from the following:   Height as of 04/09/17: 5\' 8"  (1.727 m).   Weight as of 04/09/17: 240 lb 11.2 oz (109.2 kg).   Imaging Review Plain radiographs demonstrate moderate degenerative joint disease of the right knee(s). The overall alignment ismild varus. The bone quality appears to be good for age and reported activity level.  Assessment/Plan:  End stage arthritis, right knee   The patient history, physical examination, clinical judgment of the provider and imaging studies are consistent with end stage degenerative joint disease of the right knee(s) and total knee arthroplasty is deemed medically necessary. The treatment options including medical management, injection therapy arthroscopy  and arthroplasty were discussed at length. The risks and benefits of total knee arthroplasty were presented and reviewed. The risks due to aseptic loosening, infection, stiffness, patella tracking problems, thromboembolic complications and other imponderables were discussed. The patient acknowledged the explanation, agreed to proceed with the plan and consent was signed. Patient is being admitted for inpatient treatment for surgery, pain control, PT, OT, prophylactic antibiotics, VTE prophylaxis, progressive ambulation and ADL's and discharge planning. The patient is planning to be discharged home with home health services

## 2017-04-10 NOTE — Progress Notes (Signed)
Left message at Dr. Otho Ket office Dondra Spry) to see if antibiotic needed to be changed to Vancomycin since patient's PCR was positive for MRSA.

## 2017-04-11 ENCOUNTER — Encounter (HOSPITAL_COMMUNITY): Payer: Self-pay | Admitting: *Deleted

## 2017-04-11 ENCOUNTER — Ambulatory Visit (HOSPITAL_COMMUNITY): Payer: BLUE CROSS/BLUE SHIELD | Admitting: Certified Registered Nurse Anesthetist

## 2017-04-11 ENCOUNTER — Other Ambulatory Visit: Payer: Self-pay

## 2017-04-11 ENCOUNTER — Ambulatory Visit (HOSPITAL_COMMUNITY)
Admission: RE | Admit: 2017-04-11 | Discharge: 2017-04-12 | Disposition: A | Discharge status: Home / Self Care | Payer: BLUE CROSS/BLUE SHIELD | Source: Ambulatory Visit | Attending: Specialist | Admitting: Specialist

## 2017-04-11 ENCOUNTER — Encounter (HOSPITAL_COMMUNITY)
Admission: RE | Disposition: A | Discharge status: Home / Self Care | Payer: Self-pay | Source: Ambulatory Visit | Attending: Specialist

## 2017-04-11 DIAGNOSIS — Z7982 Long term (current) use of aspirin: Secondary | ICD-10-CM | POA: Insufficient documentation

## 2017-04-11 DIAGNOSIS — E291 Testicular hypofunction: Secondary | ICD-10-CM | POA: Insufficient documentation

## 2017-04-11 DIAGNOSIS — Z6836 Body mass index (BMI) 36.0-36.9, adult: Secondary | ICD-10-CM | POA: Insufficient documentation

## 2017-04-11 DIAGNOSIS — I10 Essential (primary) hypertension: Secondary | ICD-10-CM | POA: Insufficient documentation

## 2017-04-11 DIAGNOSIS — Z888 Allergy status to other drugs, medicaments and biological substances status: Secondary | ICD-10-CM | POA: Insufficient documentation

## 2017-04-11 DIAGNOSIS — M170 Bilateral primary osteoarthritis of knee: Secondary | ICD-10-CM | POA: Insufficient documentation

## 2017-04-11 DIAGNOSIS — M1711 Unilateral primary osteoarthritis, right knee: Secondary | ICD-10-CM

## 2017-04-11 DIAGNOSIS — J45909 Unspecified asthma, uncomplicated: Secondary | ICD-10-CM | POA: Insufficient documentation

## 2017-04-11 DIAGNOSIS — M549 Dorsalgia, unspecified: Secondary | ICD-10-CM | POA: Insufficient documentation

## 2017-04-11 DIAGNOSIS — Z79899 Other long term (current) drug therapy: Secondary | ICD-10-CM | POA: Insufficient documentation

## 2017-04-11 DIAGNOSIS — M479 Spondylosis, unspecified: Secondary | ICD-10-CM | POA: Insufficient documentation

## 2017-04-11 DIAGNOSIS — M4722 Other spondylosis with radiculopathy, cervical region: Secondary | ICD-10-CM | POA: Insufficient documentation

## 2017-04-11 DIAGNOSIS — D751 Secondary polycythemia: Secondary | ICD-10-CM | POA: Insufficient documentation

## 2017-04-11 DIAGNOSIS — F419 Anxiety disorder, unspecified: Secondary | ICD-10-CM | POA: Insufficient documentation

## 2017-04-11 DIAGNOSIS — Z96651 Presence of right artificial knee joint: Secondary | ICD-10-CM

## 2017-04-11 DIAGNOSIS — Z91048 Other nonmedicinal substance allergy status: Secondary | ICD-10-CM | POA: Insufficient documentation

## 2017-04-11 DIAGNOSIS — Z96652 Presence of left artificial knee joint: Secondary | ICD-10-CM | POA: Insufficient documentation

## 2017-04-11 DIAGNOSIS — R1310 Dysphagia, unspecified: Secondary | ICD-10-CM | POA: Insufficient documentation

## 2017-04-11 DIAGNOSIS — G8929 Other chronic pain: Secondary | ICD-10-CM | POA: Insufficient documentation

## 2017-04-11 DIAGNOSIS — K219 Gastro-esophageal reflux disease without esophagitis: Secondary | ICD-10-CM | POA: Insufficient documentation

## 2017-04-11 DIAGNOSIS — E669 Obesity, unspecified: Secondary | ICD-10-CM | POA: Insufficient documentation

## 2017-04-11 DIAGNOSIS — Z981 Arthrodesis status: Secondary | ICD-10-CM | POA: Insufficient documentation

## 2017-04-11 HISTORY — PX: TOTAL KNEE ARTHROPLASTY: SHX125

## 2017-04-11 SURGERY — ARTHROPLASTY, KNEE, TOTAL
Anesthesia: Regional | Laterality: Right

## 2017-04-11 MED ORDER — EPINEPHRINE 0.3 MG/0.3ML IJ SOAJ
0.3000 mg | INTRAMUSCULAR | Status: DC | PRN
  Filled 2017-04-11: qty 0.3

## 2017-04-11 MED ORDER — MORPHINE SULFATE ER 15 MG PO TBCR
15.0000 mg | EXTENDED_RELEASE_TABLET | Freq: Two times a day (BID) | ORAL | Status: DC
Start: 2017-04-11 — End: 2017-04-11

## 2017-04-11 MED ORDER — CEFAZOLIN SODIUM-DEXTROSE 2-4 GM/100ML-% IV SOLN
INTRAVENOUS | Status: AC
  Filled 2017-04-11: qty 100

## 2017-04-11 MED ORDER — HYDROMORPHONE HCL 1 MG/ML IJ SOLN
INTRAMUSCULAR | Status: AC
  Administered 2017-04-11: 0.5 mg via INTRAVENOUS
  Filled 2017-04-11: qty 1

## 2017-04-11 MED ORDER — ONDANSETRON HCL 4 MG PO TABS: 4.0000 mg | ORAL_TABLET | Freq: Four times a day (QID) | ORAL | Status: DC | PRN

## 2017-04-11 MED ORDER — ROPIVACAINE HCL 5 MG/ML IJ SOLN
INTRAMUSCULAR | Status: DC | PRN
  Administered 2017-04-11: 20 mL via PERINEURAL

## 2017-04-11 MED ORDER — MIDAZOLAM HCL 2 MG/2ML IJ SOLN
2.0000 mg | Freq: Once | INTRAMUSCULAR | Status: AC
  Administered 2017-04-11: 2 mg via INTRAVENOUS

## 2017-04-11 MED ORDER — VANCOMYCIN HCL IN DEXTROSE 1-5 GM/200ML-% IV SOLN
INTRAVENOUS | Status: AC
  Administered 2017-04-11: 1000 mg via INTRAVENOUS
  Filled 2017-04-11: qty 200

## 2017-04-11 MED ORDER — ONDANSETRON HCL 4 MG/2ML IJ SOLN
INTRAMUSCULAR | Status: AC
  Filled 2017-04-11: qty 6

## 2017-04-11 MED ORDER — LACTATED RINGERS IV SOLN
INTRAVENOUS | Status: DC
  Administered 2017-04-11 (×2): via INTRAVENOUS

## 2017-04-11 MED ORDER — MORPHINE SULFATE ER 60 MG PO CP24: 60.0000 mg | ORAL_CAPSULE | ORAL | Status: DC

## 2017-04-11 MED ORDER — METOCLOPRAMIDE HCL 5 MG/ML IJ SOLN: 5.0000 mg | Freq: Three times a day (TID) | INTRAMUSCULAR | Status: DC | PRN

## 2017-04-11 MED ORDER — MIDAZOLAM HCL 2 MG/2ML IJ SOLN
INTRAMUSCULAR | Status: AC
  Administered 2017-04-11: 2 mg via INTRAVENOUS
  Filled 2017-04-11: qty 2

## 2017-04-11 MED ORDER — DEXAMETHASONE SODIUM PHOSPHATE 10 MG/ML IJ SOLN
INTRAMUSCULAR | Status: DC | PRN
  Administered 2017-04-11: 5 mg via INTRAVENOUS

## 2017-04-11 MED ORDER — PROMETHAZINE HCL 25 MG/ML IJ SOLN
INTRAMUSCULAR | Status: AC
  Filled 2017-04-11: qty 1

## 2017-04-11 MED ORDER — ACETAMINOPHEN 650 MG RE SUPP: 650.0000 mg | RECTAL | Status: DC | PRN

## 2017-04-11 MED ORDER — FENTANYL CITRATE (PF) 250 MCG/5ML IJ SOLN
INTRAMUSCULAR | Status: AC
  Filled 2017-04-11: qty 5

## 2017-04-11 MED ORDER — TRANEXAMIC ACID 1000 MG/10ML IV SOLN
1000.0000 mg | INTRAVENOUS | Status: AC
  Administered 2017-04-11: 1000 mg via INTRAVENOUS
  Filled 2017-04-11: qty 10

## 2017-04-11 MED ORDER — TESTOSTERONE 50 MG/5GM (1%) TD GEL
4.0000 g | Freq: Every day | TRANSDERMAL | Status: DC
  Administered 2017-04-12: 4 g via TRANSDERMAL
  Filled 2017-04-11: qty 5

## 2017-04-11 MED ORDER — MORPHINE SULFATE 30 MG PO TABS: 30.0000 mg | ORAL_TABLET | ORAL | Status: DC | PRN

## 2017-04-11 MED ORDER — VANCOMYCIN HCL IN DEXTROSE 1-5 GM/200ML-% IV SOLN
1000.0000 mg | INTRAVENOUS | Status: AC
  Administered 2017-04-11: 1000 mg via INTRAVENOUS

## 2017-04-11 MED ORDER — PHENOL 1.4 % MT LIQD: 1.0000 | OROMUCOSAL | Status: DC | PRN

## 2017-04-11 MED ORDER — DOCUSATE SODIUM 100 MG PO CAPS
100.0000 mg | ORAL_CAPSULE | Freq: Two times a day (BID) | ORAL | Status: DC
  Administered 2017-04-11 – 2017-04-12 (×2): 100 mg via ORAL
  Filled 2017-04-11 (×2): qty 1

## 2017-04-11 MED ORDER — ASPIRIN EC 325 MG PO TBEC
325.0000 mg | DELAYED_RELEASE_TABLET | Freq: Every day | ORAL | Status: DC
  Administered 2017-04-12: 325 mg via ORAL
  Filled 2017-04-11: qty 1

## 2017-04-11 MED ORDER — OXYCODONE HCL 5 MG PO TABS: 5.0000 mg | ORAL_TABLET | Freq: Once | ORAL | Status: DC | PRN

## 2017-04-11 MED ORDER — ESMOLOL HCL 100 MG/10ML IV SOLN
INTRAVENOUS | Status: DC | PRN
  Administered 2017-04-11: 50 mg via INTRAVENOUS
  Administered 2017-04-11: 30 mg via INTRAVENOUS

## 2017-04-11 MED ORDER — IRBESARTAN 150 MG PO TABS
150.0000 mg | ORAL_TABLET | Freq: Every day | ORAL | Status: DC
  Administered 2017-04-11 – 2017-04-12 (×2): 150 mg via ORAL
  Filled 2017-04-11 (×2): qty 1

## 2017-04-11 MED ORDER — ONDANSETRON HCL 4 MG/2ML IJ SOLN: 4.0000 mg | Freq: Four times a day (QID) | INTRAMUSCULAR | Status: DC | PRN

## 2017-04-11 MED ORDER — HYDROCHLOROTHIAZIDE 25 MG PO TABS
25.0000 mg | ORAL_TABLET | Freq: Every day | ORAL | Status: DC
  Administered 2017-04-11 – 2017-04-12 (×2): 25 mg via ORAL
  Filled 2017-04-11 (×2): qty 1

## 2017-04-11 MED ORDER — DIPHENHYDRAMINE HCL 25 MG PO CAPS: 50.0000 mg | ORAL_CAPSULE | Freq: Three times a day (TID) | ORAL | Status: DC | PRN

## 2017-04-11 MED ORDER — EPHEDRINE 5 MG/ML INJ
INTRAVENOUS | Status: AC
  Filled 2017-04-11: qty 20

## 2017-04-11 MED ORDER — CEFAZOLIN SODIUM-DEXTROSE 2-3 GM-%(50ML) IV SOLR
INTRAVENOUS | Status: DC | PRN
  Administered 2017-04-11: 2 g via INTRAVENOUS

## 2017-04-11 MED ORDER — BUPIVACAINE HCL (PF) 0.5 % IJ SOLN
INTRAMUSCULAR | Status: AC
  Filled 2017-04-11: qty 30

## 2017-04-11 MED ORDER — BUPIVACAINE LIPOSOME 1.3 % IJ SUSP
20.0000 mL | INTRAMUSCULAR | Status: DC
  Filled 2017-04-11: qty 20

## 2017-04-11 MED ORDER — LORAZEPAM 0.5 MG PO TABS: 0.5000 mg | ORAL_TABLET | Freq: Every day | ORAL | Status: DC | PRN

## 2017-04-11 MED ORDER — SODIUM CHLORIDE 0.9 % IR SOLN
Status: DC | PRN
  Administered 2017-04-11: 3000 mL

## 2017-04-11 MED ORDER — GABAPENTIN 300 MG PO CAPS
300.0000 mg | ORAL_CAPSULE | Freq: Three times a day (TID) | ORAL | Status: DC
  Administered 2017-04-11: 300 mg via ORAL
  Filled 2017-04-11 (×2): qty 1

## 2017-04-11 MED ORDER — MORPHINE SULFATE 15 MG PO TABS
30.0000 mg | ORAL_TABLET | ORAL | Status: DC | PRN
Start: 2017-04-11 — End: 2017-04-12
  Administered 2017-04-11 – 2017-04-12 (×4): 30 mg via ORAL
  Filled 2017-04-11 (×4): qty 2

## 2017-04-11 MED ORDER — MIDAZOLAM HCL 2 MG/2ML IJ SOLN
INTRAMUSCULAR | Status: AC
  Filled 2017-04-11: qty 2

## 2017-04-11 MED ORDER — BUPIVACAINE LIPOSOME 1.3 % IJ SUSP
INTRAMUSCULAR | Status: DC | PRN
  Administered 2017-04-11: 20 mL

## 2017-04-11 MED ORDER — ACETAMINOPHEN 325 MG PO TABS
650.0000 mg | ORAL_TABLET | ORAL | Status: DC | PRN
Start: 2017-04-11 — End: 2017-04-12

## 2017-04-11 MED ORDER — ESMOLOL HCL 100 MG/10ML IV SOLN
INTRAVENOUS | Status: AC
  Filled 2017-04-11: qty 10

## 2017-04-11 MED ORDER — DEXAMETHASONE SODIUM PHOSPHATE 10 MG/ML IJ SOLN
INTRAMUSCULAR | Status: AC
  Filled 2017-04-11: qty 1

## 2017-04-11 MED ORDER — OXYCODONE HCL 5 MG/5ML PO SOLN: 5.0000 mg | Freq: Once | ORAL | Status: DC | PRN

## 2017-04-11 MED ORDER — METOCLOPRAMIDE HCL 5 MG PO TABS: 5.0000 mg | ORAL_TABLET | Freq: Three times a day (TID) | ORAL | Status: DC | PRN

## 2017-04-11 MED ORDER — FENTANYL CITRATE (PF) 100 MCG/2ML IJ SOLN
100.0000 ug | Freq: Once | INTRAMUSCULAR | Status: AC
  Administered 2017-04-11: 100 ug via INTRAVENOUS

## 2017-04-11 MED ORDER — LIDOCAINE 2% (20 MG/ML) 5 ML SYRINGE
INTRAMUSCULAR | Status: AC
  Filled 2017-04-11: qty 5

## 2017-04-11 MED ORDER — BUPIVACAINE HCL 0.5 % IJ SOLN
INTRAMUSCULAR | Status: DC | PRN
  Administered 2017-04-11: 30 mL

## 2017-04-11 MED ORDER — TESTOSTERONE 20.25 MG/1.25GM (1.62%) TD GEL: 2.0000 | Freq: Every day | TRANSDERMAL | Status: DC

## 2017-04-11 MED ORDER — PROMETHAZINE HCL 25 MG/ML IJ SOLN
6.2500 mg | INTRAMUSCULAR | Status: DC | PRN
  Administered 2017-04-11: 6.25 mg via INTRAVENOUS

## 2017-04-11 MED ORDER — ROCURONIUM BROMIDE 10 MG/ML (PF) SYRINGE
PREFILLED_SYRINGE | INTRAVENOUS | Status: AC
  Filled 2017-04-11: qty 5

## 2017-04-11 MED ORDER — PANTOPRAZOLE SODIUM 40 MG PO TBEC
40.0000 mg | DELAYED_RELEASE_TABLET | Freq: Every day | ORAL | Status: DC
  Administered 2017-04-12: 40 mg via ORAL
  Filled 2017-04-11: qty 1

## 2017-04-11 MED ORDER — PROPOFOL 10 MG/ML IV BOLUS
INTRAVENOUS | Status: DC | PRN
  Administered 2017-04-11: 200 mg via INTRAVENOUS

## 2017-04-11 MED ORDER — SODIUM CHLORIDE 0.9 % IV SOLN
INTRAVENOUS | Status: DC
  Administered 2017-04-11: 18:00:00 via INTRAVENOUS

## 2017-04-11 MED ORDER — MORPHINE SULFATE ER 15 MG PO TBCR
30.0000 mg | EXTENDED_RELEASE_TABLET | Freq: Two times a day (BID) | ORAL | Status: DC
  Administered 2017-04-12: 30 mg via ORAL
  Filled 2017-04-11: qty 2

## 2017-04-11 MED ORDER — OXYCODONE HCL ER 10 MG PO T12A: 10.0000 mg | EXTENDED_RELEASE_TABLET | Freq: Two times a day (BID) | ORAL | Status: DC

## 2017-04-11 MED ORDER — CEFAZOLIN SODIUM-DEXTROSE 2-4 GM/100ML-% IV SOLN: 2.0000 g | INTRAVENOUS | Status: DC

## 2017-04-11 MED ORDER — MORPHINE SULFATE (PF) 2 MG/ML IV SOLN: 1.0000 mg | INTRAVENOUS | Status: DC | PRN

## 2017-04-11 MED ORDER — HYDROMORPHONE HCL 1 MG/ML IJ SOLN
0.2500 mg | INTRAMUSCULAR | Status: DC | PRN
  Administered 2017-04-11: 0.5 mg via INTRAVENOUS

## 2017-04-11 MED ORDER — ALBUMIN HUMAN 5 % IV SOLN
INTRAVENOUS | Status: DC | PRN
  Administered 2017-04-11 (×2): via INTRAVENOUS

## 2017-04-11 MED ORDER — POLYETHYLENE GLYCOL 3350 17 G PO PACK: 17.0000 g | PACK | Freq: Every day | ORAL | Status: DC | PRN

## 2017-04-11 MED ORDER — LIDOCAINE 2% (20 MG/ML) 5 ML SYRINGE
INTRAMUSCULAR | Status: DC | PRN
  Administered 2017-04-11: 100 mg via INTRAVENOUS

## 2017-04-11 MED ORDER — FENTANYL CITRATE (PF) 100 MCG/2ML IJ SOLN
INTRAMUSCULAR | Status: DC | PRN
  Administered 2017-04-11 (×9): 50 ug via INTRAVENOUS

## 2017-04-11 MED ORDER — FENTANYL CITRATE (PF) 100 MCG/2ML IJ SOLN
INTRAMUSCULAR | Status: AC
  Administered 2017-04-11: 100 ug via INTRAVENOUS
  Filled 2017-04-11: qty 2

## 2017-04-11 MED ORDER — CHLORHEXIDINE GLUCONATE 4 % EX LIQD: 60.0000 mL | Freq: Once | CUTANEOUS | Status: DC

## 2017-04-11 MED ORDER — LORATADINE 10 MG PO TABS
10.0000 mg | ORAL_TABLET | Freq: Every day | ORAL | Status: DC
  Administered 2017-04-12: 10 mg via ORAL
  Filled 2017-04-11: qty 1

## 2017-04-11 MED ORDER — ONDANSETRON HCL 4 MG/2ML IJ SOLN
INTRAMUSCULAR | Status: DC | PRN
  Administered 2017-04-11: 4 mg via INTRAVENOUS

## 2017-04-11 MED ORDER — MEPERIDINE HCL 25 MG/ML IJ SOLN: 6.2500 mg | INTRAMUSCULAR | Status: DC | PRN

## 2017-04-11 MED ORDER — MENTHOL 3 MG MT LOZG: 1.0000 | LOZENGE | OROMUCOSAL | Status: DC | PRN

## 2017-04-11 SURGICAL SUPPLY — 65 items
ADH SKN CLS APL DERMABOND .7 (GAUZE/BANDAGES/DRESSINGS) ×2
APL SKNCLS STERI-STRIP NONHPOA (GAUZE/BANDAGES/DRESSINGS) ×1
BANDAGE ACE 4X5 VEL STRL LF (GAUZE/BANDAGES/DRESSINGS) ×2 IMPLANT
BANDAGE ELASTIC 6 VELCRO ST LF (GAUZE/BANDAGES/DRESSINGS) ×1 IMPLANT
BANDAGE ESMARK 6X9 LF (GAUZE/BANDAGES/DRESSINGS) ×1 IMPLANT
BENZOIN TINCTURE PRP APPL 2/3 (GAUZE/BANDAGES/DRESSINGS) ×2 IMPLANT
BLADE SAG 18X100X1.27 (BLADE) ×2 IMPLANT
BLADE SAW SGTL 13X75X1.27 (BLADE) ×2 IMPLANT
BNDG CMPR 9X6 STRL LF SNTH (GAUZE/BANDAGES/DRESSINGS) ×1
BNDG CMPR MED 10X6 ELC LF (GAUZE/BANDAGES/DRESSINGS) ×1
BNDG ELASTIC 6X10 VLCR STRL LF (GAUZE/BANDAGES/DRESSINGS) ×2 IMPLANT
BNDG ESMARK 6X9 LF (GAUZE/BANDAGES/DRESSINGS) ×2
BOWL SMART MIX CTS (DISPOSABLE) ×2 IMPLANT
CAP KNEE TOTAL 3 SIGMA ×1 IMPLANT
CEMENT HV SMART SET (Cement) ×4 IMPLANT
COVER SURGICAL LIGHT HANDLE (MISCELLANEOUS) ×2 IMPLANT
CUFF TOURNIQUET SINGLE 34IN LL (TOURNIQUET CUFF) ×2 IMPLANT
DERMABOND ADVANCED (GAUZE/BANDAGES/DRESSINGS) ×2
DERMABOND ADVANCED .7 DNX12 (GAUZE/BANDAGES/DRESSINGS) IMPLANT
DRAPE ORTHO SPLIT 77X108 STRL (DRAPES) ×4
DRAPE SURG ORHT 6 SPLT 77X108 (DRAPES) ×2 IMPLANT
DRAPE U-SHAPE 47X51 STRL (DRAPES) ×2 IMPLANT
DRSG ADAPTIC 3X8 NADH LF (GAUZE/BANDAGES/DRESSINGS) ×2 IMPLANT
DRSG PAD ABDOMINAL 8X10 ST (GAUZE/BANDAGES/DRESSINGS) ×3 IMPLANT
DURAPREP 26ML APPLICATOR (WOUND CARE) ×3 IMPLANT
ELECT REM PT RETURN 9FT ADLT (ELECTROSURGICAL) ×2
ELECTRODE REM PT RTRN 9FT ADLT (ELECTROSURGICAL) ×1 IMPLANT
EVACUATOR 1/8 PVC DRAIN (DRAIN) IMPLANT
FACESHIELD WRAPAROUND (MASK) ×4 IMPLANT
FACESHIELD WRAPAROUND OR TEAM (MASK) ×2 IMPLANT
GAUZE SPONGE 4X4 12PLY STRL (GAUZE/BANDAGES/DRESSINGS) ×2 IMPLANT
GLOVE ECLIPSE 9.0 STRL (GLOVE) ×4 IMPLANT
GLOVE SURG 8.5 LATEX PF (GLOVE) ×2 IMPLANT
GOWN STRL REUS W/ TWL LRG LVL3 (GOWN DISPOSABLE) ×1 IMPLANT
GOWN STRL REUS W/TWL 2XL LVL3 (GOWN DISPOSABLE) ×4 IMPLANT
GOWN STRL REUS W/TWL LRG LVL3 (GOWN DISPOSABLE) ×2
HANDPIECE INTERPULSE COAX TIP (DISPOSABLE) ×2
IMMOBILIZER KNEE 22 UNIV (SOFTGOODS) IMPLANT
KIT BASIN OR (CUSTOM PROCEDURE TRAY) ×2 IMPLANT
KIT ROOM TURNOVER OR (KITS) ×2 IMPLANT
MANIFOLD NEPTUNE II (INSTRUMENTS) ×2 IMPLANT
NDL HYPO 25X1 1.5 SAFETY (NEEDLE) IMPLANT
NEEDLE HYPO 25X1 1.5 SAFETY (NEEDLE) ×2 IMPLANT
NS IRRIG 1000ML POUR BTL (IV SOLUTION) ×2 IMPLANT
PACK TOTAL JOINT (CUSTOM PROCEDURE TRAY) ×2 IMPLANT
PAD ARMBOARD 7.5X6 YLW CONV (MISCELLANEOUS) ×4 IMPLANT
PAD CAST 4YDX4 CTTN HI CHSV (CAST SUPPLIES) ×1 IMPLANT
PADDING CAST COTTON 4X4 STRL (CAST SUPPLIES) ×2
PADDING CAST COTTON 6X4 STRL (CAST SUPPLIES) ×3 IMPLANT
SET HNDPC FAN SPRY TIP SCT (DISPOSABLE) ×1 IMPLANT
SUCTION FRAZIER HANDLE 10FR (MISCELLANEOUS) ×1
SUCTION TUBE FRAZIER 10FR DISP (MISCELLANEOUS) IMPLANT
SUT BONE WAX W31G (SUTURE) ×1 IMPLANT
SUT VIC AB 0 CT1 27 (SUTURE) ×4
SUT VIC AB 0 CT1 27XBRD ANBCTR (SUTURE) ×2 IMPLANT
SUT VIC AB 1 CT1 27 (SUTURE) ×4
SUT VIC AB 1 CT1 27XBRD ANBCTR (SUTURE) ×2 IMPLANT
SUT VIC AB 2-0 CT1 27 (SUTURE) ×6
SUT VIC AB 2-0 CT1 TAPERPNT 27 (SUTURE) ×3 IMPLANT
SUT VICRYL 4-0 PS2 18IN ABS (SUTURE) ×2 IMPLANT
SYR CONTROL 10ML LL (SYRINGE) ×1 IMPLANT
TOWEL OR 17X24 6PK STRL BLUE (TOWEL DISPOSABLE) ×2 IMPLANT
TOWEL OR 17X26 10 PK STRL BLUE (TOWEL DISPOSABLE) ×2 IMPLANT
TRAY CATH 16FR W/PLASTIC CATH (SET/KITS/TRAYS/PACK) ×1 IMPLANT
WATER STERILE IRR 1000ML POUR (IV SOLUTION) ×2 IMPLANT

## 2017-04-11 NOTE — Anesthesia Preprocedure Evaluation (Signed)
Anesthesia Evaluation  Patient identified by MRN, date of birth, ID band Patient awake    Reviewed: Allergy & Precautions, NPO status , Patient's Chart, lab work & pertinent test results  Airway Mallampati: II  TM Distance: >3 FB Neck ROM: Limited    Dental no notable dental hx. (+) Teeth Intact   Pulmonary asthma ,    Pulmonary exam normal breath sounds clear to auscultation- rhonchi       Cardiovascular hypertension, Pt. on medications Normal cardiovascular exam Rhythm:Regular Rate:Normal     Neuro/Psych Anxiety Left cervical radiculopathy  Neuromuscular disease    GI/Hepatic Neg liver ROS, GERD  Medicated and Controlled,Dysphagia   Endo/Other  Hypotestosteronemia Obesity  Renal/GU negative Renal ROS  negative genitourinary   Musculoskeletal  (+) Arthritis , Osteoarthritis,  OA left knee DDD cervical spine Chronic back pain   Abdominal (+) + obese,   Peds  Hematology negative hematology ROS (+) Hx/o erythrocytosis   Anesthesia Other Findings   Reproductive/Obstetrics                             Anesthesia Physical  Anesthesia Plan  ASA: II  Anesthesia Plan: General and Regional   Post-op Pain Management:  Regional for Post-op pain   Induction: Intravenous  PONV Risk Score and Plan: 2  Airway Management Planned: LMA  Additional Equipment:   Intra-op Plan:   Post-operative Plan: Extubation in OR  Informed Consent: I have reviewed the patients History and Physical, chart, labs and discussed the procedure including the risks, benefits and alternatives for the proposed anesthesia with the patient or authorized representative who has indicated his/her understanding and acceptance.   Dental advisory given  Plan Discussed with: Anesthesiologist, CRNA and Surgeon  Anesthesia Plan Comments:         Anesthesia Quick Evaluation

## 2017-04-11 NOTE — Progress Notes (Signed)
Jmaes has done well, awakened abruptly in pacu, medicated by crna. Has tolerated cpm wll and takes po flds. Has tendency to obstruct, but clears when name is called. Denies need for CPAP or being told need same after sleep study. Responds readily/appropriately to name call.

## 2017-04-11 NOTE — Discharge Instructions (Signed)
° ° °Keep knee incision dry for 5 days post op then may wet while bathing. °Therapy daily and CPM goal full extension and greater than 90 degrees flexion. °Call if fever or chills or increased drainage. °Go to ER if acutely short of breath or call for ambulance. °Return for follow up in 2 weeks. °May full weight bear on the surgical leg unless told otherwise. °Use knee immobilizer until able to straight leg raise off bed with knee stable. °In house walking for first 2 weeks. °INSTRUCTIONS AFTER JOINT REPLACEMENT  ° °o Remove items at home which could result in a fall. This includes throw rugs or furniture in walking pathways °o ICE to the affected joint every three hours while awake for 30 minutes at a time, for at least the first 3-5 days, and then as needed for pain and swelling.  Continue to use ice for pain and swelling. You may notice swelling that will progress down to the foot and ankle.  This is normal after surgery.  Elevate your leg when you are not up walking on it.   °o Continue to use the breathing machine you got in the hospital (incentive spirometer) which will help keep your temperature down.  It is common for your temperature to cycle up and down following surgery, especially at night when you are not up moving around and exerting yourself.  The breathing machine keeps your lungs expanded and your temperature down. ° ° °DIET:  As you were doing prior to hospitalization, we recommend a well-balanced diet. ° °DRESSING / WOUND CARE / SHOWERING ° °You may change your dressing 3-5 days after surgery.  Then change the dressing every day with sterile gauze.  Please use good hand washing techniques before changing the dressing.  Do not use any lotions or creams on the incision until instructed by your surgeon. ° °ACTIVITY ° °o Increase activity slowly as tolerated, but follow the weight bearing instructions below.   °o No driving for 6 weeks or until further direction given by your physician.  You cannot  drive while taking narcotics.  °o No lifting or carrying greater than 10 lbs. until further directed by your surgeon. °o Avoid periods of inactivity such as sitting longer than an hour when not asleep. This helps prevent blood clots.  °o You may return to work once you are authorized by your doctor.  ° ° ° °WEIGHT BEARING  ° °Weight bearing as tolerated with assist device (walker, cane, etc) as directed, use it as long as suggested by your surgeon or therapist, typically at least 4-6 weeks. ° ° °EXERCISES ° °Results after joint replacement surgery are often greatly improved when you follow the exercise, range of motion and muscle strengthening exercises prescribed by your doctor. Safety measures are also important to protect the joint from further injury. Any time any of these exercises cause you to have increased pain or swelling, decrease what you are doing until you are comfortable again and then slowly increase them. If you have problems or questions, call your caregiver or physical therapist for advice.  ° °Rehabilitation is important following a joint replacement. After just a few days of immobilization, the muscles of the leg can become weakened and shrink (atrophy).  These exercises are designed to build up the tone and strength of the thigh and leg muscles and to improve motion. Often times heat used for twenty to thirty minutes before working out will loosen up your tissues and help with improving the range   of motion but do not use heat for the first two weeks following surgery (sometimes heat can increase post-operative swelling).  ° °These exercises can be done on a training (exercise) mat, on the floor, on a table or on a bed. Use whatever works the best and is most comfortable for you.    Use music or television while you are exercising so that the exercises are a pleasant break in your day. This will make your life better with the exercises acting as a break in your routine that you can look forward  to.   Perform all exercises about fifteen times, three times per day or as directed.  You should exercise both the operative leg and the other leg as well. ° °Exercises include: °  °• Quad Sets - Tighten up the muscle on the front of the thigh (Quad) and hold for 5-10 seconds.   °• Straight Leg Raises - With your knee straight (if you were given a brace, keep it on), lift the leg to 60 degrees, hold for 3 seconds, and slowly lower the leg.  Perform this exercise against resistance later as your leg gets stronger.  °• Leg Slides: Lying on your back, slowly slide your foot toward your buttocks, bending your knee up off the floor (only go as far as is comfortable). Then slowly slide your foot back down until your leg is flat on the floor again.  °• Angel Wings: Lying on your back spread your legs to the side as far apart as you can without causing discomfort.  °• Hamstring Strength:  Lying on your back, push your heel against the floor with your leg straight by tightening up the muscles of your buttocks.  Repeat, but this time bend your knee to a comfortable angle, and push your heel against the floor.  You may put a pillow under the heel to make it more comfortable if necessary.  ° °A rehabilitation program following joint replacement surgery can speed recovery and prevent re-injury in the future due to weakened muscles. Contact your doctor or a physical therapist for more information on knee rehabilitation.  ° ° °CONSTIPATION ° °Constipation is defined medically as fewer than three stools per week and severe constipation as less than one stool per week.  Even if you have a regular bowel pattern at home, your normal regimen is likely to be disrupted due to multiple reasons following surgery.  Combination of anesthesia, postoperative narcotics, change in appetite and fluid intake all can affect your bowels.  ° °YOU MUST use at least one of the following options; they are listed in order of increasing strength to get  the job done.  They are all available over the counter, and you may need to use some, POSSIBLY even all of these options:   ° °Drink plenty of fluids (prune juice may be helpful) and high fiber foods °Colace 100 mg by mouth twice a day  °Senokot for constipation as directed and as needed Dulcolax (bisacodyl), take with full glass of water  °Miralax (polyethylene glycol) once or twice a day as needed. ° °If you have tried all these things and are unable to have a bowel movement in the first 3-4 days after surgery call either your surgeon or your primary doctor.   ° °If you experience loose stools or diarrhea, hold the medications until you stool forms back up.  If your symptoms do not get better within 1 week or if they get worse, check with your   doctor.  If you experience "the worst abdominal pain ever" or develop nausea or vomiting, please contact the office immediately for further recommendations for treatment. ° ° °ITCHING:  If you experience itching with your medications, try taking only a single pain pill, or even half a pain pill at a time.  You can also use Benadryl over the counter for itching or also to help with sleep.  ° °TED HOSE STOCKINGS:  Use stockings on both legs until for at least 2 weeks or as directed by physician office. They may be removed at night for sleeping. ° °MEDICATIONS:  See your medication summary on the “After Visit Summary” that nursing will review with you.  You may have some home medications which will be placed on hold until you complete the course of blood thinner medication.  It is important for you to complete the blood thinner medication as prescribed. ° °PRECAUTIONS:  If you experience chest pain or shortness of breath - call 911 immediately for transfer to the hospital emergency department.  ° °If you develop a fever greater that 101 F, purulent drainage from wound, increased redness or drainage from wound, foul odor from the wound/dressing, or calf pain - CONTACT YOUR  SURGEON.   °                                                °FOLLOW-UP APPOINTMENTS:  If you do not already have a post-op appointment, please call the office for an appointment to be seen by your surgeon.  Guidelines for how soon to be seen are listed in your “After Visit Summary”, but are typically between 1-4 weeks after surgery. ° °OTHER INSTRUCTIONS:  ° °Knee Replacement:  Do not place pillow under knee, focus on keeping the knee straight while resting. CPM instructions: 0-90 degrees, 2 hours in the morning, 2 hours in the afternoon, and 2 hours in the evening. Place foam block, curve side up under heel at all times except when in CPM or when walking.  DO NOT modify, tear, cut, or change the foam block in any way. ° °MAKE SURE YOU:  °• Understand these instructions.  °• Get help right away if you are not doing well or get worse.  ° ° °Thank you for letting us be a part of your medical care team.  It is a privilege we respect greatly.  We hope these instructions will help you stay on track for a fast and full recovery!  ° °

## 2017-04-11 NOTE — Op Note (Signed)
04/11/2017  4:20 PM  PATIENT:  Jesus Brown  52 y.o. male  MRN: [id number]  OPERATIVE REPORT   PRE-OPERATIVE DIAGNOSIS:  Right knee osteoarthritis  POST-OPERATIVE DIAGNOSIS:  Right knee osteoarthritis  PROCEDURE:  Procedure(s): RIGHT TOTAL KNEE ARTHROPLASTY    SURGEON: Basil Dess, MD     ASSISTANT:  Laure Kidney, CRNFA,  (Present throughout the entire procedure and necessary for completion of procedure in a timely manner)     ANESTHESIA:  General with supplemental right femoral block.     COMPLICATIONS:  None.   EBL:250cc  TOTAL TOURNIQUET TIME:          MIN @      Mm Hg    COMPONENTS:  De[employer] Sigma cemented total knee system.  A #3 femoral component.  A #4 tibial tray with a 10 mm polyethylene RP tibial spacer, a 38 mm polyethylene patella.   PROCEDURE:The patient was met in the holding area, and the appropriate right knee identified and marked with "X" and my initials. The patient did not received a preoperative femoral nerve block by anesthesia.  The patient was then transported to OR and was placed on the operative table in a supine position. The patient was then placed under general anesthesia without difficulty. The patient received appropriate preoperative antibiotic prophylaxis vancomycin and ancef. The patien'ts right knee was examined under anesthesia and shown to have 0-130 range of motion,varus deformity, ligamentously stable, and normal patella tracking.Tourniquet was applied to the operative thigh. Leg was then prepped using sterile conditions and draped using sterile technique. Time-out procedure was called and correct right knee identified.  The right leg was elevated and Esmarch exsanguinated with a thigh  tourniquet elevated ot 275 mmHg.  Initially, through a 15-cm longitudinal incision based over the patella initial exposure was made. The underlying subcutaneous tissues were incised along with the skin incision. A median arthrotomy was performed revealing an  excessive amount of normal-appearing joint fluid, The articular surfaces were inspected. The patient had grade 4 changes medially, grade 2 changes laterally, and grade 4 changes in the patellofemoral joint. Osteophytes were removed from the femoral condyles and the tibial plateau. The medial and lateral meniscal remnants were removed as well as the anterior cruciate ligament.   Attention then turned to the femur where the knee was flexed to 90 degrees and an intramedullary guide placed a drill hole placed just anterior 7 mm to the intercondylar notch on the distal femur. The distal cutting jig then attached to the intramedulllay nail 5 degrees of valgus for the right knee. A planned 9 mm to be removed off the most prominent condyle, medial in this case. The jig pinned in place and the distal femoral cut performed.  Anterior bow of the femur measured at 0. Based on this then the amount of proximal tibial cut was determined to be in line with off the depressed medial side of the joint 40m. Using the proximal tibial cutting jig with the leg alignment guide the jig was pinned to the proximal tibia. 3 pins were used 0 degrees of posterior slope.Tibia then subluxed and proximal tibial cut was then performed. Soft tissue tensioning then examined and found to be well balanced in extension. #3 femoral component chosen. The trial placed against the end of the femur and felt to be a good fit. Distal femur cutting jig was then carefully positioned on the distal femur using the end guide and placing 2 pins in the anterior end of the cut  distal femur.  2 threaded pins used to pin the jig in place   Rotation guide was then corrected the alignment 2 pins placed over the distal cut surface of the femur for placement of the jig for the chamfer cuts and coronal cuts. This is a carefully placed and held in place with threaded pins. Protecting soft tissues then the anterior cut was made after first verifying with the wing depth of the  cut. Then the posterior coronal cuts protect soft tissue structures posteriorly. Posterior chamfer cut and anterior chamfer cut. These were done without difficulty. Cutting guide removed and box cutting jig was then applied to the distal femur carefully aligned and pinned into place. Box cut was then performed from the distal femur intercondylar box. Proximal right tibia was then subluxed the posterior cruciate resected. A #4 plate and attached to the transverse cut surface of the proximal right tibia using 2 pins. The upright for the reamer was then applied and reaming carried out for the keel for the tibial component. Using the osteotome was then inserted and impacted into place the temporary fixation pins removed from the proximal tibial plate. Femoral component was inserted using a trial component. Then a 10 mm insert placed and the knee brought into full extension full extension to 0 and 1.5 hyperextension was possible full flexion to 135 without difficulty or lift off. The components were accepted and the permanent #3 femoral and tibial components chosen and brought onto the field. Patella was observed to be a width anterior to posterior 25 mm and the residual 16 mm was allowed for the patella component. The cutting jig was then adjusted to allow for 16 mm remaining width. Applied and then the coronal cut the posterior aspect of the patella was performed. Lateral facet of patella showed very little resection. The trial 38 mm patella was chosen based on the size of the cut surface of patella. The 38 mm drill plate applied and drill holes placed through the posterior aspect of the patella. Trial reduction with the 38 mm trial and the leg placed through range of motion showed excellent motion full extension and flexion 135 patella tendency to elevate medially was clamped in place over the medial retinaculum patellar showed normal appearance with flexion extension. No shuck noted then the knee was stable to  varus and valgus at 0 30 and 60. Expose were then removed and the knee was then irrigated with copious amounts of. Solution. A 38 mm permanent patella prosthesis also brought to the field. Cement was mixed the knee was subluxed and carefully soft tissues protected note that the lug holes in the distal femur where drill using the trial prosthesis and with the drill guide provided. With irrigation completed and permanent tibial components were then inserted into place using cement and a semi-putty state over the proximal aspect of the tibia cement was also applied to the deep surface of the tibial component as well as over the posterior runners of the femur and the deep surface of the patella component. These were then carefully place excess cement removed about the circumference of the tibial tray the femur was then placed applying cement to the cut it distal aspect of the femur and posterior chamfer area anterior chamfer and anterior coronal cut surface small amount within the box. Femoral component was then impacted into place excess cement removed amounts of circumference including the posterior chamfer area and the posterior femoral condyle area. The trial tibial peg then placed in the  tibial component and a 10 mm rotating platform trial was inserted knee brought into full extension observed on the appeared to be in 2 of valgus with full extension 1 of hyperextension. Cement was then applied to the posterior cut surface of the patella within each of the patella peg opening was then pin holes were placed for the cementing along the lateral aspect of the patella. Patella component was then carefully aligned and inserted over the posterior aspect of the patella and clamped in place excess cement was then resected circumferentially using scalpel in order for her to preserve cement the lateral facet area. Cement was allowed to fully harden tourniquet was released while cement was nearly completely hardened.  Following this then irrigation was carried down careful inspection demonstrated some small bleeders present over the lateral posterior aspect of the knee joint cauterized using electrocautery off to the medial aspect and the areas of geniculate arteries. There is no active bleeding present then at that time. Trial 10 mm tibial insert demonstrated stability of the anterior drawer and negative and the knee stable to varus valgus stress and 30 and 60 flexion and full extension. A 10 mm rotating platform insert was then chosen this was applied to the tibia using the permanent implant removing the trial examining tibia and determined there was no evident residual cement remaining. Also examined posterior runners I posterior aspect of the femoral condyles for any residual cement none was present. The tibial insert in place the reduced placed through range of motion and full extension flexion and 30 knee stable to varus stress at 0 30 and 60 anterior drawer negative. This component was accepted.Tourniquet released at 81 minutes.  A drain was not necessary the synovium reapproximated over the medial aspect of the knee with 0 Vicryl sutures.  The retinaculum of the knee and the quadriceps tendon were then reapproximated with interrupted #1 Vicryl sutures.The patient demonstrated bleeding from the soft tissues without arteriole bleeding, the right upper thigh tourniquet was loosened and completely released from the leg. TXA was ordered and given 1 gram via IV.  Peritenon of the reapproximated with interrupted 0 Vicryl sutures deep subcutaneous layers approximated with interrupted 0 and 2-0 Vicryl sutures and the skin closed with stainless steel staples. Adaptic 4 x 4's ABDs pads fixed to the skin with sterile labrum an Ace wrap applied from the foot to the right upper thigh and knee immobilizer. All instrument and sponge counts were correct. Then reactivated extubated and returned to recovery room in satisfactory  condition.  Laure Kidney, CRNFA perform the duties of assistant surgeon she performed careful retraction of soft tissues assisted in addition the patient had removal on the OR table is present beginning the case the case and performed closure of the incision from the peritenon to the skin and application of dressing.  Justinn Welter 04/11/2017, 4:20 PM

## 2017-04-11 NOTE — Anesthesia Procedure Notes (Signed)
Anesthesia Regional Block: Adductor canal block   Pre-Anesthetic Checklist: ,, timeout performed, Correct Patient, Correct Site, Correct Laterality, Correct Procedure, Correct Position, site marked, Risks and benefits discussed,  Surgical consent,  Pre-op evaluation,  At surgeon's request and post-op pain management  Laterality: Right  Prep: chloraprep       Needles:  Injection technique: Single-shot  Needle Type: Stimiplex     Needle Length: 9cm  Needle Gauge: 21     Additional Needles:   Procedures:,,,, ultrasound used (permanent image in chart),,,,  Narrative:  Start time: 04/11/2017 12:29 PM End time: 04/11/2017 12:34 PM Injection made incrementally with aspirations every 5 mL.  Performed by: Personally  Anesthesiologist: Lynda Rainwater, MD

## 2017-04-11 NOTE — Anesthesia Procedure Notes (Signed)
Procedure Name: LMA Insertion Date/Time: 04/11/2017 1:23 PM Performed by: White, Amedeo Plenty, CRNA Pre-anesthesia Checklist: Patient identified, Emergency Drugs available, Suction available and Patient being monitored Patient Re-evaluated:Patient Re-evaluated prior to induction Oxygen Delivery Method: Circle System Utilized Preoxygenation: Pre-oxygenation with 100% oxygen Induction Type: IV induction Ventilation: Mask ventilation without difficulty LMA: LMA inserted LMA Size: 5.0 Number of attempts: 1 Placement Confirmation: positive ETCO2 Tube secured with: Tape Dental Injury: Teeth and Oropharynx as per pre-operative assessment

## 2017-04-11 NOTE — Progress Notes (Signed)
Orthopedic Tech Progress Note Patient Details:  Jesus Brown [birth year] [id number]  CPM Right Knee CPM Right Knee: On Right Knee Flexion (Degrees): 60 Right Knee Extension (Degrees): 0 Additional Comments: applied cpm at 0-60 on right knee. pt tolerated application very well.   Right knee  Post Interventions Patient Tolerated: Well Instructions Provided: Care of device  Kristopher Oppenheim 04/11/2017, 5:18 PM

## 2017-04-11 NOTE — Progress Notes (Signed)
Orthopedic Tech Progress Note Patient Details:  Jesus Brown 09-15-1965 [id number] Overhead frame applied  Patient ID: Jesus Brown, male   DOB: December 24, 1965, 52 y.o.   MRN: [id number]   Jesus Brown 04/11/2017, 5:33 PM

## 2017-04-11 NOTE — Anesthesia Postprocedure Evaluation (Addendum)
Anesthesia Post Note  Patient: Jesus Brown  Procedure(s) Performed: RIGHT TOTAL KNEE ARTHROPLASTY (Right )     Patient location during evaluation: PACU Anesthesia Type: Regional and General Level of consciousness: awake and alert, patient cooperative and oriented Pain management: pain level controlled Vital Signs Assessment: post-procedure vital signs reviewed and stable Respiratory status: spontaneous breathing, nonlabored ventilation, respiratory function stable and patient connected to nasal cannula oxygen Cardiovascular status: blood pressure returned to baseline and stable Postop Assessment: no apparent nausea or vomiting Anesthetic complications: no    Last Vitals:  Vitals:   04/11/17 1629 04/11/17 1644  BP: 130/90 135/90  Pulse:  (!) 108  Resp: 12 13  Temp: 37.2 C   SpO2: 96% 94%    Last Pain:  Vitals:   04/11/17 1128  TempSrc: Oral                 Matti Minney,E. Augie Vane

## 2017-04-11 NOTE — Transfer of Care (Signed)
Immediate Anesthesia Transfer of Care Note  Patient: Jesus Brown  Procedure(s) Performed: RIGHT TOTAL KNEE ARTHROPLASTY (Right )  Patient Location: PACU  Anesthesia Type:GA combined with regional for post-op pain  Level of Consciousness: drowsy and responds to stimulation  Airway & Oxygen Therapy: Patient Spontanous Breathing and Patient connected to nasal cannula oxygen  Post-op Assessment: Report given to RN and Post -op Vital signs reviewed and stable  Post vital signs: Reviewed and stable  Last Vitals:  Vitals:   04/11/17 1128 04/11/17 1629  BP: 117/76   Pulse: 80   Resp: 19   Temp: 36.9 C (P) 37.2 C  SpO2: 97% (P) 96%    Last Pain:  Vitals:   04/11/17 1128  TempSrc: Oral      Patients Stated Pain Goal: 2 (91/79/15 0569)  Complications: No apparent anesthesia complications

## 2017-04-11 NOTE — Interval H&P Note (Signed)
History and Physical Interval Note:  04/11/2017 1:08 PM  Jesus Brown  has presented today for surgery, with the diagnosis of Right knee osteoarthritis  The various methods of treatment have been discussed with the patient and family. After consideration of risks, benefits and other options for treatment, the patient has consented to  Procedure(s): RIGHT TOTAL KNEE ARTHROPLASTY (Right) as a surgical intervention .  The patient's history has been reviewed, patient examined, no change in status, stable for surgery.  I have reviewed the patient's chart and labs.  Questions were answered to the patient's satisfaction.     Basil Dess

## 2017-04-11 NOTE — Brief Op Note (Signed)
04/11/2017  4:15 PM  PATIENT:  Jesus Brown  52 y.o. male  PRE-OPERATIVE DIAGNOSIS:  Right knee osteoarthritis  POST-OPERATIVE DIAGNOSIS:  Right knee osteoarthritis  PROCEDURE:  Procedure(s): RIGHT TOTAL KNEE ARTHROPLASTY (Right)  SURGEON:  Surgeon(s) and Role:    * Jessy Oto, MD - Primary  PHYSICIAN ASSISTANT: Laure Kidney, CRNA  ANESTHESIA:   regional and general  EBL:  500 mL   BLOOD ADMINISTERED:none  DRAINS: Urinary Catheter (Foley) and Removal of foley at the end of the case.   LOCAL MEDICATIONS USED:  OTHER right femoral nerve block.  SPECIMEN:  No Specimen  DISPOSITION OF SPECIMEN:  N/A  COUNTS:  YES  TOURNIQUET:   Total Tourniquet Time Documented: Thigh (Right) - 81 minutes Total: Thigh (Right) - 81 minutes   DICTATION: .Viviann Spare Dictation  PLAN OF CARE: Admit to inpatient   PATIENT DISPOSITION:  PACU - hemodynamically stable.   Delay start of Pharmacological VTE agent (>24hrs) due to surgical blood loss or risk of bleeding: no

## 2017-04-12 DIAGNOSIS — M1711 Unilateral primary osteoarthritis, right knee: Secondary | ICD-10-CM | POA: Diagnosis not present

## 2017-04-12 LAB — CBC
HCT: 40.2 % (ref 39.0–52.0)
HEMOGLOBIN: 12.8 g/dL — AB (ref 13.0–17.0)
MCH: 25.7 pg — ABNORMAL LOW (ref 26.0–34.0)
MCHC: 31.8 g/dL (ref 30.0–36.0)
MCV: 80.6 fL (ref 78.0–100.0)
Platelets: 244 10*3/uL (ref 150–400)
RBC: 4.99 MIL/uL (ref 4.22–5.81)
RDW: 15.9 % — ABNORMAL HIGH (ref 11.5–15.5)
WBC: 16.8 10*3/uL — AB (ref 4.0–10.5)

## 2017-04-12 LAB — BASIC METABOLIC PANEL
ANION GAP: 12 (ref 5–15)
BUN: 14 mg/dL (ref 6–20)
CALCIUM: 8.7 mg/dL — AB (ref 8.9–10.3)
CO2: 27 mmol/L (ref 22–32)
CREATININE: 0.97 mg/dL (ref 0.61–1.24)
Chloride: 96 mmol/L — ABNORMAL LOW (ref 101–111)
GFR calc non Af Amer: >60 mL/min (ref >60)
Glucose, Bld: 154 mg/dL — ABNORMAL HIGH (ref 65–99)
Potassium: 3.2 mmol/L — ABNORMAL LOW (ref 3.5–5.1)
SODIUM: 135 mmol/L (ref 135–145)

## 2017-04-12 MED ORDER — CHLORHEXIDINE GLUCONATE CLOTH 2 % EX PADS
6.0000 | MEDICATED_PAD | Freq: Every day | CUTANEOUS | Status: DC
  Administered 2017-04-12: 6 via TOPICAL

## 2017-04-12 MED ORDER — MUPIROCIN 2 % EX OINT
1.0000 "application " | TOPICAL_OINTMENT | Freq: Two times a day (BID) | CUTANEOUS | Status: DC
  Administered 2017-04-12: 1 via NASAL
  Filled 2017-04-12: qty 22

## 2017-04-12 NOTE — Progress Notes (Signed)
Provided discharge education/instructions, all questions and concerns addressed, Pt not in distress, discharged home with belongings accompanied by wife and children.

## 2017-04-12 NOTE — Care Management Note (Addendum)
Case Management Note  Patient Details  Name: Jesus Brown MRN: [id number] Date of Birth: 06-Mar-1966  Subjective/Objective:   RIGHT TOTAL KNEE ARTHROPLASTY               Action/Plan: Patient discharging home today with [place]; [place] choice offered, pt chose [facility]; [place] with [facility] called for arrangements.  Expected Discharge Date:  04/12/17               Expected Discharge Plan:  [place]  Discharge planning Services  CM Consult   Choice offered to:  Patient  HH Arranged:  PT HH Agency:  [facility] Status of Service:  In process, will continue to follow  Sherrilyn Rist [phone] 04/12/2017, 11:14 AM

## 2017-04-12 NOTE — Evaluation (Signed)
Physical Therapy Evaluation and Discharge Patient Details Name: Jesus Brown MRN: [id number] DOB: 04/25/1965 Today's Date: 04/12/2017   History of Present Illness  52 y.o. male s/p R TKA. PMH includes: HTN, GERD, DDD, Asthma, Arthritis, Anxiety, L TKA, Shoulder Surgery, Cervical fusion, ankle surgery, wrist surgery.     Clinical Impression  Patient is s/p above surgery resulting in functional limitations due to the deficits listed below (see PT Problem List). PTA, patient was independent with all mobility living at home with wife with family 24/7 support. Upon eval, patient presents with mild post op pain and weakness that limits his mobility. Currently Mod I for bed mobility and Supervision level for long distance ambulation. Patient reports no questions or concerns for PT at this time, feels ready to return home once medically ready for d/c. Reviewed therex and recommendations for home and discussed course of rehab with patient moving forward. Patient will benefit from skilled PT to increase their independence and safety with mobility to allow discharge to the venue listed below.       Follow Up Recommendations Home health PT;Supervision for mobility/OOB;DC plan and follow up therapy as arranged by surgeon    Equipment Recommendations  None recommended by PT    Recommendations for Other Services       Precautions / Restrictions Precautions Precautions: None Precaution Comments: Reviewed supine therex and no pillow under knee Restrictions Weight Bearing Restrictions: Yes RLE Weight Bearing: Weight bearing as tolerated      Mobility  Bed Mobility Overal bed mobility: Modified Independent                Transfers Overall transfer level: Modified independent                  Ambulation/Gait Ambulation/Gait assistance: Supervision Ambulation Distance (Feet): 400 Feet Assistive device: Rolling walker (2 wheeled) Gait Pattern/deviations: Step-to pattern;Step-through  pattern;Antalgic;Decreased step length - left Gait velocity: slightly decreased   General Gait Details: cues for heel strke and step length. patient progressed to step through gait. supervision for safety no rest breaks needed.   Stairs            Wheelchair Mobility    Modified Rankin (Stroke Patients Only)       Balance Overall balance assessment: No apparent balance deficits (not formally assessed)                                           Pertinent Vitals/Pain Pain Assessment: 0-10 Pain Score: 5  Pain Location: R knee Pain Descriptors / Indicators: Aching;Discomfort;Grimacing Pain Intervention(s): Limited activity within patient's tolerance;Monitored during session;Premedicated before session    Home Living Family/patient expects to be discharged to:: Private residence Living Arrangements: Spouse/significant other Available Help at Discharge: Family;Available 24 hours/day Type of Home: House Home Access: Stairs to enter Entrance Stairs-Rails: None Entrance Stairs-Number of Steps: 1 Home Layout: Two level;Able to live on main level with bedroom/bathroom Home Equipment: Cane - single point;Hand held shower head;Walker - 2 wheels      Prior Function Level of Independence: Independent         Comments: ambulating with cane sometimes. completely independent and driving. 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 LLE 4/5)    Cervical / Trunk Assessment Cervical / Trunk Assessment: Normal  Communication   Communication: No difficulties  Cognition Arousal/Alertness: Awake/alert Behavior During Therapy: WFL for tasks assessed/performed Overall Cognitive Status:  Within Functional Limits for tasks assessed                                        General Comments      Exercises Total Joint Exercises Ankle Circles/Pumps: AROM;Both;20 reps Quad Sets: AROM;Both;10 reps Goniometric ROM: 90 flexion   Assessment/Plan    PT Assessment All further PT needs can be met in the next venue of care  PT Problem List Decreased strength;Decreased range of motion;Decreased activity tolerance;Decreased balance;Pain       PT Treatment Interventions      PT Goals (Current goals can be found in the Care Plan section)  Acute Rehab PT Goals Patient Stated Goal: go home today PT Goal Formulation: With patient Time For Goal Achievement: 04/19/17 Potential to Achieve Goals: Good    Frequency     Barriers to discharge        Co-evaluation               AM-PAC PT "6 Clicks" Daily Activity  Outcome Measure Difficulty turning over in bed (including adjusting bedclothes, sheets and blankets)?: A Little Difficulty moving from lying on back to sitting on the side of the bed? : A Little Difficulty sitting down on and standing up from a chair with arms (e.g., wheelchair, bedside commode, etc,.)?: A Little Help needed moving to and from a bed to chair (including a wheelchair)?: A Little Help needed walking in hospital room?: A Little Help needed climbing 3-5 steps with a railing? : A Little 6 Click Score: 18    End of Session Equipment Utilized During Treatment: Gait belt Activity Tolerance: Patient tolerated treatment well Patient left: in bed;with call bell/phone within reach;with family/visitor present Nurse Communication: Mobility status PT Visit Diagnosis: Unsteadiness on feet (R26.81);Other abnormalities of gait and mobility (R26.89);Muscle weakness (generalized) (M62.81);Pain Pain - Right/Left: Right Pain - part of body: Knee    Time: 0902-0930 PT Time Calculation (min) (ACUTE ONLY): 28 min   Charges:   PT Evaluation $PT  Eval Low Complexity: 1 Low PT Treatments $Gait Training: 8-22 mins   PT G Codes:       Reinaldo Berber, PT, DPT Acute Rehab Services Pager: [phone]   Reinaldo Berber 04/12/2017, 9:41 AM

## 2017-04-12 NOTE — Plan of Care (Signed)
  Nutrition: Adequate nutrition will be maintained 04/12/2017 1036 - Progressing by Williams Che, RN   Elimination: Will not experience complications related to bowel motility 04/12/2017 1036 - Progressing by Williams Che, RN   Pain Managment: General experience of comfort will improve 04/12/2017 1036 - Progressing by Williams Che, RN   Safety: Ability to remain free from injury will improve 04/12/2017 1036 - Progressing by Williams Che, RN

## 2017-04-12 NOTE — Discharge Summary (Signed)
Discharge Diagnoses:  Active Problems:   Unilateral primary osteoarthritis, right knee   S/P total knee replacement using cement, right   Surgeries: Procedure(s): RIGHT TOTAL KNEE ARTHROPLASTY on 04/11/2017    Consultants:   Discharged Condition: Improved  Hospital Course: Jesus Brown is an 52 y.o. male who was admitted 04/11/2017 with a chief complaint of right knee osteoarthritis, with a final diagnosis of Right knee osteoarthritis.  Patient was brought to the operating room on 04/11/2017 and underwent Procedure(s): RIGHT TOTAL KNEE ARTHROPLASTY.    Patient was given perioperative antibiotics:  Anti-infectives (From admission, onward)   Start     Dose/Rate Route Frequency Ordered Stop   04/11/17 1156  ceFAZolin (ANCEF) IVPB 2g/100 mL premix  Status:  Discontinued     2 g 200 mL/hr over 30 Minutes Intravenous On call to O.R. 04/11/17 1156 04/11/17 1223   04/11/17 1156  vancomycin (VANCOCIN) IVPB 1000 mg/200 mL premix     1,000 mg 200 mL/hr over 60 Minutes Intravenous 60 min pre-op 04/11/17 1156 04/11/17 1301    .  Patient was given sequential compression devices, early ambulation, and aspirin for DVT prophylaxis.  Recent vital signs:  Patient Vitals for the past 24 hrs:  BP Temp Temp src Pulse Resp SpO2 Height Weight  04/12/17 0454 129/83 98.9 F (37.2 C) Oral (!) 101 - 97 % - -  04/12/17 0229 118/71 97.8 F (36.6 C) Oral 100 18 99 % - -  04/12/17 0017 130/64 98.6 F (37 C) Oral (!) 106 16 99 % - -  04/11/17 2041 133/83 98.3 F (36.8 C) Oral (!) 110 18 98 % - -  04/11/17 1827 136/82 98.5 F (36.9 C) Oral (!) 110 16 96 % - -  04/11/17 1802 130/82 98 F (36.7 C) - (!) 107 15 99 % - -  04/11/17 1758 140/84 - - (!) 107 17 94 % - -  04/11/17 1743 (!) 146/79 - - (!) 108 19 94 % - -  04/11/17 1740 - - - (!) 109 18 93 % - -  04/11/17 1714 (!) 143/79 - - (!) 108 16 94 % - -  04/11/17 1700 135/77 - - (!) 110 16 100 % - -  04/11/17 1644 135/90 - - (!) 108 13 94 % - -  04/11/17  1629 130/90 99 F (37.2 C) - - 12 96 % - -  04/11/17 1159 - - - - - - 5\' 8"  (1.727 m) 240 lb (108.9 kg)  04/11/17 1128 117/76 98.4 F (36.9 C) Oral 80 19 97 % - -  .  Recent laboratory studies: No results found.  Discharge Medications:   Allergies as of 04/12/2017      Reactions   Adhesive [tape] Other (See Comments)   Blisters, NO STERI STRIPS  paper tape is ok Tegaderm - caused redness, itching, and burning sensation to the site, use gauze dressing over IV sites   Baclofen Nausea Only, Other (See Comments)   Aches and pains    Indocin [indomethacin] Other (See Comments)   Burns stomach      Medication List    TAKE these medications   ANDROGEL 20.25 MG/1.25GM (1.62%) Gel Generic drug:  Testosterone Place 2 Pump onto the skin daily.   EPIPEN 2-PAK 0.3 mg/0.3 mL Soaj injection Generic drug:  EPINEPHrine Inject 0.3 mg into the muscle as needed (allergic reaction).   GOODY HEADACHE PO Take 1 packet by mouth daily.   hydrochlorothiazide 25 MG tablet Commonly known as:  HYDRODIURIL Take 25 mg by mouth daily.   loratadine 10 MG tablet Commonly known as:  CLARITIN Take 10 mg by mouth daily.   LORazepam 0.5 MG tablet Commonly known as:  ATIVAN Take 0.5 mg by mouth daily as needed for anxiety.   morphine 15 MG tablet Commonly known as:  MSIR Take 1 tablet (15 mg total) by mouth every 4 (four) hours as needed (pai). What changed:    when to take this  reasons to take this   morphine 60 MG 24 hr capsule Commonly known as:  KADIAN Take 60 mg by mouth every morning. What changed:  Another medication with the same name was changed. Make sure you understand how and when to take each.   omeprazole 20 MG capsule Commonly known as:  PRILOSEC Take 20 mg by mouth daily.   PRESCRIPTION MEDICATION Inject 2 Doses as directed once a week. Allergy shots   valsartan 160 MG tablet Commonly known as:  DIOVAN Take 160 mg by mouth daily.       Diagnostic Studies: No results  found.  Patient benefited maximally from their hospital stay and there were no complications.     Disposition: 01-Home or Self Care Discharge Instructions    Call MD / Call 911   Complete by:  As directed    If you experience chest pain or shortness of breath, CALL 911 and be transported to the hospital emergency room.  If you develope a fever above 101 F, pus (white drainage) or increased drainage or redness at the wound, or calf pain, call your surgeon's office.   Constipation Prevention   Complete by:  As directed    Drink plenty of fluids.  Prune juice may be helpful.  You may use a stool softener, such as Colace (over the counter) 100 mg twice a day.  Use MiraLax (over the counter) for constipation as needed.   Diet - low sodium heart healthy   Complete by:  As directed    Increase activity slowly as tolerated   Complete by:  As directed      Follow-up Information    Jessy Oto, MD Follow up in 2 week(s).   Specialty:  Orthopedic Surgery Why:  For wound re-check Contact information: [place] [address] [phone]            Signed: Newt Minion 04/12/2017, 8:40 AM

## 2017-04-12 NOTE — Evaluation (Signed)
Occupational Therapy Evaluation Patient Details Name: Jesus Brown MRN: [id number] DOB: 1965-10-11 Today's Date: 04/12/2017    History of Present Illness 52 y.o. male s/p R TKA. PMH includes: HTN, GERD, DDD, Asthma, Arthritis, Anxiety, L TKA, Shoulder Surgery, Cervical fusion, ankle surgery, wrist surgery.    Clinical Impression   PTA, pt was living with his wife and was independent. Currently, pt requires supervision and set up for UB ADLs, Mod A for LB ADLs, and Min Guard for functional mobility using RW. Provided education on LB ADLs, toilet transfer, and tub transfer; pt demonstrated understanding. Answered all pt questions. Recommend dc home once medically stable per physician. All acute OT needs met and will sign off. Thank you.     Follow Up Recommendations  No OT follow up;Supervision - Intermittent    Equipment Recommendations  None recommended by OT    Recommendations for Other Services PT consult     Precautions / Restrictions Precautions Precautions: None Precaution Comments: Reviewed supine therex and no pillow under knee Restrictions Weight Bearing Restrictions: Yes RLE Weight Bearing: Weight bearing as tolerated      Mobility Bed Mobility Overal bed mobility: Modified Independent                Transfers Overall transfer level: Modified independent Equipment used: Rolling walker (2 wheeled)                  Balance Overall balance assessment: No apparent balance deficits (not formally assessed)                                         ADL either performed or assessed with clinical judgement   ADL Overall ADL's : Needs assistance/impaired Eating/Feeding: Set up;Sitting   Grooming: Min guard;Standing   Upper Body Bathing: Set up;Sitting;Supervision/ safety   Lower Body Bathing: Sit to/from stand;Moderate assistance   Upper Body Dressing : Set up;Supervision/safety;Sitting   Lower Body Dressing: Moderate assistance;Sit  to/from stand Lower Body Dressing Details (indicate cue type and reason): Pt requiring assistnace to don socks while seated at EOB. Pt verbalizing understanding of compensatory techniques to don RLE first. Pt also reports his wife will asssit initially. Toilet Transfer: Set up;Supervision/safety;Regular Toilet;RW;Grab bars;Ambulation Toilet Transfer Details (indicate cue type and reason): Discussed tehcniques for home toilet       Tub/Shower Transfer Details (indicate cue type and reason): Discussed safe tub transfer. Pt with garden tub with step. Pt plans to sponge bath intiially Functional mobility during ADLs: Min guard;Rolling walker General ADL Comments: Pt performing UB ADLs with supervision, LB ADLs with Mod A, and funcitonal mobility with RW and min guard. Feel pt will progress well with time     Vision         Perception     Praxis      Pertinent Vitals/Pain Pain Assessment: 0-10 Pain Score: 5  Pain Location: R knee Pain Descriptors / Indicators: Aching;Discomfort;Grimacing Pain Intervention(s): Monitored during session;Limited activity within patient's tolerance;Repositioned;Ice applied     Hand Dominance Right   Extremity/Trunk Assessment Upper Extremity Assessment Upper Extremity Assessment: Overall WFL for tasks assessed   Lower Extremity Assessment Lower Extremity Assessment: Defer to PT evaluation   Cervical / Trunk Assessment Cervical / Trunk Assessment: Normal   Communication Communication Communication: No difficulties   Cognition Arousal/Alertness: Awake/alert Behavior During Therapy: WFL for tasks assessed/performed Overall Cognitive Status: Within Functional Limits for  tasks assessed                                     General Comments  Son present throughout session    Exercises    Shoulder Instructions      [place] expects to be discharged to:: Private residence Living Arrangements: Spouse/significant  other Available Help at Discharge: Family;Available 24 hours/day Type of Home: House Home Access: Stairs to enter [employer] of Steps: 1 Entrance Stairs-Rails: None Home Layout: Two level;Able to live on main level with bedroom/bathroom     Bathroom Shower/Tub: Teacher, early years/pre: Standard     Home Equipment: Cane - single point;Hand held Tourist information centre manager - 2 wheels          Prior Functioning/Environment Level of Independence: Independent        Comments: ambulating with cane sometimes. completely independent and driving. Was not working        OT Problem List: Decreased strength;Decreased range of motion;Decreased activity tolerance;Impaired balance (sitting and/or standing);Decreased safety awareness;Decreased knowledge of use of DME or AE;Decreased knowledge of precautions;Pain      OT Treatment/Interventions:      OT Goals(Current goals can be found in the care plan section) Acute Rehab OT Goals Patient Stated Goal: go home today OT Goal Formulation: All assessment and education complete, DC therapy  OT Frequency:     Barriers to D/C:            Co-evaluation              AM-PAC PT "6 Clicks" Daily Activity     Outcome Measure Help from another person eating meals?: None Help from another person taking care of personal grooming?: None Help from another person toileting, which includes using toliet, bedpan, or urinal?: A Little Help from another person bathing (including washing, rinsing, drying)?: A Little Help from another person to put on and taking off regular upper body clothing?: None Help from another person to put on and taking off regular lower body clothing?: A Little 6 Click Score: 21   End of Session Equipment Utilized During Treatment: Rolling walker Nurse Communication: Mobility status  Activity Tolerance: Patient tolerated treatment well Patient left: in bed;with call bell/phone within reach;with  family/visitor present  OT Visit Diagnosis: Unsteadiness on feet (R26.81);Other abnormalities of gait and mobility (R26.89);Muscle weakness (generalized) (M62.81);Pain Pain - Right/Left: Left Pain - part of body: Knee                Time: 1000-1013 OT Time Calculation (min): 13 min Charges:  OT General Charges $OT Visit: 1 Visit OT Evaluation $OT Eval Low Complexity: 1 Low G-Codes:     Brad Lieurance MSOT, OTR/L Acute Rehab Pager: [phone] Office: [place] 04/12/2017, 10:18 AM

## 2017-04-14 ENCOUNTER — Telehealth (INDEPENDENT_AMBULATORY_CARE_PROVIDER_SITE_OTHER): Payer: Self-pay | Admitting: Specialist

## 2017-04-14 ENCOUNTER — Encounter (HOSPITAL_COMMUNITY): Payer: Self-pay | Admitting: Specialist

## 2017-04-14 NOTE — Telephone Encounter (Signed)
I advised Sonia Side from [facility] that this will work and he will notify Verdis Frederickson

## 2017-04-14 NOTE — Telephone Encounter (Signed)
Jesus Brown from [facility] called asking for verbal approval of 3 week 2. CB # [phone]

## 2017-04-16 ENCOUNTER — Telehealth (INDEPENDENT_AMBULATORY_CARE_PROVIDER_SITE_OTHER): Payer: Self-pay | Admitting: Specialist

## 2017-04-16 NOTE — Telephone Encounter (Signed)
Jesus Brown with [facility] called to follow up on verbal orders for HHPT 3 wk 2. The number tom contact Verdis Frederickson is [phone]

## 2017-04-16 NOTE — Telephone Encounter (Signed)
I called and advised that this order was  [facility]

## 2017-04-22 ENCOUNTER — Telehealth (INDEPENDENT_AMBULATORY_CARE_PROVIDER_SITE_OTHER): Payer: Self-pay

## 2017-04-22 NOTE — Telephone Encounter (Signed)
Jesus Brown with [facility] would like a referral to be faxed to [facility] outpatient rehab on [address].  Stated that patient will be discharged from [facility] on Friday, 04/25/17 and patient would like to have his outpatient therapy completed there.  Wanted to let Dr. Louanne Skye know that he has been having some right foot pain, but the pain comes and goes.  Cb# is [phone].  Please advise.  Thank you.

## 2017-04-22 NOTE — Telephone Encounter (Signed)
Pt has appt on 04/24/17 with [name] we will address at his appt.

## 2017-04-24 ENCOUNTER — Encounter (INDEPENDENT_AMBULATORY_CARE_PROVIDER_SITE_OTHER): Payer: Self-pay | Admitting: Surgery

## 2017-04-24 ENCOUNTER — Inpatient Hospital Stay (INDEPENDENT_AMBULATORY_CARE_PROVIDER_SITE_OTHER): Payer: Medicare Other | Admitting: Surgery

## 2017-04-24 ENCOUNTER — Ambulatory Visit (INDEPENDENT_AMBULATORY_CARE_PROVIDER_SITE_OTHER): Payer: Medicare Other | Admitting: Surgery

## 2017-04-24 VITALS — BP 116/76 | HR 100 | Ht 68.0 in | Wt 230.0 lb

## 2017-04-24 DIAGNOSIS — Z96651 Presence of right artificial knee joint: Secondary | ICD-10-CM

## 2017-04-24 DIAGNOSIS — M79671 Pain in right foot: Secondary | ICD-10-CM

## 2017-04-24 NOTE — Progress Notes (Signed)
Post-Op Visit Note   Patient: Jesus Brown           Date of Birth: 12/19/1965           MRN: [id number] Visit Date: 04/24/2017 PCP: Merrilee Seashore, MD   Assessment & Plan:  Chief Complaint:  Chief Complaint  Patient presents with  . Right Knee - Routine Post Op, Wound Check  52 year old male who is a little over 2 weeks status post right total knee replacement returns. States that he is doing well and progressing with rehabilitation. He is complaining of right foot pain. He has a history of gout and thinks that this is causing his problem currently. Localizes most of his pain is around the second and third metatarsal heads and lesser between the third and fourth. Pain when he is up and ambulating. He describes a feeling of a rock in the bottom of his foot. Visit Diagnoses:  1. S/P TKR (total knee replacement), right   2. Pain in right foot   possible 2nd and 3rd Morton's neuroma  Plan: I gave patient some literature for Morton's neuroma. We'll follow with me in 1 week for recheck. If he still continues to have foot pain I did discuss possibly trying a Marcaine/Depo-Medrol injection between the second and third metatarsal heads. He was concerned about his foot pain possibly being from gout but I actually do not think this is the case. He was given a prescription to start outpatient PT. Did not need refill of medication.  Follow-Up Instructions: No Follow-up on file.   Orders:  No orders of the defined types were placed in this encounter.  No orders of the defined types were placed in this encounter.   Imaging: No results found.  PMFS History: Patient Active Problem List   Diagnosis Date Noted  . S/P total knee replacement using cement, right 04/11/2017  . Status post total left knee replacement 09/26/2016  . Presence of left artificial knee joint 07/25/2016  . Pain and swelling of left lower leg 07/25/2016  . Bilateral primary osteoarthritis of knee 07/19/2016   Class: Chronic  . Unilateral primary osteoarthritis, right knee 07/19/2016  . 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 Hypotestosteronemia 09/08/2015  . Cervical spondylosis without myelopathy 03/25/2014    Class: Chronic  . Left cervical radiculopathy 03/25/2014    Class: Chronic  . Cervical spondylosis with radiculopathy 03/25/2014   Past Medical History:  Diagnosis Date  . Anxiety    mainly r/t pain  . Arthritis    neck, knees  . Asthma    AS CHILD   . Back pain, chronic   . DDD (degenerative disc disease)   . Dysphagia   . 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 GERD (gastroesophageal reflux disease)   . Hypertension   . Hypotestosteronemia 09/08/2015  . Neck pain, chronic     No family history on file.  Past Surgical History:  Procedure Laterality Date  . ANKLE SURGERY    . BACK SURGERY  2010,2011   lumb fusionx2  . CERVICAL FUSION  8119,1478   3 surgeries  . DIRECT LARYNGOSCOPY N/A 11/09/2012   Procedure: DIRECT LARYNGOSCOPY;  Surgeon: Jodi Marble, MD;  Location: [facility];  Service: ENT;  Laterality: N/A;  . ESOPHAGOGASTRODUODENOSCOPY N/A 08/03/2012   Procedure: ESOPHAGOGASTRODUODENOSCOPY (EGD);  Surgeon: Arta Silence, MD;  Location: [facility] ENDOSCOPY;  Service: Endoscopy;  Laterality: N/A;  . ESOPHAGOSCOPY W/ BOTOX INJECTION N/A 11/09/2012   Procedure: ESOPHAGOSCOPY POSSIBLE  BIOPSY AND POSSIBLE BOTOX INJECTION CRICOPHARYNGEUS;  Surgeon: Jodi Marble, MD;  Location: [place];  Service: ENT;  Laterality: N/A;  Esophagoscopy with botox injection  . HERNIA REPAIR     ing-rt  . KNEE SURGERY     rt and left x2 each=4  . POSTERIOR CERVICAL FUSION/FORAMINOTOMY N/A 03/25/2014   Procedure: LEFT C7-T1 FORAMINOTOMY;  Surgeon: Jessy Oto, MD;  Location: [facility];  Service: Orthopedics;  Laterality: N/A;  . [place]   rt and lt  . TONSILLECTOMY    . TOTAL KNEE ARTHROPLASTY Left 07/19/2016   Procedure:  LEFT TOTAL KNEE ARTHROPLASTY CORTISON INJECTION IN RIGHT KNEE;  Surgeon: Jessy Oto, MD;  Location: [place];  Service: Orthopedics;  Laterality: Left;  MAY NEED RNFA TO FINISH CASE PER [place]   . TOTAL KNEE ARTHROPLASTY Right 04/11/2017   Procedure: RIGHT TOTAL KNEE ARTHROPLASTY;  Surgeon: Jessy Oto, MD;  Location: [facility];  Service: Orthopedics;  Laterality: Right;  . WRIST SURGERY     lt and rt   Social History   Occupational History  . Not on file  Tobacco Use  . Smoking status: Never Smoker  . Smokeless tobacco: Never Used  Substance and Sexual Activity  . Alcohol use: No  . Drug use: No  . Sexual activity: Not on file   Exam Very pleasant white male alert and oriented in no acute distress. Right knee wound looks good and Staples removed.  Steri-Strips applied. No drainage or signs of infection. Right foot he is exquisitely tender between the second and third metatarsal heads. Less tender between the third and fourth. Right first MTP joint nontender.

## 2017-04-25 NOTE — Telephone Encounter (Signed)
This was given to patient at his appt.

## 2017-04-28 ENCOUNTER — Encounter: Payer: Self-pay | Admitting: Physical Therapy

## 2017-04-28 ENCOUNTER — Ambulatory Visit: Payer: BLUE CROSS/BLUE SHIELD | Attending: Specialist | Admitting: Physical Therapy

## 2017-04-28 DIAGNOSIS — R262 Difficulty in walking, not elsewhere classified: Secondary | ICD-10-CM | POA: Diagnosis present

## 2017-04-28 DIAGNOSIS — M25662 Stiffness of left knee, not elsewhere classified: Secondary | ICD-10-CM | POA: Insufficient documentation

## 2017-04-28 DIAGNOSIS — R600 Localized edema: Secondary | ICD-10-CM | POA: Insufficient documentation

## 2017-04-28 DIAGNOSIS — M25562 Pain in left knee: Secondary | ICD-10-CM | POA: Insufficient documentation

## 2017-04-28 NOTE — Therapy (Signed)
[place], [place], [address] Phone: [phone]   Fax:  [phone]  Physical Therapy Evaluation  Patient Details  Name: Jesus Brown MRN: [id number] Date of Birth: 1965/11/01 Referring Provider: Dr. Basil Dess   Encounter Date: 04/28/2017  PT End of Session - 04/28/17 1222    Visit Number  1    Number of Visits  24    Date for PT Re-Evaluation  06/23/17    PT Start Time  4166    PT Stop Time  0630    PT Time Calculation (min)  50 min    Activity Tolerance  Patient tolerated treatment well    Behavior During Therapy  [facility] for tasks assessed/performed       Past Medical History:  Diagnosis Date  . Anxiety    mainly r/t pain  . Arthritis    neck, knees  . Asthma    AS CHILD   . Back pain, chronic   . DDD (degenerative disc disease)   . Dysphagia   . 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 GERD (gastroesophageal reflux disease)   . Hypertension   . Hypotestosteronemia 09/08/2015  . Neck pain, chronic     Past Surgical History:  Procedure Laterality Date  . ANKLE SURGERY    . BACK SURGERY  2010,2011   lumb fusionx2  . CERVICAL FUSION  1601,0932   3 surgeries  . DIRECT LARYNGOSCOPY N/A 11/09/2012   Procedure: DIRECT LARYNGOSCOPY;  Surgeon: Jodi Marble, MD;  Location: [place];  Service: ENT;  Laterality: N/A;  . ESOPHAGOGASTRODUODENOSCOPY N/A 08/03/2012   Procedure: ESOPHAGOGASTRODUODENOSCOPY (EGD);  Surgeon: Arta Silence, MD;  [facility] ENDOSCOPY;  Service: Endoscopy;  Laterality: N/A;  . ESOPHAGOSCOPY W/ BOTOX INJECTION N/A 11/09/2012   Procedure: ESOPHAGOSCOPY POSSIBLE BIOPSY AND POSSIBLE BOTOX INJECTION CRICOPHARYNGEUS;  Surgeon: Jodi Marble, MD;  Location: [place];  Service: ENT;  Laterality: N/A;  Esophagoscopy with botox injection  . HERNIA REPAIR     ing-rt  . KNEE SURGERY     rt and left x2 each=4  . POSTERIOR CERVICAL  FUSION/FORAMINOTOMY N/A 03/25/2014   Procedure: LEFT C7-T1 FORAMINOTOMY;  Surgeon: Jessy Oto, MD;  Location: [facility];  Service: Orthopedics;  Laterality: N/A;  . [place]   rt and lt  . TONSILLECTOMY    . TOTAL KNEE ARTHROPLASTY Left 07/19/2016   Procedure: LEFT TOTAL KNEE ARTHROPLASTY CORTISON INJECTION IN RIGHT KNEE;  Surgeon: Jessy Oto, MD;  Location: [facility];  Service: Orthopedics;  Laterality: Left;  MAY NEED RNFA TO FINISH CASE PER [place]   . TOTAL KNEE ARTHROPLASTY Right 04/11/2017   Procedure: RIGHT TOTAL KNEE ARTHROPLASTY;  Surgeon: Jessy Oto, MD;  Location: [facility];  Service: Orthopedics;  Laterality: Right;  . WRIST SURGERY     lt and rt    There were no vitals filed for this visit.   Subjective Assessment - 04/28/17 1150    Subjective  Pt underwent TKA of Rt knee on 04/11/17, had some bleeding issues, used staples to close.  Pt had 2 weeks of HHPT. He had a CPM and cont to use it daily. He complains of stiffness with sitting, standing too long.  Gets fatigued easily.  He said this knee has done alot better than his other than the L.  He wears compression stocking 23 hours per day.      Pertinent History  Morton's Neuroma, Previous L  TKA 2017.  Lumbar fusion , cervical fusion.  Chronic pain.     Limitations  Sitting;Lifting;Standing;Walking;House hold activities;Other (comment) sleeping has to be in recliner     How long can you sit comfortably?  10-15 min knee stiffens up on him     How long can you stand comfortably?  up to 15 min     How long can you walk comfortably?  does not push this much , in his home, 10-15 min     Patient Stated Goals  Pt would like to get full movement in my knee without pain     Currently in Pain?  Yes    Pain Score  4     Pain Location  Knee    Pain Orientation  Right    Pain Descriptors / Indicators  Aching;Dull    Pain Type  Surgical pain    Pain Onset  1 to 4 weeks ago    Pain Frequency  Constant    Aggravating Factors   sit  to long, over activity     Pain Relieving Factors  RICE, compression     Effect of Pain on Daily Activities  limited in activity     Multiple Pain Sites  Yes    Pain Score  4    Pain Location  Back    Pain Orientation  Right    Pain Descriptors / Indicators  Aching    Pain Type  Chronic pain         [facility] PT Assessment - 04/28/17 0001      Assessment   Medical Diagnosis  Rt. TKA    Referring Provider  Dr. Basil Dess    Onset Date/Surgical Date  04/11/17    Prior Therapy  Yes, for LLE and also had 2 weeks HHPT       Precautions   Precautions  None      Restrictions   Weight Bearing Restrictions  No      Balance Screen   Has the patient fallen in the past 6 months  No      [place] residence    Living Arrangements  Spouse/significant other    Available Help at Discharge  Family    Type of [place] to enter    Entrance Stairs-Number of Steps  2    Entrance Stairs-Rails  Can reach both    Home Layout  Two level    Alternate Level Stairs-Number of Steps  12    Alternate Level Stairs-Rails  Can reach both    [place] - standard      Prior Function   Level of Independence  Independent;Independent with basic ADLs;Independent with household mobility with device;Independent with community mobility without device    Vocation  On disability    Leisure  family, travel, being outdoors      Cognition   Overall Cognitive Status  Within Functional Limits for tasks assessed      Observation/Other Assessments   Focus on Therapeutic Outcomes (FOTO)   85%      Observation/Other Assessments-Edema    Edema  Circumferential      Circumferential Edema   Circumferential - Right  16.75 inch    Circumferential - Left   16.5  inch      Sensation   Light Touch  Appears Intact      Posture/Postural Control   Posture/Postural Control  Postural limitations    Postural Limitations  Increased thoracic  kyphosis;Flexed trunk;Weight shift right    Posture Comments  knees flexed in standing       Strength   Right Hip Flexion  4/5    Right Hip ABduction  3+/5    Left Hip Flexion  3+/5    Left Hip ABduction  4/5    Right Knee Flexion  5/5    Right Knee Extension  4-/5    Left Knee Flexion  4/5    Left Knee Extension  4-/5      Palpation   Patella mobility  hypomobile , decent quad set     Palpation comment  well healed scar , 3 steristrips in place      Ambulation/Gait   Ambulation Distance (Feet)  150 Feet    Assistive device  Straight cane    Gait Pattern  Step-through pattern;Decreased stance time - right;Decreased stride length;Decreased weight shift to right    Ambulation Surface  Level;Indoor             Objective measurements completed on examination: See above findings.      [place] Adult PT Treatment/Exercise - 04/28/17 0001      Knee/Hip Exercises: Stretches   Active Hamstring Stretch  3 reps      Knee/Hip Exercises: Aerobic   Stationary Bike  5 min able to get full rev after 2 min with come discomfort       Vasopneumatic   Number Minutes Vasopneumatic   15 minutes    Vasopnuematic Location   Knee    Vasopneumatic Pressure  Low    Vasopneumatic Temperature   coldest      Manual Therapy   Joint Mobilization  Gr I extension gentle, brief              PT Education - 04/28/17 1222    Education provided  Yes    Education Details  PT/POC, HEP, RICE    Person(s) Educated  Patient    Methods  Explanation;Demonstration;Handout    Comprehension  Verbalized understanding;Returned demonstration       PT Short Term Goals - 04/28/17 1225      PT SHORT TERM GOAL #1   Title  Pt will be I with initial HEP for ROM and strength in L. knee     Time  4    Period  Weeks    Status  New    Target Date  05/26/17      PT SHORT TERM GOAL #2   Title  Pt will be able to go without compression stocking for up to 3 hours and manage edema     Time  4    Period   Weeks    Status  New    Target Date  05/26/17      PT SHORT TERM GOAL #3   Title  Pt will achieve no more than 10 deg knee extension for improved gait, flexibility.     Time  4    Period  Weeks    Status  New    Target Date  05/26/17        PT Long Term Goals - 04/28/17 1227      PT LONG TERM GOAL #1   Title  Pt will understand RICE, posture and body mechanics as it pertains to knee and back pain.     Time  8    Period  Weeks    Status  New  Target Date  06/23/17      PT LONG TERM GOAL #2   Title  Pt will be I with more advanced HEP for knee, core.     Time  8    Period  Weeks    Status  New    Target Date  06/23/17      PT LONG TERM GOAL #3   Title  Pt will less than 60% limited on FOTO to demo functional improvement    Baseline  85%    Period  Weeks    Status  New    Target Date  06/23/17      PT LONG TERM GOAL #4   Title  Pt will be able to go outside and participate in family functions without limitations of knee pain, including walking on uneven ground.     Time  8    Period  Weeks    Status  New    Target Date  06/23/17      PT LONG TERM GOAL #5   Title  Pt will be able to walk for 30 min for general health and fitness without lasting knee pain     Time  8    Period  Weeks    Status  New    Target Date  06/23/17             Plan - 04/28/17 1330    Clinical Impression Statement  Patient presents for low complexity eval of Rt. TKA which took place 04/11/17.  He is struggling most with ROM, extension predominantly.  He has less pain and swelling than previous knee surgery.  He is only able to walk, stand for up to 10-15 min at a time.  Tried to walk every hour as best he can.  Right now he weas compression sleeve and RICE for managing pain, swelling.  He is limited by back pain which is chronic.  He is motivated to improve his mobility and is hopeful to have a good outcome to maximize his ability to return to recreation and home tasks with minimal pain.       Clinical Presentation  Stable    Clinical Decision Making  Low    Rehab Potential  Excellent    PT Frequency  3x / week may back to 2 times a week as he progresses.     PT Duration  8 weeks    PT Treatment/Interventions  ADLs/Self Care Home Management;Cryotherapy;Moist Heat;Electrical Stimulation;Therapeutic activities;Therapeutic exercise;Balance training;Patient/family education;Manual techniques;Passive range of motion;Neuromuscular re-education;Taping;Vasopneumatic Device;Functional mobility training;Gait training;Stair training;DME Instruction    PT Next Visit Plan  ROM, manual, quads in standing     PT Home Exercise Plan  hamstring stretch, quad set, LAQ (has full HEP from previous episode but told to focus on knee ext ) , wall slide for quads     Consulted and Agree with Plan of Care  Patient       Patient will benefit from skilled therapeutic intervention in order to improve the following deficits and impairments:  Abnormal gait, Decreased mobility, Difficulty walking, Impaired sensation, Improper body mechanics, Decreased scar mobility, Decreased range of motion, Decreased activity tolerance, Decreased strength, Increased fascial restricitons, Impaired flexibility, Pain  Visit Diagnosis: Difficulty in walking, not elsewhere classified  Acute pain of left knee  Localized edema  Stiffness of left knee, not elsewhere classified     Problem List Patient Active Problem List   Diagnosis Date Noted  . S/P total knee replacement using  cement, right 04/11/2017  . Status post total left knee replacement 09/26/2016  . Presence of left artificial knee joint 07/25/2016  . Pain and swelling of left lower leg 07/25/2016  . Bilateral primary osteoarthritis of knee 07/19/2016    Class: Chronic  . Unilateral primary osteoarthritis, right knee 07/19/2016  . 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 Hypotestosteronemia 09/08/2015  . Cervical spondylosis without  myelopathy 03/25/2014    Class: Chronic  . Left cervical radiculopathy 03/25/2014    Class: Chronic  . Cervical spondylosis with radiculopathy 03/25/2014    Aziyah Provencal 04/28/2017, 3:27 PM  [facility] [address] [place], [place], [address] Phone: [phone]   Fax:  [phone]  Name: Jesus Brown MRN: [id number] Date of Birth: [birth year]   Raeford Razor, PT 04/28/17 3:27 PM Phone: [phone] Fax: [phone]

## 2017-05-01 ENCOUNTER — Ambulatory Visit (INDEPENDENT_AMBULATORY_CARE_PROVIDER_SITE_OTHER): Payer: BLUE CROSS/BLUE SHIELD | Admitting: Surgery

## 2017-05-01 ENCOUNTER — Ambulatory Visit (INDEPENDENT_AMBULATORY_CARE_PROVIDER_SITE_OTHER): Payer: BLUE CROSS/BLUE SHIELD

## 2017-05-01 ENCOUNTER — Encounter (INDEPENDENT_AMBULATORY_CARE_PROVIDER_SITE_OTHER): Payer: Self-pay | Admitting: Surgery

## 2017-05-01 DIAGNOSIS — M79671 Pain in right foot: Secondary | ICD-10-CM

## 2017-05-01 DIAGNOSIS — Z96651 Presence of right artificial knee joint: Secondary | ICD-10-CM | POA: Diagnosis not present

## 2017-05-01 DIAGNOSIS — G5761 Lesion of plantar nerve, right lower limb: Secondary | ICD-10-CM

## 2017-05-01 NOTE — Progress Notes (Signed)
Office Visit Note   Patient: Jesus Brown           Date of Birth: [birth year]           MRN: [id number] Visit Date: 05/01/2017              Requested by: Merrilee Seashore, [address] [place], [place] [address] PCP: Merrilee Seashore, MD   Assessment & Plan: Visit Diagnoses:  1. Status post right knee replacement   2. Pain in right foot   3. Morton's neuroma of right foot     Plan: With patient's ongoing right foot pain I elected to try injection as we discussed last office visit. After patient consent right dorsal foot was prepped with Betadine and Marcaine/Depo-Medrol 1/2:1/2 injection was performed between the second and third metatarsal heads. After sitting for a few minutes patient reported excellent relief with Marcaine in place. Continue doing rehabilitation for right total knee. The foot pain returns we will get him into see Dr. Meridee Score to discuss possible Morton's neuroma excision. All questions answered.  Follow-Up Instructions: Return in about 2 weeks (around 05/15/2017) for [name] 2 weeks.   Orders:  Orders Placed This Encounter  Procedures  . XR Knee 1-2 Views Right  . XR Foot Complete Right   No orders of the defined types were placed in this encounter.     Procedures: Foot Inj Date/Time: 05/01/2017 11:09 AM Performed by: Lanae Crumbly, PA-C Authorized by: Lanae Crumbly, PA-C   Consent Given by:  Patient Indications:  Neuroma Condition: Morton's Neuroma   Location:  R foot Prep: patient was prepped and draped in usual sterile fashion   Needle Size:  25 G Approach:  Dorsal Medications:  40 mg methylPREDNISolone acetate 40 MG/ML; 0.33 mL bupivacaine 0.25 % Patient Tolerance:  Patient tolerated the procedure well with no immediate complications     Clinical Data: No additional findings.   Subjective: Chief Complaint  Patient presents with  . Right Knee - Routine Post Op    HPI Patient returns for evaluation of right  foot pain. Last office visit I thought his pain was related to a second and third Morton's neuroma. Continues to have ongoing symptoms that are unchanged. He comes in for injection as we've previously discussed.  He continues to work with physical therapy for his right knee. Review of Systems No current cardiac pulmonary GI GU issues  Objective: Vital Signs: There were no vitals taken for this visit.  Physical Exam  Ortho Exam  Specialty Comments:  No specialty comments available.  Imaging: No results found.   PMFS History: Patient Active Problem List   Diagnosis Date Noted  . S/P total knee replacement using cement, right 04/11/2017  . Status post total left knee replacement 09/26/2016  . Presence of left artificial knee joint 07/25/2016  . Pain and swelling of left lower leg 07/25/2016  . Bilateral primary osteoarthritis of knee 07/19/2016    Class: Chronic  . Unilateral primary osteoarthritis, right knee 07/19/2016  . 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 Hypotestosteronemia 09/08/2015  . Cervical spondylosis without myelopathy 03/25/2014    Class: Chronic  . Left cervical radiculopathy 03/25/2014    Class: Chronic  . Cervical spondylosis with radiculopathy 03/25/2014   Past Medical History:  Diagnosis Date  . Anxiety    mainly r/t pain  . Arthritis    neck, knees  . Asthma    AS CHILD   . Back pain,  chronic   . DDD (degenerative disc disease)   . Dysphagia   . 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 GERD (gastroesophageal reflux disease)   . Hypertension   . Hypotestosteronemia 09/08/2015  . Neck pain, chronic     History reviewed. No pertinent family history.  Past Surgical History:  Procedure Laterality Date  . ANKLE SURGERY    . BACK SURGERY  2010,2011   lumb fusionx2  . CERVICAL FUSION  2595,6387   3 surgeries  . DIRECT LARYNGOSCOPY N/A 11/09/2012   Procedure: DIRECT LARYNGOSCOPY;  Surgeon: Jodi Marble, MD;  Location:  [facility];  Service: ENT;  Laterality: N/A;  . ESOPHAGOGASTRODUODENOSCOPY N/A 08/03/2012   Procedure: ESOPHAGOGASTRODUODENOSCOPY (EGD);  Surgeon: Arta Silence, MD;  Location: [facility] ENDOSCOPY;  Service: Endoscopy;  Laterality: N/A;  . ESOPHAGOSCOPY W/ BOTOX INJECTION N/A 11/09/2012   Procedure: ESOPHAGOSCOPY POSSIBLE BIOPSY AND POSSIBLE BOTOX INJECTION CRICOPHARYNGEUS;  Surgeon: Jodi Marble, MD;  Location: [facility];  Service: ENT;  Laterality: N/A;  Esophagoscopy with botox injection  . HERNIA REPAIR     ing-rt  . KNEE SURGERY     rt and left x2 each=4  . POSTERIOR CERVICAL FUSION/FORAMINOTOMY N/A 03/25/2014   Procedure: LEFT C7-T1 FORAMINOTOMY;  Surgeon: Jessy Oto, MD;  Location: [facility];  Service: Orthopedics;  Laterality: N/A;  . [place]   rt and lt  . TONSILLECTOMY    . TOTAL KNEE ARTHROPLASTY Left 07/19/2016   Procedure: LEFT TOTAL KNEE ARTHROPLASTY CORTISON INJECTION IN RIGHT KNEE;  Surgeon: Jessy Oto, MD;  Location: [facility];  Service: Orthopedics;  Laterality: Left;  MAY NEED RNFA TO FINISH CASE PER [place]   . TOTAL KNEE ARTHROPLASTY Right 04/11/2017   Procedure: RIGHT TOTAL KNEE ARTHROPLASTY;  Surgeon: Jessy Oto, MD;  Location: [facility];  Service: Orthopedics;  Laterality: Right;  . WRIST SURGERY     lt and rt   Social History   Occupational History  . Not on file  Tobacco Use  . Smoking status: Never Smoker  . Smokeless tobacco: Never Used  Substance and Sexual Activity  . Alcohol use: No  . Drug use: No  . Sexual activity: Not on file

## 2017-05-03 ENCOUNTER — Encounter (HOSPITAL_COMMUNITY): Payer: Self-pay | Admitting: Emergency Medicine

## 2017-05-03 ENCOUNTER — Inpatient Hospital Stay (HOSPITAL_COMMUNITY)
Admission: EM | Admit: 2017-05-03 | Discharge: 2017-05-05 | DRG: 603 | Disposition: A | Discharge status: Home / Self Care | Payer: BLUE CROSS/BLUE SHIELD | Attending: Internal Medicine | Admitting: Internal Medicine

## 2017-05-03 ENCOUNTER — Other Ambulatory Visit: Payer: Self-pay

## 2017-05-03 ENCOUNTER — Emergency Department (HOSPITAL_COMMUNITY): Payer: BLUE CROSS/BLUE SHIELD

## 2017-05-03 DIAGNOSIS — Z96651 Presence of right artificial knee joint: Secondary | ICD-10-CM

## 2017-05-03 DIAGNOSIS — E349 Endocrine disorder, unspecified: Secondary | ICD-10-CM

## 2017-05-03 DIAGNOSIS — J45909 Unspecified asthma, uncomplicated: Secondary | ICD-10-CM | POA: Diagnosis present

## 2017-05-03 DIAGNOSIS — L03115 Cellulitis of right lower limb: Principal | ICD-10-CM | POA: Diagnosis present

## 2017-05-03 DIAGNOSIS — Z981 Arthrodesis status: Secondary | ICD-10-CM

## 2017-05-03 DIAGNOSIS — E876 Hypokalemia: Secondary | ICD-10-CM | POA: Diagnosis present

## 2017-05-03 DIAGNOSIS — I959 Hypotension, unspecified: Secondary | ICD-10-CM | POA: Diagnosis present

## 2017-05-03 DIAGNOSIS — E785 Hyperlipidemia, unspecified: Secondary | ICD-10-CM | POA: Diagnosis present

## 2017-05-03 DIAGNOSIS — K219 Gastro-esophageal reflux disease without esophagitis: Secondary | ICD-10-CM | POA: Diagnosis present

## 2017-05-03 DIAGNOSIS — Z96653 Presence of artificial knee joint, bilateral: Secondary | ICD-10-CM | POA: Diagnosis present

## 2017-05-03 DIAGNOSIS — G8929 Other chronic pain: Secondary | ICD-10-CM | POA: Diagnosis present

## 2017-05-03 DIAGNOSIS — Z8781 Personal history of (healed) traumatic fracture: Secondary | ICD-10-CM | POA: Diagnosis not present

## 2017-05-03 DIAGNOSIS — F419 Anxiety disorder, unspecified: Secondary | ICD-10-CM | POA: Diagnosis present

## 2017-05-03 DIAGNOSIS — M79661 Pain in right lower leg: Secondary | ICD-10-CM | POA: Diagnosis not present

## 2017-05-03 DIAGNOSIS — I10 Essential (primary) hypertension: Secondary | ICD-10-CM | POA: Diagnosis present

## 2017-05-03 DIAGNOSIS — M7989 Other specified soft tissue disorders: Secondary | ICD-10-CM | POA: Diagnosis not present

## 2017-05-03 DIAGNOSIS — D649 Anemia, unspecified: Secondary | ICD-10-CM | POA: Diagnosis present

## 2017-05-03 DIAGNOSIS — Z888 Allergy status to other drugs, medicaments and biological substances status: Secondary | ICD-10-CM | POA: Diagnosis not present

## 2017-05-03 DIAGNOSIS — I878 Other specified disorders of veins: Secondary | ICD-10-CM | POA: Diagnosis present

## 2017-05-03 DIAGNOSIS — A419 Sepsis, unspecified organism: Secondary | ICD-10-CM | POA: Diagnosis present

## 2017-05-03 DIAGNOSIS — Z91048 Other nonmedicinal substance allergy status: Secondary | ICD-10-CM | POA: Diagnosis not present

## 2017-05-03 DIAGNOSIS — Z886 Allergy status to analgesic agent status: Secondary | ICD-10-CM | POA: Diagnosis not present

## 2017-05-03 DIAGNOSIS — S82891A Other fracture of right lower leg, initial encounter for closed fracture: Secondary | ICD-10-CM

## 2017-05-03 DIAGNOSIS — M79609 Pain in unspecified limb: Secondary | ICD-10-CM | POA: Diagnosis not present

## 2017-05-03 LAB — COMPREHENSIVE METABOLIC PANEL
ALT: 21 U/L (ref 17–63)
ANION GAP: 12 (ref 5–15)
AST: 24 U/L (ref 15–41)
Albumin: 3.5 g/dL (ref 3.5–5.0)
Alkaline Phosphatase: 100 U/L (ref 38–126)
BUN: 25 mg/dL — ABNORMAL HIGH (ref 6–20)
CO2: 28 mmol/L (ref 22–32)
Calcium: 9 mg/dL (ref 8.9–10.3)
Chloride: 94 mmol/L — ABNORMAL LOW (ref 101–111)
Creatinine, Ser: 1.11 mg/dL (ref 0.61–1.24)
GFR calc Af Amer: >60 mL/min (ref >60)
GFR calc non Af Amer: >60 mL/min (ref >60)
Glucose, Bld: 124 mg/dL — ABNORMAL HIGH (ref 65–99)
POTASSIUM: 3 mmol/L — AB (ref 3.5–5.1)
SODIUM: 134 mmol/L — AB (ref 135–145)
TOTAL PROTEIN: 6.9 g/dL (ref 6.5–8.1)
Total Bilirubin: 1.1 mg/dL (ref 0.3–1.2)

## 2017-05-03 LAB — CBC WITH DIFFERENTIAL/PLATELET
BASOS PCT: 0 %
Basophils Absolute: 0 10*3/uL (ref 0.0–0.1)
EOS ABS: 0.1 10*3/uL (ref 0.0–0.7)
EOS PCT: 0 %
HCT: 43.3 % (ref 39.0–52.0)
Hemoglobin: 14.2 g/dL (ref 13.0–17.0)
Lymphocytes Relative: 10 %
Lymphs Abs: 1.9 10*3/uL (ref 0.7–4.0)
MCH: 26.9 pg (ref 26.0–34.0)
MCHC: 32.8 g/dL (ref 30.0–36.0)
MCV: 82 fL (ref 78.0–100.0)
MONO ABS: 1.3 10*3/uL — AB (ref 0.1–1.0)
MONOS PCT: 7 %
NEUTROS PCT: 83 %
Neutro Abs: 16.1 10*3/uL — ABNORMAL HIGH (ref 1.7–7.7)
PLATELETS: 284 10*3/uL (ref 150–400)
RBC: 5.28 MIL/uL (ref 4.22–5.81)
RDW: 14.7 % (ref 11.5–15.5)
WBC: 19.3 10*3/uL — ABNORMAL HIGH (ref 4.0–10.5)

## 2017-05-03 LAB — C-REACTIVE PROTEIN: CRP: 19.7 mg/dL — AB (ref <1.0)

## 2017-05-03 LAB — I-STAT CG4 LACTIC ACID, ED
Lactic Acid, Venous: 1.89 mmol/L (ref 0.5–1.9)
Lactic Acid, Venous: 2.66 mmol/L (ref 0.5–1.9)

## 2017-05-03 LAB — SEDIMENTATION RATE: SED RATE: 9 mm/h (ref 0–16)

## 2017-05-03 LAB — LACTIC ACID, PLASMA
LACTIC ACID, VENOUS: 1.1 mmol/L (ref 0.5–1.9)
LACTIC ACID, VENOUS: 1.9 mmol/L (ref 0.5–1.9)

## 2017-05-03 LAB — MAGNESIUM: Magnesium: 1.9 mg/dL (ref 1.7–2.4)

## 2017-05-03 LAB — PROCALCITONIN: Procalcitonin: 1.61 ng/mL

## 2017-05-03 MED ORDER — ENOXAPARIN SODIUM 40 MG/0.4ML ~~LOC~~ SOLN
40.0000 mg | SUBCUTANEOUS | Status: DC
  Administered 2017-05-03 – 2017-05-04 (×2): 40 mg via SUBCUTANEOUS
  Filled 2017-05-03 (×2): qty 0.4

## 2017-05-03 MED ORDER — SODIUM CHLORIDE 0.9 % IV SOLN
INTRAVENOUS | Status: DC
  Administered 2017-05-04: via INTRAVENOUS

## 2017-05-03 MED ORDER — TESTOSTERONE 20.25 MG/1.25GM (1.62%) TD GEL: 2.0000 | Freq: Every day | TRANSDERMAL | Status: DC

## 2017-05-03 MED ORDER — VANCOMYCIN HCL IN DEXTROSE 750-5 MG/150ML-% IV SOLN
750.0000 mg | Freq: Two times a day (BID) | INTRAVENOUS | Status: DC
  Filled 2017-05-03: qty 150

## 2017-05-03 MED ORDER — MORPHINE SULFATE ER 15 MG PO TBCR
30.0000 mg | EXTENDED_RELEASE_TABLET | Freq: Two times a day (BID) | ORAL | Status: DC
  Administered 2017-05-03 – 2017-05-05 (×4): 30 mg via ORAL
  Filled 2017-05-03 (×4): qty 2

## 2017-05-03 MED ORDER — LORATADINE 10 MG PO TABS
10.0000 mg | ORAL_TABLET | Freq: Every day | ORAL | Status: DC
  Administered 2017-05-04 – 2017-05-05 (×2): 10 mg via ORAL
  Filled 2017-05-03 (×2): qty 1

## 2017-05-03 MED ORDER — PANTOPRAZOLE SODIUM 40 MG PO TBEC
40.0000 mg | DELAYED_RELEASE_TABLET | Freq: Every day | ORAL | Status: DC
Start: 2017-05-04 — End: 2017-05-05
  Administered 2017-05-04 – 2017-05-05 (×2): 40 mg via ORAL
  Filled 2017-05-03 (×2): qty 1

## 2017-05-03 MED ORDER — HYDROMORPHONE HCL 1 MG/ML IJ SOLN: 1.0000 mg | INTRAMUSCULAR | Status: DC | PRN

## 2017-05-03 MED ORDER — POLYETHYLENE GLYCOL 3350 17 G PO PACK: 17.0000 g | PACK | Freq: Every day | ORAL | Status: DC | PRN

## 2017-05-03 MED ORDER — HYDRALAZINE HCL 20 MG/ML IJ SOLN: 5.0000 mg | INTRAMUSCULAR | Status: DC | PRN

## 2017-05-03 MED ORDER — ACETAMINOPHEN 325 MG PO TABS
650.0000 mg | ORAL_TABLET | Freq: Once | ORAL | Status: AC
Start: 2017-05-03 — End: 2017-05-03
  Administered 2017-05-03: 650 mg via ORAL

## 2017-05-03 MED ORDER — EPINEPHRINE 0.3 MG/0.3ML IJ SOAJ: 0.3000 mg | INTRAMUSCULAR | Status: DC | PRN

## 2017-05-03 MED ORDER — LORAZEPAM 1 MG PO TABS: 0.5000 mg | ORAL_TABLET | Freq: Every day | ORAL | Status: DC | PRN

## 2017-05-03 MED ORDER — MORPHINE SULFATE 15 MG PO TABS
15.0000 mg | ORAL_TABLET | ORAL | Status: DC | PRN
Start: 2017-05-03 — End: 2017-05-05
  Administered 2017-05-05: 15 mg via ORAL
  Filled 2017-05-03: qty 1

## 2017-05-03 MED ORDER — ACETAMINOPHEN 325 MG PO TABS: 650.0000 mg | ORAL_TABLET | Freq: Four times a day (QID) | ORAL | Status: DC | PRN

## 2017-05-03 MED ORDER — PIPERACILLIN-TAZOBACTAM 3.375 G IVPB 30 MIN
3.3750 g | Freq: Once | INTRAVENOUS | Status: AC
  Administered 2017-05-03: 3.375 g via INTRAVENOUS
  Filled 2017-05-03: qty 50

## 2017-05-03 MED ORDER — ZOLPIDEM TARTRATE 5 MG PO TABS: 5.0000 mg | ORAL_TABLET | Freq: Every evening | ORAL | Status: DC | PRN

## 2017-05-03 MED ORDER — POTASSIUM CHLORIDE 20 MEQ/15ML (10%) PO SOLN
60.0000 meq | Freq: Once | ORAL | Status: AC
  Administered 2017-05-03: 60 meq via ORAL
  Filled 2017-05-03: qty 45

## 2017-05-03 MED ORDER — VANCOMYCIN HCL 10 G IV SOLR
2000.0000 mg | Freq: Once | INTRAVENOUS | Status: AC
  Administered 2017-05-03: 2000 mg via INTRAVENOUS
  Filled 2017-05-03: qty 2000

## 2017-05-03 MED ORDER — SODIUM CHLORIDE 0.9 % IV BOLUS (SEPSIS)
1000.0000 mL | Freq: Once | INTRAVENOUS | Status: AC
  Administered 2017-05-03: 1000 mL via INTRAVENOUS

## 2017-05-03 MED ORDER — ONDANSETRON HCL 4 MG PO TABS: 4.0000 mg | ORAL_TABLET | Freq: Four times a day (QID) | ORAL | Status: DC | PRN

## 2017-05-03 MED ORDER — ONDANSETRON HCL 4 MG/2ML IJ SOLN: 4.0000 mg | Freq: Four times a day (QID) | INTRAMUSCULAR | Status: DC | PRN

## 2017-05-03 MED ORDER — PIPERACILLIN-TAZOBACTAM 3.375 G IVPB
3.3750 g | Freq: Three times a day (TID) | INTRAVENOUS | Status: DC
  Administered 2017-05-04: 3.375 g via INTRAVENOUS
  Filled 2017-05-03 (×2): qty 50

## 2017-05-03 MED ORDER — SODIUM CHLORIDE 0.9 % IV BOLUS (SEPSIS)
2000.0000 mL | Freq: Once | INTRAVENOUS | Status: AC
  Administered 2017-05-03: 2000 mL via INTRAVENOUS

## 2017-05-03 NOTE — Progress Notes (Signed)
Pharmacy Antibiotic Note  Jesus Brown is a 52 y.o. male with recent R-TKA done on 1/4 admitted on 05/03/2017 with RLE erythema concerning for infection. Pharmacy has been consulted for Vancomycin + Zosyn dosing.  Plan: 1. Vancomycin 2000 mg IV x 1 to load followed by 750 mg IV every 12 hours 2. Zosyn 3.375g IV every 8 hours (infused over 4 hours) 3. Consider d/c Zosyn soon since gram negative pathogens are not a common cause of cellulitis 4. Will continue to follow renal function, culture results, LOT, and antibiotic de-escalation plans    Temp (24hrs), Avg:97.5 F (36.4 C), Min:97.5 F (36.4 C), Max:97.5 F (36.4 C)  Recent Labs  Lab 05/03/17 1714 05/03/17 1730 05/03/17 1833  WBC 19.3*  --   --   CREATININE 1.11  --   --   LATICACIDVEN  --  1.89 2.66*    Estimated Creatinine Clearance: 90.1 mL/min (by C-G formula based on SCr of 1.11 mg/dL).    Allergies  Allergen Reactions  . Adhesive [Tape] Other (See Comments)    Blisters, NO STERI STRIPS  paper tape is ok  Tegaderm - caused redness, itching, and burning sensation to the site, use gauze dressing over IV sites  . Baclofen Nausea Only and Other (See Comments)    Aches and pains   . Indocin [Indomethacin] Other (See Comments)    Burns stomach    Antimicrobials this admission: Vanc 1/26 >> Zosyn 1/26 >>  Dose adjustments this admission:   Microbiology results:   Thank you for allowing pharmacy to be a part of this patient's care.  Alycia Rossetti, PharmD, BCPS Clinical Pharmacist Pager: [phone] 05/03/2017 7:27 PM

## 2017-05-03 NOTE — ED Triage Notes (Signed)
Pt here after knee replacement on 1/4, now having increasing pain, redness, fever and chills, tachycardia.  Sent here by ortho.  Spoke to MD, asked for CRP and Sed Rate to be added in back.

## 2017-05-03 NOTE — ED Notes (Addendum)
MD requests phone call when pt arrives Dr. Louanne Skye [phone]

## 2017-05-03 NOTE — Consult Note (Signed)
Reason for Consult:Right leg pain, swelling, erythrema and warmth Referring Physician:MacKuen, MD Consulting Physician:Fain Louanne Skye, MD  Orthopedic Diagnosis:Right leg calf and pretibia cellulitis.  Chronic venous stasis right leg r/o DVT. 3 Weeks post right total knee replacement. Very good ROM, decreasing post operative swelling. No clinical sign of infection of the TKR. BTD:HRCBU Jesus Brown is an 52 y.o. male.History of bilateral knee osteoarthritis post right TKR 3 weeks ago. He has done well, was last seen in our office [place] 2 days ago with right  Plantar fascitis. TKR was stable and he is recovering well his ROM. Underwent right foot distal injection over the dorsal 3-4th Metatarsals for Morton's neuroma symptoms with good improvement in his foot Pain. History of previous right leg bouts of cellulitis, the last was  the first week of Dec. 2018 and his initial scheduled TLR was cancelled and rescheduled after treatment and no signs of infection. He did well and yesterday began noticing increasing pain in the right leg, mid Tibia distally and burning pain along the medial and lateral right calf and leg with fever and chills. He called and I advised him to be seen  At the [facility] ER. Presently his right Total Knee Replacement is  Relatively pain free, pain is mid and distal right calf and leg. 1/2" Increase right calf circumference compared with the left. Left leg  Post TKR 07/19/2016.   Past Medical History:  Diagnosis Date  . Anxiety    mainly r/t pain  . Arthritis    neck, knees  . Asthma    AS CHILD   . Back pain, chronic   . DDD (degenerative disc disease)   . Dysphagia   . 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 GERD (gastroesophageal reflux disease)   . Hypertension   . Hypotestosteronemia 09/08/2015  . Neck pain, chronic     Past Surgical History:  Procedure Laterality Date  . ANKLE SURGERY    . BACK SURGERY  2010,2011   lumb fusionx2  .  CERVICAL FUSION  [id number]   3 surgeries  . DIRECT LARYNGOSCOPY N/A 11/09/2012   Procedure: DIRECT LARYNGOSCOPY;  Surgeon: Jodi Marble, MD;  Location: [place];  Service: ENT;  Laterality: N/A;  . ESOPHAGOGASTRODUODENOSCOPY N/A 08/03/2012   Procedure: ESOPHAGOGASTRODUODENOSCOPY (EGD);  Surgeon: Arta Silence, MD;  Location: [facility] ENDOSCOPY;  Service: Endoscopy;  Laterality: N/A;  . ESOPHAGOSCOPY W/ BOTOX INJECTION N/A 11/09/2012   Procedure: ESOPHAGOSCOPY POSSIBLE BIOPSY AND POSSIBLE BOTOX INJECTION CRICOPHARYNGEUS;  Surgeon: Jodi Marble, MD;  Location: [place];  Service: ENT;  Laterality: N/A;  Esophagoscopy with botox injection  . HERNIA REPAIR     ing-rt  . KNEE SURGERY     rt and left x2 each=4  . POSTERIOR CERVICAL FUSION/FORAMINOTOMY N/A 03/25/2014   Procedure: LEFT C7-T1 FORAMINOTOMY;  Surgeon: Jessy Oto, MD;  Location: [place];  Service: Orthopedics;  Laterality: N/A;  . [place]   rt and lt  . TONSILLECTOMY    . TOTAL KNEE ARTHROPLASTY Left 07/19/2016   Procedure: LEFT TOTAL KNEE ARTHROPLASTY CORTISON INJECTION IN RIGHT KNEE;  Surgeon: Jessy Oto, MD;  Location: [place];  Service: Orthopedics;  Laterality: Left;  MAY NEED RNFA TO FINISH CASE PER [place]   . TOTAL KNEE ARTHROPLASTY Right 04/11/2017   Procedure: RIGHT TOTAL KNEE ARTHROPLASTY;  Surgeon: Jessy Oto, MD;  Location: [place];  Service: Orthopedics;  Laterality: Right;  . WRIST SURGERY  lt and rt    History reviewed. No pertinent family history.  Social History:  reports that  has never smoked. he has never used smokeless tobacco. He reports that he does not drink alcohol or use drugs.  Allergies:  Allergies  Allergen Reactions  . Adhesive [Tape] Other (See Comments)    Blisters, NO STERI STRIPS  paper tape is ok  Tegaderm - caused redness, itching, and burning sensation to the site, use gauze dressing over IV sites  . Baclofen Nausea Only and Other (See  Comments)    Aches and pains   . Indocin [Indomethacin] Other (See Comments)    Burns stomach    Medications: Prior to Admission:  (Not in a hospital admission) Scheduled: Continuous: PRN:  Results for orders placed or performed during the hospital encounter of 05/03/17 (from the past 48 hour(s))  CBC with Differential     Status: Abnormal   Collection Time: 05/03/17  5:14 PM  Result Value Ref Range   WBC 19.3 (H) 4.0 - 10.5 K/uL   RBC 5.28 4.22 - 5.81 MIL/uL   Hemoglobin 14.2 13.0 - 17.0 g/dL   HCT 43.3 39.0 - 52.0 %   MCV 82.0 78.0 - 100.0 fL   MCH 26.9 26.0 - 34.0 pg   MCHC 32.8 30.0 - 36.0 g/dL   RDW 14.7 11.5 - 15.5 %   Platelets 284 150 - 400 K/uL   Neutrophils Relative % 83 %   Neutro Abs 16.1 (H) 1.7 - 7.7 K/uL   Lymphocytes Relative 10 %   Lymphs Abs 1.9 0.7 - 4.0 K/uL   Monocytes Relative 7 %   Monocytes Absolute 1.3 (H) 0.1 - 1.0 K/uL   Eosinophils Relative 0 %   Eosinophils Absolute 0.1 0.0 - 0.7 K/uL   Basophils Relative 0 %   Basophils Absolute 0.0 0.0 - 0.1 K/uL  Comprehensive metabolic panel     Status: Abnormal   Collection Time: 05/03/17  5:14 PM  Result Value Ref Range   Sodium 134 (L) 135 - 145 mmol/L   Potassium 3.0 (L) 3.5 - 5.1 mmol/L   Chloride 94 (L) 101 - 111 mmol/L   CO2 28 22 - 32 mmol/L   Glucose, Bld 124 (H) 65 - 99 mg/dL   BUN 25 (H) 6 - 20 mg/dL   Creatinine, Ser 1.11 0.61 - 1.24 mg/dL   Calcium 9.0 8.9 - 10.3 mg/dL   Total Protein 6.9 6.5 - 8.1 g/dL   Albumin 3.5 3.5 - 5.0 g/dL   AST 24 15 - 41 U/L   ALT 21 17 - 63 U/L   Alkaline Phosphatase 100 38 - 126 U/L   Total Bilirubin 1.1 0.3 - 1.2 mg/dL   GFR calc non Af Amer >60 >60 mL/min   GFR calc Af Amer >60 >60 mL/min    Comment: (NOTE) The eGFR has been calculated using the CKD EPI equation. This calculation has not been validated in all clinical situations. eGFR's persistently <60 mL/min signify possible Chronic Kidney Disease.    Anion gap 12 5 - 15  I-Stat CG4 Lactic  Acid, ED     Status: None   Collection Time: 05/03/17  5:30 PM  Result Value Ref Range   Lactic Acid, Venous 1.89 0.5 - 1.9 mmol/L  I-Stat CG4 Lactic Acid, ED     Status: Abnormal   Collection Time: 05/03/17  6:33 PM  Result Value Ref Range   Lactic Acid, Venous 2.66 (HH) 0.5 - 1.9 mmol/L  Comment NOTIFIED PHYSICIAN     Dg Tibia/fibula Right  Result Date: 05/03/2017 CLINICAL DATA:  Cellulitis RIGHT lower leg, fever, chills, had RIGHT knee replacement 3 weeks ago EXAM: RIGHT TIBIA AND FIBULA - 2 VIEW COMPARISON:  07/28/2011 FINDINGS: New components of a RIGHT knee prosthesis since previous exam. Osseous mineralization normal. Knee and ankle joint alignments normal. Malleable plate and 4 screws at distal RIGHT fibula post ORIF. No acute fracture, dislocation, or bone destruction. No lucency seen surrounding orthopedic hardware. Scattered soft tissue edema RIGHT lower leg. IMPRESSION: Prior distal RIGHT fibular ORIF and RIGHT total knee arthroplasty. No acute bony abnormalities. Electronically Signed   By: Lavonia Dana M.D.   On: 05/03/2017 16:30   Dg Knee Complete 4 Views Right  Result Date: 05/03/2017 CLINICAL DATA:  Cellulitis RIGHT lower leg, fever, chills, had RIGHT knee replacement 3 weeks ago EXAM: RIGHT KNEE - COMPLETE 4+ VIEW COMPARISON:  07/28/2011 RIGHT tibial and fibular radiographs FINDINGS: Osseous mineralization normal. New components of RIGHT knee prosthesis in expected positions. No acute fracture, dislocation, or bone destruction. No knee joint effusion or periprosthetic lucency. IMPRESSION: RIGHT knee prosthesis without acute complication. Electronically Signed   By: Lavonia Dana M.D.   On: 05/03/2017 16:28    Pulse (!) 108, temperature (!) 97.5 F (36.4 C), temperature source Oral, resp. rate 18, SpO2 97 %.  Positive ROS: All other systems have been reviewed and were otherwise negative with the exception of those mentioned in the HPI and as above.  Physical Exam: General:  Alert, no acute distress Cardiovascular: No pedal edema Respiratory: No cyanosis, no use of accessory musculature GI: No organomegaly, abdomen is soft and non-tender Skin: No lesions in the area of chief complaint Neurologic: Sensation intact distally Psychiatric: Patient is competent for consent with normal mood and affect Lymphatic: No axillary or cervical lymphadenopathy  MUSCULOSKELETAL: Right leg with induration and warmth localized to the right mid leg and distal leg. He is painful to touch of the skin and medial and lateral calf, Hoffman's sign is negative. The circumference of the right calf is 1/2" greater than the left. The right knee is not warm, there is no knee effusion, incision is healed with No open wound. ROM 5-115 degrees. DP 2+ and PT 2+ Radiographs show no abnormality associated with a well seated right total knee arthroplasty Tibia fibula, retained plate and screw right distal fibula. Mild ST swelling of the leg no acute bone changes seen.   Assessment/Plan:Right leg calf and pretibia cellulitis.  Chronic venous stasis right leg r/o DVT. 3 Weeks post right total knee replacement. Very good ROM, decreasing post operative swelling. No clinical sign of infection of the TKR. Plan:He needs IV antibiotics. Due to recurrence cellulitis an ID consult may be helpful for long term management and treatment of What appears to be chronic recurring cellulitis of the right leg.  I will follow .    Jimmie Rueter 05/03/2017, 6:46 PM

## 2017-05-03 NOTE — ED Provider Notes (Signed)
[place] [facility] PROGRESSIVE CARE Provider Note   CSN: [id number] Arrival date & time: 05/03/17  1512     History   Chief Complaint Chief Complaint  Patient presents with  . Post-op Problem    HPI BODEY FRIZELL is a 52 y.o. male.  HPI   52 year old male presenting with erythema to the right lower extremity.  Patient had knee replacement on the fourth of this month.  Patient has had 3 episodes of cellular in the lower extremity.  1 was recently delayed his last planned near placement.  Patient had been healing well, when yesterday started having some pain.  Today took off the compression sock found to have redness.  Patient noted chills, fever and feeling of systemic sickness.  Past Medical History:  Diagnosis Date  . Anxiety    mainly r/t pain  . Arthritis    neck, knees  . Asthma    AS CHILD   . Back pain, chronic   . DDD (degenerative disc disease)   . Dysphagia   . 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 GERD (gastroesophageal reflux disease)   . Hypertension   . Hypotestosteronemia 09/08/2015  . Neck pain, chronic     Patient Active Problem List   Diagnosis Date Noted  . Sepsis ([place]) 05/03/2017  . Cellulitis of right leg 05/03/2017  . Hypokalemia 05/03/2017  . Hypertension 05/03/2017  . GERD (gastroesophageal reflux disease) 05/03/2017  . Anxiety 05/03/2017  . S/P total knee replacement using cement, right 04/11/2017  . Status post total left knee replacement 09/26/2016  . Presence of left artificial knee joint 07/25/2016  . Pain and swelling of left lower leg 07/25/2016  . Bilateral primary osteoarthritis of knee 07/19/2016    Class: Chronic  . Unilateral primary osteoarthritis, right knee 07/19/2016  . 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 Hypotestosteronemia 09/08/2015  . Cervical spondylosis without myelopathy 03/25/2014    Class: Chronic  . Left cervical radiculopathy 03/25/2014    Class: Chronic  . Cervical  spondylosis with radiculopathy 03/25/2014    Past Surgical History:  Procedure Laterality Date  . ANKLE SURGERY    . BACK SURGERY  2010,2011   lumb fusionx2  . CERVICAL FUSION  [id number]   3 surgeries  . DIRECT LARYNGOSCOPY N/A 11/09/2012   Procedure: DIRECT LARYNGOSCOPY;  Surgeon: Jodi Marble, MD;  Location: [place];  Service: ENT;  Laterality: N/A;  . ESOPHAGOGASTRODUODENOSCOPY N/A 08/03/2012   Procedure: ESOPHAGOGASTRODUODENOSCOPY (EGD);  Surgeon: Arta Silence, MD;  Location: [facility] ENDOSCOPY;  Service: Endoscopy;  Laterality: N/A;  . ESOPHAGOSCOPY W/ BOTOX INJECTION N/A 11/09/2012   Procedure: ESOPHAGOSCOPY POSSIBLE BIOPSY AND POSSIBLE BOTOX INJECTION CRICOPHARYNGEUS;  Surgeon: Jodi Marble, MD;  Location: [place];  Service: ENT;  Laterality: N/A;  Esophagoscopy with botox injection  . HERNIA REPAIR     ing-rt  . KNEE SURGERY     rt and left x2 each=4  . POSTERIOR CERVICAL FUSION/FORAMINOTOMY N/A 03/25/2014   Procedure: LEFT C7-T1 FORAMINOTOMY;  Surgeon: Jessy Oto, MD;  Location: [place];  Service: Orthopedics;  Laterality: N/A;  . [place]   rt and lt  . TONSILLECTOMY    . TOTAL KNEE ARTHROPLASTY Left 07/19/2016   Procedure: LEFT TOTAL KNEE ARTHROPLASTY CORTISON INJECTION IN RIGHT KNEE;  Surgeon: Jessy Oto, MD;  Location: [place];  Service: Orthopedics;  Laterality: Left;  MAY NEED RNFA TO FINISH CASE PER [place]   . TOTAL KNEE ARTHROPLASTY  Right 04/11/2017   Procedure: RIGHT TOTAL KNEE ARTHROPLASTY;  Surgeon: Jessy Oto, MD;  Location: [place];  Service: Orthopedics;  Laterality: Right;  . WRIST SURGERY     lt and rt       Home Medications    Prior to Admission medications   Medication Sig Start Date End Date Taking? Authorizing Provider  Aspirin-Acetaminophen-Caffeine (GOODY HEADACHE PO) Take 1 packet by mouth daily.    Yes [provider]  EPINEPHrine (EPIPEN 2-PAK) 0.3 mg/0.3 mL IJ SOAJ injection Inject 0.3 mg  into the muscle as needed (allergic reaction).   Yes [provider]  hydrochlorothiazide (HYDRODIURIL) 25 MG tablet Take 25 mg by mouth daily.  08/17/15  Yes [provider]  loratadine (CLARITIN) 10 MG tablet Take 10 mg by mouth daily.   Yes [provider]  LORazepam (ATIVAN) 0.5 MG tablet Take 0.5 mg by mouth daily as needed for anxiety.    Yes [provider]  morphine (AVINZA) 60 MG 24 hr capsule Take 60 mg by mouth daily.  04/06/17  Yes [provider]  morphine (MSIR) 15 MG tablet Take 1 tablet (15 mg total) by mouth every 4 (four) hours as needed (pai). Patient taking differently: Take 15 mg by mouth every 8 (eight) hours as needed (breakthrough pain).  03/25/14  Yes Lanae Crumbly, PA-C  omeprazole (PRILOSEC) 20 MG capsule Take 20 mg by mouth daily.    Yes [provider]  PRESCRIPTION MEDICATION Inject 2 Doses as directed once a week. Allergy shots   Yes [provider]  Testosterone (ANDROGEL) 20.25 MG/1.25GM (1.62%) GEL Place 2 Pump onto the skin daily.    Yes [provider]  valsartan (DIOVAN) 160 MG tablet Take 160 mg by mouth daily.  08/17/15  Yes [provider]    Family History Family History  Problem Relation Age of Onset  . Hypertension Mother   . Heart attack Father     Social History Social History   Tobacco Use  . Smoking status: Never Smoker  . Smokeless tobacco: Never Used  Substance Use Topics  . Alcohol use: No  . Drug use: No     Allergies   Adhesive [tape]; Baclofen; and Indocin [indomethacin]   Review of Systems Review of Systems  Constitutional: Positive for chills, fatigue and fever. Negative for activity change.  Respiratory: Negative for shortness of breath.   Cardiovascular: Negative for chest pain.  Gastrointestinal: Negative for abdominal pain.  Skin: Positive for rash.     Physical Exam Updated Vital Signs BP 112/64 (BP Location: Left Arm)   Pulse (!)  114   Temp 98 F (36.7 C) (Oral)   Resp 18   Ht 5\' 8"  (1.727 m)   Wt 102.2 kg (225 lb 5 oz)   SpO2 98%   BMI 34.26 kg/m   Physical Exam  Constitutional: He is oriented to person, place, and time. He appears well-nourished.  HENT:  Head: Normocephalic.  Eyes: Conjunctivae are normal. Right eye exhibits no discharge. Left eye exhibits no discharge.  Cardiovascular: Regular rhythm.  Tachycardia.  Pulmonary/Chest: Effort normal and breath sounds normal. No respiratory distress.  Musculoskeletal:  Erythema distal to the right knee.  No erythema overlying the knee.  Small amount of erythema overlying the knee.  But no pain with movement.  Does not appear to be infected at this time.  Swelling distally consistent with recent surgery.  Neurological: He is oriented to person, place, and time.  Skin:  Skin is warm and dry. He is not diaphoretic.  Psychiatric: He has a normal mood and affect. His behavior is normal.     ED Treatments / Results  Labs (all labs ordered are listed, but only abnormal results are displayed) Labs Reviewed  CBC WITH DIFFERENTIAL/PLATELET - Abnormal; Notable for the following components:      Result Value   WBC 19.3 (*)    Neutro Abs 16.1 (*)    Monocytes Absolute 1.3 (*)    All other components within normal limits  COMPREHENSIVE METABOLIC PANEL - Abnormal; Notable for the following components:   Sodium 134 (*)    Potassium 3.0 (*)    Chloride 94 (*)    Glucose, Bld 124 (*)    BUN 25 (*)    All other components within normal limits  C-REACTIVE PROTEIN - Abnormal; Notable for the following components:   CRP 19.7 (*)    All other components within normal limits  I-STAT CG4 LACTIC ACID, ED - Abnormal; Notable for the following components:   Lactic Acid, Venous 2.66 (*)    All other components within normal limits  CULTURE, BLOOD (ROUTINE X 2)  CULTURE, BLOOD (ROUTINE X 2)  SEDIMENTATION RATE  MAGNESIUM  LACTIC ACID, PLASMA  LACTIC ACID, PLASMA    PROCALCITONIN  URINALYSIS, ROUTINE W REFLEX MICROSCOPIC  HIV ANTIBODY (ROUTINE TESTING)  BASIC METABOLIC PANEL  CBC  I-STAT CG4 LACTIC ACID, ED    EKG  EKG Interpretation None       Radiology Dg Tibia/fibula Right  Result Date: 05/03/2017 CLINICAL DATA:  Cellulitis RIGHT lower leg, fever, chills, had RIGHT knee replacement 3 weeks ago EXAM: RIGHT TIBIA AND FIBULA - 2 VIEW COMPARISON:  07/28/2011 FINDINGS: New components of a RIGHT knee prosthesis since previous exam. Osseous mineralization normal. Knee and ankle joint alignments normal. Malleable plate and 4 screws at distal RIGHT fibula post ORIF. No acute fracture, dislocation, or bone destruction. No lucency seen surrounding orthopedic hardware. Scattered soft tissue edema RIGHT lower leg. IMPRESSION: Prior distal RIGHT fibular ORIF and RIGHT total knee arthroplasty. No acute bony abnormalities. Electronically Signed   By: Lavonia Dana M.D.   On: 05/03/2017 16:30   Dg Knee Complete 4 Views Right  Result Date: 05/03/2017 CLINICAL DATA:  Cellulitis RIGHT lower leg, fever, chills, had RIGHT knee replacement 3 weeks ago EXAM: RIGHT KNEE - COMPLETE 4+ VIEW COMPARISON:  07/28/2011 RIGHT tibial and fibular radiographs FINDINGS: Osseous mineralization normal. New components of RIGHT knee prosthesis in expected positions. No acute fracture, dislocation, or bone destruction. No knee joint effusion or periprosthetic lucency. IMPRESSION: RIGHT knee prosthesis without acute complication. Electronically Signed   By: Lavonia Dana M.D.   On: 05/03/2017 16:28    Procedures Procedures (including critical care time)  CRITICAL CARE Performed by: Gardiner Sleeper Total critical care time: 60 minutes Critical care time was exclusive of separately billable procedures and treating other patients. Critical care was necessary to treat or prevent imminent or life-threatening deterioration. Critical care was time spent personally by me on the following  activities: development of treatment plan with patient and/or surrogate as well as nursing, discussions with consultants, evaluation of patient's response to treatment, examination of patient, obtaining history from patient or surrogate, ordering and performing treatments and interventions, ordering and review of laboratory studies, ordering and review of radiographic studies, pulse oximetry and re-evaluation of patient's condition.   Medications Ordered in ED Medications  vancomycin (VANCOCIN) IVPB 750 mg/150 ml premix (not administered)  piperacillin-tazobactam (ZOSYN) IVPB 3.375 g (not administered)  acetaminophen (TYLENOL) tablet 650 mg (not administered)  loratadine (CLARITIN) tablet 10 mg (not administered)  LORazepam (ATIVAN) tablet 0.5 mg (not administered)  morphine (MS CONTIN) 12 hr tablet 30 mg (30 mg Oral Given 05/03/17 2206)  pantoprazole (PROTONIX) EC tablet 40 mg (not administered)  morphine (MSIR) tablet 15 mg (not administered)  Testosterone 20.25 MG/1.25GM (1.62%) GEL 2 Pump (2 Pump Transdermal Not Given 05/03/17 2304)  0.9 %  sodium chloride infusion (not administered)  enoxaparin (LOVENOX) injection 40 mg (40 mg Subcutaneous Given 05/03/17 2206)  ondansetron (ZOFRAN) tablet 4 mg (not administered)    Or  ondansetron (ZOFRAN) injection 4 mg (not administered)  polyethylene glycol (MIRALAX / GLYCOLAX) packet 17 g (not administered)  hydrALAZINE (APRESOLINE) injection 5 mg (not administered)  zolpidem (AMBIEN) tablet 5 mg (not administered)  HYDROmorphone (DILAUDID) injection 1 mg (not administered)  sodium chloride 0.9 % bolus 1,000 mL (0 mLs Intravenous Stopped 05/03/17 2020)  vancomycin (VANCOCIN) 2,000 mg in sodium chloride 0.9 % 500 mL IVPB (2,000 mg Intravenous Transfusing/Transfer 05/03/17 2212)  piperacillin-tazobactam (ZOSYN) IVPB 3.375 g (0 g Intravenous Stopped 05/03/17 2005)  acetaminophen (TYLENOL) tablet 650 mg (650 mg Oral Given 05/03/17 1938)  potassium chloride  20 MEQ/15ML (10%) solution 60 mEq (60 mEq Oral Given 05/03/17 2205)  sodium chloride 0.9 % bolus 2,000 mL (2,000 mLs Intravenous New Bag/Given 05/03/17 2219)     Initial Impression / Assessment and Plan / ED Course  I have reviewed the triage vital signs and the nursing notes.  Pertinent labs & imaging results that were available during my care of the patient were reviewed by me and considered in my medical decision making (see chart for details).     52 year old male presenting with erythema to the right lower extremity.  Patient had knee replacement on the fourth of this month.  Patient has had 3 episodes of cellular in the lower extremity.  1 was recently delayed his last planned near placement.  Patient had been healing well, when yesterday started having some pain.  Today took off the compression sock found to have redness.  Patient noted chills, fever and feeling of systemic sickness.   7:04 PM Patient seen by Dr. Louanne Skye.  Given proximity of the cellulitis to the knee, recommend admission for IV antibiotics.  Will treat for sepsis.   Patient has elevated lactic, abnormal vital signs.  Will admit for IV antibiotics and follow-up with orthopedics as an inpatient as well. DVT study ordered as well.   Final Clinical Impressions(s) / ED Diagnoses   Final diagnoses:  None    ED Discharge Orders    None       Charlii Yost, Fredia Sorrow, MD 05/04/17 0013

## 2017-05-03 NOTE — H&P (Signed)
History and Physical    Jesus Brown QQI:[id number] DOB: [birth year] DOA: 05/03/2017  Referring MD/NP/PA:   PCP: Merrilee Seashore, MD   Patient coming from:  The patient is coming from home.  At baseline, pt is independent for most of ADL.  Chief Complaint: right leg pain and fever  HPI: Jesus Brown is a 52 y.o. male with medical history significant of hypertension, hyperlipidemia, asthma, GERD, anxiety, hypoaldosteronemia, chronic back pain, recurrent leg cellulitis, s/p of R knee replacement on 04/11/17, who presents with right leg pain and fever.  Patient states that she underwent right knee replacement on 1/4. She has been doing well until yesterday when he started having fever and chills. He also has worsening pain in right lower leg. He noted that his right lower leg becomes erythematous and warm. Patient does not have chest pain, shortness breath, cough. No nausea, vomiting, diarrhea, abdominal pain, symptoms for UTI. No unilateral weakness.  ED Course: pt was found to have WBC 19.3, lactic acid 1.09-->2.66, potassium 3.0, creatinine 1.11, temperature 97.5, tachycardia, tachypnea, oxygen saturation 97% on room air. X-ray of right knee and right tibia/fibular showed RIGHT knee prosthesis and prior distal RIGHT fibular ORIF without acute complication. Pt is admitted to telemetry bed as inpatient. Orthopedic surgeon, Dr. Louanne Skye was consulted.  Review of Systems:   General: has fevers, chills, no body weight gain, has fatigue HEENT: no blurry vision, hearing changes or sore throat Respiratory: no dyspnea, coughing, wheezing CV: no chest pain, no palpitations GI: no nausea, vomiting, abdominal pain, diarrhea, constipation GU: no dysuria, burning on urination, increased urinary frequency, hematuria  Ext: has right leg pain, erythema, warmth. Neuro: no unilateral weakness, numbness, or tingling, no vision change or hearing loss Skin:  no skin tear. MSK: No muscle spasm, no deformity, no  limitation of range of movement in spin Heme: No easy bruising.  Travel history: No recent long distant travel.  Allergy:  Allergies  Allergen Reactions  . Adhesive [Tape] Other (See Comments)    Blisters, NO STERI STRIPS  paper tape is ok  Tegaderm - caused redness, itching, and burning sensation to the site, use gauze dressing over IV sites  . Baclofen Nausea Only and Other (See Comments)    Aches and pains   . Indocin [Indomethacin] Other (See Comments)    Burns stomach    Past Medical History:  Diagnosis Date  . Anxiety    mainly r/t pain  . Arthritis    neck, knees  . Asthma    AS CHILD   . Back pain, chronic   . DDD (degenerative disc disease)   . Dysphagia   . 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 GERD (gastroesophageal reflux disease)   . Hypertension   . Hypotestosteronemia 09/08/2015  . Neck pain, chronic     Past Surgical History:  Procedure Laterality Date  . ANKLE SURGERY    . BACK SURGERY  2010,2011   lumb fusionx2  . CERVICAL FUSION  9417,4081   3 surgeries  . DIRECT LARYNGOSCOPY N/A 11/09/2012   Procedure: DIRECT LARYNGOSCOPY;  Surgeon: Jodi Marble, MD;  Location: [place];  Service: ENT;  Laterality: N/A;  . ESOPHAGOGASTRODUODENOSCOPY N/A 08/03/2012   Procedure: ESOPHAGOGASTRODUODENOSCOPY (EGD);  Surgeon: Arta Silence, MD;  Location: [facility] ENDOSCOPY;  Service: Endoscopy;  Laterality: N/A;  . ESOPHAGOSCOPY W/ BOTOX INJECTION N/A 11/09/2012   Procedure: ESOPHAGOSCOPY POSSIBLE BIOPSY AND POSSIBLE BOTOX INJECTION CRICOPHARYNGEUS;  Surgeon: Jodi Marble, MD;  Location: [facility];  Service: ENT;  Laterality: N/A;  Esophagoscopy with botox injection  . HERNIA REPAIR     ing-rt  . KNEE SURGERY     rt and left x2 each=4  . POSTERIOR CERVICAL FUSION/FORAMINOTOMY N/A 03/25/2014   Procedure: LEFT C7-T1 FORAMINOTOMY;  Surgeon: Jessy Oto, MD;  Location: [place];  Service: Orthopedics;  Laterality: N/A;  . [place]   rt and lt  . TONSILLECTOMY    . TOTAL KNEE ARTHROPLASTY Left 07/19/2016   Procedure: LEFT TOTAL KNEE ARTHROPLASTY CORTISON INJECTION IN RIGHT KNEE;  Surgeon: Jessy Oto, MD;  Location: [place];  Service: Orthopedics;  Laterality: Left;  MAY NEED RNFA TO FINISH CASE PER [place]   . TOTAL KNEE ARTHROPLASTY Right 04/11/2017   Procedure: RIGHT TOTAL KNEE ARTHROPLASTY;  Surgeon: Jessy Oto, MD;  Location: [place];  Service: Orthopedics;  Laterality: Right;  . WRIST SURGERY     lt and rt    Social History:  reports that  has never smoked. he has never used smokeless tobacco. He reports that he does not drink alcohol or use drugs.  Family History:  Family History  Problem Relation Age of Onset  . Hypertension Mother   . Heart attack Father      Prior to Admission medications   Medication Sig Start Date End Date Taking? Authorizing Provider  Aspirin-Acetaminophen-Caffeine (GOODY HEADACHE PO) Take 1 packet by mouth daily.     [provider]  EPINEPHrine (EPIPEN 2-PAK) 0.3 mg/0.3 mL IJ SOAJ injection Inject 0.3 mg into the muscle as needed (allergic reaction).    [provider]  hydrochlorothiazide (HYDRODIURIL) 25 MG tablet Take 25 mg by mouth daily.  08/17/15   [provider]  loratadine (CLARITIN) 10 MG tablet Take 10 mg by mouth daily.    [provider]  LORazepam (ATIVAN) 0.5 MG tablet Take 0.5 mg by mouth daily as needed for anxiety.     [provider]  morphine (AVINZA) 60 MG 24 hr capsule Take by mouth daily. 04/06/17   [provider]  morphine (KADIAN) 60 MG 24 hr capsule Take 60 mg by mouth every morning. 05/19/16   [provider]  morphine (MSIR) 15 MG tablet Take 1 tablet (15 mg total) by mouth every 4 (four) hours as needed (pai). Patient taking differently: Take 15 mg by mouth 3 (three) times daily as needed for moderate pain.  03/25/14   Lanae Crumbly, PA-C  omeprazole (PRILOSEC) 20 MG capsule  Take 20 mg by mouth daily.     [provider]  PRESCRIPTION MEDICATION Inject 2 Doses as directed once a week. Allergy shots    [provider]  Testosterone (ANDROGEL) 20.25 MG/1.25GM (1.62%) GEL Place 2 Pump onto the skin daily.     [provider]  valsartan (DIOVAN) 160 MG tablet Take 160 mg by mouth daily.  08/17/15   [provider]    Physical Exam: Vitals:   05/03/17 1856 05/03/17 1901 05/03/17 1902 05/03/17 1938  BP: 119/78 119/73    Pulse: (!) 113 (!) 122 (!) 109   Resp:   (!) 21   Temp:    99 F (37.2 C)  TempSrc:      SpO2: 99% 97% 100%    General: Not in acute distress HEENT:       Eyes: PERRL, EOMI, no scleral icterus.       ENT: No discharge from the ears and nose, no pharynx  injection, no tonsillar enlargement.        Neck: No JVD, no bruit, no mass felt. Heme: No neck lymph node enlargement. Cardiac: S1/S2, RRR, No murmurs, No gallops or rubs. Respiratory: No rales, wheezing, rhonchi or rubs. GI: Soft, nondistended, nontender, no rebound pain, no organomegaly, BS present. GU: No hematuria Ext: 2+DP/PT pulse bilaterally. has right leg swelling, erythema, warmth and tenderness. Musculoskeletal: No joint deformities, No joint redness or warmth, no limitation of ROM in spin. Skin: No rashes.  Neuro: Alert, oriented X3, cranial nerves II-XII grossly intact, moves all extremities normally. Psych: Patient is not psychotic, no suicidal or hemocidal ideation.  Labs on Admission: I have personally reviewed following labs and imaging studies  CBC: Recent Labs  Lab 05/03/17 1714  WBC 19.3*  NEUTROABS 16.1*  HGB 14.2  HCT 43.3  MCV 82.0  PLT 408   Basic Metabolic Panel: Recent Labs  Lab 05/03/17 1714  NA 134*  K 3.0*  CL 94*  CO2 28  GLUCOSE 124*  BUN 25*  CREATININE 1.11  CALCIUM 9.0   GFR: Estimated Creatinine Clearance: 90.1 mL/min (by C-G formula based on SCr of 1.11 mg/dL). Liver Function Tests: Recent Labs    Lab 05/03/17 1714  AST 24  ALT 21  ALKPHOS 100  BILITOT 1.1  PROT 6.9  ALBUMIN 3.5   No results for input(s): LIPASE, AMYLASE in the last 168 hours. No results for input(s): AMMONIA in the last 168 hours. Coagulation Profile: No results for input(s): INR, PROTIME in the last 168 hours. Cardiac Enzymes: No results for input(s): CKTOTAL, CKMB, CKMBINDEX, TROPONINI in the last 168 hours. BNP (last 3 results) No results for input(s): PROBNP in the last 8760 hours. HbA1C: No results for input(s): HGBA1C in the last 72 hours. CBG: No results for input(s): GLUCAP in the last 168 hours. Lipid Profile: No results for input(s): CHOL, HDL, LDLCALC, TRIG, CHOLHDL, LDLDIRECT in the last 72 hours. Thyroid Function Tests: No results for input(s): TSH, T4TOTAL, FREET4, T3FREE, THYROIDAB in the last 72 hours. Anemia Panel: No results for input(s): VITAMINB12, FOLATE, FERRITIN, TIBC, IRON, RETICCTPCT in the last 72 hours. Urine analysis:    Component Value Date/Time   COLORURINE YELLOW 04/09/2017 [place] 04/09/2017 1521   LABSPEC 1.017 04/09/2017 1521   PHURINE 6.0 04/09/2017 1521   GLUCOSEU NEGATIVE 04/09/2017 1521   HGBUR NEGATIVE 04/09/2017 1521   BILIRUBINUR NEGATIVE 04/09/2017 1521   KETONESUR NEGATIVE 04/09/2017 1521   PROTEINUR NEGATIVE 04/09/2017 1521   UROBILINOGEN 1.0 07/03/2009 1113   NITRITE NEGATIVE 04/09/2017 1521   LEUKOCYTESUR NEGATIVE 04/09/2017 1521   Sepsis Labs: '@LABRCNTIP' (procalcitonin:4,lacticidven:4) )No results found for this or any previous visit (from the past 240 hour(s)).   Radiological Exams on Admission: Dg Tibia/fibula Right  Result Date: 05/03/2017 CLINICAL DATA:  Cellulitis RIGHT lower leg, fever, chills, had RIGHT knee replacement 3 weeks ago EXAM: RIGHT TIBIA AND FIBULA - 2 VIEW COMPARISON:  07/28/2011 FINDINGS: New components of a RIGHT knee prosthesis since previous exam. Osseous mineralization normal. Knee and ankle joint  alignments normal. Malleable plate and 4 screws at distal RIGHT fibula post ORIF. No acute fracture, dislocation, or bone destruction. No lucency seen surrounding orthopedic hardware. Scattered soft tissue edema RIGHT lower leg. IMPRESSION: Prior distal RIGHT fibular ORIF and RIGHT total knee arthroplasty. No acute bony abnormalities. Electronically Signed   By: Lavonia Dana M.D.   On: 05/03/2017 16:30   Dg Knee Complete 4 Views Right  Result Date: 05/03/2017 CLINICAL  DATA:  Cellulitis RIGHT lower leg, fever, chills, had RIGHT knee replacement 3 weeks ago EXAM: RIGHT KNEE - COMPLETE 4+ VIEW COMPARISON:  07/28/2011 RIGHT tibial and fibular radiographs FINDINGS: Osseous mineralization normal. New components of RIGHT knee prosthesis in expected positions. No acute fracture, dislocation, or bone destruction. No knee joint effusion or periprosthetic lucency. IMPRESSION: RIGHT knee prosthesis without acute complication. Electronically Signed   By: Lavonia Dana M.D.   On: 05/03/2017 16:28     EKG: Independently reviewed.  Sinus rhythm, QTC 408, lower voltage, poor R-wave progression    Assessment/Plan Principal Problem:   Cellulitis of right leg Active Problems:   Hypotestosteronemia   S/P total knee replacement using cement, right   Sepsis (HCC)   Hypokalemia   Hypertension   GERD (gastroesophageal reflux disease)   Anxiety   Cellulitis of right leg and sepsis: Patient meets criteria for sepsis with fever, tachycardia and tachypnea. Elevated lactic acid 1.09-->2.66. Currently hemodynamically stable. Orthopedic surgeon, Dr. Louanne Skye was consulted. Per Dr. Louanne Skye, no clinical sign of infection of the R TKR. He recommended IV antibiotics and LE doppler to r/o DVT. Per Dr. Kirstie Mirza, "ID consult may be helpful for long term management and treatment of what appears to be chronic recurring cellulitis of the right leg.   - will admit to tele bed as inpt - Empiric antimicrobial treatment with vancomycin and  Zosyn per pharmacy were started in ED, will continue now - PRN Zofran for nausea - continue long lasting narcortics, MS contin - prn oral morphine and prn Dilaudid IV - Blood cultures x 2  - ESR and CRP - will get Procalcitonin and trend lactic acid levels per sepsis protocol. - IVF: 3 L of NS bolus in ED, followed by 100 cc/n - f/u LE doppler to r/o DVT  Hypokalemia: K= 3.0 on admission. - Repleted - Check Mg level  HTN: -Hold Valsartan HCTZ due to sepsis and risk of developing hypotension -When necessary hydralazine  Hypotestosteronemia:  -continue home Testosterone  S/P total knee replacement using cement, right -No signs of infection  GERD: -Protonix  Anxiety: -continue home ativan  DVT ppx:    SQ Lovenox Code Status: Full code Family Communication: None at bed side.   Disposition Plan:  Anticipate discharge back to previous home environment Consults called:  Ortho, Dr. Basil Dess Admission status:  Inpatient/tele      Date of Service 05/03/2017    Ivor Costa [facility] Pager [phone]  If 7PM-7AM, please contact night-coverage [email or web address] Password [facility] 05/03/2017, 7:57 PM

## 2017-05-04 ENCOUNTER — Inpatient Hospital Stay (HOSPITAL_COMMUNITY): Payer: BLUE CROSS/BLUE SHIELD

## 2017-05-04 ENCOUNTER — Encounter (HOSPITAL_COMMUNITY): Payer: BLUE CROSS/BLUE SHIELD

## 2017-05-04 DIAGNOSIS — M7989 Other specified soft tissue disorders: Secondary | ICD-10-CM

## 2017-05-04 DIAGNOSIS — Z91048 Other nonmedicinal substance allergy status: Secondary | ICD-10-CM

## 2017-05-04 DIAGNOSIS — Z8781 Personal history of (healed) traumatic fracture: Secondary | ICD-10-CM

## 2017-05-04 DIAGNOSIS — Z96651 Presence of right artificial knee joint: Secondary | ICD-10-CM

## 2017-05-04 DIAGNOSIS — M79609 Pain in unspecified limb: Secondary | ICD-10-CM

## 2017-05-04 DIAGNOSIS — K219 Gastro-esophageal reflux disease without esophagitis: Secondary | ICD-10-CM

## 2017-05-04 DIAGNOSIS — L03115 Cellulitis of right lower limb: Principal | ICD-10-CM

## 2017-05-04 DIAGNOSIS — Z886 Allergy status to analgesic agent status: Secondary | ICD-10-CM

## 2017-05-04 DIAGNOSIS — Z888 Allergy status to other drugs, medicaments and biological substances status: Secondary | ICD-10-CM

## 2017-05-04 DIAGNOSIS — S82891A Other fracture of right lower leg, initial encounter for closed fracture: Secondary | ICD-10-CM

## 2017-05-04 DIAGNOSIS — I959 Hypotension, unspecified: Secondary | ICD-10-CM

## 2017-05-04 LAB — BASIC METABOLIC PANEL
ANION GAP: 8 (ref 5–15)
BUN: 18 mg/dL (ref 6–20)
CALCIUM: 8.1 mg/dL — AB (ref 8.9–10.3)
CO2: 26 mmol/L (ref 22–32)
Chloride: 104 mmol/L (ref 101–111)
Creatinine, Ser: 1.03 mg/dL (ref 0.61–1.24)
GFR calc non Af Amer: >60 mL/min (ref >60)
GLUCOSE: 104 mg/dL — AB (ref 65–99)
POTASSIUM: 3.5 mmol/L (ref 3.5–5.1)
Sodium: 138 mmol/L (ref 135–145)

## 2017-05-04 LAB — CBC
HCT: 36.2 % — ABNORMAL LOW (ref 39.0–52.0)
HEMOGLOBIN: 11.3 g/dL — AB (ref 13.0–17.0)
MCH: 26.1 pg (ref 26.0–34.0)
MCHC: 31.2 g/dL (ref 30.0–36.0)
MCV: 83.6 fL (ref 78.0–100.0)
Platelets: 258 10*3/uL (ref 150–400)
RBC: 4.33 MIL/uL (ref 4.22–5.81)
RDW: 15.2 % (ref 11.5–15.5)
WBC: 13.5 10*3/uL — AB (ref 4.0–10.5)

## 2017-05-04 LAB — HIV ANTIBODY (ROUTINE TESTING W REFLEX): HIV SCREEN 4TH GENERATION: NONREACTIVE

## 2017-05-04 MED ORDER — CEFAZOLIN SODIUM-DEXTROSE 2-4 GM/100ML-% IV SOLN
2.0000 g | Freq: Three times a day (TID) | INTRAVENOUS | Status: DC
  Administered 2017-05-04 – 2017-05-05 (×4): 2 g via INTRAVENOUS
  Filled 2017-05-04 (×6): qty 100

## 2017-05-04 NOTE — Progress Notes (Signed)
PROGRESS NOTE   Jesus Brown  WJX:[id number]    DOB: 1965/04/15    DOA: 05/03/2017  PCP: Merrilee Seashore, MD   I have briefly reviewed patients previous medical records in [facility].  Brief Narrative:  52 year old male with PMH of HTN, HLD, asthma, GERD, anxiety, hyperaldosterone, chronic back pain, recurrent right leg cellulitis (third episode in the last 5 years), s/p right TKR 04/11/17 presented with right leg swelling, redness, pain and fever.  Admitted for right leg cellulitis.  Orthopedic/Dr. Louanne Skye consulted and feels right TKR is okay.  ID consulted.   Assessment & Plan:   Principal Problem:   Cellulitis of right leg Active Problems:   Hypotestosteronemia   S/P total knee replacement using cement, right   Hypokalemia   Hypertension   GERD (gastroesophageal reflux disease)   Anxiety   1. Right leg cellulitis, recurrent: I do not believe that the patient met sepsis criteria on admission.  He did not have high fever here. He did have mild tachycardia.  Lactate 1.09 > 2.66.  Right knee and leg x-rays without acute findings.  Orthopedic/Dr. Louanne Skye input appreciated >no clinical sign of infection of the TKR.  Had been empirically started on IV vancomycin and Zosyn.  ID consulted due to recurrent cellulitis and have changed to IV cefazolin.  Will follow lower extremity Doppler to rule out DVT.  Pain management and elevation of limb.  Follow blood cultures. 2. Essential hypertension: Controlled.  Temporarily holding valsartan-HCTZ due to infection and risk for hypotension.  Consider resuming in a.m.  PRN IV hydralazine. 3. Hypokalemia: Replaced. 4. Anemia: May be dilutional.  Follow CBC in a.m. 5. Hypo-testosterone: Continue home replacement. 6. S/P right TKR 04/11/17: No signs of infection.  Orthopedic input appreciated. 7. GERD: PPI. 8. Anxiety: As needed Ativan.  DVT prophylaxis: Lovenox Code Status: Full Family Communication: None at bedside Disposition: DC home pending  medical improvement, possibly in 48 hours.   Consultants:  Infectious disease  Procedures:  None  Antimicrobials:  IV vancomycin and Zosyn-discontinued. IV Ancef 1/27 >.   Subjective: Pain and redness of right leg slightly better than on admission.  Still swollen.  Denies insect bites, trauma or cuts to the leg.  States that this is his third episode of cellulitis in that leg in the last 5 years.  ROS: No fever or chills.  Objective:  Vitals:   05/03/17 2000 05/03/17 2018 05/03/17 2048 05/04/17 0504  BP: 117/65  112/64 135/76  Pulse:   (!) 114 (!) 105  Resp:   18 20  Temp:   98 F (36.7 C) 98.2 F (36.8 C)  TempSrc:   Oral Oral  SpO2:  98% 98% 98%  Weight:   102.2 kg (225 lb 5 oz)   Height:   '5\' 8"'  (1.727 m)     Examination:  General exam: Pleasant young male, moderately built and overweight, seen ambulating without difficulty in the room. Respiratory system: Clear to auscultation. Respiratory effort normal. Cardiovascular system: S1 & S2 heard, RRR. No JVD, murmurs, rubs, gallops or clicks. No pedal edema.  Telemetry: Sinus rhythm-mild sinus tachycardia up to 1/10. Gastrointestinal system: Abdomen is nondistended, soft and nontender. No organomegaly or masses felt. Normal bowel sounds heard. Central nervous system: Alert and oriented. No focal neurological deficits. Extremities: Symmetric 5 x 5 power. Skin: Right leg with diffuse swelling from below the knee to the ankle, circumferential patchy erythema (mostly medially) and mild increased warmth, seems to be receding from pen marking drawn  on admission.  This does not directly involve the knee/TKR site without complications. Psychiatry: Judgement and insight appear normal. Mood & affect appropriate.     Data Reviewed: I have personally reviewed following labs and imaging studies  CBC: Recent Labs  Lab 05/03/17 1714 05/04/17 0207  WBC 19.3* 13.5*  NEUTROABS 16.1*  --   HGB 14.2 11.3*  HCT 43.3 36.2*  MCV  82.0 83.6  PLT 284 242   Basic Metabolic Panel: Recent Labs  Lab 05/03/17 1714 05/03/17 2005 05/04/17 0207  NA 134*  --  138  K 3.0*  --  3.5  CL 94*  --  104  CO2 28  --  26  GLUCOSE 124*  --  104*  BUN 25*  --  18  CREATININE 1.11  --  1.03  CALCIUM 9.0  --  8.1*  MG  --  1.9  --    Liver Function Tests: Recent Labs  Lab 05/03/17 1714  AST 24  ALT 21  ALKPHOS 100  BILITOT 1.1  PROT 6.9  ALBUMIN 3.5   No results found for this or any previous visit (from the past 240 hour(s)).       Radiology Studies: Dg Tibia/fibula Right  Result Date: 05/03/2017 CLINICAL DATA:  Cellulitis RIGHT lower leg, fever, chills, had RIGHT knee replacement 3 weeks ago EXAM: RIGHT TIBIA AND FIBULA - 2 VIEW COMPARISON:  07/28/2011 FINDINGS: New components of a RIGHT knee prosthesis since previous exam. Osseous mineralization normal. Knee and ankle joint alignments normal. Malleable plate and 4 screws at distal RIGHT fibula post ORIF. No acute fracture, dislocation, or bone destruction. No lucency seen surrounding orthopedic hardware. Scattered soft tissue edema RIGHT lower leg. IMPRESSION: Prior distal RIGHT fibular ORIF and RIGHT total knee arthroplasty. No acute bony abnormalities. Electronically Signed   By: Lavonia Dana M.D.   On: 05/03/2017 16:30   Dg Knee Complete 4 Views Right  Result Date: 05/03/2017 CLINICAL DATA:  Cellulitis RIGHT lower leg, fever, chills, had RIGHT knee replacement 3 weeks ago EXAM: RIGHT KNEE - COMPLETE 4+ VIEW COMPARISON:  07/28/2011 RIGHT tibial and fibular radiographs FINDINGS: Osseous mineralization normal. New components of RIGHT knee prosthesis in expected positions. No acute fracture, dislocation, or bone destruction. No knee joint effusion or periprosthetic lucency. IMPRESSION: RIGHT knee prosthesis without acute complication. Electronically Signed   By: Lavonia Dana M.D.   On: 05/03/2017 16:28        Scheduled Meds: . enoxaparin (LOVENOX) injection  40 mg  Subcutaneous Q24H  . loratadine  10 mg Oral Daily  . morphine  30 mg Oral Q12H  . pantoprazole  40 mg Oral Daily  . Testosterone  2 Pump Transdermal Daily   Continuous Infusions: . sodium chloride 100 mL/hr at 05/04/17 0023  .  ceFAZolin (ANCEF) IV       LOS: 1 day     Vernell Leep, MD, FACP, [facility]. [facility] Pager [phone]  If 7PM-7AM, please contact night-coverage [email or web address] Password [facility] 05/04/2017, 11:24 AM

## 2017-05-04 NOTE — Progress Notes (Signed)
Right lower extremity venous duplex completed. No evidence of DVT, superficial thrombosis, or Baker's cyst. [place] 05/04/2017, 4:42 PM

## 2017-05-04 NOTE — Progress Notes (Signed)
MD notified of previous history of MRSA on 04/11/17. I will look at our protocol for checking him for MRSA.

## 2017-05-04 NOTE — Progress Notes (Signed)
Patient ID: Jesus Brown, male   DOB: February 22, 1966, 52 y.o.   MRN: [id number] Right knee ROm 5-120 degrees, there is an area of mild erythrema about the lower knee incision prepatella bursa but it is not warm. The lower right calf and leg with warmth, less pain today. Antibiotics changed to ancef. Right leg N-V normal. Dx: recurrent right leg cellulitis.        Retained hard ware right lateral fibula         23 days post right total knee replacement Plan: I appreciate medicine's help with this condition. With  Total knee he is at risk of seeding with this or future cellulitis And developing a long term solution is best.  Will continue to follow with you.

## 2017-05-04 NOTE — Progress Notes (Signed)
Infection prevention did not get back with me. I asked the charge, per protocol patient needs to be on contact precautions.

## 2017-05-04 NOTE — Consult Note (Signed)
Date of Admission:  05/03/2017          Reason for Consult: Recurrent cellulitis   Referring Provider: Dr. Algis Liming   Assessment: 1. Recurrent NON purulent cellulitis in right leg (3rd episode) with hypotension SIRS 2. Recent TKR on 04/11/17 by Dr. Louanne Skye 3. Prior ankle fracture requiring hardware 4. GERD.   Plan: 1. Narrowed to cefazolin 2. Follow-up blood cultures 3. Elevate right leg 4. When he resolves this particular episode of cellulitis can consider the idea of a chronic suppressive therapy versus him having antibiotics on hand that he can initiate if he develops symptoms  Principal Problem:   Cellulitis of right leg Active Problems:   Hypotestosteronemia   S/P total knee replacement using cement, right   Hypokalemia   Hypertension   GERD (gastroesophageal reflux disease)   Anxiety   Scheduled Meds: . enoxaparin (LOVENOX) injection  40 mg Subcutaneous Q24H  . loratadine  10 mg Oral Daily  . morphine  30 mg Oral Q12H  . pantoprazole  40 mg Oral Daily  . Testosterone  2 Pump Transdermal Daily   Continuous Infusions: .  ceFAZolin (ANCEF) IV     PRN Meds:.acetaminophen, hydrALAZINE, HYDROmorphone (DILAUDID) injection, LORazepam, morphine, ondansetron **OR** ondansetron (ZOFRAN) IV, polyethylene glycol, zolpidem  HPI: Jesus Brown is a 52 y.o. male with history of ankle fracture sp hardware over a decade ago who has had now 3 episodes of cellulitis in his right LE.   He had one episode approximately 4 years ago and then another episode this December which caused cancellation of his planned total knee replacement by Dr. Louanne Skye.  He was treated with IV antibiotics at a primary care office followed by oral antibiotics but he does not know which ones.  Ultimately underwent successful total knee replacement by Dr. Louanne Skye on 4 January, thousand 19.  He had been feeling completely well until Friday night when he developed acute onset of fevers and chills and malaise.  His  leg at the time was not bothering him.  He had no pain he had noted no erythema though he was wearing a compression sock at the time.  He also attributed to his acute illness to a flulike infection due to the fact that his wife was recently recovering from an upper respiratory tract infection that included fevers.  On Saturday though he began to notice pain and redness in his leg and ultimately came to the emergency department last night.  He was febrile tachycardic and with a low blood pressure.  Blood cultures were taken and he was initiated on "sepsis" antibiotics in the form of vancomycin and Zosyn.  The cellulitis itself is clearly non-purulent so more likely due to a streptococcal species.  Had plain films of his knee and of his ankle performed which showed no evidence of infection of the hardware in the ankle or in the prosthetic joint in the knee.  He has also been seen by Dr. Louanne Skye who also finds no evidence of infection in the knee.  On my exam the knee is well-appearing with a well healing wound and no clear-cut effusion.  He does have erythema that starts in the ankle and rises up to near the area of the total knee arthroplasty incision.  His ankle itself is nontender.  Dr. Linus Salmons will see the patient tomorrow.   Review of Systems: Review of Systems  Constitutional: Positive for chills, diaphoresis, fever and malaise/fatigue. Negative for weight loss.  HENT: Negative for congestion,  ear pain, hearing loss and sore throat.   Eyes: Negative for blurred vision and double vision.  Respiratory: Negative for cough, sputum production, shortness of breath, wheezing and stridor.   Cardiovascular: Negative for chest pain, palpitations and leg swelling.  Gastrointestinal: Positive for nausea. Negative for abdominal pain, blood in stool, constipation, diarrhea, heartburn, melena and vomiting.  Genitourinary: Negative for dysuria, flank pain and frequency.  Musculoskeletal: Positive for  myalgias. Negative for joint pain.  Neurological: Positive for weakness. Negative for dizziness, sensory change, focal weakness, loss of consciousness and headaches.  Endo/Heme/Allergies: Does not bruise/bleed easily.  Psychiatric/Behavioral: Negative for depression, substance abuse and suicidal ideas. The patient does not have insomnia.     Past Medical History:  Diagnosis Date  . Anxiety    mainly r/t pain  . Arthritis    neck, knees  . Asthma    AS CHILD   . Back pain, chronic   . DDD (degenerative disc disease)   . Dysphagia   . 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 GERD (gastroesophageal reflux disease)   . Hypertension   . Hypotestosteronemia 09/08/2015  . Neck pain, chronic     Social History   Tobacco Use  . Smoking status: Never Smoker  . Smokeless tobacco: Never Used  Substance Use Topics  . Alcohol use: No  . Drug use: No    Family History  Problem Relation Age of Onset  . Hypertension Mother   . Heart attack Father    Allergies  Allergen Reactions  . Adhesive [Tape] Other (See Comments)    Blisters, NO STERI STRIPS  paper tape is ok  Tegaderm - caused redness, itching, and burning sensation to the site, use gauze dressing over IV sites  . Baclofen Nausea Only and Other (See Comments)    Aches and pains   . Indocin [Indomethacin] Other (See Comments)    Burns stomach    OBJECTIVE: Blood pressure 135/76, pulse (!) 105, temperature 98.2 F (36.8 C), temperature source Oral, resp. rate 20, height 5\' 8"  (1.727 m), weight 225 lb 5 oz (102.2 kg), SpO2 98 %.  Physical Exam  Constitutional: He is oriented to person, place, and time and well-developed, well-nourished, and in no distress. No distress.  HENT:  Head: Normocephalic and atraumatic.  Right Ear: External ear normal.  Left Ear: External ear normal.  Nose: Nose normal.  Mouth/Throat: Oropharynx is clear and moist. No oropharyngeal exudate.  Eyes: Conjunctivae and EOM are normal.  Pupils are equal, round, and reactive to light. No scleral icterus.  Neck: Normal range of motion. Neck supple.  Cardiovascular: Normal rate, regular rhythm and normal heart sounds. Exam reveals no gallop and no friction rub.  No murmur heard. Pulmonary/Chest: Effort normal and breath sounds normal. No respiratory distress. He has no wheezes. He has no rales.  Abdominal: Soft. Bowel sounds are normal. He exhibits no distension. There is no tenderness. There is no rebound.  Musculoskeletal: Normal range of motion. He exhibits no edema or tenderness.  Lymphadenopathy:    He has no cervical adenopathy.  Neurological: He is alert and oriented to person, place, and time. Gait normal. Coordination normal.  Skin: Skin is warm and dry. He is not diaphoretic. There is erythema.  Psychiatric: Mood, memory, affect and judgment normal.   Right LE cellulitis 05/04/17:      Lab Results Lab Results  Component Value Date   WBC 13.5 (H) 05/04/2017   HGB 11.3 (L) 05/04/2017   HCT 36.2 (L)  05/04/2017   MCV 83.6 05/04/2017   PLT 258 05/04/2017    Lab Results  Component Value Date   CREATININE 1.03 05/04/2017   BUN 18 05/04/2017   NA 138 05/04/2017   K 3.5 05/04/2017   CL 104 05/04/2017   CO2 26 05/04/2017    Lab Results  Component Value Date   ALT 21 05/03/2017   AST 24 05/03/2017   ALKPHOS 100 05/03/2017   BILITOT 1.1 05/03/2017     Microbiology: No results found for this or any previous visit (from the past 240 hour(s)).  Alcide Evener, [facility] [phone] pager   [phone] cell 05/04/2017, 11:52 AM

## 2017-05-04 NOTE — Progress Notes (Signed)
Infection prevention was paged waiting to tell them of the patient having MRSA on 04/11/17 and will be asked whether or not the patient needs to be on contact precautions

## 2017-05-04 NOTE — Progress Notes (Signed)
Patient arrived to the unit via bed from the ED. Patient is alert and oriented.  No complaints of pain./  Skin assessment complete.  Cellulitis to the right leg.  IV intact to the right antecubital.  Educated the patient on how to reach the staff on the unit.  Lowered the bed and placed the call light within reach.  Will continue to monitor the patient

## 2017-05-05 ENCOUNTER — Encounter (INDEPENDENT_AMBULATORY_CARE_PROVIDER_SITE_OTHER): Payer: Self-pay | Admitting: Surgery

## 2017-05-05 ENCOUNTER — Ambulatory Visit: Payer: BLUE CROSS/BLUE SHIELD | Admitting: Physical Therapy

## 2017-05-05 LAB — CBC
HCT: 38 % — ABNORMAL LOW (ref 39.0–52.0)
HEMOGLOBIN: 12.2 g/dL — AB (ref 13.0–17.0)
MCH: 26.6 pg (ref 26.0–34.0)
MCHC: 32.1 g/dL (ref 30.0–36.0)
MCV: 82.8 fL (ref 78.0–100.0)
Platelets: 235 10*3/uL (ref 150–400)
RBC: 4.59 MIL/uL (ref 4.22–5.81)
RDW: 14.7 % (ref 11.5–15.5)
WBC: 9.3 10*3/uL (ref 4.0–10.5)

## 2017-05-05 MED ORDER — BUPIVACAINE HCL 0.25 % IJ SOLN
0.3300 mL | INTRAMUSCULAR | Status: DC | PRN
  Administered 2017-05-01: .33 mL

## 2017-05-05 MED ORDER — METHYLPREDNISOLONE ACETATE 40 MG/ML IJ SUSP
40.0000 mg | INTRAMUSCULAR | Status: DC | PRN
  Administered 2017-05-01: 40 mg

## 2017-05-05 MED ORDER — CEPHALEXIN 500 MG PO CAPS: 500.0000 mg | ORAL_CAPSULE | Freq: Four times a day (QID) | ORAL | 0 refills | Status: DC

## 2017-05-05 MED ORDER — IRBESARTAN 300 MG PO TABS
150.0000 mg | ORAL_TABLET | Freq: Every day | ORAL | Status: DC
  Administered 2017-05-05: 150 mg via ORAL
  Filled 2017-05-05: qty 1

## 2017-05-05 MED ORDER — HYDROCHLOROTHIAZIDE 25 MG PO TABS
25.0000 mg | ORAL_TABLET | Freq: Every day | ORAL | Status: DC
  Administered 2017-05-05: 25 mg via ORAL
  Filled 2017-05-05: qty 1

## 2017-05-05 NOTE — Discharge Summary (Addendum)
Physician Discharge Summary  Jesus Brown XBM:[id number] DOB: 06-Mar-1966  PCP: Merrilee Seashore, MD  Admit date: 05/03/2017 Discharge date: 05/05/2017  Recommendations for Outpatient Follow-up:  1. Dr. Merrilee Seashore, PCP in 4 days with repeat labs (CBC, BMP, magnesium). 2. Dr. Basil Dess, Orthopedics: Patient to call for appointment.  Home Health: None Equipment/Devices: None  Discharge Condition: Improved and stable CODE STATUS: Full Diet recommendation: Heart healthy diet.  Discharge Diagnoses:  Principal Problem:   Cellulitis of right leg Active Problems:   Hypotestosteronemia   S/P total knee replacement using cement, right   Hypokalemia   Hypertension   GERD (gastroesophageal reflux disease)   Anxiety   Closed fracture of right ankle   Brief Summary: 52 year old male with PMH of HTN, HLD, asthma, GERD, anxiety, hyperaldosterone, chronic back pain, recurrent right leg cellulitis (third episode in the last 5 years), s/p right TKR 04/11/17 presented with right leg swelling, redness, pain and fever.  Admitted for right leg cellulitis.  Orthopedic/Dr. Louanne Skye consulted and feels right TKR is okay.  ID consulted.   Assessment & Plan:   1. Right leg cellulitis, recurrent:  do not believe that the patient met sepsis criteria on admission.  He did not have high fever here. He did have mild tachycardia.  Lactate 1.09 > 2.66.  Right knee and leg x-rays without acute findings.  Orthopedic/Dr. Louanne Skye input appreciated >no clinical sign of infection of the TKR.  Had been empirically started on IV vancomycin and Zosyn.  ID consulted due to recurrent cellulitis and changed to IV cefazolin.  Right lower extremity venous Doppler negative for DVT.  Pain management and elevation of limb.  Blood cultures x2: Negative to date. ID has seen patient today and cleared him for discharge home.    Discussed with Infectious Disease. There recommendations are as copied below (in italics). On demand  antibiotics due to recurrent cellulitis and presence of hardware in his right leg.  "  No longer having fevers and WBC returned to normal. Would recommend transitioning to oral Keflex 500 mg QID for 7 days. OK from ID standpoint for discharge.   We discussed cellulitis prevention - moisturizer to legs, compression therapy, etc. He does not smoke, he is non-diabetic. May have some damage to venous system after trauma from fracture making him more prone to recurrence.   After discussion with him and considering his history of recurrences would recommend on demand amoxicillin 500 mg TID x 5 days at "prodromal" symptoms (ie: tingling or redness at the RLE that he typically experiences).   If he finds he has more than 3-4 episodes per year of recurrence where he needs treatment would re-evaluate with him about chronic daily suppression.   Would continue decolonization of nasal MRSA with mupirocin x 5 days. "  PCP to provide prescription for on demand Amoxicillin during upcoming office visit.  2. Essential hypertension:  Continue Diovan and HCTZ.  Controlled. 3. Hypokalemia: Replaced.  We will follow BMP in a few days as outpatient. 4. Anemia: May be dilutional.  Stable.  Follow BMP in a few days as outpatient. 5. Hypo-testosterone: Continue home replacement. 6. S/P right TKR 04/11/17: No signs of infection.  Orthopedic input appreciated.  Follow with orthopedics as outpatient. 7. GERD: PPI. 8. Anxiety: As needed Ativan. 9. Chronic pain: Continue home regimen.  Advised to minimize/abstain from [place] due to GI risks.  He verbalized understanding.     Consultants:  Infectious disease Orthopedics  Procedures:  None   Discharge  Instructions  Discharge Instructions    Activity as tolerated - No restrictions   Complete by:  As directed    Call MD for:  extreme fatigue   Complete by:  As directed    Call MD for:  persistant dizziness or light-headedness   Complete by:  As  directed    Call MD for:  redness, tenderness, or signs of infection (pain, swelling, redness, odor or green/yellow discharge around incision site)   Complete by:  As directed    Call MD for:  severe uncontrolled pain   Complete by:  As directed    Call MD for:  temperature >100.4   Complete by:  As directed    Diet - low sodium heart healthy   Complete by:  As directed        Medication List    TAKE these medications   ANDROGEL 20.25 MG/1.25GM (1.62%) Gel Generic drug:  Testosterone Place 2 Pump onto the skin daily.   cephALEXin 500 MG capsule Commonly known as:  KEFLEX Take 1 capsule (500 mg total) by mouth 4 (four) times daily.   EPIPEN 2-PAK 0.3 mg/0.3 mL Soaj injection Generic drug:  EPINEPHrine Inject 0.3 mg into the muscle as needed (allergic reaction).   GOODY HEADACHE PO Take 1 packet by mouth daily.   hydrochlorothiazide 25 MG tablet Commonly known as:  HYDRODIURIL Take 25 mg by mouth daily.   loratadine 10 MG tablet Commonly known as:  CLARITIN Take 10 mg by mouth daily.   LORazepam 0.5 MG tablet Commonly known as:  ATIVAN Take 0.5 mg by mouth daily as needed for anxiety.   morphine 15 MG tablet Commonly known as:  MSIR Take 1 tablet (15 mg total) by mouth every 4 (four) hours as needed (pai). What changed:    when to take this  reasons to take this   morphine 60 MG 24 hr capsule Commonly known as:  AVINZA Take 60 mg by mouth daily.   omeprazole 20 MG capsule Commonly known as:  PRILOSEC Take 20 mg by mouth daily.   PRESCRIPTION MEDICATION Inject 2 Doses as directed once a week. Allergy shots   valsartan 160 MG tablet Commonly known as:  DIOVAN Take 160 mg by mouth daily.      Follow-up Information    Merrilee Seashore, MD. Schedule an appointment as soon as possible for a visit in 4 day(s).   Specialty:  Internal Medicine Why:  To be seen with repeat labs (CBC, BMP & magnesium). Contact information: [address] [place] [address] [phone]        Jessy Oto, MD. Schedule an appointment as soon as possible for a visit.   Specialty:  Orthopedic Surgery Contact information: [address] [place] [address] [phone]          Allergies  Allergen Reactions  . Adhesive [Tape] Other (See Comments)    Blisters, NO STERI STRIPS  paper tape is ok  Tegaderm - caused redness, itching, and burning sensation to the site, use gauze dressing over IV sites  . Baclofen Nausea Only and Other (See Comments)    Aches and pains   . Indocin [Indomethacin] Other (See Comments)    Burns stomach      Procedures/Studies: Dg Tibia/fibula Right  Result Date: 05/03/2017 CLINICAL DATA:  Cellulitis RIGHT lower leg, fever, chills, had RIGHT knee replacement 3 weeks ago EXAM: RIGHT TIBIA AND FIBULA - 2 VIEW COMPARISON:  07/28/2011 FINDINGS: New components  of a RIGHT knee prosthesis since previous exam. Osseous mineralization normal. Knee and ankle joint alignments normal. Malleable plate and 4 screws at distal RIGHT fibula post ORIF. No acute fracture, dislocation, or bone destruction. No lucency seen surrounding orthopedic hardware. Scattered soft tissue edema RIGHT lower leg. IMPRESSION: Prior distal RIGHT fibular ORIF and RIGHT total knee arthroplasty. No acute bony abnormalities. Electronically Signed   By: Lavonia Dana M.D.   On: 05/03/2017 16:30   Dg Knee Complete 4 Views Right  Result Date: 05/03/2017 CLINICAL DATA:  Cellulitis RIGHT lower leg, fever, chills, had RIGHT knee replacement 3 weeks ago EXAM: RIGHT KNEE - COMPLETE 4+ VIEW COMPARISON:  07/28/2011 RIGHT tibial and fibular radiographs FINDINGS: Osseous mineralization normal. New components of RIGHT knee prosthesis in expected positions. No acute fracture, dislocation, or bone destruction. No knee joint effusion or periprosthetic lucency. IMPRESSION: RIGHT knee prosthesis without acute complication. Electronically Signed   By:  Lavonia Dana M.D.   On: 05/03/2017 16:28   Xr Foot Complete Right  Result Date: 05/05/2017 X-ray right foot does not show an obvious stress fracture. No bony lesions.  Xr Knee 1-2 Views Right  Result Date: 05/05/2017 X-ray right knee shows good alignment and seating of the components. No complicating features.     Subjective: Continues to feel better with improvement in pain, redness and swelling of right leg  ROS: No fever or chills.  Discharge Exam:  Vitals:   05/04/17 0504 05/04/17 1410 05/04/17 2057 05/05/17 0531  BP: 135/76 119/69 (!) 142/76 137/84  Pulse: (!) 105 (!) 103 98 90  Resp: _0 Temp: 98.2 F (36.8 C) 97.8 F (36.6 C) 98.2 F (36.8 C) (!) 97.4 F (36.3 C)  TempSrc: Oral Oral    SpO2: 98% 98% 98% 100%  Weight:      Height:        General exam: Pleasant young male, moderately built and overweight, lying comfortably supine in bed. Respiratory system: Clear to auscultation. Respiratory effort normal.  Stable without change. Cardiovascular system: S1 & S2 heard, RRR. No JVD, murmurs, rubs, gallops or clicks. No pedal edema.  Stable without change. Gastrointestinal system: Abdomen is nondistended, soft and nontender. No organomegaly or masses felt. Normal bowel sounds heard.  Stable without change. Central nervous system: Alert and oriented. No focal neurological deficits. Extremities: Symmetric 5 x 5 power. Skin: Residual patchy mild erythema in the medial and ventral aspect of right lower leg.  No tenderness.  No increased warmth.  Cellulitis findings from the rest of the leg seem to have resolved. Psychiatry: Judgement and insight appear normal. Mood & affect appropriate.       The results of significant diagnostics from this hospitalization (including imaging, microbiology, ancillary and laboratory) are listed below for reference.     Microbiology: Recent Results (from the past 240 hour(s))  Blood Culture (routine x 2)     Status: None  (Preliminary result)   Collection Time: 05/03/17  6:57 PM  Result Value Ref Range Status   Specimen Description BLOOD RIGHT ANTECUBITAL  Final   Special Requests   Final    BOTTLES DRAWN AEROBIC AND ANAEROBIC Blood Culture adequate volume   Culture NO GROWTH < 24 HOURS  Final   Report Status PENDING  Incomplete  Blood Culture (routine x 2)     Status: None (Preliminary result)   Collection Time: 05/03/17  7:15 PM  Result Value Ref Range Status   Specimen Description BLOOD BLOOD RIGHT HAND  Final   Special Requests AEROBIC BOTTLE ONLY Blood Culture adequate volume  Final   Culture NO GROWTH < 24 HOURS  Final   Report Status PENDING  Incomplete     Labs: CBC: Recent Labs  Lab 05/03/17 1714 05/04/17 0207 05/05/17 0249  WBC 19.3* 13.5* 9.3  NEUTROABS 16.1*  --   --   HGB 14.2 11.3* 12.2*  HCT 43.3 36.2* 38.0*  MCV 82.0 83.6 82.8  PLT 284 258 216   Basic Metabolic Panel: Recent Labs  Lab 05/03/17 1714 05/03/17 2005 05/04/17 0207  NA 134*  --  138  K 3.0*  --  3.5  CL 94*  --  104  CO2 28  --  26  GLUCOSE 124*  --  104*  BUN 25*  --  18  CREATININE 1.11  --  1.03  CALCIUM 9.0  --  8.1*  MG  --  1.9  --    Liver Function Tests: Recent Labs  Lab 05/03/17 1714  AST 24  ALT 21  ALKPHOS 100  BILITOT 1.1  PROT 6.9  ALBUMIN 3.5       Time coordinating discharge: Less than 30 minutes  SIGNED:  Vernell Leep, MD, FACP, [facility]. [facility] Pager [phone]  If 7PM-7AM, please contact night-coverage [email or web address] Password [email or web address] 05/05/2017, 1:58 PM

## 2017-05-05 NOTE — Discharge Instructions (Signed)

## 2017-05-05 NOTE — Progress Notes (Signed)
Patient discharge teaching given, including activity, diet, follow-up appoints, and medications. Patient verbalized understanding of all discharge instructions. IV access was d/c'd. Vitals are stable. Skin is intact except as charted in most recent assessments. Pt to be escorted out by NT, to be driven home by family. 

## 2017-05-05 NOTE — Progress Notes (Deleted)
PROGRESS NOTE   Jesus Brown  MHD:[id number]    DOB: 10/02/1965    DOA: 05/03/2017  PCP: Merrilee Seashore, MD   I have briefly reviewed patients previous medical records in [facility].  Brief Narrative:  52 year old male with PMH of HTN, HLD, asthma, GERD, anxiety, hyperaldosterone, chronic back pain, recurrent right leg cellulitis (third episode in the last 5 years), s/p right TKR 04/11/17 presented with right leg swelling, redness, pain and fever.  Admitted for right leg cellulitis.  Orthopedic/Dr. Louanne Skye consulted and feels right TKR is okay.  ID consulted.  Improving.  Possible discharge home 1/29.   Assessment & Plan:   Principal Problem:   Cellulitis of right leg Active Problems:   Hypotestosteronemia   S/P total knee replacement using cement, right   Hypokalemia   Hypertension   GERD (gastroesophageal reflux disease)   Anxiety   Closed fracture of right ankle   1. Right leg cellulitis, recurrent: I do not believe that the patient met sepsis criteria on admission.  He did not have high fever here. He did have mild tachycardia.  Lactate 1.09 > 2.66.  Right knee and leg x-rays without acute findings.  Orthopedic/Dr. Louanne Skye input appreciated >no clinical sign of infection of the TKR.  Had been empirically started on IV vancomycin and Zosyn.  ID consulted due to recurrent cellulitis and have changed to IV cefazolin.  Right lower extremity venous Doppler negative for DVT.  Pain management and elevation of limb.  Blood cultures x2: Negative to date. As per ID, since patient has hardware from prior ankle fracture, may need to consider chronic suppressive therapy versus him having antibiotics on hand that he can initiate if he develops symptoms.  Improving.  Continue IV Ancef for additional 24 hours and then consider discharging on oral antibiotics based on ID recommendations. 2. Essential hypertension: Resume valsartan-HCTZ which was on hold.  Mildly uncontrolled at  times. 3. Hypokalemia: Replaced. 4. Anemia: May be dilutional.  Stable. 5. Hypo-testosterone: Continue home replacement. 6. S/P right TKR 04/11/17: No signs of infection.  Orthopedic input appreciated. 7. GERD: PPI. 8. Anxiety: As needed Ativan.  DVT prophylaxis: Lovenox Code Status: Full Family Communication: None at bedside Disposition: DC home pending medical improvement, possibly 05/06/17.   Consultants:  Infectious disease Orthopedics  Procedures:  None  Antimicrobials:  IV vancomycin and Zosyn-discontinued. IV Ancef 1/27 >.   Subjective: Continues to feel better with improvement in pain, redness and swelling of right leg  ROS: No fever or chills.  Objective:  Vitals:   05/04/17 0504 05/04/17 1410 05/04/17 2057 05/05/17 0531  BP: 135/76 119/69 (!) 142/76 137/84  Pulse: (!) 105 (!) 103 98 90  Resp: '20 20 17 17  ' Temp: 98.2 F (36.8 C) 97.8 F (36.6 C) 98.2 F (36.8 C) (!) 97.4 F (36.3 C)  TempSrc: Oral Oral    SpO2: 98% 98% 98% 100%  Weight:      Height:        Examination:  General exam: Pleasant young male, moderately built and overweight, lying comfortably supine in bed. Respiratory system: Clear to auscultation. Respiratory effort normal.  Stable without change. Cardiovascular system: S1 & S2 heard, RRR. No JVD, murmurs, rubs, gallops or clicks. No pedal edema.  Stable without change. Gastrointestinal system: Abdomen is nondistended, soft and nontender. No organomegaly or masses felt. Normal bowel sounds heard.  Stable without change. Central nervous system: Alert and oriented. No focal neurological deficits. Extremities: Symmetric 5 x 5 power. Skin: Residual  patchy mild erythema in the medial and ventral aspect of right lower leg.  No tenderness.  No increased warmth.  Cellulitis findings from the rest of the leg seem to have resolved. Psychiatry: Judgement and insight appear normal. Mood & affect appropriate.     Data Reviewed: I have personally  reviewed following labs and imaging studies  CBC: Recent Labs  Lab 05/03/17 1714 05/04/17 0207 05/05/17 0249  WBC 19.3* 13.5* 9.3  NEUTROABS 16.1*  --   --   HGB 14.2 11.3* 12.2*  HCT 43.3 36.2* 38.0*  MCV 82.0 83.6 82.8  PLT 284 258 735   Basic Metabolic Panel: Recent Labs  Lab 05/03/17 1714 05/03/17 2005 05/04/17 0207  NA 134*  --  138  K 3.0*  --  3.5  CL 94*  --  104  CO2 28  --  26  GLUCOSE 124*  --  104*  BUN 25*  --  18  CREATININE 1.11  --  1.03  CALCIUM 9.0  --  8.1*  MG  --  1.9  --    Liver Function Tests: Recent Labs  Lab 05/03/17 1714  AST 24  ALT 21  ALKPHOS 100  BILITOT 1.1  PROT 6.9  ALBUMIN 3.5   Recent Results (from the past 240 hour(s))  Blood Culture (routine x 2)     Status: None (Preliminary result)   Collection Time: 05/03/17  6:57 PM  Result Value Ref Range Status   Specimen Description BLOOD RIGHT ANTECUBITAL  Final   Special Requests   Final    BOTTLES DRAWN AEROBIC AND ANAEROBIC Blood Culture adequate volume   Culture NO GROWTH < 24 HOURS  Final   Report Status PENDING  Incomplete  Blood Culture (routine x 2)     Status: None (Preliminary result)   Collection Time: 05/03/17  7:15 PM  Result Value Ref Range Status   Specimen Description BLOOD BLOOD RIGHT HAND  Final   Special Requests AEROBIC BOTTLE ONLY Blood Culture adequate volume  Final   Culture NO GROWTH < 24 HOURS  Final   Report Status PENDING  Incomplete         Radiology Studies: Dg Tibia/fibula Right  Result Date: 05/03/2017 CLINICAL DATA:  Cellulitis RIGHT lower leg, fever, chills, had RIGHT knee replacement 3 weeks ago EXAM: RIGHT TIBIA AND FIBULA - 2 VIEW COMPARISON:  07/28/2011 FINDINGS: New components of a RIGHT knee prosthesis since previous exam. Osseous mineralization normal. Knee and ankle joint alignments normal. Malleable plate and 4 screws at distal RIGHT fibula post ORIF. No acute fracture, dislocation, or bone destruction. No lucency seen surrounding  orthopedic hardware. Scattered soft tissue edema RIGHT lower leg. IMPRESSION: Prior distal RIGHT fibular ORIF and RIGHT total knee arthroplasty. No acute bony abnormalities. Electronically Signed   By: Lavonia Dana M.D.   On: 05/03/2017 16:30   Dg Knee Complete 4 Views Right  Result Date: 05/03/2017 CLINICAL DATA:  Cellulitis RIGHT lower leg, fever, chills, had RIGHT knee replacement 3 weeks ago EXAM: RIGHT KNEE - COMPLETE 4+ VIEW COMPARISON:  07/28/2011 RIGHT tibial and fibular radiographs FINDINGS: Osseous mineralization normal. New components of RIGHT knee prosthesis in expected positions. No acute fracture, dislocation, or bone destruction. No knee joint effusion or periprosthetic lucency. IMPRESSION: RIGHT knee prosthesis without acute complication. Electronically Signed   By: Lavonia Dana M.D.   On: 05/03/2017 16:28        Scheduled Meds: . enoxaparin (LOVENOX) injection  40 mg Subcutaneous Q24H  .  loratadine  10 mg Oral Daily  . morphine  30 mg Oral Q12H  . pantoprazole  40 mg Oral Daily   Continuous Infusions: .  ceFAZolin (ANCEF) IV Stopped (05/05/17 0538)     LOS: 2 days     Vernell Leep, MD, FACP, [facility]. [facility] Pager [phone]  If 7PM-7AM, please contact night-coverage [email or web address] Password [email or web address] 05/05/2017, 12:12 PM

## 2017-05-05 NOTE — Progress Notes (Signed)
[facility] for Infectious Disease  Date of Admission:  05/03/2017     Total days of antibiotics 2  Day 1 Ancef   Patient ID: Jesus Brown is a 52 y.o. male with hx of ankle fracture with retained hardware. He has been admitted with his 3rd episode of recurrent cellulitis in his right LE (2 episodes over the last 2 months). He recently underwent successful total knee replacement January 4th, 2019.  Principal Problem:   Cellulitis of right leg Active Problems:   Hypotestosteronemia   S/P total knee replacement using cement, right   Hypokalemia   Hypertension   GERD (gastroesophageal reflux disease)   Anxiety   Closed fracture of right ankle   . enoxaparin (LOVENOX) injection  40 mg Subcutaneous Q24H  . loratadine  10 mg Oral Daily  . morphine  30 mg Oral Q12H  . pantoprazole  40 mg Oral Daily    SUBJECTIVE/INTERVAL HISTORY: Feeling "much better" and feels as if he is ready to go home. Ultrasound of knee negative for cyst or DVT.    Allergies  Allergen Reactions  . Adhesive [Tape] Other (See Comments)    Blisters, NO STERI STRIPS  paper tape is ok  Tegaderm - caused redness, itching, and burning sensation to the site, use gauze dressing over IV sites  . Baclofen Nausea Only and Other (See Comments)    Aches and pains   . Indocin [Indomethacin] Other (See Comments)    Burns stomach    OBJECTIVE: Vitals:   05/04/17 0504 05/04/17 1410 05/04/17 2057 05/05/17 0531  BP: 135/76 119/69 (!) 142/76 137/84  Pulse: (!) 105 (!) 103 98 90  Resp: 20 20 17 17   Temp: 98.2 F (36.8 C) 97.8 F (36.6 C) 98.2 F (36.8 C) (!) 97.4 F (36.3 C)  TempSrc: Oral Oral    SpO2: 98% 98% 98% 100%  Weight:      Height:       Body mass index is 34.26 kg/m.  Physical Exam  Constitutional: He is oriented to person, place, and time and well-developed, well-nourished, and in no distress.  HENT:  Mouth/Throat: Oropharynx is clear and moist.  Eyes: Pupils are equal, round,  and reactive to light.  Cardiovascular: Normal rate, regular rhythm and normal heart sounds.  Pulmonary/Chest: Effort normal and breath sounds normal. No respiratory distress. He has no wheezes.  Abdominal: Soft. Bowel sounds are normal. He exhibits no distension.  Musculoskeletal:  Right calf with mild peeling of skin. Incision to right knee well healed and w/o warmth.   Lymphadenopathy:    He has no cervical adenopathy.  Neurological: He is alert and oriented to person, place, and time.  Vitals reviewed.   Lab Results Lab Results  Component Value Date   WBC 9.3 05/05/2017   HGB 12.2 (L) 05/05/2017   HCT 38.0 (L) 05/05/2017   MCV 82.8 05/05/2017   PLT 235 05/05/2017    Lab Results  Component Value Date   CREATININE 1.03 05/04/2017   BUN 18 05/04/2017   NA 138 05/04/2017   K 3.5 05/04/2017   CL 104 05/04/2017   CO2 26 05/04/2017    Lab Results  Component Value Date   ALT 21 05/03/2017   AST 24 05/03/2017   ALKPHOS 100 05/03/2017   BILITOT 1.1 05/03/2017     Microbiology: Recent Results (from the past 240 hour(s))  Blood Culture (routine x 2)     Status: None (Preliminary result)  Collection Time: 05/03/17  6:57 PM  Result Value Ref Range Status   Specimen Description BLOOD RIGHT ANTECUBITAL  Final   Special Requests   Final    BOTTLES DRAWN AEROBIC AND ANAEROBIC Blood Culture adequate volume   Culture NO GROWTH < 24 HOURS  Final   Report Status PENDING  Incomplete  Blood Culture (routine x 2)     Status: None (Preliminary result)   Collection Time: 05/03/17  7:15 PM  Result Value Ref Range Status   Specimen Description BLOOD BLOOD RIGHT HAND  Final   Special Requests AEROBIC BOTTLE ONLY Blood Culture adequate volume  Final   Culture NO GROWTH < 24 HOURS  Final   Report Status PENDING  Incomplete           ASSESSMENT: Recurrent non-purulent cellulitis of RLE  Recent right TKA   PLAN: 1. No longer having fevers and WBC returned to normal. Would recommend  transitioning to oral Keflex 500 mg QID for 7 days. OK from ID standpoint for discharge.  2. We discussed cellulitis prevention - moisturizer to legs, compression therapy, etc. He does not smoke, he is non-diabetic. May have some damage to venous system after trauma from fracture making him more prone to recurrence.  3. After discussion with him and considering his history of recurrences would recommend on demand amoxicillin 500 mg TID x 5 days at "prodromal" symptoms (ie: tingling or redness at the RLE that he typically experiences).  4. If he finds he has more than 3-4 episodes per year of recurrence where he needs treatment would re-evaluate with him about chronic daily suppression.  5. Would continue decolonization of nasal MRSA with mupirocin x 5 days.   Available outpatient as needed should the above recommendations not be successful.    Janene Madeira, MSN, NP-C [facility] for Infectious [place] Cell: [phone] Pager: [phone]  05/05/2017  11:48 AM

## 2017-05-05 NOTE — Progress Notes (Signed)
Patient ID: Jesus Brown, male   DOB: 25-Oct-1965, 53 y.o.   MRN: [id number] No fever or chills, pain from yesterday is better today and he is moving and standing better on the right leg. The right calf and leg with some mild erythrema, improved compared with yesterday. There is edema in the right leg'.  Pulses are normal. ROM of the right knee 5-125 degrees, no effusion right knee. WBC 9.8K down from 26K CRP is normal  Sed rate is normal Improving cellulitis right leg. On Ancef Day#2.

## 2017-05-05 NOTE — Progress Notes (Signed)
Waiting on patient wife to transport for discharge

## 2017-05-06 LAB — HEPATITIS C ANTIBODY (REFLEX): HCV Ab: <0.1 s/co ratio (ref 0.0–0.9)

## 2017-05-06 LAB — HEPATITIS B SURFACE ANTIGEN: Hepatitis B Surface Ag: NEGATIVE

## 2017-05-06 LAB — HCV COMMENT:

## 2017-05-07 ENCOUNTER — Ambulatory Visit: Payer: BLUE CROSS/BLUE SHIELD | Admitting: Physical Therapy

## 2017-05-08 LAB — CULTURE, BLOOD (ROUTINE X 2)
CULTURE: NO GROWTH
Culture: NO GROWTH
SPECIAL REQUESTS: ADEQUATE
Special Requests: ADEQUATE

## 2017-05-12 ENCOUNTER — Ambulatory Visit: Payer: BLUE CROSS/BLUE SHIELD | Attending: Specialist | Admitting: Physical Therapy

## 2017-05-12 ENCOUNTER — Encounter: Payer: Self-pay | Admitting: Physical Therapy

## 2017-05-12 DIAGNOSIS — M25512 Pain in left shoulder: Secondary | ICD-10-CM | POA: Insufficient documentation

## 2017-05-12 DIAGNOSIS — R262 Difficulty in walking, not elsewhere classified: Secondary | ICD-10-CM | POA: Diagnosis not present

## 2017-05-12 DIAGNOSIS — M25662 Stiffness of left knee, not elsewhere classified: Secondary | ICD-10-CM | POA: Diagnosis present

## 2017-05-12 DIAGNOSIS — R600 Localized edema: Secondary | ICD-10-CM | POA: Diagnosis present

## 2017-05-12 DIAGNOSIS — M6281 Muscle weakness (generalized): Secondary | ICD-10-CM | POA: Diagnosis present

## 2017-05-12 DIAGNOSIS — M25562 Pain in left knee: Secondary | ICD-10-CM | POA: Insufficient documentation

## 2017-05-12 DIAGNOSIS — M25572 Pain in left ankle and joints of left foot: Secondary | ICD-10-CM | POA: Diagnosis present

## 2017-05-12 DIAGNOSIS — M25612 Stiffness of left shoulder, not elsewhere classified: Secondary | ICD-10-CM | POA: Insufficient documentation

## 2017-05-12 DIAGNOSIS — M25561 Pain in right knee: Secondary | ICD-10-CM | POA: Diagnosis present

## 2017-05-12 NOTE — Therapy (Signed)
[place], [place], [address] Phone: [phone]   Fax:  [phone]  Physical Therapy Treatment  Patient Details  Name: Jesus Brown MRN: [id number] Date of Birth: 1965/08/28 Referring Provider: Dr. Basil Dess   Encounter Date: 05/12/2017  PT End of Session - 05/12/17 1108    Visit Number  2    Number of Visits  24    Date for PT Re-Evaluation  06/23/17    PT Start Time  1017    PT Stop Time  1104    PT Time Calculation (min)  47 min    Activity Tolerance  Patient tolerated treatment well    Behavior During Therapy  [facility] for tasks assessed/performed       Past Medical History:  Diagnosis Date  . Anxiety    mainly r/t pain  . Arthritis    neck, knees  . Asthma    AS CHILD   . Back pain, chronic   . DDD (degenerative disc disease)   . Dysphagia   . 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 GERD (gastroesophageal reflux disease)   . Hypertension   . Hypotestosteronemia 09/08/2015  . Neck pain, chronic     Past Surgical History:  Procedure Laterality Date  . ANKLE SURGERY    . BACK SURGERY  2010,2011   lumb fusionx2  . CERVICAL FUSION  3818,2993   3 surgeries  . DIRECT LARYNGOSCOPY N/A 11/09/2012   Procedure: DIRECT LARYNGOSCOPY;  Surgeon: Jodi Marble, MD;  Location: [place];  Service: ENT;  Laterality: N/A;  . ESOPHAGOGASTRODUODENOSCOPY N/A 08/03/2012   Procedure: ESOPHAGOGASTRODUODENOSCOPY (EGD);  Surgeon: Arta Silence, MD;  Location[facility];  Service: Endoscopy;  Laterality: N/A;  . ESOPHAGOSCOPY W/ BOTOX INJECTION N/A 11/09/2012   Procedure: ESOPHAGOSCOPY POSSIBLE BIOPSY AND POSSIBLE BOTOX INJECTION CRICOPHARYNGEUS;  Surgeon: Jodi Marble, MD;  Location: [place];  Service: ENT;  Laterality: N/A;  Esophagoscopy with botox injection  . HERNIA REPAIR     ing-rt  . KNEE SURGERY     rt and left x2 each=4  . POSTERIOR CERVICAL  FUSION/FORAMINOTOMY N/A 03/25/2014   Procedure: LEFT C7-T1 FORAMINOTOMY;  Surgeon: Jessy Oto, MD;  Location: [place];  Service: Orthopedics;  Laterality: N/A;  . [place]   rt and lt  . TONSILLECTOMY    . TOTAL KNEE ARTHROPLASTY Left 07/19/2016   Procedure: LEFT TOTAL KNEE ARTHROPLASTY CORTISON INJECTION IN RIGHT KNEE;  Surgeon: Jessy Oto, MD;  Location: [place];  Service: Orthopedics;  Laterality: Left;  MAY NEED RNFA TO FINISH CASE PER [place]   . TOTAL KNEE ARTHROPLASTY Right 04/11/2017   Procedure: RIGHT TOTAL KNEE ARTHROPLASTY;  Surgeon: Jessy Oto, MD;  Location: [place];  Service: Orthopedics;  Laterality: Right;  . WRIST SURGERY     lt and rt    There were no vitals filed for this visit.  Subjective Assessment - 05/12/17 1022    Subjective  Was in the hospital last Sat to Tues.  for cellulitis.  MD is concerned that the Rt. ankle hardware in his body may be an issue.  Has to have MRI ankle in the coming weeks.  L plantar fasciitis now, has had it before.     Currently in Pain?  Yes    Pain Score  3     Pain Location  Knee    Pain Orientation  Right    Pain Descriptors /  Indicators  Aching    Pain Type  Surgical pain    Pain Onset  More than a month ago    Pain Frequency  Intermittent    Aggravating Factors   sitting or standing too long, overactivity     Pain Relieving Factors  RICE, compression stocking    Multiple Pain Sites  Yes    Pain Score  7    Pain Location  Heel    Pain Orientation  Left    Pain Descriptors / Indicators  Sharp    Pain Type  Acute pain    Pain Onset  Yesterday    Pain Frequency  Intermittent    Aggravating Factors   standing in AM    Pain Relieving Factors  sitting          [facility] PT Assessment - 05/12/17 0001      AROM   Right Knee Extension  22    Right Knee Flexion  106         [facility] Adult PT Treatment/Exercise - 05/12/17 0001      Knee/Hip Exercises: Stretches   Active Hamstring Stretch  3 reps    Gastroc  Stretch  Both;3 reps    Soleus Stretch  Both;1 rep    Other Knee/Hip Stretches  slantboard       Knee/Hip Exercises: Aerobic   Nustep  7 min UE and LE L4       Knee/Hip Exercises: Seated   Long Arc Quad  AROM;Right;1 set;20 reps      Knee/Hip Exercises: Supine   Short Arc Quad Sets  AROM;Right;1 set;20 reps      Manual Therapy   Manual Therapy  Soft tissue mobilization    Joint Mobilization  Gr I-II extension in supine    Soft tissue mobilization  Quads, ITB and lateral hamstring, IASTM tolerated well, patellar mobs with active extension             PT Education - 05/12/17 1108    Education provided  Yes    Education Details  continue stretching, extension >flex     Person(s) Educated  Patient    Methods  Explanation    Comprehension  Verbalized understanding       PT Short Term Goals - 05/12/17 1109      PT SHORT TERM GOAL #1   Title  Pt will be I with initial HEP for ROM and strength in L. knee     Status  On-going      PT SHORT TERM GOAL #2   Title  Pt will be able to go without compression stocking for up to 3 hours and manage edema     Status  On-going      PT SHORT TERM GOAL #3   Title  Pt will achieve no more than 10 deg knee extension for improved gait, flexibility.     Status  On-going        PT Long Term Goals - 04/28/17 1227      PT LONG TERM GOAL #1   Title  Pt will understand RICE, posture and body mechanics as it pertains to knee and back pain.     Time  8    Period  Weeks    Status  New    Target Date  06/23/17      PT LONG TERM GOAL #2   Title  Pt will be I with more advanced HEP for knee, core.     Time  8    Period  Weeks    Status  New    Target Date  06/23/17      PT LONG TERM GOAL #3   Title  Pt will less than 60% limited on FOTO to demo functional improvement    Baseline  85%    Period  Weeks    Status  New    Target Date  06/23/17      PT LONG TERM GOAL #4   Title  Pt will be able to go outside and participate in  family functions without limitations of knee pain, including walking on uneven ground.     Time  8    Period  Weeks    Status  New    Target Date  06/23/17      PT LONG TERM GOAL #5   Title  Pt will be able to walk for 30 min for general health and fitness without lasting knee pain     Time  8    Period  Weeks    Status  New    Target Date  06/23/17            Plan - 05/12/17 1109    Clinical Impression Statement  Patient developed cellulitis in Rt. LE and was in hospital for 4 days.  He was very stiff in Rt. extension today, lacking 22 deg actively.  Addressed with manual soft tissue work.  Tissue felt mildly tight in post knee, likely chronic hamstring tightness due to chronic low back pain. "Felt great" post session, declined modalities.     PT Next Visit Plan  ROM, manual, quads in standing     PT Home Exercise Plan  hamstring stretch, quad set, LAQ (has full HEP from previous episode but told to focus on knee ext ) , wall slide for quads     Consulted and Agree with Plan of Care  Patient       Patient will benefit from skilled therapeutic intervention in order to improve the following deficits and impairments:  Abnormal gait, Decreased mobility, Difficulty walking, Impaired sensation, Improper body mechanics, Decreased scar mobility, Decreased range of motion, Decreased activity tolerance, Decreased strength, Increased fascial restricitons, Impaired flexibility, Pain  Visit Diagnosis: Difficulty in walking, not elsewhere classified  Localized edema  Acute pain of right knee     Problem List Patient Active Problem List   Diagnosis Date Noted  . Closed fracture of right ankle   . Cellulitis of right leg 05/03/2017  . Hypokalemia 05/03/2017  . Hypertension 05/03/2017  . GERD (gastroesophageal reflux disease) 05/03/2017  . Anxiety 05/03/2017  . S/P total knee replacement using cement, right 04/11/2017  . Status post total left knee replacement 09/26/2016  .  Presence of left artificial knee joint 07/25/2016  . Pain and swelling of left lower leg 07/25/2016  . Bilateral primary osteoarthritis of knee 07/19/2016    Class: Chronic  . Unilateral primary osteoarthritis, right knee 07/19/2016  . 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 Hypotestosteronemia 09/08/2015  . Cervical spondylosis without myelopathy 03/25/2014    Class: Chronic  . Left cervical radiculopathy 03/25/2014    Class: Chronic  . Cervical spondylosis with radiculopathy 03/25/2014    Shiraz Bastyr 05/12/2017, 11:21 AM  [facility] [address] [place], [place], [address] Phone: [phone]   Fax:  [phone]  Name: ALANN AVEY MRN: [id number] Date of Birth: 1965/08/04  Raeford Razor, PT 05/12/17 11:21 AM Phone: [phone]  Fax: [phone]

## 2017-05-14 ENCOUNTER — Ambulatory Visit: Payer: BLUE CROSS/BLUE SHIELD | Admitting: Physical Therapy

## 2017-05-14 ENCOUNTER — Encounter: Payer: Self-pay | Admitting: Physical Therapy

## 2017-05-14 DIAGNOSIS — M25561 Pain in right knee: Secondary | ICD-10-CM

## 2017-05-14 DIAGNOSIS — R600 Localized edema: Secondary | ICD-10-CM

## 2017-05-14 DIAGNOSIS — R262 Difficulty in walking, not elsewhere classified: Secondary | ICD-10-CM

## 2017-05-14 NOTE — Therapy (Signed)
[place], [place], [address] Phone: [phone]   Fax:  [phone]  Physical Therapy Treatment  Patient Details  Name: Jesus Brown MRN: [id number] Date of Birth: 01-01-1966 Referring Provider: Dr. Basil Dess   Encounter Date: 05/14/2017  PT End of Session - 05/14/17 1242    Visit Number  3    Number of Visits  24    Date for PT Re-Evaluation  06/23/17    PT Start Time  1017    PT Stop Time  1100    PT Time Calculation (min)  43 min    Activity Tolerance  Patient tolerated treatment well    Behavior During Therapy  [facility] for tasks assessed/performed       Past Medical History:  Diagnosis Date  . Anxiety    mainly r/t pain  . Arthritis    neck, knees  . Asthma    AS CHILD   . Back pain, chronic   . DDD (degenerative disc disease)   . Dysphagia   . 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 GERD (gastroesophageal reflux disease)   . Hypertension   . Hypotestosteronemia 09/08/2015  . Neck pain, chronic     Past Surgical History:  Procedure Laterality Date  . ANKLE SURGERY    . BACK SURGERY  2010,2011   lumb fusionx2  . CERVICAL FUSION  2229,7989   3 surgeries  . DIRECT LARYNGOSCOPY N/A 11/09/2012   Procedure: DIRECT LARYNGOSCOPY;  Surgeon: Jodi Marble, MD;  Location: [facility];  Service: ENT;  Laterality: N/A;  . ESOPHAGOGASTRODUODENOSCOPY N/A 08/03/2012   Procedure: ESOPHAGOGASTRODUODENOSCOPY (EGD);  Surgeon: Arta Silence, MD;  Location: [facility] ENDOSCOPY;  Service: Endoscopy;  Laterality: N/A;  . ESOPHAGOSCOPY W/ BOTOX INJECTION N/A 11/09/2012   Procedure: ESOPHAGOSCOPY POSSIBLE BIOPSY AND POSSIBLE BOTOX INJECTION CRICOPHARYNGEUS;  Surgeon: Jodi Marble, MD;  Location: [facility];  Service: ENT;  Laterality: N/A;  Esophagoscopy with botox injection  . HERNIA REPAIR     ing-rt  . KNEE SURGERY     rt and left x2 each=4  . POSTERIOR CERVICAL  FUSION/FORAMINOTOMY N/A 03/25/2014   Procedure: LEFT C7-T1 FORAMINOTOMY;  Surgeon: Jessy Oto, MD;  Location: [facility];  Service: Orthopedics;  Laterality: N/A;  . [place]   rt and lt  . TONSILLECTOMY    . TOTAL KNEE ARTHROPLASTY Left 07/19/2016   Procedure: LEFT TOTAL KNEE ARTHROPLASTY CORTISON INJECTION IN RIGHT KNEE;  Surgeon: Jessy Oto, MD;  Location: [facility];  Service: Orthopedics;  Laterality: Left;  MAY NEED RNFA TO FINISH CASE PER [place]   . TOTAL KNEE ARTHROPLASTY Right 04/11/2017   Procedure: RIGHT TOTAL KNEE ARTHROPLASTY;  Surgeon: Jessy Oto, MD;  Location: [facility];  Service: Orthopedics;  Laterality: Right;  . WRIST SURGERY     lt and rt    There were no vitals filed for this visit.  Subjective Assessment - 05/14/17 1022    Subjective  Has been doing the stretches.  Knee aches.  I want to work on extension ,  My flexion is pretty good.  Uses cane due to feet.  Slept good last night.   Slept in a bed last night vs the couch.  Game ready makes my knee too cold.  I don't like it.    Currently in Pain?  Yes    Pain Score  5     Pain Location  Knee  Pain Orientation  Right    Pain Descriptors / Indicators  Aching    Pain Type  Surgical pain    Pain Frequency  Intermittent    Aggravating Factors   walking too lon  sitting too long    Pain Relieving Factors  rice    Pain Score  8    Pain Location  Heel    Pain Orientation  Right;Left;Lateral;Posterior    Pain Descriptors / Indicators  Aching;Sharp    Pain Type  Acute pain    Pain Frequency  Intermittent    Aggravating Factors   weightbearing    Pain Relieving Factors  sitting.           [facility] PT Assessment - 05/14/17 0001      AROM   Right Knee Extension  22    Right Knee Flexion  110      PROM   Overall PROM Comments  extension PROM 22                  [facility] Adult PT Treatment/Exercise - 05/14/17 0001      Ambulation/Gait   Gait Comments  Using cane due to foot pain vs knee       Knee/Hip Exercises: Stretches   Passive Hamstring Stretch  3 reps;30 seconds    Quad Stretch  3 reps;30 seconds      Knee/Hip Exercises: Aerobic   Nustep  6  minutes UE/LE,  L4      Knee/Hip Exercises: Standing   Terminal Knee Extension  10 reps;4 sets    Theraband Level (Terminal Knee Extension)  Level 3 (Green) 3 positions 10 x each standing    Terminal Knee Extension Limitations  also ball press into       Knee/Hip Exercises: Seated   Long Arc Quad  AROM;Right;1 set;20 reps      Knee/Hip Exercises: Supine   Quad Sets  -- multiple reps,  cued for technique    Straight Leg Raises  10 reps cued for technique    Patellar Mobs  yes      Knee/Hip Exercises: Prone   Hamstring Curl  10 reps    Hamstring Curl Limitations  with gentle ovr pressure for flexion,  pad at distal thigh for comfort      Modalities   Modalities  -- moist heat concurrent with patellar  "creeping"  mobs and HS      Manual Therapy   Manual Therapy  Soft tissue mobilization    Manual therapy comments  PROM  extension    Soft tissue mobilization  medial hamstrings,  patellar mobs,, able to increase motion of patella             PT Education - 05/14/17 1228    Education provided  Yes    Education Details  importance/role of patella    Person(s) Educated  Patient    Methods  Demonstration;Explanation    Comprehension  Verbalized understanding       PT Short Term Goals - 05/14/17 1249      PT SHORT TERM GOAL #1   Title  Pt will be I with initial HEP for ROM and strength in L. knee     Baseline  cues needed for technique,  minor    Time  4    Status  On-going      PT SHORT TERM GOAL #2   Title  Pt will be able to go without compression stocking for up to 3  hours and manage edema     Baseline  weaning from compression stocking    Time  4    Period  Weeks    Status  On-going      PT SHORT TERM GOAL #3   Title  Pt will achieve no more than 10 deg knee extension for improved gait,  flexibility.     Baseline  22    Time  4    Period  Weeks    Status  On-going        PT Long Term Goals - 04/28/17 1227      PT LONG TERM GOAL #1   Title  Pt will understand RICE, posture and body mechanics as it pertains to knee and back pain.     Time  8    Period  Weeks    Status  New    Target Date  06/23/17      PT LONG TERM GOAL #2   Title  Pt will be I with more advanced HEP for knee, core.     Time  8    Period  Weeks    Status  New    Target Date  06/23/17      PT LONG TERM GOAL #3   Title  Pt will less than 60% limited on FOTO to demo functional improvement    Baseline  85%    Period  Weeks    Status  New    Target Date  06/23/17      PT LONG TERM GOAL #4   Title  Pt will be able to go outside and participate in family functions without limitations of knee pain, including walking on uneven ground.     Time  8    Period  Weeks    Status  New    Target Date  06/23/17      PT LONG TERM GOAL #5   Title  Pt will be able to walk for 30 min for general health and fitness without lasting knee pain     Time  8    Period  Weeks    Status  New    Target Date  06/23/17            Plan - 05/14/17 1242    Clinical Impression Statement  Patient ROM 110 to 22 right knee,  flexion gradually increasing.  Marland Kitchen  He uses the cane due to pain in both of his feet. Pain in knee at end of session was 4/10.  he declined the need for modalities. Strenght and ROM focus today.   He has completed the antibiotics fro cellulitis.     PT Home Exercise Plan  hamstring stretch, quad set, LAQ (has full HEP from previous episode but told to focus on knee ext ) , wall slide for quads     Consulted and Agree with Plan of Care  Patient       Patient will benefit from skilled therapeutic intervention in order to improve the following deficits and impairments:     Visit Diagnosis: Difficulty in walking, not elsewhere classified  Localized edema  Acute pain of right  knee     Problem List Patient Active Problem List   Diagnosis Date Noted  . Closed fracture of right ankle   . Cellulitis of right leg 05/03/2017  . Hypokalemia 05/03/2017  . Hypertension 05/03/2017  . GERD (gastroesophageal reflux disease) 05/03/2017  . Anxiety 05/03/2017  . S/P total knee  replacement using cement, right 04/11/2017  . Status post total left knee replacement 09/26/2016  . Presence of left artificial knee joint 07/25/2016  . Pain and swelling of left lower leg 07/25/2016  . Bilateral primary osteoarthritis of knee 07/19/2016    Class: Chronic  . Unilateral primary osteoarthritis, right knee 07/19/2016  . 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 Hypotestosteronemia 09/08/2015  . Cervical spondylosis without myelopathy 03/25/2014    Class: Chronic  . Left cervical radiculopathy 03/25/2014    Class: Chronic  . Cervical spondylosis with radiculopathy 03/25/2014    Jesus Brown  PTA 05/14/2017, 12:51 PM  [facility] [address] [place], [place], [address] Phone: [phone]   Fax:  [phone]  Name: Jesus Brown MRN: [id number] Date of Birth: 09-07-1965

## 2017-05-15 ENCOUNTER — Telehealth (INDEPENDENT_AMBULATORY_CARE_PROVIDER_SITE_OTHER): Payer: Self-pay | Admitting: Surgery

## 2017-05-15 ENCOUNTER — Encounter (INDEPENDENT_AMBULATORY_CARE_PROVIDER_SITE_OTHER): Payer: Self-pay | Admitting: Surgery

## 2017-05-15 ENCOUNTER — Ambulatory Visit (INDEPENDENT_AMBULATORY_CARE_PROVIDER_SITE_OTHER): Payer: Medicare Other | Admitting: Surgery

## 2017-05-15 DIAGNOSIS — G5761 Lesion of plantar nerve, right lower limb: Secondary | ICD-10-CM

## 2017-05-15 DIAGNOSIS — Z96651 Presence of right artificial knee joint: Secondary | ICD-10-CM

## 2017-05-15 DIAGNOSIS — M7662 Achilles tendinitis, left leg: Secondary | ICD-10-CM

## 2017-05-15 NOTE — Telephone Encounter (Signed)
Can you ask [facility] about this patient this afternoon?

## 2017-05-15 NOTE — Progress Notes (Signed)
Office Visit Note   Patient: Jesus Brown           Date of Birth: 08/12/1965           MRN: [id number] Visit Date: 05/15/2017              Requested by: Merrilee Seashore, [place] [address] [place], [place] [address] PCP: Merrilee Seashore, MD   Assessment & Plan: Visit Diagnoses:  1. Morton's neuroma of right foot   2. S/P TKR (total knee replacement), right   3. Tendonitis, Achilles, left     Plan: Patient will continue PT protocol for right knee will also have therapist add PT for left Achilles tendinitis.  Follow-up in 4 weeks for recheck.  Follow-Up Instructions: Return in about 4 weeks (around 06/12/2017) for with Dr Louanne Skye.   Orders:  No orders of the defined types were placed in this encounter.  No orders of the defined types were placed in this encounter.     Procedures: No procedures performed   Clinical Data: No additional findings.   Subjective: Chief Complaint  Patient presents with  . Right Foot - Pain  . Left Foot - Pain    HPI Patient returns.  Right foot Morton's neuroma injection gave complete relief.  He is very pleased.  Complaining now of left anterior heel pain.  This has been ongoing the last few days.  No issues before onset.  Since patient was last seen by me in the clinic hospital admission for right lower extremity cellulitis.  Was on IV antibiotics times 3 days.  He is scheduled to follow-up with ID.  States that his leg is doing much better. Review of Systems No cardiac pulmonary GI GU issues  Objective: Vital Signs: There were no vitals taken for this visit.  Physical Exam  Constitutional: He is oriented to person, place, and time. He appears well-developed. No distress.  HENT:  Head: Normocephalic and atraumatic.  Eyes: Pupils are equal, round, and reactive to light.  Pulmonary/Chest: No respiratory distress.  Abdominal: He exhibits no distension.  Musculoskeletal:  No signs of cellulitis right lower  extremity..  Right knee range of Motion about 0-100 degrees.  Left foot moderate to markedly tender at the Achilles tendon calcaneal insertion.  No tendon defect. plantar fascia nontender.  Neurological: He is alert and oriented to person, place, and time.  Skin: Skin is warm and dry.  Psychiatric: He has a normal mood and affect.    Ortho Exam  Specialty Comments:  No specialty comments available.  Imaging: No results found.   PMFS History: Patient Active Problem List   Diagnosis Date Noted  . Closed fracture of right ankle   . Cellulitis of right leg 05/03/2017  . Hypokalemia 05/03/2017  . Hypertension 05/03/2017  . GERD (gastroesophageal reflux disease) 05/03/2017  . Anxiety 05/03/2017  . S/P total knee replacement using cement, right 04/11/2017  . Status post total left knee replacement 09/26/2016  . Presence of left artificial knee joint 07/25/2016  . Pain and swelling of left lower leg 07/25/2016  . Bilateral primary osteoarthritis of knee 07/19/2016    Class: Chronic  . Unilateral primary osteoarthritis, right knee 07/19/2016  . 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 Hypotestosteronemia 09/08/2015  . Cervical spondylosis without myelopathy 03/25/2014    Class: Chronic  . Left cervical radiculopathy 03/25/2014    Class: Chronic  . Cervical spondylosis with radiculopathy 03/25/2014   Past Medical History:  Diagnosis Date  .  Anxiety    mainly r/t pain  . Arthritis    neck, knees  . Asthma    AS CHILD   . Back pain, chronic   . DDD (degenerative disc disease)   . Dysphagia   . 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 GERD (gastroesophageal reflux disease)   . Hypertension   . Hypotestosteronemia 09/08/2015  . Neck pain, chronic     Family History  Problem Relation Age of Onset  . Hypertension Mother   . Heart attack Father     Past Surgical History:  Procedure Laterality Date  . ANKLE SURGERY    . BACK SURGERY  2010,2011     lumb fusionx2  . CERVICAL FUSION  1031,5945   3 surgeries  . DIRECT LARYNGOSCOPY N/A 11/09/2012   Procedure: DIRECT LARYNGOSCOPY;  Surgeon: Jodi Marble, MD;  Location: [facility];  Service: ENT;  Laterality: N/A;  . ESOPHAGOGASTRODUODENOSCOPY N/A 08/03/2012   Procedure: ESOPHAGOGASTRODUODENOSCOPY (EGD);  Surgeon: Arta Silence, MD;  Location: [facility] ENDOSCOPY;  Service: Endoscopy;  Laterality: N/A;  . ESOPHAGOSCOPY W/ BOTOX INJECTION N/A 11/09/2012   Procedure: ESOPHAGOSCOPY POSSIBLE BIOPSY AND POSSIBLE BOTOX INJECTION CRICOPHARYNGEUS;  Surgeon: Jodi Marble, MD;  Location: [facility];  Service: ENT;  Laterality: N/A;  Esophagoscopy with botox injection  . HERNIA REPAIR     ing-rt  . KNEE SURGERY     rt and left x2 each=4  . POSTERIOR CERVICAL FUSION/FORAMINOTOMY N/A 03/25/2014   Procedure: LEFT C7-T1 FORAMINOTOMY;  Surgeon: Jessy Oto, MD;  Location: [facility];  Service: Orthopedics;  Laterality: N/A;  . [place]   rt and lt  . TONSILLECTOMY    . TOTAL KNEE ARTHROPLASTY Left 07/19/2016   Procedure: LEFT TOTAL KNEE ARTHROPLASTY CORTISON INJECTION IN RIGHT KNEE;  Surgeon: Jessy Oto, MD;  Location: [facility];  Service: Orthopedics;  Laterality: Left;  MAY NEED RNFA TO FINISH CASE PER [place]   . TOTAL KNEE ARTHROPLASTY Right 04/11/2017   Procedure: RIGHT TOTAL KNEE ARTHROPLASTY;  Surgeon: Jessy Oto, MD;  Location: [facility];  Service: Orthopedics;  Laterality: Right;  . WRIST SURGERY     lt and rt   Social History   Occupational History  . Not on file  Tobacco Use  . Smoking status: Never Smoker  . Smokeless tobacco: Never Used  Substance and Sexual Activity  . Alcohol use: No  . Drug use: No  . Sexual activity: Not on file

## 2017-05-15 NOTE — Telephone Encounter (Signed)
Patient is confused about the Rx that Eliodoro gave him. Please advise # [phone]

## 2017-05-16 ENCOUNTER — Encounter: Payer: Self-pay | Admitting: Physical Therapy

## 2017-05-16 ENCOUNTER — Ambulatory Visit: Payer: BLUE CROSS/BLUE SHIELD | Admitting: Physical Therapy

## 2017-05-16 DIAGNOSIS — R262 Difficulty in walking, not elsewhere classified: Secondary | ICD-10-CM | POA: Diagnosis not present

## 2017-05-16 DIAGNOSIS — M25572 Pain in left ankle and joints of left foot: Secondary | ICD-10-CM

## 2017-05-16 DIAGNOSIS — M25561 Pain in right knee: Secondary | ICD-10-CM

## 2017-05-16 DIAGNOSIS — R600 Localized edema: Secondary | ICD-10-CM

## 2017-05-16 NOTE — Therapy (Signed)
[place], [place], [address] Phone: [phone]   Fax:  [phone]  Physical Therapy Treatment  Patient Details  Name: Jesus Brown MRN: [id number] Date of Birth: April 15, 1965 Referring Provider: Dr. Basil Dess   Encounter Date: 05/16/2017  PT End of Session - 05/16/17 1121    Visit Number  4    Number of Visits  24    Date for PT Re-Evaluation  06/23/17    PT Start Time  4315    PT Stop Time  1100    PT Time Calculation (min)  45 min    Activity Tolerance  Patient tolerated treatment well    Behavior During Therapy  [facility] for tasks assessed/performed       Past Medical History:  Diagnosis Date  . Anxiety    mainly r/t pain  . Arthritis    neck, knees  . Asthma    AS CHILD   . Back pain, chronic   . DDD (degenerative disc disease)   . Dysphagia   . 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 GERD (gastroesophageal reflux disease)   . Hypertension   . Hypotestosteronemia 09/08/2015  . Neck pain, chronic     Past Surgical History:  Procedure Laterality Date  . ANKLE SURGERY    . BACK SURGERY  2010,2011   lumb fusionx2  . CERVICAL FUSION  4008,6761   3 surgeries  . DIRECT LARYNGOSCOPY N/A 11/09/2012   Procedure: DIRECT LARYNGOSCOPY;  Surgeon: Jodi Marble, MD;  Location: [facility];  Service: ENT;  Laterality: N/A;  . ESOPHAGOGASTRODUODENOSCOPY N/A 08/03/2012   Procedure: ESOPHAGOGASTRODUODENOSCOPY (EGD);  Surgeon: Arta Silence, MD;  Location: [facility] ENDOSCOPY;  Service: Endoscopy;  Laterality: N/A;  . ESOPHAGOSCOPY W/ BOTOX INJECTION N/A 11/09/2012   Procedure: ESOPHAGOSCOPY POSSIBLE BIOPSY AND POSSIBLE BOTOX INJECTION CRICOPHARYNGEUS;  Surgeon: Jodi Marble, MD;  Location: [facility];  Service: ENT;  Laterality: N/A;  Esophagoscopy with botox injection  . HERNIA REPAIR     ing-rt  . KNEE SURGERY     rt and left x2 each=4  . POSTERIOR CERVICAL  FUSION/FORAMINOTOMY N/A 03/25/2014   Procedure: LEFT C7-T1 FORAMINOTOMY;  Surgeon: Jessy Oto, MD;  Location: [facility];  Service: Orthopedics;  Laterality: N/A;  . [place]   rt and lt  . TONSILLECTOMY    . TOTAL KNEE ARTHROPLASTY Left 07/19/2016   Procedure: LEFT TOTAL KNEE ARTHROPLASTY CORTISON INJECTION IN RIGHT KNEE;  Surgeon: Jessy Oto, MD;  Location: [facility];  Service: Orthopedics;  Laterality: Left;  MAY NEED RNFA TO FINISH CASE PER [place]   . TOTAL KNEE ARTHROPLASTY Right 04/11/2017   Procedure: RIGHT TOTAL KNEE ARTHROPLASTY;  Surgeon: Jessy Oto, MD;  Location: [facility];  Service: Orthopedics;  Laterality: Right;  . WRIST SURGERY     lt and rt    There were no vitals filed for this visit.  Subjective Assessment - 05/16/17 1019    Subjective  I have a new Rx for L Achilles tendonitis.  I've been stretching it this AM.      Currently in Pain?  Yes    Pain Score  6  when knee extension     Pain Location  Knee    Pain Orientation  Right    Pain Descriptors / Indicators  Aching    Pain Type  Surgical pain    Pain Onset  More than a month ago  Pain Frequency  Intermittent    Pain Score  8    Pain Location  Heel    Pain Orientation  Left    Pain Descriptors / Indicators  Sharp    Pain Type  Acute pain    Pain Onset  1 to 4 weeks ago    Pain Frequency  Intermittent    Aggravating Factors   weightbearing AM     Pain Relieving Factors  sitting, taking pressure off, ice, stretching          [facility] PT Assessment - 05/16/17 0001      AROM   Right Ankle Dorsiflexion  10    Right Ankle Plantar Flexion  30    Left Ankle Dorsiflexion  10    Left Ankle Plantar Flexion  30         [facility] Adult PT Treatment/Exercise - 05/16/17 0001      Knee/Hip Exercises: Stretches   Active Hamstring Stretch  Right;3 reps;10 seconds contract relax x 3     ITB Stretch  Right;3 reps    Gastroc Stretch  Both;3 reps    Soleus Stretch  Both;3 reps    Other Knee/Hip Stretches   slantboard       Knee/Hip Exercises: Aerobic   Nustep  6  minutes UE/LE,  L4      Knee/Hip Exercises: Standing   Heel Raises  Both;2 sets;15 reps eccentric focus off step       Knee/Hip Exercises: Supine   Bridges with Ball Squeeze  Strengthening;Both;1 set;10 reps    Single Leg Bridge  Strengthening;Both;1 set;10 reps    Patellar Mobs  2-3 min     Other Supine Knee/Hip Exercises  ball squeeze with knee ext.  x 10 each side       Modalities   Modalities  Iontophoresis      Iontophoresis   Type of Iontophoresis  Dexamethasone    Location  L achilles     Dose  1 cc    Time  6 hour patch      Manual Therapy   Soft tissue mobilization  IASTM, Quads, ITB and lateral hamstring, IASTM tolerated well, patellar mobs with active extension hamstring/lateral, L Achilles, /gastroc/soleus             PT Education - 05/16/17 1156    Education provided  Yes    Education Details  iontophoresis, eccentric heel raise    Person(s) Educated  Patient    Methods  Explanation    Comprehension  Verbalized understanding       PT Short Term Goals - 05/14/17 1249      PT SHORT TERM GOAL #1   Title  Pt will be I with initial HEP for ROM and strength in L. knee     Baseline  cues needed for technique,  minor    Time  4    Status  On-going      PT SHORT TERM GOAL #2   Title  Pt will be able to go without compression stocking for up to 3 hours and manage edema     Baseline  weaning from compression stocking    Time  4    Period  Weeks    Status  On-going      PT SHORT TERM GOAL #3   Title  Pt will achieve no more than 10 deg knee extension for improved gait, flexibility.     Baseline  22    Time  4  Period  Weeks    Status  On-going        PT Long Term Goals - 04/28/17 1227      PT LONG TERM GOAL #1   Title  Pt will understand RICE, posture and body mechanics as it pertains to knee and back pain.     Time  8    Period  Weeks    Status  New    Target Date  06/23/17       PT LONG TERM GOAL #2   Title  Pt will be I with more advanced HEP for knee, core.     Time  8    Period  Weeks    Status  New    Target Date  06/23/17      PT LONG TERM GOAL #3   Title  Pt will less than 60% limited on FOTO to demo functional improvement    Baseline  85%    Period  Weeks    Status  New    Target Date  06/23/17      PT LONG TERM GOAL #4   Title  Pt will be able to go outside and participate in family functions without limitations of knee pain, including walking on uneven ground.     Time  8    Period  Weeks    Status  New    Target Date  06/23/17      PT LONG TERM GOAL #5   Title  Pt will be able to walk for 30 min for general health and fitness without lasting knee pain     Time  8    Period  Weeks    Status  New    Target Date  06/23/17            Plan - 05/16/17 1122    Clinical Impression Statement  Patient with new onset of L Achilles and Benjiman Core, PA wrote Rx for PT.  He has good AROM of ankle, very localized pain on L calcaneus.  Worked on ROM and release of soft tissue of L Gastroc/soleus and Rt quad, hamstrings.  Did not measure Rt Knee ROM     PT Treatment/Interventions  ADLs/Self Care Home Management;Cryotherapy;Moist Heat;Electrical Stimulation;Therapeutic activities;Therapeutic exercise;Balance training;Patient/family education;Manual techniques;Passive range of motion;Neuromuscular re-education;Taping;Vasopneumatic Device;Functional mobility training;Gait training;Stair training;DME Instruction;Iontophoresis 4mg /ml Dexamethasone    PT Next Visit Plan  ionto L heel, [place], stretch, eccentric heel raise off step , cont knee ROM, extension     PT Home Exercise Plan  hamstring stretch, quad set, LAQ (has full HEP from previous episode but told to focus on knee ext ) , wall slide for quads     Consulted and Agree with Plan of Care  Patient       Patient will benefit from skilled therapeutic intervention in order to improve the following deficits  and impairments:  Abnormal gait, Decreased mobility, Difficulty walking, Impaired sensation, Improper body mechanics, Decreased scar mobility, Decreased range of motion, Decreased activity tolerance, Decreased strength, Increased fascial restricitons, Impaired flexibility, Pain  Visit Diagnosis: Difficulty in walking, not elsewhere classified  Localized edema  Acute pain of right knee  Pain in left ankle and joints of left foot     Problem List Patient Active Problem List   Diagnosis Date Noted  . Closed fracture of right ankle   . Cellulitis of right leg 05/03/2017  . Hypokalemia 05/03/2017  . Hypertension 05/03/2017  . GERD (gastroesophageal reflux disease) 05/03/2017  .  Anxiety 05/03/2017  . S/P total knee replacement using cement, right 04/11/2017  . Status post total left knee replacement 09/26/2016  . Presence of left artificial knee joint 07/25/2016  . Pain and swelling of left lower leg 07/25/2016  . Bilateral primary osteoarthritis of knee 07/19/2016    Class: Chronic  . Unilateral primary osteoarthritis, right knee 07/19/2016  . 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 Hypotestosteronemia 09/08/2015  . Cervical spondylosis without myelopathy 03/25/2014    Class: Chronic  . Left cervical radiculopathy 03/25/2014    Class: Chronic  . Cervical spondylosis with radiculopathy 03/25/2014    Lyal Husted 05/16/2017, 11:59 AM  [place], [place], [address] Phone: [phone]   Fax:  [phone]  Name: WARD BOISSONNEAULT MRN: [id number] Date of Birth: 1965/07/16  Raeford Razor, PT 05/16/17 11:59 AM Phone: [phone] Fax: [phone]

## 2017-05-19 ENCOUNTER — Ambulatory Visit: Payer: BLUE CROSS/BLUE SHIELD | Admitting: Physical Therapy

## 2017-05-19 DIAGNOSIS — M25662 Stiffness of left knee, not elsewhere classified: Secondary | ICD-10-CM

## 2017-05-19 DIAGNOSIS — R262 Difficulty in walking, not elsewhere classified: Secondary | ICD-10-CM | POA: Diagnosis not present

## 2017-05-19 DIAGNOSIS — M25562 Pain in left knee: Secondary | ICD-10-CM

## 2017-05-19 DIAGNOSIS — M25572 Pain in left ankle and joints of left foot: Secondary | ICD-10-CM

## 2017-05-19 DIAGNOSIS — R600 Localized edema: Secondary | ICD-10-CM

## 2017-05-19 DIAGNOSIS — M25561 Pain in right knee: Secondary | ICD-10-CM

## 2017-05-19 NOTE — Therapy (Signed)
[place], [place], [address] Phone: [phone]   Fax:  [phone]  Physical Therapy Treatment  Patient Details  Name: Jesus Brown MRN: [id number] Date of Birth: 1966-02-26 Referring Provider: Dr. Basil Dess   Encounter Date: 05/19/2017  PT End of Session - 05/19/17 1122    Visit Number  5    Number of Visits  24    Date for PT Re-Evaluation  06/23/17    PT Start Time  1107    PT Stop Time  1145    PT Time Calculation (min)  38 min    Activity Tolerance  Patient tolerated treatment well    Behavior During Therapy  [facility] for tasks assessed/performed       Past Medical History:  Diagnosis Date  . Anxiety    mainly r/t pain  . Arthritis    neck, knees  . Asthma    AS CHILD   . Back pain, chronic   . DDD (degenerative disc disease)   . Dysphagia   . 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 GERD (gastroesophageal reflux disease)   . Hypertension   . Hypotestosteronemia 09/08/2015  . Neck pain, chronic     Past Surgical History:  Procedure Laterality Date  . ANKLE SURGERY    . BACK SURGERY  2010,2011   lumb fusionx2  . CERVICAL FUSION  3559,7416   3 surgeries  . DIRECT LARYNGOSCOPY N/A 11/09/2012   Procedure: DIRECT LARYNGOSCOPY;  Surgeon: Jodi Marble, MD;  Location: [place];  Service: ENT;  Laterality: N/A;  . ESOPHAGOGASTRODUODENOSCOPY N/A 08/03/2012   Procedure: ESOPHAGOGASTRODUODENOSCOPY (EGD);  Surgeon: Arta Silence, MD;  Location: [facility] ENDOSCOPY;  Service: Endoscopy;  Laterality: N/A;  . ESOPHAGOSCOPY W/ BOTOX INJECTION N/A 11/09/2012   Procedure: ESOPHAGOSCOPY POSSIBLE BIOPSY AND POSSIBLE BOTOX INJECTION CRICOPHARYNGEUS;  Surgeon: Jodi Marble, MD;  Location: [place];  Service: ENT;  Laterality: N/A;  Esophagoscopy with botox injection  . HERNIA REPAIR     ing-rt  . KNEE SURGERY     rt and left x2 each=4  . POSTERIOR CERVICAL  FUSION/FORAMINOTOMY N/A 03/25/2014   Procedure: LEFT C7-T1 FORAMINOTOMY;  Surgeon: Jessy Oto, MD;  Location: [place];  Service: Orthopedics;  Laterality: N/A;  . [place]   rt and lt  . TONSILLECTOMY    . TOTAL KNEE ARTHROPLASTY Left 07/19/2016   Procedure: LEFT TOTAL KNEE ARTHROPLASTY CORTISON INJECTION IN RIGHT KNEE;  Surgeon: Jessy Oto, MD;  Location: [place];  Service: Orthopedics;  Laterality: Left;  MAY NEED RNFA TO FINISH CASE PER [place]   . TOTAL KNEE ARTHROPLASTY Right 04/11/2017   Procedure: RIGHT TOTAL KNEE ARTHROPLASTY;  Surgeon: Jessy Oto, MD;  Location: [place];  Service: Orthopedics;  Laterality: Right;  . WRIST SURGERY     lt and rt    There were no vitals filed for this visit.  Subjective Assessment - 05/19/17 1111    Subjective  It was like I had full motion the other day.  Pt att late. Using my Peloton bike.  I use it 15 min x 3 per day.  L heel is getting better.      Currently in Pain?  No/denies    Pain Score  4     Pain Location  Knee    Pain Orientation  Right    Pain Descriptors / Indicators  Aching    Pain Score  2  Pain Location  Heel    Pain Orientation  Left    Pain Descriptors / Indicators  Sharp    Pain Type  Acute pain    Pain Onset  1 to 4 weeks ago    Pain Frequency  Intermittent         [facility] PT Assessment - 05/19/17 0001      AROM   Right Knee Extension  15          [facility] Adult PT Treatment/Exercise - 05/19/17 0001      Knee/Hip Exercises: Stretches   Gastroc Stretch  Both;3 reps    Other Knee/Hip Stretches  step stretch       Knee/Hip Exercises: Aerobic   Stationary Bike  5 min L 2       Knee/Hip Exercises: Standing   Heel Raises  Both;3 sets;10 reps focus on eccentric     Wall Squat  2 sets;10 reps iso hold with toe taps and heel taps x 10     Other Standing Knee Exercises  TKE into ball x 10       Knee/Hip Exercises: Prone   Prone Knee Hang  3 minutes      Ultrasound   Ultrasound Location  L heel     Ultrasound Parameters  50%, 1.0 W/cm2, 1 MHz    Ultrasound Goals  Pain      Iontophoresis   Type of Iontophoresis  Dexamethasone    Location  L [place]     Dose  1 cc    Time  6 hour patch             PT Education - 05/19/17 1154    Education provided  Yes    Education Details  ionto    Person(s) Educated  Patient    Methods  Explanation    Comprehension  Verbalized understanding;Returned demonstration       PT Short Term Goals - 05/14/17 1249      PT SHORT TERM GOAL #1   Title  Pt will be I with initial HEP for ROM and strength in L. knee     Baseline  cues needed for technique,  minor    Time  4    Status  On-going      PT SHORT TERM GOAL #2   Title  Pt will be able to go without compression stocking for up to 3 hours and manage edema     Baseline  weaning from compression stocking    Time  4    Period  Weeks    Status  On-going      PT SHORT TERM GOAL #3   Title  Pt will achieve no more than 10 deg knee extension for improved gait, flexibility.     Baseline  22    Time  4    Period  Weeks    Status  On-going        PT Long Term Goals - 04/28/17 1227      PT LONG TERM GOAL #1   Title  Pt will understand RICE, posture and body mechanics as it pertains to knee and back pain.     Time  8    Period  Weeks    Status  New    Target Date  06/23/17      PT LONG TERM GOAL #2   Title  Pt will be I with more advanced HEP for knee, core.     Time  8    Period  Weeks    Status  New    Target Date  06/23/17      PT LONG TERM GOAL #3   Title  Pt will less than 60% limited on FOTO to demo functional improvement    Baseline  85%    Period  Weeks    Status  New    Target Date  06/23/17      PT LONG TERM GOAL #4   Title  Pt will be able to go outside and participate in family functions without limitations of knee pain, including walking on uneven ground.     Time  8    Period  Weeks    Status  New    Target Date  06/23/17      PT LONG TERM GOAL #5    Title  Pt will be able to walk for 30 min for general health and fitness without lasting knee pain     Time  8    Period  Weeks    Status  New    Target Date  06/23/17            Plan - 05/19/17 1156    Clinical Impression Statement  Patient reporting overall less pain in L heel, seeing progress with knee ROM.  Continued ionto patch and added ultrasound to continue to address inflammation.     PT Treatment/Interventions  ADLs/Self Care Home Management;Cryotherapy;Moist Heat;Electrical Stimulation;Therapeutic activities;Therapeutic exercise;Balance training;Patient/family education;Manual techniques;Passive range of motion;Neuromuscular re-education;Taping;Vasopneumatic Device;Functional mobility training;Gait training;Stair training;DME Instruction;Iontophoresis 4mg /ml Dexamethasone    PT Next Visit Plan  ionto L heel, [place], stretch, eccentric heel raise off step , cont knee ROM, extension     PT Home Exercise Plan  hamstring stretch, quad set, LAQ (has full HEP from previous episode but told to focus on knee ext ) , wall slide for quads     Consulted and Agree with Plan of Care  Patient       Patient will benefit from skilled therapeutic intervention in order to improve the following deficits and impairments:  Abnormal gait, Decreased mobility, Difficulty walking, Impaired sensation, Improper body mechanics, Decreased scar mobility, Decreased range of motion, Decreased activity tolerance, Decreased strength, Increased fascial restricitons, Impaired flexibility, Pain  Visit Diagnosis: Difficulty in walking, not elsewhere classified  Localized edema  Acute pain of right knee  Pain in left ankle and joints of left foot  Acute pain of left knee  Stiffness of left knee, not elsewhere classified     Problem List Patient Active Problem List   Diagnosis Date Noted  . Closed fracture of right ankle   . Cellulitis of right leg 05/03/2017  . Hypokalemia 05/03/2017  . Hypertension  05/03/2017  . GERD (gastroesophageal reflux disease) 05/03/2017  . Anxiety 05/03/2017  . S/P total knee replacement using cement, right 04/11/2017  . Status post total left knee replacement 09/26/2016  . Presence of left artificial knee joint 07/25/2016  . Pain and swelling of left lower leg 07/25/2016  . Bilateral primary osteoarthritis of knee 07/19/2016    Class: Chronic  . Unilateral primary osteoarthritis, right knee 07/19/2016  . 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 Hypotestosteronemia 09/08/2015  . Cervical spondylosis without myelopathy 03/25/2014    Class: Chronic  . Left cervical radiculopathy 03/25/2014    Class: Chronic  . Cervical spondylosis with radiculopathy 03/25/2014    Iverson Sees 05/19/2017, 12:01 PM  [place]  [place], [place], [address] Phone: [phone]   Fax:  [phone]  Name: Jesus Brown MRN: [id number] Date of Birth: September 21, 1965  Raeford Razor, PT 05/19/17 12:01 PM Phone: [phone] Fax: [phone]

## 2017-05-19 NOTE — Telephone Encounter (Signed)
Prescription was for him to give to physical therapist

## 2017-05-19 NOTE — Patient Instructions (Signed)

## 2017-05-20 NOTE — Telephone Encounter (Signed)
I called and spoke with patient. He states that he already has clarification and it was worked out with [facility].

## 2017-05-21 ENCOUNTER — Encounter: Payer: Self-pay | Admitting: Physical Therapy

## 2017-05-21 ENCOUNTER — Ambulatory Visit: Payer: BLUE CROSS/BLUE SHIELD | Admitting: Physical Therapy

## 2017-05-21 DIAGNOSIS — M25662 Stiffness of left knee, not elsewhere classified: Secondary | ICD-10-CM

## 2017-05-21 DIAGNOSIS — R262 Difficulty in walking, not elsewhere classified: Secondary | ICD-10-CM | POA: Diagnosis not present

## 2017-05-21 DIAGNOSIS — M25562 Pain in left knee: Secondary | ICD-10-CM

## 2017-05-21 DIAGNOSIS — M25572 Pain in left ankle and joints of left foot: Secondary | ICD-10-CM

## 2017-05-21 DIAGNOSIS — M25561 Pain in right knee: Secondary | ICD-10-CM

## 2017-05-21 DIAGNOSIS — R600 Localized edema: Secondary | ICD-10-CM

## 2017-05-21 NOTE — Therapy (Signed)
[place], [place], [address] Phone: [phone]   Fax:  [phone]  Physical Therapy Treatment  Patient Details  Name: Jesus Brown MRN: [id number] Date of Birth: [birth year] Referring Provider: Dr. Basil Dess   Encounter Date: 05/21/2017  PT End of Session - 05/21/17 1042    Visit Number  6    Number of Visits  24    Date for PT Re-Evaluation  06/23/17    PT Start Time  1017    PT Stop Time  1100    PT Time Calculation (min)  43 min    Activity Tolerance  Patient tolerated treatment well    Behavior During Therapy  [facility] for tasks assessed/performed       Past Medical History:  Diagnosis Date  . Anxiety    mainly r/t pain  . Arthritis    neck, knees  . Asthma    AS CHILD   . Back pain, chronic   . DDD (degenerative disc disease)   . Dysphagia   . 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 GERD (gastroesophageal reflux disease)   . Hypertension   . Hypotestosteronemia 09/08/2015  . Neck pain, chronic     Past Surgical History:  Procedure Laterality Date  . ANKLE SURGERY    . BACK SURGERY  2010,2011   lumb fusionx2  . CERVICAL FUSION  4128,7867   3 surgeries  . DIRECT LARYNGOSCOPY N/A 11/09/2012   Procedure: DIRECT LARYNGOSCOPY;  Surgeon: Jodi Marble, MD;  Location: [place];  Service: ENT;  Laterality: N/A;  . ESOPHAGOGASTRODUODENOSCOPY N/A 08/03/2012   Procedure: ESOPHAGOGASTRODUODENOSCOPY (EGD);  Surgeon: Arta Silence, MD;  Location: [facility] ENDOSCOPY;  Service: Endoscopy;  Laterality: N/A;  . ESOPHAGOSCOPY W/ BOTOX INJECTION N/A 11/09/2012   Procedure: ESOPHAGOSCOPY POSSIBLE BIOPSY AND POSSIBLE BOTOX INJECTION CRICOPHARYNGEUS;  Surgeon: Jodi Marble, MD;  Location: [place];  Service: ENT;  Laterality: N/A;  Esophagoscopy with botox injection  . HERNIA REPAIR     ing-rt  . KNEE SURGERY     rt and left x2 each=4  . POSTERIOR CERVICAL  FUSION/FORAMINOTOMY N/A 03/25/2014   Procedure: LEFT C7-T1 FORAMINOTOMY;  Surgeon: Jessy Oto, MD;  Location: [place];  Service: Orthopedics;  Laterality: N/A;  . [place]   rt and lt  . TONSILLECTOMY    . TOTAL KNEE ARTHROPLASTY Left 07/19/2016   Procedure: LEFT TOTAL KNEE ARTHROPLASTY CORTISON INJECTION IN RIGHT KNEE;  Surgeon: Jessy Oto, MD;  Location: [place];  Service: Orthopedics;  Laterality: Left;  MAY NEED RNFA TO FINISH CASE PER [place]   . TOTAL KNEE ARTHROPLASTY Right 04/11/2017   Procedure: RIGHT TOTAL KNEE ARTHROPLASTY;  Surgeon: Jessy Oto, MD;  Location: [place];  Service: Orthopedics;  Laterality: Right;  . WRIST SURGERY     lt and rt    There were no vitals filed for this visit.  Subjective Assessment - 05/21/17 1029    Subjective  My heel is alot better.  Knee gets hot at night sometimes.  I have been really working this leg.      Currently in Pain?  Yes    Pain Score  3     Pain Location  Knee    Pain Orientation  Right    Pain Descriptors / Indicators  Sore    Pain Type  Surgical pain    Pain Onset  More than a month ago  Pain Frequency  Intermittent    Pain Score  1    Pain Orientation  Left    Pain Descriptors / Indicators  Sore    Pain Type  Acute pain    Pain Onset  More than a month ago    Pain Frequency  Intermittent       Pilates Reformer used for LE/core strength, postural strength, lumbopelvic disassociation and core control.  Exercises included: Footwork 2 Red 1 Green bilateral parallel on heels, turnout on heels  2 red 1 blue forefoot with stretching  Prancing for dynamic stretching Feet in Straps 1 Red hamstring stretch, single leg arcs x 10 with caution for back pain. 1 Red 1 Yellow Arcs, squats x 10 each   Cues for full extension of knees and maintaining lumbar     [facility] Adult PT Treatment/Exercise - 05/21/17 0001      Iontophoresis   Type of Iontophoresis  Dexamethasone    Location  L [place]     Dose  1 cc     Time  6 hour patch      Manual Therapy   Manual therapy comments  PROM bilateral ankle DF    Soft tissue mobilization  distal gastroc, soleus, IASTM to tolerance          PT Education - 05/21/17 1033    Education provided  Yes    Education Details  Reformer footwork     Person(s) Educated  Patient    Methods  Explanation    Comprehension  Verbalized understanding       PT Short Term Goals - 05/14/17 1249      PT SHORT TERM GOAL #1   Title  Pt will be I with initial HEP for ROM and strength in L. knee     Baseline  cues needed for technique,  minor    Time  4    Status  On-going      PT SHORT TERM GOAL #2   Title  Pt will be able to go without compression stocking for up to 3 hours and manage edema     Baseline  weaning from compression stocking    Time  4    Period  Weeks    Status  On-going      PT SHORT TERM GOAL #3   Title  Pt will achieve no more than 10 deg knee extension for improved gait, flexibility.     Baseline  22    Time  4    Period  Weeks    Status  On-going        PT Long Term Goals - 04/28/17 1227      PT LONG TERM GOAL #1   Title  Pt will understand RICE, posture and body mechanics as it pertains to knee and back pain.     Time  8    Period  Weeks    Status  New    Target Date  06/23/17      PT LONG TERM GOAL #2   Title  Pt will be I with more advanced HEP for knee, core.     Time  8    Period  Weeks    Status  New    Target Date  06/23/17      PT LONG TERM GOAL #3   Title  Pt will less than 60% limited on FOTO to demo functional improvement    Baseline  85%    Period  Weeks    Status  New    Target Date  06/23/17      PT LONG TERM GOAL #4   Title  Pt will be able to go outside and participate in family functions without limitations of knee pain, including walking on uneven ground.     Time  8    Period  Weeks    Status  New    Target Date  06/23/17      PT LONG TERM GOAL #5   Title  Pt will be able to walk for 30 min for  general health and fitness without lasting knee pain     Time  8    Period  Weeks    Status  New    Target Date  06/23/17            Plan - 05/21/17 1247    Clinical Impression Statement  Worked on LE strengthening and ankle/knee flexibility on the Reformer.  Knee extension lacking about 18 deg when in supine.  Repeated ionto patch to heel.     PT Treatment/Interventions  ADLs/Self Care Home Management;Cryotherapy;Moist Heat;Electrical Stimulation;Therapeutic activities;Therapeutic exercise;Balance training;Patient/family education;Manual techniques;Passive range of motion;Neuromuscular re-education;Taping;Vasopneumatic Device;Functional mobility training;Gait training;Stair training;DME Instruction;Iontophoresis 4mg /ml Dexamethasone    PT Next Visit Plan  ionto L heel, [place], stretch, eccentric heel raise off step , cont knee ROM, extension     PT Home Exercise Plan  hamstring stretch, quad set, LAQ (has full HEP from previous episode but told to focus on knee ext ) , wall slide for quads     Consulted and Agree with Plan of Care  Patient       Patient will benefit from skilled therapeutic intervention in order to improve the following deficits and impairments:  Abnormal gait, Decreased mobility, Difficulty walking, Impaired sensation, Improper body mechanics, Decreased scar mobility, Decreased range of motion, Decreased activity tolerance, Decreased strength, Increased fascial restricitons, Impaired flexibility, Pain  Visit Diagnosis: Difficulty in walking, not elsewhere classified  Localized edema  Acute pain of right knee  Pain in left ankle and joints of left foot  Acute pain of left knee  Stiffness of left knee, not elsewhere classified     Problem List Patient Active Problem List   Diagnosis Date Noted  . Closed fracture of right ankle   . Cellulitis of right leg 05/03/2017  . Hypokalemia 05/03/2017  . Hypertension 05/03/2017  . GERD (gastroesophageal reflux  disease) 05/03/2017  . Anxiety 05/03/2017  . S/P total knee replacement using cement, right 04/11/2017  . Status post total left knee replacement 09/26/2016  . Presence of left artificial knee joint 07/25/2016  . Pain and swelling of left lower leg 07/25/2016  . Bilateral primary osteoarthritis of knee 07/19/2016    Class: Chronic  . Unilateral primary osteoarthritis, right knee 07/19/2016  . 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 Hypotestosteronemia 09/08/2015  . Cervical spondylosis without myelopathy 03/25/2014    Class: Chronic  . Left cervical radiculopathy 03/25/2014    Class: Chronic  . Cervical spondylosis with radiculopathy 03/25/2014    PAA,JENNIFER 05/21/2017, 12:52 PM  [facility] [address] [place], [place], [address] Phone: [phone]   Fax:  [phone]  Name: Jesus Brown MRN: [id number] Date of Birth: 05-05-1965   Raeford Razor, PT 05/21/17 12:54 PM Phone: [phone] Fax: [phone]

## 2017-05-27 ENCOUNTER — Encounter: Payer: Self-pay | Admitting: Physical Therapy

## 2017-05-27 ENCOUNTER — Ambulatory Visit: Payer: BLUE CROSS/BLUE SHIELD | Admitting: Physical Therapy

## 2017-05-27 DIAGNOSIS — M25572 Pain in left ankle and joints of left foot: Secondary | ICD-10-CM

## 2017-05-27 DIAGNOSIS — R262 Difficulty in walking, not elsewhere classified: Secondary | ICD-10-CM | POA: Diagnosis not present

## 2017-05-27 DIAGNOSIS — M25561 Pain in right knee: Secondary | ICD-10-CM

## 2017-05-27 DIAGNOSIS — M25662 Stiffness of left knee, not elsewhere classified: Secondary | ICD-10-CM

## 2017-05-27 DIAGNOSIS — M25562 Pain in left knee: Secondary | ICD-10-CM

## 2017-05-27 DIAGNOSIS — R600 Localized edema: Secondary | ICD-10-CM

## 2017-05-27 NOTE — Patient Instructions (Signed)
Walk softer.  Use cane as needed  Shorten steps if pain flares.

## 2017-05-27 NOTE — Therapy (Signed)
[place], [place], [address] Phone: [phone]   Fax:  [phone]  Physical Therapy Treatment  Patient Details  Name: Jesus Brown MRN: [id number] Date of Birth: 07-21-1965 Referring Provider: Dr. Basil Dess   Encounter Date: 05/27/2017  PT End of Session - 05/27/17 1555    Visit Number  7    Number of Visits  24    Date for PT Re-Evaluation  06/23/17    PT Start Time  1505    PT Stop Time  1549    PT Time Calculation (min)  44 min    Activity Tolerance  Patient tolerated treatment well    Behavior During Therapy  [facility] for tasks assessed/performed       Past Medical History:  Diagnosis Date  . Anxiety    mainly r/t pain  . Arthritis    neck, knees  . Asthma    AS CHILD   . Back pain, chronic   . DDD (degenerative disc disease)   . Dysphagia   . 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 GERD (gastroesophageal reflux disease)   . Hypertension   . Hypotestosteronemia 09/08/2015  . Neck pain, chronic     Past Surgical History:  Procedure Laterality Date  . ANKLE SURGERY    . BACK SURGERY  2010,2011   lumb fusionx2  . CERVICAL FUSION  6237,6283   3 surgeries  . DIRECT LARYNGOSCOPY N/A 11/09/2012   Procedure: DIRECT LARYNGOSCOPY;  Surgeon: Jodi Marble, MD;  Location: [place];  Service: ENT;  Laterality: N/A;  . ESOPHAGOGASTRODUODENOSCOPY N/A 08/03/2012   Procedure: ESOPHAGOGASTRODUODENOSCOPY (EGD);  Surgeon: Arta Silence, MD;  Location: [facility] ENDOSCOPY;  Service: Endoscopy;  Laterality: N/A;  . ESOPHAGOSCOPY W/ BOTOX INJECTION N/A 11/09/2012   Procedure: ESOPHAGOSCOPY POSSIBLE BIOPSY AND POSSIBLE BOTOX INJECTION CRICOPHARYNGEUS;  Surgeon: Jodi Marble, MD;  Location: [place];  Service: ENT;  Laterality: N/A;  Esophagoscopy with botox injection  . HERNIA REPAIR     ing-rt  . KNEE SURGERY     rt and left x2 each=4  . POSTERIOR CERVICAL  FUSION/FORAMINOTOMY N/A 03/25/2014   Procedure: LEFT C7-T1 FORAMINOTOMY;  Surgeon: Jessy Oto, MD;  Location: [place];  Service: Orthopedics;  Laterality: N/A;  . [place]   rt and lt  . TONSILLECTOMY    . TOTAL KNEE ARTHROPLASTY Left 07/19/2016   Procedure: LEFT TOTAL KNEE ARTHROPLASTY CORTISON INJECTION IN RIGHT KNEE;  Surgeon: Jessy Oto, MD;  Location: [place];  Service: Orthopedics;  Laterality: Left;  MAY NEED RNFA TO FINISH CASE PER [place]   . TOTAL KNEE ARTHROPLASTY Right 04/11/2017   Procedure: RIGHT TOTAL KNEE ARTHROPLASTY;  Surgeon: Jessy Oto, MD;  Location: [place];  Service: Orthopedics;  Laterality: Right;  . WRIST SURGERY     lt and rt    There were no vitals filed for this visit.  Subjective Assessment - 05/27/17 1507    Subjective  Shoulder left is locking up.  i am going to get a new order for it.  Achilles is is getting better.       Currently in Pain?  Yes    Pain Score  4     Pain Orientation  Right due to favoring the left    Pain Frequency  Constant    Aggravating Factors   cold weather    Pain Score  2    Pain Location  Heel  Pain Orientation  Left    Pain Descriptors / Indicators  Sore    Pain Type  Acute pain    Aggravating Factors   weightbearing                      [facility] Adult PT Treatment/Exercise - 05/27/17 0001      Knee/Hip Exercises: Stretches   Quad Stretch  1 rep;30 seconds hamstring cramp (Prone)      Ultrasound   Ultrasound Location  Lt heel    Ultrasound Parameters  50%,  1.0 watts/cm2  X 8 minutes    Ultrasound Goals  Pain      Iontophoresis   Type of Iontophoresis  Dexamethasone    Location  L achilles     Dose  1 cc    Time  6 hour patch      Manual Therapy   Manual Therapy  -- tape distal quads to pull patella down, to reomve tonight     Manual therapy comments  PROM left achilles and stretch with toe stretches,  and with glides posterior of lateral ankle tissue with PROM DF              PT Education - 05/27/17 1555    Education provided  Yes    Education Details  self care    Person(s) Educated  Patient    Methods  Explanation    Comprehension  Verbalized understanding       PT Short Term Goals - 05/14/17 1249      PT SHORT TERM GOAL #1   Title  Pt will be I with initial HEP for ROM and strength in L. knee     Baseline  cues needed for technique,  minor    Time  4    Status  On-going      PT SHORT TERM GOAL #2   Title  Pt will be able to go without compression stocking for up to 3 hours and manage edema     Baseline  weaning from compression stocking    Time  4    Period  Weeks    Status  On-going      PT SHORT TERM GOAL #3   Title  Pt will achieve no more than 10 deg knee extension for improved gait, flexibility.     Baseline  22    Time  4    Period  Weeks    Status  On-going        PT Long Term Goals - 04/28/17 1227      PT LONG TERM GOAL #1   Title  Pt will understand RICE, posture and body mechanics as it pertains to knee and back pain.     Time  8    Period  Weeks    Status  New    Target Date  06/23/17      PT LONG TERM GOAL #2   Title  Pt will be I with more advanced HEP for knee, core.     Time  8    Period  Weeks    Status  New    Target Date  06/23/17      PT LONG TERM GOAL #3   Title  Pt will less than 60% limited on FOTO to demo functional improvement    Baseline  85%    Period  Weeks    Status  New    Target Date  06/23/17  PT LONG TERM GOAL #4   Title  Pt will be able to go outside and participate in family functions without limitations of knee pain, including walking on uneven ground.     Time  8    Period  Weeks    Status  New    Target Date  06/23/17      PT LONG TERM GOAL #5   Title  Pt will be able to walk for 30 min for general health and fitness without lasting knee pain     Time  8    Period  Weeks    Status  New    Target Date  06/23/17            Plan - 05/27/17 1555     Clinical Impression Statement  Patient has been consistant with his HEP and did not feel he needed practice.  Modalities continued.  pain resolving.  Patient thinKs he may have a leg length difference due the fact his Dad did.  Less discomfort with knee extension and walking after application of [place] tape to pull patella inferior. Patient not wearing . Patient able to walk with less pounding of the floor with cues.    PT Next Visit Plan  ionto L heel, [place], stretch, eccentric heel raise off step , cont knee ROM, extension .  quad stretch with patella distal glides.     PT Home Exercise Plan  hamstring stretch, quad set, LAQ (has full HEP from previous episode but told to focus on knee ext ) , wall slide for quads     Consulted and Agree with Plan of Care  Patient       Patient will benefit from skilled therapeutic intervention in order to improve the following deficits and impairments:     Visit Diagnosis: Difficulty in walking, not elsewhere classified  Localized edema  Acute pain of right knee  Pain in left ankle and joints of left foot  Acute pain of left knee  Stiffness of left knee, not elsewhere classified     Problem List Patient Active Problem List   Diagnosis Date Noted  . Closed fracture of right ankle   . Cellulitis of right leg 05/03/2017  . Hypokalemia 05/03/2017  . Hypertension 05/03/2017  . GERD (gastroesophageal reflux disease) 05/03/2017  . Anxiety 05/03/2017  . S/P total knee replacement using cement, right 04/11/2017  . Status post total left knee replacement 09/26/2016  . Presence of left artificial knee joint 07/25/2016  . Pain and swelling of left lower leg 07/25/2016  . Bilateral primary osteoarthritis of knee 07/19/2016    Class: Chronic  . Unilateral primary osteoarthritis, right knee 07/19/2016  . 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 Hypotestosteronemia 09/08/2015  . Cervical spondylosis without myelopathy 03/25/2014     Class: Chronic  . Left cervical radiculopathy 03/25/2014    Class: Chronic  . Cervical spondylosis with radiculopathy 03/25/2014    Ishi Danser PTA 05/27/2017, 4:04 PM  [facility] [address] [place], [place], [address] Phone: [phone]   Fax:  [phone]  Name: OLAJUWON FOSDICK MRN: [id number] Date of Birth: April 29, 1965

## 2017-06-02 ENCOUNTER — Ambulatory Visit: Payer: BLUE CROSS/BLUE SHIELD | Admitting: Physical Therapy

## 2017-06-02 DIAGNOSIS — M25512 Pain in left shoulder: Secondary | ICD-10-CM

## 2017-06-02 DIAGNOSIS — R600 Localized edema: Secondary | ICD-10-CM

## 2017-06-02 DIAGNOSIS — M25612 Stiffness of left shoulder, not elsewhere classified: Secondary | ICD-10-CM

## 2017-06-02 DIAGNOSIS — R262 Difficulty in walking, not elsewhere classified: Secondary | ICD-10-CM | POA: Diagnosis not present

## 2017-06-02 DIAGNOSIS — M6281 Muscle weakness (generalized): Secondary | ICD-10-CM

## 2017-06-02 NOTE — Patient Instructions (Signed)
ROM: Flexion - Wand (Supine)    Lie on back holding wand. Raise arms over head.  Repeat __10__ times per set. Do ___1-2_ sets per session. Do __2__ sessions per day.  http://orth.exer.us/928   Copyright  VHI. All rights reserved.   Resisted External Rotation: in Neutral - Bilateral    Sit or stand, tubing in both hands, elbows at sides, bent to 90, forearms forward. Pinch shoulder blades together and rotate forearms out. Keep elbows at sides. Repeat __20__ times per set. Do __1-2__ sets per session. Do ___2_ sessions per day.  http://orth.exer.us/966   Copyright  VHI. All rights reserved.

## 2017-06-02 NOTE — Therapy (Signed)
[place], [place], [address] Phone: [phone]   Fax:  [phone]  Physical Therapy Evaluation  Patient Details  Name: Jesus Brown MRN: [id number] Date of Birth: 1965-06-07 Referring Provider: Dr. Corinna Capra   Encounter Date: 06/02/2017  PT End of Session - 06/02/17 1518    Visit Number  1 shoulder     Number of Visits  12 shoulder    Date for PT Re-Evaluation  07/14/17 shoulder 6 week POC     PT Start Time  1418    PT Stop Time  1516    PT Time Calculation (min)  58 min    Activity Tolerance  Patient tolerated treatment well    Behavior During Therapy  [facility] for tasks assessed/performed       Past Medical History:  Diagnosis Date  . Anxiety    mainly r/t pain  . Arthritis    neck, knees  . Asthma    AS CHILD   . Back pain, chronic   . DDD (degenerative disc disease)   . Dysphagia   . 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 GERD (gastroesophageal reflux disease)   . Hypertension   . Hypotestosteronemia 09/08/2015  . Neck pain, chronic     Past Surgical History:  Procedure Laterality Date  . ANKLE SURGERY    . BACK SURGERY  2010,2011   lumb fusionx2  . CERVICAL FUSION  9798,9211   3 surgeries  . DIRECT LARYNGOSCOPY N/A 11/09/2012   Procedure: DIRECT LARYNGOSCOPY;  Surgeon: Jodi Marble, MD;  Location: [place];  Service: ENT;  Laterality: N/A;  . ESOPHAGOGASTRODUODENOSCOPY N/A 08/03/2012   Procedure: ESOPHAGOGASTRODUODENOSCOPY (EGD);  Surgeon: Arta Silence, MD;  Location: [facility] ENDOSCOPY;  Service: Endoscopy;  Laterality: N/A;  . ESOPHAGOSCOPY W/ BOTOX INJECTION N/A 11/09/2012   Procedure: ESOPHAGOSCOPY POSSIBLE BIOPSY AND POSSIBLE BOTOX INJECTION CRICOPHARYNGEUS;  Surgeon: Jodi Marble, MD;  Location: [place];  Service: ENT;  Laterality: N/A;  Esophagoscopy with botox injection  . HERNIA REPAIR     ing-rt  . KNEE SURGERY     rt and left x2  each=4  . POSTERIOR CERVICAL FUSION/FORAMINOTOMY N/A 03/25/2014   Procedure: LEFT C7-T1 FORAMINOTOMY;  Surgeon: Jessy Oto, MD;  Location: [place];  Service: Orthopedics;  Laterality: N/A;  . [place]   rt and lt  . TONSILLECTOMY    . TOTAL KNEE ARTHROPLASTY Left 07/19/2016   Procedure: LEFT TOTAL KNEE ARTHROPLASTY CORTISON INJECTION IN RIGHT KNEE;  Surgeon: Jessy Oto, MD;  Location: [place];  Service: Orthopedics;  Laterality: Left;  MAY NEED RNFA TO FINISH CASE PER [place]   . TOTAL KNEE ARTHROPLASTY Right 04/11/2017   Procedure: RIGHT TOTAL KNEE ARTHROPLASTY;  Surgeon: Jessy Oto, MD;  Location: [place];  Service: Orthopedics;  Laterality: Right;  . WRIST SURGERY     lt and rt    There were no vitals filed for this visit.   Subjective Assessment - 06/02/17 1420    Subjective  L shoulder aching.  Locking up when he puts his shirt on , reaching up and out.  I want to get a JAS for my Rt. knee. I think I may have slept on L side which I never do.  Does not identify lifting or injuring arm.      Pertinent History  Morton's Neuroma, Previous L TKA 2017.  Lumbar fusion , cervical fusion.  Chronic pain. , L  shoulder scope 17 yrs ago     Diagnostic tests  none for shoulder     Currently in Pain?  Yes    Pain Score  5     Pain Location  Shoulder    Pain Orientation  Left    Pain Descriptors / Indicators  Aching;Stabbing    Pain Type  Chronic pain    Pain Onset  More than a month ago    Pain Frequency  Intermittent    Aggravating Factors   reaching up and out     Pain Relieving Factors  resting, avoiding reaching    Effect of Pain on Daily Activities  limits at rest, aching          [facility] PT Assessment - 06/02/17 0001      Assessment   Medical Diagnosis  L shoulder bursitis     Referring Provider  Dr. Corinna Capra    Onset Date/Surgical Date  05/19/17    Hand Dominance  Right    Prior Therapy  Yes , current for knee and achilles      Precautions   Precautions   None      Balance Screen   Has the patient fallen in the past 6 months  No      [place] residence    Living Arrangements  Spouse/significant other    Available Help at Discharge  Family    Type of [place] to enter    Entrance Stairs-Number of Steps  2    Entrance Stairs-Rails  Can reach both    [place]  Two level    Alternate Level Stairs-Number of Steps  12    Alternate Level Stairs-Rails  Can reach both    [place] - standard      Prior Function   Level of Independence  Independent;Independent with basic ADLs;Independent with household mobility with device;Independent with community mobility without device    Vocation  On disability    Leisure  family, travel, being outdoors      Cognition   Overall Cognitive Status  Within Functional Limits for tasks assessed      Posture/Postural Control   Posture Comments  rounded shoulders , scapula abducted from midline       AROM   Left Shoulder Flexion  105 Degrees 155 in supine with cane     Left Shoulder ABduction  75 Degrees    Left Shoulder Internal Rotation  67 Degrees FR to low back     Left Shoulder External Rotation  55 Degrees FR unable to touch back of head       PROM   Overall PROM Comments  no pain with PROM abduction, flexion to 130 deg in supine , ERP in external rotation and internal rotation       Strength   Left Shoulder Flexion  3-/5 pain     Left Shoulder ABduction  3/5 pain     Left Shoulder Internal Rotation  5/5 no pain    Left Shoulder External Rotation  4-/5 pain       Palpation   Palpation comment  tender anterior superior aspect of shoulder, localized area, felt puffy, inflamed      Special Tests   Other special tests  Pos impingment signs              Objective measurements completed on examination: See above findings.  [place] Adult PT Treatment/Exercise - 06/02/17 0001      Knee/Hip Exercises:  Aerobic   Stationary Bike  6 min L 2 while doing subjetive for L UE eval       Shoulder Exercises: Supine   External Rotation  Strengthening;Both;20 reps;Theraband    Theraband Level (Shoulder External Rotation)  Level 2 (Red)    Flexion  AAROM;10 reps      Ultrasound   Ultrasound Location  Lt ant, superior shoulder     Ultrasound Parameters  50% , 1.0 W/cm2     Ultrasound Goals  Pain      Iontophoresis   Type of Iontophoresis  Dexamethasone    Location  L ant/superior shoulder     Dose  1 cc    Time  6 hour patch             PT Education - 06/02/17 1517    Education provided  Yes    Education Details  shoulder HEP, JAS for knee ext     Person(s) Educated  Patient    Methods  Explanation;Handout    Comprehension  Verbalized understanding;Returned demonstration       PT Short Term Goals - 05/14/17 1249      PT SHORT TERM GOAL #1   Title  Pt will be I with initial HEP for ROM and strength in L. knee     Baseline  cues needed for technique,  minor    Time  4    Status  On-going      PT SHORT TERM GOAL #2   Title  Pt will be able to go without compression stocking for up to 3 hours and manage edema     Baseline  weaning from compression stocking    Time  4    Period  Weeks    Status  On-going      PT SHORT TERM GOAL #3   Title  Pt will achieve no more than 10 deg knee extension for improved gait, flexibility.     Baseline  22    Time  4    Period  Weeks    Status  On-going        PT Long Term Goals - 06/02/17 1519      PT LONG TERM GOAL #6   Title  Pt will be able to complete ADLs, reaching in all directions without increase in shoulder pain (donning/doffing shirt)     Time  6    Period  Weeks    Status  New    Target Date  07/14/17      PT LONG TERM GOAL #7   Title  Pt will demo in LUE to 4+/5 in flexion, abduction for maximal functional use of L UE     Time  6    Period  Weeks    Status  New    Target Date  07/14/17             Plan -  06/02/17 1521    Clinical Impression Statement  Patient was evaluated for his L shoulder bursitis ordered by Dr. Corinna Capra.  He demonstrates weakness, pain and mild swelling in L anterior shoulder.  Limited ROM due to pain in sitting. Offered modalities for pain releief today and simple HEP for L UE.     Clinical Presentation  Stable    Clinical Decision Making  Low    Rehab Potential  Excellent    PT Frequency  3x / week 2 times for knee and 1 time for shoulder     PT Duration  6 weeks    PT Treatment/Interventions  ADLs/Self Care Home Management;Cryotherapy;Moist Heat;Electrical Stimulation;Therapeutic activities;Therapeutic exercise;Balance training;Patient/family education;Manual techniques;Passive range of motion;Neuromuscular re-education;Taping;Vasopneumatic Device;Functional mobility training;Gait training;Stair training;DME Instruction;Iontophoresis 4mg /ml Dexamethasone;Ultrasound    PT Next Visit Plan  FOTO FOR KNEE ionto L heel, [place], stretch, eccentric heel raise off step , cont knee ROM, extension .  quad stretch with patella distal glides. //// Shoulder: AROM to tolerance, scapular strength, patch, [place] to L ant shoulder. try tape?     PT Home Exercise Plan  hamstring stretch, quad set, LAQ (has full HEP from previous episode but told to focus on knee ext ) , wall slide for quads //////shoulder ER red band and flexion with wand, standing     Consulted and Agree with Plan of Care  Patient       Patient will benefit from skilled therapeutic intervention in order to improve the following deficits and impairments:  Abnormal gait, Decreased mobility, Difficulty walking, Impaired sensation, Improper body mechanics, Decreased scar mobility, Decreased range of motion, Decreased activity tolerance, Decreased strength, Increased fascial restricitons, Impaired flexibility, Pain, Increased edema, Impaired UE functional use  Visit Diagnosis: Acute pain of left shoulder  Stiffness of left shoulder,  not elsewhere classified  Muscle weakness (generalized)  Localized edema     Problem List Patient Active Problem List   Diagnosis Date Noted  . Closed fracture of right ankle   . Cellulitis of right leg 05/03/2017  . Hypokalemia 05/03/2017  . Hypertension 05/03/2017  . GERD (gastroesophageal reflux disease) 05/03/2017  . Anxiety 05/03/2017  . S/P total knee replacement using cement, right 04/11/2017  . Status post total left knee replacement 09/26/2016  . Presence of left artificial knee joint 07/25/2016  . Pain and swelling of left lower leg 07/25/2016  . Bilateral primary osteoarthritis of knee 07/19/2016    Class: Chronic  . Unilateral primary osteoarthritis, right knee 07/19/2016  . 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 Hypotestosteronemia 09/08/2015  . Cervical spondylosis without myelopathy 03/25/2014    Class: Chronic  . Left cervical radiculopathy 03/25/2014    Class: Chronic  . Cervical spondylosis with radiculopathy 03/25/2014    Magdaline Zollars 06/02/2017, 3:33 PM  [place], [place], [address] Phone: [phone]   Fax:  [phone]  Name: Jesus Brown MRN: [id number] Date of Birth: [birth year]   Raeford Razor, PT 06/02/17 3:33 PM Phone: [phone] Fax: [phone]

## 2017-06-04 ENCOUNTER — Ambulatory Visit: Payer: BLUE CROSS/BLUE SHIELD | Admitting: Physical Therapy

## 2017-06-04 ENCOUNTER — Encounter: Payer: Self-pay | Admitting: Physical Therapy

## 2017-06-04 DIAGNOSIS — M25512 Pain in left shoulder: Secondary | ICD-10-CM

## 2017-06-04 DIAGNOSIS — M6281 Muscle weakness (generalized): Secondary | ICD-10-CM

## 2017-06-04 DIAGNOSIS — R262 Difficulty in walking, not elsewhere classified: Secondary | ICD-10-CM

## 2017-06-04 DIAGNOSIS — M25572 Pain in left ankle and joints of left foot: Secondary | ICD-10-CM

## 2017-06-04 DIAGNOSIS — R600 Localized edema: Secondary | ICD-10-CM

## 2017-06-04 DIAGNOSIS — M25612 Stiffness of left shoulder, not elsewhere classified: Secondary | ICD-10-CM

## 2017-06-04 NOTE — Patient Instructions (Addendum)
Internal Rotation (Isometric)    With elbow bent and held at side, use other hand to apply resistance to inward motion of arm. Hold _5___ seconds. Repeat _5-  10___ times. Do _1-2___ sessions per day.  Copyright  VHI. All rights reserved.  External Rotation (Isometric)    With elbow bent and held at side, use other hand to apply resistance to outward motion of arm. Hold __5__ seconds. Repeat _5-10___ times. Do __1-2__ sessions per day.  Copyright  VHI. All rights reserved.  Extension (Isometric)    Press elbow into the padded back of seat. Hold _5___ seconds. Relax. Repeat __5-10__ times. Do _1-2___ sessions per day.  Copyright  VHI. All rights reserved.  Abduction (Isometric)    Resist upward motion to the side with other hand on upper arm. Hold 5____ seconds. Relax. Repeat __1-2__ times. Do _1-2___ sessions per day. Activity: Push arm out to side against padded furniture.*  Copyright  VHI. All rights reserved.  Also from ex drawer Issued 1-2 x a day 3 x 30 seconds Shoulder ER stretch single arm only

## 2017-06-04 NOTE — Therapy (Signed)
[place], [place], [address] Phone: [phone]   Fax:  [phone]  Physical Therapy Treatment  Patient Details  Name: Jesus Brown MRN: [id number] Date of Birth: Jun 13, 1965 Referring Provider: Dr. Corinna Capra   Encounter Date: 06/04/2017  PT End of Session - 06/04/17 1549    Visit Number  2    Number of Visits  12    Date for PT Re-Evaluation  07/14/17    PT Start Time  1502    PT Stop Time  1546    PT Time Calculation (min)  44 min    Activity Tolerance  Patient tolerated treatment well    Behavior During Therapy  [facility] for tasks assessed/performed       Past Medical History:  Diagnosis Date  . Anxiety    mainly r/t pain  . Arthritis    neck, knees  . Asthma    AS CHILD   . Back pain, chronic   . DDD (degenerative disc disease)   . Dysphagia   . 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 GERD (gastroesophageal reflux disease)   . Hypertension   . Hypotestosteronemia 09/08/2015  . Neck pain, chronic     Past Surgical History:  Procedure Laterality Date  . ANKLE SURGERY    . BACK SURGERY  2010,2011   lumb fusionx2  . CERVICAL FUSION  8299,3716   3 surgeries  . DIRECT LARYNGOSCOPY N/A 11/09/2012   Procedure: DIRECT LARYNGOSCOPY;  Surgeon: Jodi Marble, MD;  Location: [place];  Service: ENT;  Laterality: N/A;  . ESOPHAGOGASTRODUODENOSCOPY N/A 08/03/2012   Procedure: ESOPHAGOGASTRODUODENOSCOPY (EGD);  Surgeon: Arta Silence, MD;  Location[facility] END[facility]COPY;  Service: Endoscopy;  Laterality: N/A;  . ESOPHAGOSCOPY W/ BOTOX INJECTION N/A 11/09/2012   Procedure: ESOPHAGOSCOPY POSSIBLE BIOPSY AND POSSIBLE BOTOX INJECTION CRICOPHARYNGEUS;  Surgeon: Jodi Marble, MD;  Location: [place];  Service: ENT;  Laterality: N/A;  Esophagoscopy with botox injection  . HERNIA REPAIR     ing-rt  . KNEE SURGERY     rt and left x2 each=4  . POSTERIOR CERVICAL  FUSION/FORAMINOTOMY N/A 03/25/2014   Procedure: LEFT C7-T1 FORAMINOTOMY;  Surgeon: Jessy Oto, MD;  Location: [place];  Service: Orthopedics;  Laterality: N/A;  . [place]   rt and lt  . TONSILLECTOMY    . TOTAL KNEE ARTHROPLASTY Left 07/19/2016   Procedure: LEFT TOTAL KNEE ARTHROPLASTY CORTISON INJECTION IN RIGHT KNEE;  Surgeon: Jessy Oto, MD;  Location: [place];  Service: Orthopedics;  Laterality: Left;  MAY NEED RNFA TO FINISH CASE PER [place]   . TOTAL KNEE ARTHROPLASTY Right 04/11/2017   Procedure: RIGHT TOTAL KNEE ARTHROPLASTY;  Surgeon: Jessy Oto, MD;  Location: [place];  Service: Orthopedics;  Laterality: Right;  . WRIST SURGERY     lt and rt    There were no vitals filed for this visit.  Subjective Assessment - 06/04/17 1502    Subjective  Doing OK.    Currently in Pain?  Yes    Pain Score  5     Pain Location  Shoulder    Pain Orientation  Left    Pain Descriptors / Indicators  Aching;Stabbing locks up     Aggravating Factors   reaching up and out, putting sleeve in to shirt.  sleeping on left shoulder    Pain Relieving Factors  resting    Effect of Pain on Daily Activities  linits reaching,   Hard to fall asleep    Pain Score  2    Pain Location  Heel    Pain Orientation  Left    Pain Descriptors / Indicators  Sore    Pain Frequency  Intermittent    Aggravating Factors   first getting  up in am.      Pain Relieving Factors  stretches on steps                      [facility] Adult PT Treatment/Exercise - 06/04/17 0001      Self-Care   Self-Care  -- what a bursae is,how to decrease inflamationrole of posture             PT Education - 06/04/17 1549    Education provided  Yes    Education Details  HEP,  posture ed    Person(s) Educated  Patient    Methods  Demonstration;Explanation;Tactile cues;Verbal cues;Handout    Comprehension  Verbalized understanding;Returned demonstration       PT Short Term Goals - 05/14/17 1249       PT SHORT TERM GOAL #1   Title  Pt will be I with initial HEP for ROM and strength in L. knee     Baseline  cues needed for technique,  minor    Time  4    Status  On-going      PT SHORT TERM GOAL #2   Title  Pt will be able to go without compression stocking for up to 3 hours and manage edema     Baseline  weaning from compression stocking    Time  4    Period  Weeks    Status  On-going      PT SHORT TERM GOAL #3   Title  Pt will achieve no more than 10 deg knee extension for improved gait, flexibility.     Baseline  22    Time  4    Period  Weeks    Status  On-going        PT Long Term Goals - 06/02/17 1519      PT LONG TERM GOAL #6   Title  Pt will be able to complete ADLs, reaching in all directions without increase in shoulder pain (donning/doffing shirt)     Time  6    Period  Weeks    Status  New    Target Date  07/14/17      PT LONG TERM GOAL #7   Title  Pt will demo in LUE to 4+/5 in flexion, abduction for maximal functional use of L UE     Time  6    Period  Weeks    Status  New    Target Date  07/14/17            Plan - 06/04/17 1550    Clinical Impression Statement  Patient was able to demo good technique with New exercises added to HEP.  Sitting posture improved with cues.     PT Next Visit Plan  FOTO FOR KNEE (  Forgot to do today) ionto L heel, [place], stretch, eccentric heel raise off step , cont knee ROM, extension .  quad stretch with patella distal glides. //// Shoulder: AROM to tolerance, scapular strength, patch, [place] to L ant shoulder. try tape when not using ionto,  review isometrics     PT Home Exercise Plan  hamstring stretch, quad set, LAQ (  has full HEP from previous episode but told to focus on knee ext ) , wall slide for quads //////shoulder ER red band and flexion with wand, standing  Shoulder doorway stretch,  shoulder isometrics    Consulted and Agree with Plan of Care  Patient       Patient will benefit from skilled therapeutic  intervention in order to improve the following deficits and impairments:     Visit Diagnosis: Acute pain of left shoulder  Stiffness of left shoulder, not elsewhere classified  Muscle weakness (generalized)  Localized edema  Difficulty in walking, not elsewhere classified  Pain in left ankle and joints of left foot     Problem List Patient Active Problem List   Diagnosis Date Noted  . Closed fracture of right ankle   . Cellulitis of right leg 05/03/2017  . Hypokalemia 05/03/2017  . Hypertension 05/03/2017  . GERD (gastroesophageal reflux disease) 05/03/2017  . Anxiety 05/03/2017  . S/P total knee replacement using cement, right 04/11/2017  . Status post total left knee replacement 09/26/2016  . Presence of left artificial knee joint 07/25/2016  . Pain and swelling of left lower leg 07/25/2016  . Bilateral primary osteoarthritis of knee 07/19/2016    Class: Chronic  . Unilateral primary osteoarthritis, right knee 07/19/2016  . 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 Hypotestosteronemia 09/08/2015  . Cervical spondylosis without myelopathy 03/25/2014    Class: Chronic  . Left cervical radiculopathy 03/25/2014    Class: Chronic  . Cervical spondylosis with radiculopathy 03/25/2014    HARRIS,KAREN PTA 06/04/2017, 3:55 PM  [facility] [address] [place], [place], [address] Phone: [phone]   Fax:  [phone]  Name: SEVIN LANGENBACH MRN: [id number] Date of Birth: [birth year]

## 2017-06-05 ENCOUNTER — Encounter: Payer: BLUE CROSS/BLUE SHIELD | Admitting: Physical Therapy

## 2017-06-09 ENCOUNTER — Encounter: Payer: Self-pay | Admitting: Physical Therapy

## 2017-06-09 ENCOUNTER — Ambulatory Visit: Payer: BLUE CROSS/BLUE SHIELD | Attending: Specialist | Admitting: Physical Therapy

## 2017-06-09 DIAGNOSIS — M25612 Stiffness of left shoulder, not elsewhere classified: Secondary | ICD-10-CM | POA: Diagnosis present

## 2017-06-09 DIAGNOSIS — M25561 Pain in right knee: Secondary | ICD-10-CM

## 2017-06-09 DIAGNOSIS — M25572 Pain in left ankle and joints of left foot: Secondary | ICD-10-CM | POA: Insufficient documentation

## 2017-06-09 DIAGNOSIS — R600 Localized edema: Secondary | ICD-10-CM | POA: Diagnosis present

## 2017-06-09 DIAGNOSIS — M6281 Muscle weakness (generalized): Secondary | ICD-10-CM | POA: Diagnosis present

## 2017-06-09 DIAGNOSIS — R262 Difficulty in walking, not elsewhere classified: Secondary | ICD-10-CM | POA: Insufficient documentation

## 2017-06-09 NOTE — Therapy (Signed)
[place], [place], [address] Phone: [phone]   Fax:  [phone]  Physical Therapy Treatment  Patient Details  Name: Jesus Brown MRN: [id number] Date of Birth: [birth year] Referring Provider: Dr. Corinna Capra, Dr. Louanne Skye   Encounter Date: 06/09/2017  PT End of Session - 06/09/17 1249    Visit Number  8 KNEE    Number of Visits  24    Date for PT Re-Evaluation  06/23/17    PT Start Time  1015    PT Stop Time  1105    PT Time Calculation (min)  50 min    Activity Tolerance  Patient tolerated treatment well    Behavior During Therapy  [facility] for tasks assessed/performed       Past Medical History:  Diagnosis Date  . Anxiety    mainly r/t pain  . Arthritis    neck, knees  . Asthma    AS CHILD   . Back pain, chronic   . DDD (degenerative disc disease)   . Dysphagia   . 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 GERD (gastroesophageal reflux disease)   . Hypertension   . Hypotestosteronemia 09/08/2015  . Neck pain, chronic     Past Surgical History:  Procedure Laterality Date  . ANKLE SURGERY    . BACK SURGERY  2010,2011   lumb fusionx2  . CERVICAL FUSION  1025,8527   3 surgeries  . DIRECT LARYNGOSCOPY N/A 11/09/2012   Procedure: DIRECT LARYNGOSCOPY;  Surgeon: Jodi Marble, MD;  Location: [place];  Service: ENT;  Laterality: N/A;  . ESOPHAGOGASTRODUODENOSCOPY N/A 08/03/2012   Procedure: ESOPHAGOGASTRODUODENOSCOPY (EGD);  Surgeon: Arta Silence, MD;  Location[facility] ENDOSCOPY;  Service: Endoscopy;  Laterality: N/A;  . ESOPHAGOSCOPY W/ BOTOX INJECTION N/A 11/09/2012   Procedure: ESOPHAGOSCOPY POSSIBLE BIOPSY AND POSSIBLE BOTOX INJECTION CRICOPHARYNGEUS;  Surgeon: Jodi Marble, MD;  Location: [place];  Service: ENT;  Laterality: N/A;  Esophagoscopy with botox injection  . HERNIA REPAIR     ing-rt  . KNEE SURGERY     rt and left x2 each=4  . POSTERIOR  CERVICAL FUSION/FORAMINOTOMY N/A 03/25/2014   Procedure: LEFT C7-T1 FORAMINOTOMY;  Surgeon: Jessy Oto, MD;  Location: [place];  Service: Orthopedics;  Laterality: N/A;  . [place]   rt and lt  . TONSILLECTOMY    . TOTAL KNEE ARTHROPLASTY Left 07/19/2016   Procedure: LEFT TOTAL KNEE ARTHROPLASTY CORTISON INJECTION IN RIGHT KNEE;  Surgeon: Jessy Oto, MD;  Location: [place];  Service: Orthopedics;  Laterality: Left;  MAY NEED RNFA TO FINISH CASE PER [place]   . TOTAL KNEE ARTHROPLASTY Right 04/11/2017   Procedure: RIGHT TOTAL KNEE ARTHROPLASTY;  Surgeon: Jessy Oto, MD;  Location: [place];  Service: Orthopedics;  Laterality: Right;  . WRIST SURGERY     lt and rt    There were no vitals filed for this visit.  Subjective Assessment - 06/09/17 1048    Subjective  My JAs was approved.  Its being made. Knee is uncomfortable but not hurting alot. R achilles feeling good     Currently in Pain?  Yes    Pain Score  1     Pain Location  Knee    Pain Orientation  Right    Pain Descriptors / Indicators  Discomfort    Pain Type  Chronic pain    Pain Onset  More than a month ago  Pain Frequency  Intermittent    Aggravating Factors   walking too long     Pain Relieving Factors  ice, rest          [facility] PT Assessment - 06/09/17 0001      Observation/Other Assessments   Focus on Therapeutic Outcomes (FOTO)   61%                  [facility] Adult PT Treatment/Exercise - 06/09/17 0001      Knee/Hip Exercises: Stretches   Active Hamstring Stretch  Right;3 reps;30 seconds contract relax x 5     ITB Stretch  Right;3 reps;30 seconds      Knee/Hip Exercises: Aerobic   Stationary Bike  6 min L3      Knee/Hip Exercises: Machines for Strengthening   Cybex Knee Extension  10 lbs x 20, 10 lbs x 10 eccentric and x 10 concentric      Manual Therapy   Joint Mobilization  patellar, extension GRIII     Soft tissue mobilization  IASTM, distal quads, 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 knee     Status  Achieved      PT SHORT TERM GOAL #2   Title  Pt will be able to go without compression stocking for up to 3 hours and manage edema     Baseline  weaning from compression stocking    Status  Partially Met      PT SHORT TERM GOAL #3   Title  Pt will achieve no more than 10 deg knee extension for improved gait, flexibility.     Status  On-going        PT Long Term Goals - 06/09/17 1251      PT LONG TERM GOAL #1   Title  Pt will understand RICE, posture and body mechanics as it pertains to knee and back pain.     Status  On-going      PT LONG TERM GOAL #2   Title  Pt will be I with more advanced HEP for knee, core.     Status  On-going      PT LONG TERM GOAL #3   Title  Pt will less than 60% limited on FOTO to demo functional improvement    Baseline  61%    Status  Partially Met      PT LONG TERM GOAL #4   Title  Pt will be able to go outside and participate in family functions without limitations of knee pain, including walking on uneven ground.     Status  On-going      PT LONG TERM GOAL #5   Title  Pt will be able to walk for 30 min for general health and fitness without lasting knee pain     Status  On-going      PT LONG TERM GOAL #6   Title  Pt will be able to complete ADLs, reaching in all directions without increase in shoulder pain (donning/doffing shirt)     Status  Unable to assess      PT LONG TERM GOAL #7  Title  Pt will demo in LUE to 4+/5 in flexion, abduction for maximal functional use of L UE     Status  Unable to assess            Plan - 06/09/17 1251    Clinical Impression Statement  Worked on soft tissue mobility for Rt knee ROM.  Pt felt good post session.  Still lacking a significant amt of knee ext.   Hopeful that JAS 3 times per day will improve this.     PT Treatment/Interventions  ADLs/Self Care Home Management;Cryotherapy;Moist Heat;Electrical Stimulation;Therapeutic activities;Therapeutic exercise;Balance training;Patient/family education;Manual techniques;Passive range of motion;Neuromuscular re-education;Taping;Vasopneumatic Device;Functional mobility training;Gait training;Stair training;DME Instruction;Iontophoresis 43m/ml Dexamethasone;Ultrasound    PT Next Visit Plan  ionto L heel, [place] stretch, eccentric heel raise off step , cont knee ROM, extension .  quad stretch with patella distal glides. //// Shoulder: AROM to tolerance, scapular strength, patch, [place] L ant shoulder. try tape when not using ionto,  review isometrics     PT Home Exercise Plan  hamstring stretch, quad set, LAQ (has full HEP from previous episode but told to focus on knee ext ) , wall slide for quads //////shoulder ER red band and flexion with wand, standing  Shoulder doorway stretch,  shoulder isometrics    Consulted and Agree with Plan of Care  Patient       Patient will benefit from skilled therapeutic intervention in order to improve the following deficits and impairments:  Abnormal gait, Decreased mobility, Difficulty walking, Impaired sensation, Improper body mechanics, Decreased scar mobility, Decreased range of motion, Decreased activity tolerance, Decreased strength, Increased fascial restricitons, Impaired flexibility, Pain, Increased edema, Impaired UE functional use  Visit Diagnosis: Muscle weakness (generalized)  Localized edema  Difficulty in walking, not elsewhere classified  Acute pain of right knee  Pain in left ankle and joints of left foot     Problem List Patient Active Problem List   Diagnosis Date Noted  . Closed fracture of right ankle   . Cellulitis of right leg 05/03/2017  . Hypokalemia 05/03/2017  . Hypertension 05/03/2017  . GERD (gastroesophageal reflux disease) 05/03/2017   . Anxiety 05/03/2017  . S/P total knee replacement using cement, right 04/11/2017  . Status post total left knee replacement 09/26/2016  . Presence of left artificial knee joint 07/25/2016  . Pain and swelling of left lower leg 07/25/2016  . Bilateral primary osteoarthritis of knee 07/19/2016    Class: Chronic  . Unilateral primary osteoarthritis, right knee 07/19/2016  . 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 Hypotestosteronemia 09/08/2015  . Cervical spondylosis without myelopathy 03/25/2014    Class: Chronic  . Left cervical radiculopathy 03/25/2014    Class: Chronic  . Cervical spondylosis with radiculopathy 03/25/2014    Maytal Mijangos 06/09/2017, 12:53 PM  [place] [address]: [phone]  Fax:  [phone] Name: JTERRE ZABRISKIEMRN: [id number] of Birth: 601/29/1967 JRaeford Razor PT 06/09/17 12:54 PM Phone: [phone]: [phone]

## 2017-06-11 ENCOUNTER — Ambulatory Visit: Payer: BLUE CROSS/BLUE SHIELD | Admitting: Physical Therapy

## 2017-06-11 DIAGNOSIS — M25612 Stiffness of left shoulder, not elsewhere classified: Secondary | ICD-10-CM

## 2017-06-11 DIAGNOSIS — M6281 Muscle weakness (generalized): Secondary | ICD-10-CM | POA: Diagnosis not present

## 2017-06-11 NOTE — Therapy (Signed)
[place], [place], [address] Phone: [phone]   Fax:  [phone]  Physical Therapy Treatment  Patient Details  Name: Jesus Brown MRN: [id number] Date of Birth: [birth year] Referring Provider: Dr. Corinna Capra   Encounter Date: 06/11/2017  PT End of Session - 06/11/17 1036    Visit Number  3 shoulder    Number of Visits  12    PT Start Time  9983    PT Stop Time  1102    PT Time Calculation (min)  47 min    Activity Tolerance  Patient tolerated treatment well    Behavior During Therapy  [facility] for tasks assessed/performed       Past Medical History:  Diagnosis Date  . Anxiety    mainly r/t pain  . Arthritis    neck, knees  . Asthma    AS CHILD   . Back pain, chronic   . DDD (degenerative disc disease)   . Dysphagia   . 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 GERD (gastroesophageal reflux disease)   . Hypertension   . Hypotestosteronemia 09/08/2015  . Neck pain, chronic     Past Surgical History:  Procedure Laterality Date  . ANKLE SURGERY    . BACK SURGERY  2010,2011   lumb fusionx2  . CERVICAL FUSION  3825,0539   3 surgeries  . DIRECT LARYNGOSCOPY N/A 11/09/2012   Procedure: DIRECT LARYNGOSCOPY;  Surgeon: Jodi Marble, MD;  Location: [place];  Service: ENT;  Laterality: N/A;  . ESOPHAGOGASTRODUODENOSCOPY N/A 08/03/2012   Procedure: ESOPHAGOGASTRODUODENOSCOPY (EGD);  Surgeon: Arta Silence, MD;  Location: [facility] ENDOSCOPY;  Service: Endoscopy;  Laterality: N/A;  . ESOPHAGOSCOPY W/ BOTOX INJECTION N/A 11/09/2012   Procedure: ESOPHAGOSCOPY POSSIBLE BIOPSY AND POSSIBLE BOTOX INJECTION CRICOPHARYNGEUS;  Surgeon: Jodi Marble, MD;  Location: [place];  Service: ENT;  Laterality: N/A;  Esophagoscopy with botox injection  . HERNIA REPAIR     ing-rt  . KNEE SURGERY     rt and left x2 each=4  . POSTERIOR CERVICAL FUSION/FORAMINOTOMY N/A 03/25/2014   Procedure: LEFT C7-T1 FORAMINOTOMY;  Surgeon: Jessy Oto, MD;  Location: [place];  Service: Orthopedics;  Laterality: N/A;  . [place]   rt and lt  . TONSILLECTOMY    . TOTAL KNEE ARTHROPLASTY Left 07/19/2016   Procedure: LEFT TOTAL KNEE ARTHROPLASTY CORTISON INJECTION IN RIGHT KNEE;  Surgeon: Jessy Oto, MD;  Location: [place];  Service: Orthopedics;  Laterality: Left;  MAY NEED RNFA TO FINISH CASE PER [place]   . TOTAL KNEE ARTHROPLASTY Right 04/11/2017   Procedure: RIGHT TOTAL KNEE ARTHROPLASTY;  Surgeon: Jessy Oto, MD;  Location: [place];  Service: Orthopedics;  Laterality: Right;  . WRIST SURGERY     lt and rt    There were no vitals filed for this visit.  Subjective Assessment - 06/11/17 1018    Subjective  Has a low grade ache in L shoulder.  Sharp, catching when I reach up and out ot put my shirt on.     Currently in Pain?  Yes    Pain Score  2     Pain Location  Shoulder    Pain Orientation  Left    Pain Descriptors / Indicators  Aching    Pain Type  Chronic pain    Pain Onset  More than a month ago    Pain Frequency  Intermittent    Aggravating  Factors   reaching up, out           [facility] Adult PT Treatment/Exercise - 06/11/17 0001      Knee/Hip Exercises: Aerobic   Stationary Bike  6 min L3      Shoulder Exercises: Supine   External Rotation  AAROM;Left;10 reps    Flexion  AAROM;Both;10 reps    Other Supine Exercises  cane exercises    Other Supine Exercises  supine scapular stab. , given for HEP , ER, sash, narrow grip, horiz pull red band x 10       Shoulder Exercises: ROM/Strengthening   Other ROM/Strengthening Exercises  UE Ranger for flexion, abduction, scaption in standing with weight shift      Ultrasound   Ultrasound Location  Lt ant superior aspect of shoulder    Ultrasound Parameters  50% , 1.0 W/cm2, 1 MHz    Ultrasound Goals  Pain      Iontophoresis   Type of Iontophoresis  Dexamethasone    Location  L ant sup shoulder     Dose   1 cc     Time  6 hour patch       Manual Therapy   Manual Therapy  Passive ROM    Passive ROM  L UE all planes             PT Education - 06/11/17 1036    Education provided  Yes    Education Details  impingment , HEP for scap stab    Person(s) Educated  Patient    Methods  Explanation;Verbal cues;Handout    Comprehension  Verbalized understanding;Returned demonstration       PT Short Term Goals - 06/09/17 1250      PT SHORT TERM GOAL #1   Title  Pt will be I with initial HEP for ROM and strength in L. knee     Status  Achieved      PT SHORT TERM GOAL #2   Title  Pt will be able to go without compression stocking for up to 3 hours and manage edema     Baseline  weaning from compression stocking    Status  Partially Met      PT SHORT TERM GOAL #3   Title  Pt will achieve no more than 10 deg knee extension for improved gait, flexibility.     Status  On-going        PT Long Term Goals - 06/09/17 1251      PT LONG TERM GOAL #1   Title  Pt will understand RICE, posture and body mechanics as it pertains to knee and back pain.     Status  On-going      PT LONG TERM GOAL #2   Title  Pt will be I with more advanced HEP for knee, core.     Status  On-going      PT LONG TERM GOAL #3   Title  Pt will less than 60% limited on FOTO to demo functional improvement    Baseline  61%    Status  Partially Met      PT LONG TERM GOAL #4   Title  Pt will be able to go outside and participate in family functions without limitations of knee pain, including walking on uneven ground.     Status  On-going      PT LONG TERM GOAL #5   Title  Pt will be able to walk for 30 min for  general health and fitness without lasting knee pain     Status  On-going      PT LONG TERM GOAL #6   Title  Pt will be able to complete ADLs, reaching in all directions without increase in shoulder pain (donning/doffing shirt)     Status  Unable to assess      PT LONG TERM GOAL #7   Title  Pt will  demo in LUE to 4+/5 in flexion, abduction for maximal functional use of L UE     Status  Unable to assess            Plan - 06/11/17 1458    Clinical Impression Statement  Addressed L shoulder pain.  He has pain with scapular plane motions. He has good AROM but small painful arc.  Focused on scapular stability to reduce impingment signs.      PT Next Visit Plan  ionto L heel, [place], stretch, eccentric heel raise off step , cont knee ROM, extension .  quad stretch with patella distal glides. //// Shoulder: AROM to tolerance, scapular strength, patch, [place] to L ant shoulder. try tape when not using ionto,  review isometrics     PT Home Exercise Plan  hamstring stretch, quad set, LAQ (has full HEP from previous episode but told to focus on knee ext ) , wall slide for quads //////supine scapular stability with red band shoulder ER red band and flexion with wand, standing  Shoulder doorway stretch,  shoulder isometrics    Consulted and Agree with Plan of Care  Patient       Patient will benefit from skilled therapeutic intervention in order to improve the following deficits and impairments:  Abnormal gait, Decreased mobility, Difficulty walking, Impaired sensation, Improper body mechanics, Decreased scar mobility, Decreased range of motion, Decreased activity tolerance, Decreased strength, Increased fascial restricitons, Impaired flexibility, Pain, Increased edema, Impaired UE functional use  Visit Diagnosis: Muscle weakness (generalized)  Stiffness of left shoulder, not elsewhere classified     Problem List Patient Active Problem List   Diagnosis Date Noted  . Closed fracture of right ankle   . Cellulitis of right leg 05/03/2017  . Hypokalemia 05/03/2017  . Hypertension 05/03/2017  . GERD (gastroesophageal reflux disease) 05/03/2017  . Anxiety 05/03/2017  . S/P total knee replacement using cement, right 04/11/2017  . Status post total left knee replacement 09/26/2016  . Presence of left  artificial knee joint 07/25/2016  . Pain and swelling of left lower leg 07/25/2016  . Bilateral primary osteoarthritis of knee 07/19/2016    Class: Chronic  . Unilateral primary osteoarthritis, right knee 07/19/2016  . 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 Hypotestosteronemia 09/08/2015  . Cervical spondylosis without myelopathy 03/25/2014    Class: Chronic  . Left cervical radiculopathy 03/25/2014    Class: Chronic  . Cervical spondylosis with radiculopathy 03/25/2014    Janiqua Friscia 06/11/2017, 3:03 PM  [facility] [address] [place], [place], [address] Phone: [phone]   Fax:  [phone]  Name: Jesus Brown MRN: [id number] Date of Birth: 1965-05-28  Raeford Razor, PT 06/11/17 3:03 PM Phone: [phone] Fax: [phone]

## 2017-06-11 NOTE — Patient Instructions (Signed)
Over Head Pull: Narrow Grip       On back, knees bent, feet flat, band across thighs, elbows straight but relaxed. Pull hands apart (start). Keeping elbows straight, bring arms up and over head, hands toward floor. Keep pull steady on band. Hold momentarily. Return slowly, keeping pull steady, back to start. Repeat ___ times. Band color ______   Side Pull: Double Arm   On back, knees bent, feet flat. Arms perpendicular to body, shoulder level, elbows straight but relaxed. Pull arms out to sides, elbows straight. Resistance band comes across collarbones, hands toward floor. Hold momentarily. Slowly return to starting position. Repeat ___ times. Band color _____   Sash   On back, knees bent, feet flat, left hand on left hip, right hand above left. Pull right arm DIAGONALLY (hip to shoulder) across chest. Bring right arm along head toward floor. Hold momentarily. Slowly return to starting position. Repeat ___ times. Do with left arm. Band color ______   Shoulder Rotation: Double Arm   On back, knees bent, feet flat, elbows tucked at sides, bent 90, hands palms up. Pull hands apart and down toward floor, keeping elbows near sides. Hold momentarily. Slowly return to starting position. Repeat ___ times. Band color ______

## 2017-06-12 ENCOUNTER — Ambulatory Visit (INDEPENDENT_AMBULATORY_CARE_PROVIDER_SITE_OTHER): Payer: BLUE CROSS/BLUE SHIELD

## 2017-06-12 ENCOUNTER — Encounter (INDEPENDENT_AMBULATORY_CARE_PROVIDER_SITE_OTHER): Payer: Self-pay | Admitting: Specialist

## 2017-06-12 ENCOUNTER — Ambulatory Visit: Payer: BLUE CROSS/BLUE SHIELD | Admitting: Physical Therapy

## 2017-06-12 ENCOUNTER — Encounter: Payer: Self-pay | Admitting: Physical Therapy

## 2017-06-12 ENCOUNTER — Ambulatory Visit (INDEPENDENT_AMBULATORY_CARE_PROVIDER_SITE_OTHER): Payer: BLUE CROSS/BLUE SHIELD | Admitting: Specialist

## 2017-06-12 VITALS — BP 114/79 | HR 87 | Ht 68.0 in | Wt 230.0 lb

## 2017-06-12 DIAGNOSIS — M25562 Pain in left knee: Secondary | ICD-10-CM

## 2017-06-12 DIAGNOSIS — M25561 Pain in right knee: Secondary | ICD-10-CM

## 2017-06-12 DIAGNOSIS — M6281 Muscle weakness (generalized): Secondary | ICD-10-CM | POA: Diagnosis not present

## 2017-06-12 DIAGNOSIS — R262 Difficulty in walking, not elsewhere classified: Secondary | ICD-10-CM

## 2017-06-12 DIAGNOSIS — R600 Localized edema: Secondary | ICD-10-CM

## 2017-06-12 DIAGNOSIS — Z96651 Presence of right artificial knee joint: Secondary | ICD-10-CM

## 2017-06-12 NOTE — Progress Notes (Signed)
Post-Op Visit Note   Patient: Jesus Brown           Date of Birth: 06/24/1965           MRN: [id number] Visit Date: 06/12/2017 PCP: Merrilee Seashore, MD   Assessment & Plan: right knee replacement with decreased flexion and mild arthrofibrosis, Left knee with distal medial femoral pain with weight bearing r/o nondisplaced fracture or stress fracture due to fall.  Chief Complaint:  Chief Complaint  Patient presents with  . Right Knee - Routine Post Op   Visit Diagnoses:  1. Left knee pain, unspecified chronicity   2. Total knee replacement status, right    Left knee with tenderness along the mdial distal femur above the TKR.  Right TKR with incision is healed and motor normal.  Plan: Use crutches, 1/2 or 50 weight bearing on the left leg until the report of the CT scan is available. Okay for use of the turnbuckle right knee joint dynamic brace to regain range of motion.  Has adequate pain meds.  CT scan ordered to assess the left distal femur for nondisplaced fracture of the left distal femur.   Follow-Up Instructions: Return in about 4 weeks (around 07/10/2017).   Orders:  Orders Placed This Encounter  Procedures  . XR Knee 1-2 Views Left  . CT KNEE LEFT WO CONTRAST   No orders of the defined types were placed in this encounter.   Imaging: Xr Knee 1-2 Views Left  Result Date: 06/12/2017 Oblique linear lucency with some sclerosis that is metaphyseal and extends posterior distal to proximal anterior.    PMFS History: Patient Active Problem List   Diagnosis Date Noted  . Bilateral primary osteoarthritis of knee 07/19/2016    Priority: High    Class: Chronic  . Cervical spondylosis without myelopathy 03/25/2014    Priority: High    Class: Chronic  . Left cervical radiculopathy 03/25/2014    Priority: High    Class: Chronic  . Closed fracture of right ankle   . Cellulitis of right leg 05/03/2017  . Hypokalemia 05/03/2017  . Hypertension 05/03/2017  . GERD  (gastroesophageal reflux disease) 05/03/2017  . Anxiety 05/03/2017  . S/P total knee replacement using cement, right 04/11/2017  . Status post total left knee replacement 09/26/2016  . Presence of left artificial knee joint 07/25/2016  . Pain and swelling of left lower leg 07/25/2016  . Unilateral primary osteoarthritis, right knee 07/19/2016  . 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 Hypotestosteronemia 09/08/2015  . Cervical spondylosis with radiculopathy 03/25/2014   Past Medical History:  Diagnosis Date  . Anxiety    mainly r/t pain  . Arthritis    neck, knees  . Asthma    AS CHILD   . Back pain, chronic   . DDD (degenerative disc disease)   . Dysphagia   . 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 GERD (gastroesophageal reflux disease)   . Hypertension   . Hypotestosteronemia 09/08/2015  . Neck pain, chronic     Family History  Problem Relation Age of Onset  . Hypertension Mother   . Heart attack Father     Past Surgical History:  Procedure Laterality Date  . ANKLE SURGERY    . BACK SURGERY  2010,2011   lumb fusionx2  . CERVICAL FUSION  3810,1751   3 surgeries  . DIRECT LARYNGOSCOPY N/A 11/09/2012   Procedure: DIRECT LARYNGOSCOPY;  Surgeon: Jodi Marble, MD;  Location: [place];  Service: ENT;  Laterality: N/A;  . ESOPHAGOGASTRODUODENOSCOPY N/A 08/03/2012   Procedure: ESOPHAGOGASTRODUODENOSCOPY (EGD);  Surgeon: Arta Silence, MD;  [facility]COPY;  Service: Endoscopy;  Laterality: N/A;  . ESOPHAGOSCOPY W/ BOTOX INJECTION N/A 11/09/2012   Procedure: ESOPHAGOSCOPY POSSIBLE BIOPSY AND POSSIBLE BOTOX INJECTION CRICOPHARYNGEUS;  Surgeon: Jodi Marble, MD;  Location: [place];  Service: ENT;  Laterality: N/A;  Esophagoscopy with botox injection  . HERNIA REPAIR     ing-rt  . KNEE SURGERY     rt and left x2 each=4  . POSTERIOR CERVICAL FUSION/FORAMINOTOMY N/A 03/25/2014   Procedure: LEFT C7-T1  FORAMINOTOMY;  Surgeon: Jessy Oto, MD;  Location: [place];  Service: Orthopedics;  Laterality: N/A;  . [place]   rt and lt  . TONSILLECTOMY    . TOTAL KNEE ARTHROPLASTY Left 07/19/2016   Procedure: LEFT TOTAL KNEE ARTHROPLASTY CORTISON INJECTION IN RIGHT KNEE;  Surgeon: Jessy Oto, MD;  Location: [place];  Service: Orthopedics;  Laterality: Left;  MAY NEED RNFA TO FINISH CASE PER [place]   . TOTAL KNEE ARTHROPLASTY Right 04/11/2017   Procedure: RIGHT TOTAL KNEE ARTHROPLASTY;  Surgeon: Jessy Oto, MD;  Location: [place];  Service: Orthopedics;  Laterality: Right;  . WRIST SURGERY     lt and rt   Social History   Occupational History  . Not on file  Tobacco Use  . Smoking status: Never Smoker  . Smokeless tobacco: Never Used  Substance and Sexual Activity  . Alcohol use: No  . Drug use: No  . Sexual activity: Not on file

## 2017-06-12 NOTE — Patient Instructions (Addendum)
Use crutches, 1/2 or 50 weight bearing on the left leg until the report of the CT scan is available. Okay for use of the turnbuckle right knee joint dynamic brace to regain range of motion.  Has adequate pain meds.  CT scan ordered to assess the left distal femur for nondisplaced fracture of the left distal femur.

## 2017-06-12 NOTE — Therapy (Signed)
[place], [place], [address] Phone: [phone]   Fax:  [phone]  Physical Therapy Treatment  Patient Details  Name: Jesus Brown MRN: [id number] Date of Birth: 07-28-1965 Referring Provider: Dr. Corinna Capra  Dr. Basil Dess   Encounter Date: 06/12/2017  PT End of Session - 06/12/17 1426    Visit Number  9 KNEE     Number of Visits  19    PT Start Time  1330    PT Stop Time  1415    PT Time Calculation (min)  45 min    Activity Tolerance  Patient tolerated treatment well    Behavior During Therapy  [facility] for tasks assessed/performed       Past Medical History:  Diagnosis Date  . Anxiety    mainly r/t pain  . Arthritis    neck, knees  . Asthma    AS CHILD   . Back pain, chronic   . DDD (degenerative disc disease)   . Dysphagia   . 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 GERD (gastroesophageal reflux disease)   . Hypertension   . Hypotestosteronemia 09/08/2015  . Neck pain, chronic     Past Surgical History:  Procedure Laterality Date  . ANKLE SURGERY    . BACK SURGERY  2010,2011   lumb fusionx2  . CERVICAL FUSION  3559,7416   3 surgeries  . DIRECT LARYNGOSCOPY N/A 11/09/2012   Procedure: DIRECT LARYNGOSCOPY;  Surgeon: Jodi Marble, MD;  Location: [place];  Service: ENT;  Laterality: N/A;  . ESOPHAGOGASTRODUODENOSCOPY N/A 08/03/2012   Procedure: ESOPHAGOGASTRODUODENOSCOPY (EGD);  Surgeon: Arta Silence, MD;  Location: [facility] ENDOSCOPY;  Service: Endoscopy;  Laterality: N/A;  . ESOPHAGOSCOPY W/ BOTOX INJECTION N/A 11/09/2012   Procedure: ESOPHAGOSCOPY POSSIBLE BIOPSY AND POSSIBLE BOTOX INJECTION CRICOPHARYNGEUS;  Surgeon: Jodi Marble, MD;  Location: [place];  Service: ENT;  Laterality: N/A;  Esophagoscopy with botox injection  . HERNIA REPAIR     ing-rt  . KNEE SURGERY     rt and left x2 each=4  . POSTERIOR CERVICAL FUSION/FORAMINOTOMY N/A  03/25/2014   Procedure: LEFT C7-T1 FORAMINOTOMY;  Surgeon: Jessy Oto, MD;  Location: [place];  Service: Orthopedics;  Laterality: N/A;  . [place]   rt and lt  . TONSILLECTOMY    . TOTAL KNEE ARTHROPLASTY Left 07/19/2016   Procedure: LEFT TOTAL KNEE ARTHROPLASTY CORTISON INJECTION IN RIGHT KNEE;  Surgeon: Jessy Oto, MD;  Location: [place];  Service: Orthopedics;  Laterality: Left;  MAY NEED RNFA TO FINISH CASE PER [place]   . TOTAL KNEE ARTHROPLASTY Right 04/11/2017   Procedure: RIGHT TOTAL KNEE ARTHROPLASTY;  Surgeon: Jessy Oto, MD;  Location: [place];  Service: Orthopedics;  Laterality: Right;  . WRIST SURGERY     lt and rt    There were no vitals filed for this visit.  Subjective Assessment - 06/12/17 1355    Subjective  Rt. knee aching.  L knee with weight on it has pain.  Dr. Louanne Skye ordered a CT scan.  I may have a stress fracture in my L knee. Wants 25% WB until CT scan.     Currently in Pain?  Yes    Pain Score  1          [facility] PT Assessment - 06/12/17 0001      PROM   Overall PROM Comments  Rt. knee extension 11 deg with PT  assist          [place] Adult PT Treatment/Exercise - 06/12/17 0001      Knee/Hip Exercises: Stretches   Active Hamstring Stretch  Right;3 reps each way    Other Knee/Hip Stretches  standing stretches at the sink     Other Knee/Hip Stretches  hip hinge single leg       Knee/Hip Exercises: Aerobic   Stationary Bike  6 min L3 prior to PT       Knee/Hip Exercises: Prone   Prone Knee Hang  5 minutes with manual, MHP, ankle DF     Manual Therapy   Passive ROM  prone manual to hamstring, mobilizations in supine with heat       Pilates Tower for LE/Core strength, postural strength, lumbopelvic disassociation and core control.  Exercises included: Supine Leg [place]  Hamstring and ITB stretch   [place] too light  Purple springs Arcs and circles x 15-20 reps each Rt. LE  only      PT Education - 06/12/17 1426     Education provided  Yes    Education Details  hamstring stretch     Person(s) Educated  Patient    Methods  Explanation    Comprehension  Verbalized understanding       PT Short Term Goals - 06/09/17 1250      PT SHORT TERM GOAL #1   Title  Pt will be I with initial HEP for ROM and strength in L. knee     Status  Achieved      PT SHORT TERM GOAL #2   Title  Pt will be able to go without compression stocking for up to 3 hours and manage edema     Baseline  weaning from compression stocking    Status  Partially Met      PT SHORT TERM GOAL #3   Title  Pt will achieve no more than 10 deg knee extension for improved gait, flexibility.     Status  On-going        PT Long Term Goals - 06/09/17 1251      PT LONG TERM GOAL #1   Title  Pt will understand RICE, posture and body mechanics as it pertains to knee and back pain.     Status  On-going      PT LONG TERM GOAL #2   Title  Pt will be I with more advanced HEP for knee, core.     Status  On-going      PT LONG TERM GOAL #3   Title  Pt will less than 60% limited on FOTO to demo functional improvement    Baseline  61%    Status  Partially Met      PT LONG TERM GOAL #4   Title  Pt will be able to go outside and participate in family functions without limitations of knee pain, including walking on uneven ground.     Status  On-going      PT LONG TERM GOAL #5   Title  Pt will be able to walk for 30 min for general health and fitness without lasting knee pain     Status  On-going      PT LONG TERM GOAL #6   Title  Pt will be able to complete ADLs, reaching in all directions without increase in shoulder pain (donning/doffing shirt)     Status  Unable to assess      PT LONG  TERM GOAL #7   Title  Pt will demo in LUE to 4+/5 in flexion, abduction for maximal functional use of L UE     Status  Unable to assess            Plan - 06/12/17 1427    Clinical Impression Statement  Worked on knee today.  Patient reommended to  use cane or crutches, he had not been home yet , plans to use tonight.  Worked solely on knee extension today utilizing MHP, IASTM and stretching.      PT Treatment/Interventions  ADLs/Self Care Home Management;Cryotherapy;Moist Heat;Electrical Stimulation;Therapeutic activities;Therapeutic exercise;Balance training;Patient/family education;Manual techniques;Passive range of motion;Neuromuscular re-education;Taping;Vasopneumatic Device;Functional mobility training;Gait training;Stair training;DME Instruction;Iontophoresis 77m/ml Dexamethasone;Ultrasound    PT Next Visit Plan  Reformer, Tower, manual, cont knee ROM, extension .  quad stretch with patella distal glides. //// Shoulder: AROM to tolerance, scapular strength, patch, UKoreato L ant shoulder. try tape when not using ionto,  review isometrics     PT Home Exercise Plan  hamstring stretch, quad set, LAQ (has full HEP from previous episode but told to focus on knee ext ) , wall slide for quads //////supine scapular stability with red band shoulder ER red band and flexion with wand, standing  Shoulder doorway stretch,  shoulder isometrics    Consulted and Agree with Plan of Care  Patient       Patient will benefit from skilled therapeutic intervention in order to improve the following deficits and impairments:  Abnormal gait, Decreased mobility, Difficulty walking, Impaired sensation, Improper body mechanics, Decreased scar mobility, Decreased range of motion, Decreased activity tolerance, Decreased strength, Increased fascial restricitons, Impaired flexibility, Pain, Increased edema, Impaired UE functional use  Visit Diagnosis: Muscle weakness (generalized)  Localized edema  Difficulty in walking, not elsewhere classified  Acute pain of right knee     Problem List Patient Active Problem List   Diagnosis Date Noted  . Closed fracture of right ankle   . Cellulitis of right leg 05/03/2017  . Hypokalemia 05/03/2017  . Hypertension  05/03/2017  . GERD (gastroesophageal reflux disease) 05/03/2017  . Anxiety 05/03/2017  . S/P total knee replacement using cement, right 04/11/2017  . Status post total left knee replacement 09/26/2016  . Presence of left artificial knee joint 07/25/2016  . Pain and swelling of left lower leg 07/25/2016  . Bilateral primary osteoarthritis of knee 07/19/2016    Class: Chronic  . Unilateral primary osteoarthritis, right knee 07/19/2016  . 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 Hypotestosteronemia 09/08/2015  . Cervical spondylosis without myelopathy 03/25/2014    Class: Chronic  . Left cervical radiculopathy 03/25/2014    Class: Chronic  . Cervical spondylosis with radiculopathy 03/25/2014    Airi Copado 06/12/2017, 7:28 PM  [place] [address]Phone: [phone]  Fax:  [phone] Name: Jesus BARGOMRN: [id number] of Birth: [birth year] JRaeford Razor PT 06/12/17 7:28 PM Phone: [phone]: [phone]

## 2017-06-17 ENCOUNTER — Telehealth (INDEPENDENT_AMBULATORY_CARE_PROVIDER_SITE_OTHER): Payer: Self-pay | Admitting: Specialist

## 2017-06-17 NOTE — Telephone Encounter (Signed)
Patient called checking on status of CT scan, I advised that right now it has not been authorized and that he will receive a call once everything is approved.

## 2017-06-23 ENCOUNTER — Ambulatory Visit: Payer: BLUE CROSS/BLUE SHIELD | Admitting: Physical Therapy

## 2017-06-23 ENCOUNTER — Encounter: Payer: Self-pay | Admitting: Physical Therapy

## 2017-06-23 DIAGNOSIS — M6281 Muscle weakness (generalized): Secondary | ICD-10-CM

## 2017-06-23 DIAGNOSIS — M25561 Pain in right knee: Secondary | ICD-10-CM

## 2017-06-23 DIAGNOSIS — R262 Difficulty in walking, not elsewhere classified: Secondary | ICD-10-CM

## 2017-06-23 DIAGNOSIS — R600 Localized edema: Secondary | ICD-10-CM

## 2017-06-23 NOTE — Therapy (Signed)
[place], [place], [address] Phone: [phone]   Fax:  [phone]  Physical Therapy Treatment  Patient Details  Name: Jesus Brown MRN: [id number] Date of Birth: 09-29-1965 Referring Provider: Dr. Corinna Capra   Encounter Date: 06/23/2017  PT End of Session - 06/23/17 1143    Visit Number  11  Shoulder   Number of Visits  20    PT Start Time  0071    PT Stop Time  1143    PT Time Calculation (min)  45 min    Activity Tolerance  Patient tolerated treatment well    Behavior During Therapy  [facility] for tasks assessed/performed       Past Medical History:  Diagnosis Date  . Anxiety    mainly r/t pain  . Arthritis    neck, knees  . Asthma    AS CHILD   . Back pain, chronic   . DDD (degenerative disc disease)   . Dysphagia   . 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 GERD (gastroesophageal reflux disease)   . Hypertension   . Hypotestosteronemia 09/08/2015  . Neck pain, chronic     Past Surgical History:  Procedure Laterality Date  . ANKLE SURGERY    . BACK SURGERY  2010,2011   lumb fusionx2  . CERVICAL FUSION  2197,5883   3 surgeries  . DIRECT LARYNGOSCOPY N/A 11/09/2012   Procedure: DIRECT LARYNGOSCOPY;  Surgeon: Jodi Marble, MD;  Location: [place];  Service: ENT;  Laterality: N/A;  . ESOPHAGOGASTRODUODENOSCOPY N/A 08/03/2012   Procedure: ESOPHAGOGASTRODUODENOSCOPY (EGD);  Surgeon: Arta Silence, MD;  Location: [facility] ENDOSCOPY;  Service: Endoscopy;  Laterality: N/A;  . ESOPHAGOSCOPY W/ BOTOX INJECTION N/A 11/09/2012   Procedure: ESOPHAGOSCOPY POSSIBLE BIOPSY AND POSSIBLE BOTOX INJECTION CRICOPHARYNGEUS;  Surgeon: Jodi Marble, MD;  Location: [place];  Service: ENT;  Laterality: N/A;  Esophagoscopy with botox injection  . HERNIA REPAIR     ing-rt  . KNEE SURGERY     rt and left x2 each=4  . POSTERIOR CERVICAL FUSION/FORAMINOTOMY N/A 03/25/2014   Procedure: LEFT C7-T1 FORAMINOTOMY;  Surgeon: Jessy Oto, MD;  Location: [place];  Service: Orthopedics;  Laterality: N/A;  . [place]   rt and lt  . TONSILLECTOMY    . TOTAL KNEE ARTHROPLASTY Left 07/19/2016   Procedure: LEFT TOTAL KNEE ARTHROPLASTY CORTISON INJECTION IN RIGHT KNEE;  Surgeon: Jessy Oto, MD;  Location: [place];  Service: Orthopedics;  Laterality: Left;  MAY NEED RNFA TO FINISH CASE PER [place]   . TOTAL KNEE ARTHROPLASTY Right 04/11/2017   Procedure: RIGHT TOTAL KNEE ARTHROPLASTY;  Surgeon: Jessy Oto, MD;  Location: [place];  Service: Orthopedics;  Laterality: Right;  . WRIST SURGERY     lt and rt    There were no vitals filed for this visit.  Subjective Assessment - 06/23/17 1059    Subjective  Its getting better, hurts when I lay on it.  Pain with putting on shirt.      Currently in Pain?  Yes    Pain Score  3     Pain Location  Shoulder    Pain Orientation  Left    Pain Descriptors / Indicators  Aching    Pain Type  Chronic pain    Pain Frequency  Intermittent    Aggravating Factors   donning shirt     Pain Relieving Factors  ice, rest  [facility] Adult PT Treatment/Exercise - 06/23/17 0001      Pilates   Pilates Reformer  knee flexion/ extension  1 yellow, 1 red cued for abdominal control awareness.    Pilates Tower  purple  AA SLR and extension  13 x     hamstring stretch  3 x 30 purple,  yellow circles      Knee/Hip Exercises: Stretches   Passive Hamstring Stretch  3 reps;30 seconds      Knee/Hip Exercises: Prone   Other Prone Exercises  terminal knee extension 10 x      Shoulder Exercises: Seated   External Rotation  Left;20 reps    Theraband Level (Shoulder External Rotation)  -- at 60 deg abduction     External Rotation Weight (lbs)  2    Flexion  Left;12 reps    Flexion Weight (lbs)  2    Abduction  Left;20 reps    ABduction Weight (lbs)  2      Shoulder Exercises: Standing   Horizontal ABduction   Strengthening;Both;12 reps    Theraband Level (Shoulder Horizontal ABduction)  Level 2 (Red)    External Rotation  Strengthening;Both;12 reps    Theraband Level (Shoulder External Rotation)  Level 2 (Red)    Flexion  Strengthening;Both;10 reps    Shoulder Flexion Weight (lbs)  narrow grip     Diagonals  Strengthening;Left    Diagonals Weight (lbs)  too painful     Other Standing Exercises  looped band reaching for scapular work       Shoulder Exercises: ROM/Strengthening   Wall Pushups  10 reps 2 sets, 1 wide, 1 triceps      Moist Heat Therapy   Moist Heat Location  -- distal knee during manual      Ultrasound   Ultrasound Location  Lt ant shoulder    Ultrasound Parameters  50%, 1 W/cm2, 1 MHz    Ultrasound Goals  Pain      Iontophoresis   Type of Iontophoresis  Dexamethasone    Location  L ant sup shoulder     Dose  1 cc     Time  6 hour patch       Manual Therapy   Manual therapy comments  L shoulder: PROM all planes to tolerance ,     Joint Mobilization  patellar medial lateral mobilizations flexibility improved    Soft tissue mobilization  instrument assist distal quads and lateral hamstrings to soften     Passive ROM  prone manual to hamstring, mobilizations in supine with heat              PT Education - 06/23/17 1157    Education provided  Yes    Education Details  scapular position     Person(s) Educated  Patient    Methods  Explanation    Comprehension  Verbalized understanding       PT Short Term Goals - 06/23/17 1116      PT SHORT TERM GOAL #1   Title  Pt will be I with initial HEP for ROM and strength in L. knee     Time  4    Period  Weeks    Status  Achieved      PT SHORT TERM GOAL #2   Title  Pt will be able to go without compression stocking for up to 3 hours and manage edema     Baseline  wears stocking  all the time.  he has currently  ripped it and needs to get a new one.  Currently not wearing    Time  4    Period  Weeks    Status   Partially Met      PT SHORT TERM GOAL #3   Title  Pt will achieve no more than 10 deg knee extension for improved gait, flexibility.     Baseline  -14 passive    Time  4    Period  Weeks    Status  On-going        PT Long Term Goals - 06/09/17 1251      PT LONG TERM GOAL #1   Title  Pt will understand RICE, posture and body mechanics as it pertains to knee and back pain.     Status  On-going      PT LONG TERM GOAL #2   Title  Pt will be I with more advanced HEP for knee, core.     Status  On-going      PT LONG TERM GOAL #3   Title  Pt will less than 60% limited on FOTO to demo functional improvement    Baseline  61%    Status  Partially Met      PT LONG TERM GOAL #4   Title  Pt will be able to go outside and participate in family functions without limitations of knee pain, including walking on uneven ground.     Status  On-going      PT LONG TERM GOAL #5   Title  Pt will be able to walk for 30 min for general health and fitness without lasting knee pain     Status  On-going      PT LONG TERM GOAL #6   Title  Pt will be able to complete ADLs, reaching in all directions without increase in shoulder pain (donning/doffing shirt)     Status  Unable to assess      PT LONG TERM GOAL #7   Title  Pt will demo in LUE to 4+/5 in flexion, abduction for maximal functional use of L UE     Status  Unable to assess            Plan - 06/23/17 1158    Clinical Impression Statement  Shoulder session right after knee.  Shoulder pain is getting better overall. Cont to have pain with reaching, especially when humerus externally rotated.      PT Treatment/Interventions  ADLs/Self Care Home Management;Cryotherapy;Moist Heat;Electrical Stimulation;Therapeutic activities;Therapeutic exercise;Balance training;Patient/family education;Manual techniques;Passive range of motion;Neuromuscular re-education;Taping;Vasopneumatic Device;Functional mobility training;Gait training;Stair training;DME  Instruction;Iontophoresis '4mg'$ /ml Dexamethasone;Ultrasound    PT Next Visit Plan  cont strengthening within pain limits.  modalties.     PT Home Exercise Plan  hamstring stretch, quad set, LAQ (has full HEP from previous episode but told to focus on knee ext ) , wall slide for quads //////supine scapular stability with red band shoulder ER red band and flexion with wand, standing  Shoulder doorway stretch,  shoulder isometrics    Consulted and Agree with Plan of Care  Patient       Patient will benefit from skilled therapeutic intervention in order to improve the following deficits and impairments:  Abnormal gait, Decreased mobility, Difficulty walking, Impaired sensation, Improper body mechanics, Decreased scar mobility, Decreased range of motion, Decreased activity tolerance, Decreased strength, Increased fascial restricitons, Impaired flexibility, Pain, Increased edema, Impaired UE functional use  Visit Diagnosis: Muscle weakness (generalized)  Localized edema  Difficulty  in walking, not elsewhere classified  Acute pain of right knee     Problem List Patient Active Problem List   Diagnosis Date Noted  . Closed fracture of right ankle   . Cellulitis of right leg 05/03/2017  . Hypokalemia 05/03/2017  . Hypertension 05/03/2017  . GERD (gastroesophageal reflux disease) 05/03/2017  . Anxiety 05/03/2017  . S/P total knee replacement using cement, right 04/11/2017  . Status post total left knee replacement 09/26/2016  . Presence of left artificial knee joint 07/25/2016  . Pain and swelling of left lower leg 07/25/2016  . Bilateral primary osteoarthritis of knee 07/19/2016    Class: Chronic  . Unilateral primary osteoarthritis, right knee 07/19/2016  . 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 Hypotestosteronemia 09/08/2015  . Cervical spondylosis without myelopathy 03/25/2014    Class: Chronic  . Left cervical radiculopathy 03/25/2014    Class: Chronic  . Cervical  spondylosis with radiculopathy 03/25/2014    Girard Koontz 06/23/2017, 12:52 PM  [facility] [address] [place], [place], [address] Phone: [phone]   Fax:  [phone]  Name: Jesus Brown MRN: [id number] Date of Birth: 1966/03/26  Raeford Razor, PT 06/23/17 12:54 PM Phone: [phone] Fax: [phone]

## 2017-06-23 NOTE — Therapy (Signed)
[place], [place], [address] Phone: [phone]   Fax:  [phone]  Physical Therapy Treatment  Patient Details  Name: Jesus Brown MRN: [id number] Date of Birth: 27-Nov-1965 Referring Provider: Dr. Corinna Capra   Encounter Date: 06/23/2017  PT End of Session - 06/23/17 1111    Visit Number  10 knee    Number of Visits  20    Date for PT Re-Evaluation  06/23/17    PT Start Time  1012    PT Stop Time  1055    PT Time Calculation (min)  43 min    Behavior During Therapy  [facility] for tasks assessed/performed       Past Medical History:  Diagnosis Date  . Anxiety    mainly r/t pain  . Arthritis    neck, knees  . Asthma    AS CHILD   . Back pain, chronic   . DDD (degenerative disc disease)   . Dysphagia   . 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 GERD (gastroesophageal reflux disease)   . Hypertension   . Hypotestosteronemia 09/08/2015  . Neck pain, chronic     Past Surgical History:  Procedure Laterality Date  . ANKLE SURGERY    . BACK SURGERY  2010,2011   lumb fusionx2  . CERVICAL FUSION  7619,5093   3 surgeries  . DIRECT LARYNGOSCOPY N/A 11/09/2012   Procedure: DIRECT LARYNGOSCOPY;  Surgeon: Jodi Marble, MD;  Location: [place];  Service: ENT;  Laterality: N/A;  . ESOPHAGOGASTRODUODENOSCOPY N/A 08/03/2012   Procedure: ESOPHAGOGASTRODUODENOSCOPY (EGD);  Surgeon: Arta Silence, MD;  Location: [facility] ENDOSCOPY;  Service: Endoscopy;  Laterality: N/A;  . ESOPHAGOSCOPY W/ BOTOX INJECTION N/A 11/09/2012   Procedure: ESOPHAGOSCOPY POSSIBLE BIOPSY AND POSSIBLE BOTOX INJECTION CRICOPHARYNGEUS;  Surgeon: Jodi Marble, MD;  Location: [place];  Service: ENT;  Laterality: N/A;  Esophagoscopy with botox injection  . HERNIA REPAIR     ing-rt  . KNEE SURGERY     rt and left x2 each=4  . POSTERIOR CERVICAL FUSION/FORAMINOTOMY N/A 03/25/2014   Procedure: LEFT C7-T1  FORAMINOTOMY;  Surgeon: Jessy Oto, MD;  Location: [place];  Service: Orthopedics;  Laterality: N/A;  . [place]   rt and lt  . TONSILLECTOMY    . TOTAL KNEE ARTHROPLASTY Left 07/19/2016   Procedure: LEFT TOTAL KNEE ARTHROPLASTY CORTISON INJECTION IN RIGHT KNEE;  Surgeon: Jessy Oto, MD;  Location: [place];  Service: Orthopedics;  Laterality: Left;  MAY NEED RNFA TO FINISH CASE PER [place]   . TOTAL KNEE ARTHROPLASTY Right 04/11/2017   Procedure: RIGHT TOTAL KNEE ARTHROPLASTY;  Surgeon: Jessy Oto, MD;  Location: [place];  Service: Orthopedics;  Laterality: Right;  . WRIST SURGERY     lt and rt    There were no vitals filed for this visit.  Subjective Assessment - 06/23/17 1059    Subjective  Its getting better, hurts when I lay on it.  Pain with putting on shirt.      Currently in Pain?  Yes    Pain Score  3     Pain Location  Shoulder    Pain Orientation  Left    Pain Descriptors / Indicators  Aching    Pain Type  Chronic pain    Pain Frequency  Intermittent    Aggravating Factors   donning shirt     Pain Relieving Factors  ice, rest  [facility] PT Assessment - 06/23/17 0001      AROM   Right Knee Flexion  115 on reformer      PROM   Overall PROM Comments  -14 extension right                  [facility] Adult PT Treatment/Exercise - 06/23/17 0001      Pilates   Pilates Reformer  knee flexion/ extension  1 yellow, 1 red cued for abdominal control awareness.    Pilates Tower  purple  AA SLR and extension  13 x     hamstring stretch  3 x 30 purple,  yellow circles      Knee/Hip Exercises: Stretches   Passive Hamstring Stretch  3 reps;30 seconds      Knee/Hip Exercises: Prone   Other Prone Exercises  terminal knee extension 10 x      Shoulder Exercises: Standing   Horizontal ABduction  Strengthening;Both;12 reps    Theraband Level (Shoulder Horizontal ABduction)  Level 2 (Red)    External Rotation  Strengthening;Both;12 reps    Theraband  Level (Shoulder External Rotation)  Level 2 (Red)    Flexion  Strengthening;Both;10 reps    Shoulder Flexion Weight (lbs)  narrow grip     Diagonals  Strengthening;Left    Diagonals Weight (lbs)  too painful     Other Standing Exercises  looped band reaching for scapular work       Shoulder Exercises: ROM/Strengthening   Wall Pushups  10 reps 2 sets, 1 wide, 1 triceps      Moist Heat Therapy   Moist Heat Location  -- distal knee during manual      Manual Therapy   Joint Mobilization  patellar medial lateral mobilizations flexibility improved    Soft tissue mobilization  instrument assist distal quads and lateral hamstrings to soften     Passive ROM  prone manual to hamstring, mobilizations in supine with heat                PT Short Term Goals - 06/23/17 1116      PT SHORT TERM GOAL #1   Title  Pt will be I with initial HEP for ROM and strength in L. knee     Time  4    Period  Weeks    Status  Achieved      PT SHORT TERM GOAL #2   Title  Pt will be able to go without compression stocking for up to 3 hours and manage edema     Baseline  wears stocking  all the time.  he has currently ripped it and needs to get a new one.  Currently not wearing    Time  4    Period  Weeks    Status  Partially Met      PT SHORT TERM GOAL #3   Title  Pt will achieve no more than 10 deg knee extension for improved gait, flexibility.     Baseline  -14 passive    Time  4    Period  Weeks    Status  On-going        PT Long Term Goals - 06/09/17 1251      PT LONG TERM GOAL #1   Title  Pt will understand RICE, posture and body mechanics as it pertains to knee and back pain.     Status  On-going      PT LONG TERM GOAL #2  Title  Pt will be I with more advanced HEP for knee, core.     Status  On-going      PT LONG TERM GOAL #3   Title  Pt will less than 60% limited on FOTO to demo functional improvement    Baseline  61%    Status  Partially Met      PT LONG TERM GOAL #4    Title  Pt will be able to go outside and participate in family functions without limitations of knee pain, including walking on uneven ground.     Status  On-going      PT LONG TERM GOAL #5   Title  Pt will be able to walk for 30 min for general health and fitness without lasting knee pain     Status  On-going      PT LONG TERM GOAL #6   Title  Pt will be able to complete ADLs, reaching in all directions without increase in shoulder pain (donning/doffing shirt)     Status  Unable to assess      PT LONG TERM GOAL #7   Title  Pt will demo in LUE to 4+/5 in flexion, abduction for maximal functional use of L UE     Status  Unable to assess            Plan - 06/23/17 1112    Clinical Impression Statement  Knee session today.  focused on stretching and manual for extension.  PROM to -14 extension.  pain continued to be 4/10 at end of session.  He does not yet have the stretching device.      PT Next Visit Plan  Continue stretches.   MRI not until the 28 th    PT Home Exercise Plan  hamstring stretch, quad set, LAQ (has full HEP from previous episode but told to focus on knee ext ) , wall slide for quads //////supine scapular stability with red band shoulder ER red band and flexion with wand, standing  Shoulder doorway stretch,  shoulder isometrics    Consulted and Agree with Plan of Care  Patient       Patient will benefit from skilled therapeutic intervention in order to improve the following deficits and impairments:     Visit Diagnosis: Muscle weakness (generalized)  Localized edema  Difficulty in walking, not elsewhere classified  Acute pain of right knee     Problem List Patient Active Problem List   Diagnosis Date Noted  . Closed fracture of right ankle   . Cellulitis of right leg 05/03/2017  . Hypokalemia 05/03/2017  . Hypertension 05/03/2017  . GERD (gastroesophageal reflux disease) 05/03/2017  . Anxiety 05/03/2017  . S/P total knee replacement using cement,  right 04/11/2017  . Status post total left knee replacement 09/26/2016  . Presence of left artificial knee joint 07/25/2016  . Pain and swelling of left lower leg 07/25/2016  . Bilateral primary osteoarthritis of knee 07/19/2016    Class: Chronic  . Unilateral primary osteoarthritis, right knee 07/19/2016  . 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 Hypotestosteronemia 09/08/2015  . Cervical spondylosis without myelopathy 03/25/2014    Class: Chronic  . Left cervical radiculopathy 03/25/2014    Class: Chronic  . Cervical spondylosis with radiculopathy 03/25/2014    Nastasha Reising PTA 06/23/2017, 11:22 AM  [place], [place], [address] Phone: [phone]   Fax:  [phone]  Name: Jesus Brown MRN: [id number]  Date of Birth: 24-Aug-1965

## 2017-06-25 ENCOUNTER — Ambulatory Visit: Payer: BLUE CROSS/BLUE SHIELD | Admitting: Physical Therapy

## 2017-06-25 ENCOUNTER — Encounter: Payer: Self-pay | Admitting: Physical Therapy

## 2017-06-25 DIAGNOSIS — M6281 Muscle weakness (generalized): Secondary | ICD-10-CM | POA: Diagnosis not present

## 2017-06-25 DIAGNOSIS — M25561 Pain in right knee: Secondary | ICD-10-CM

## 2017-06-25 DIAGNOSIS — R262 Difficulty in walking, not elsewhere classified: Secondary | ICD-10-CM

## 2017-06-25 DIAGNOSIS — R600 Localized edema: Secondary | ICD-10-CM

## 2017-06-25 NOTE — Therapy (Signed)
[place], [place], [address] Phone: [phone]   Fax:  [phone]  Physical Therapy Treatment  Patient Details  Name: Jesus Brown MRN: [id number] Date of Birth: 31-Jul-1965 Referring Provider: Dr. Corinna Capra   Encounter Date: 06/25/2017  PT End of Session - 06/25/17 1115    Visit Number  12 Knee    Number of Visits  20    PT Start Time  1021    PT Stop Time  1100    PT Time Calculation (min)  39 min    Activity Tolerance  Patient tolerated treatment well    Behavior During Therapy  [facility] for tasks assessed/performed       Past Medical History:  Diagnosis Date  . Anxiety    mainly r/t pain  . Arthritis    neck, knees  . Asthma    AS CHILD   . Back pain, chronic   . DDD (degenerative disc disease)   . Dysphagia   . 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 GERD (gastroesophageal reflux disease)   . Hypertension   . Hypotestosteronemia 09/08/2015  . Neck pain, chronic     Past Surgical History:  Procedure Laterality Date  . ANKLE SURGERY    . BACK SURGERY  2010,2011   lumb fusionx2  . CERVICAL FUSION  1751,0258   3 surgeries  . DIRECT LARYNGOSCOPY N/A 11/09/2012   Procedure: DIRECT LARYNGOSCOPY;  Surgeon: Jodi Marble, MD;  Location: [place];  Service: ENT;  Laterality: N/A;  . ESOPHAGOGASTRODUODENOSCOPY N/A 08/03/2012   Procedure: ESOPHAGOGASTRODUODENOSCOPY (EGD);  Surgeon: Arta Silence, MD;  Location: [facility] ENDOSCOPY;  Service: Endoscopy;  Laterality: N/A;  . ESOPHAGOSCOPY W/ BOTOX INJECTION N/A 11/09/2012   Procedure: ESOPHAGOSCOPY POSSIBLE BIOPSY AND POSSIBLE BOTOX INJECTION CRICOPHARYNGEUS;  Surgeon: Jodi Marble, MD;  Location: [place];  Service: ENT;  Laterality: N/A;  Esophagoscopy with botox injection  . HERNIA REPAIR     ing-rt  . KNEE SURGERY     rt and left x2 each=4  . POSTERIOR CERVICAL FUSION/FORAMINOTOMY N/A 03/25/2014    Procedure: LEFT C7-T1 FORAMINOTOMY;  Surgeon: Jessy Oto, MD;  Location: [place];  Service: Orthopedics;  Laterality: N/A;  . [place]   rt and lt  . TONSILLECTOMY    . TOTAL KNEE ARTHROPLASTY Left 07/19/2016   Procedure: LEFT TOTAL KNEE ARTHROPLASTY CORTISON INJECTION IN RIGHT KNEE;  Surgeon: Jessy Oto, MD;  Location: [place];  Service: Orthopedics;  Laterality: Left;  MAY NEED RNFA TO FINISH CASE PER [place]   . TOTAL KNEE ARTHROPLASTY Right 04/11/2017   Procedure: RIGHT TOTAL KNEE ARTHROPLASTY;  Surgeon: Jessy Oto, MD;  Location: [place];  Service: Orthopedics;  Laterality: Right;  . WRIST SURGERY     lt and rt    There were no vitals filed for this visit.  Subjective Assessment - 06/25/17 1026    Subjective     Back on right pain increased after last visit later in the afternoon,  ?  purple  springs used too long??    Brace not here yet.      Pain Score  4    Pain Location  Knee Back pian right  up to 8/10 with moving the wrong way.      Pain Orientation  Right;Anterior    Pain Radiating Towards  distal    Aggravating Factors   walking,  stretching    Pain Relieving  Factors  cane,  manual,  heat                      [facility] Adult PT Treatment/Exercise - 06/25/17 0001      Self-Care   Self-Care  -- call to [employer] rep message left for arrival date info.       Knee/Hip Exercises: Aerobic   Nustep  LE only      Moist Heat Therapy   Moist Heat Location  -- used concurrent with knee manual.  weight helped stretch       Manual Therapy   Joint Mobilization  patellar medial lateral mobilizations flexibility improved stiff initially    Soft tissue mobilization  instrument assist distal quads and lateral hamstrings to soften     Passive ROM  extension               PT Short Term Goals - 06/23/17 1116      PT SHORT TERM GOAL #1   Title  Pt will be I with initial HEP for ROM and strength in L. knee     Time  4    Period  Weeks    Status   Achieved      PT SHORT TERM GOAL #2   Title  Pt will be able to go without compression stocking for up to 3 hours and manage edema     Baseline  wears stocking  all the time.  he has currently ripped it and needs to get a new one.  Currently not wearing    Time  4    Period  Weeks    Status  Partially Met      PT SHORT TERM GOAL #3   Title  Pt will achieve no more than 10 deg knee extension for improved gait, flexibility.     Baseline  -14 passive    Time  4    Period  Weeks    Status  On-going        PT Long Term Goals - 06/09/17 1251      PT LONG TERM GOAL #1   Title  Pt will understand RICE, posture and body mechanics as it pertains to knee and back pain.     Status  On-going      PT LONG TERM GOAL #2   Title  Pt will be I with more advanced HEP for knee, core.     Status  On-going      PT LONG TERM GOAL #3   Title  Pt will less than 60% limited on FOTO to demo functional improvement    Baseline  61%    Status  Partially Met      PT LONG TERM GOAL #4   Title  Pt will be able to go outside and participate in family functions without limitations of knee pain, including walking on uneven ground.     Status  On-going      PT LONG TERM GOAL #5   Title  Pt will be able to walk for 30 min for general health and fitness without lasting knee pain     Status  On-going      PT LONG TERM GOAL #6   Title  Pt will be able to complete ADLs, reaching in all directions without increase in shoulder pain (donning/doffing shirt)     Status  Unable to assess      PT LONG TERM GOAL #7   Title  Pt will demo in LUE to 4+/5 in flexion, abduction for maximal functional use of L UE     Status  Unable to assess            Plan - 06/25/17 1115    Clinical Impression Statement  Knee focus today.  Patient requested manual due to pain increasing later after last visit (Back)  -12 post session today estimated.  ROM gradually improved.  Progress toward ROM goal.   No modalities needed post  session.    PT Next Visit Plan  cont strengthening within pain limits.  modalties.   ROM knee    PT Home Exercise Plan  hamstring stretch, quad set, LAQ (has full HEP from previous episode but told to focus on knee ext ) , wall slide for quads //////supine scapular stability with red band shoulder ER red band and flexion with wand, standing  Shoulder doorway stretch,  shoulder isometrics    Consulted and Agree with Plan of Care  Patient       Patient will benefit from skilled therapeutic intervention in order to improve the following deficits and impairments:     Visit Diagnosis: Muscle weakness (generalized)  Localized edema  Difficulty in walking, not elsewhere classified  Acute pain of right knee     Problem List Patient Active Problem List   Diagnosis Date Noted  . Closed fracture of right ankle   . Cellulitis of right leg 05/03/2017  . Hypokalemia 05/03/2017  . Hypertension 05/03/2017  . GERD (gastroesophageal reflux disease) 05/03/2017  . Anxiety 05/03/2017  . S/P total knee replacement using cement, right 04/11/2017  . Status post total left knee replacement 09/26/2016  . Presence of left artificial knee joint 07/25/2016  . Pain and swelling of left lower leg 07/25/2016  . Bilateral primary osteoarthritis of knee 07/19/2016    Class: Chronic  . Unilateral primary osteoarthritis, right knee 07/19/2016  . 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 Hypotestosteronemia 09/08/2015  . Cervical spondylosis without myelopathy 03/25/2014    Class: Chronic  . Left cervical radiculopathy 03/25/2014    Class: Chronic  . Cervical spondylosis with radiculopathy 03/25/2014    Lonnetta Kniskern PTA 06/25/2017, 11:22 AM  [place], [place], [address] Phone: [phone]   Fax:  [phone]  Name: Jesus Brown MRN: [id number] Date of Birth: [birth year]

## 2017-06-26 ENCOUNTER — Telehealth (INDEPENDENT_AMBULATORY_CARE_PROVIDER_SITE_OTHER): Payer: Self-pay | Admitting: Specialist

## 2017-06-26 NOTE — Telephone Encounter (Signed)
Patient called saying that he is needing a note from Dr. Louanne Skye approving the [facility]. Jesus Brown is his PT her number is [phone] or if you have any questions you can give him a call at [phone]

## 2017-06-27 NOTE — Telephone Encounter (Signed)
Patient called saying that he is needing a note from Dr. Louanne Skye approving the [facility].

## 2017-06-30 ENCOUNTER — Ambulatory Visit: Payer: BLUE CROSS/BLUE SHIELD | Admitting: Physical Therapy

## 2017-06-30 ENCOUNTER — Encounter: Payer: Self-pay | Admitting: Physical Therapy

## 2017-06-30 ENCOUNTER — Encounter (INDEPENDENT_AMBULATORY_CARE_PROVIDER_SITE_OTHER): Payer: Self-pay | Admitting: Specialist

## 2017-06-30 DIAGNOSIS — R262 Difficulty in walking, not elsewhere classified: Secondary | ICD-10-CM

## 2017-06-30 DIAGNOSIS — M6281 Muscle weakness (generalized): Secondary | ICD-10-CM

## 2017-06-30 DIAGNOSIS — R600 Localized edema: Secondary | ICD-10-CM

## 2017-06-30 DIAGNOSIS — M25561 Pain in right knee: Secondary | ICD-10-CM

## 2017-06-30 NOTE — Therapy (Signed)
[place], [place], [address] Phone: [phone]   Fax:  [phone]  Physical Therapy Treatment  Patient Details  Name: Jesus Brown MRN: [id number] Date of Birth: Jan 10, 1966 Referring Provider: Dr. Corinna Capra   Encounter Date: 06/30/2017  PT End of Session - 06/30/17 1144    Visit Number  13    Number of Visits  20    PT Start Time  8416 charge will not equal time slot.  PTA sent patient home early.  i did not realize I had him 2 slots.     PT Stop Time  1100    PT Time Calculation (min)  43 min    Behavior During Therapy  [facility] for tasks assessed/performed       Past Medical History:  Diagnosis Date  . Anxiety    mainly r/t pain  . Arthritis    neck, knees  . Asthma    AS CHILD   . Back pain, chronic   . DDD (degenerative disc disease)   . Dysphagia   . 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 GERD (gastroesophageal reflux disease)   . Hypertension   . Hypotestosteronemia 09/08/2015  . Neck pain, chronic     Past Surgical History:  Procedure Laterality Date  . ANKLE SURGERY    . BACK SURGERY  2010,2011   lumb fusionx2  . CERVICAL FUSION  6063,0160   3 surgeries  . DIRECT LARYNGOSCOPY N/A 11/09/2012   Procedure: DIRECT LARYNGOSCOPY;  Surgeon: Jodi Marble, MD;  Location: [place];  Service: ENT;  Laterality: N/A;  . ESOPHAGOGASTRODUODENOSCOPY N/A 08/03/2012   Procedure: ESOPHAGOGASTRODUODENOSCOPY (EGD);  Surgeon: Arta Silence, MD;  Location: [facility] ENDOSCOPY;  Service: Endoscopy;  Laterality: N/A;  . ESOPHAGOSCOPY W/ BOTOX INJECTION N/A 11/09/2012   Procedure: ESOPHAGOSCOPY POSSIBLE BIOPSY AND POSSIBLE BOTOX INJECTION CRICOPHARYNGEUS;  Surgeon: Jodi Marble, MD;  Location: [place];  Service: ENT;  Laterality: N/A;  Esophagoscopy with botox injection  . HERNIA REPAIR     ing-rt  . KNEE SURGERY     rt and left x2 each=4  . POSTERIOR CERVICAL  FUSION/FORAMINOTOMY N/A 03/25/2014   Procedure: LEFT C7-T1 FORAMINOTOMY;  Surgeon: Jessy Oto, MD;  Location: [place];  Service: Orthopedics;  Laterality: N/A;  . [place]   rt and lt  . TONSILLECTOMY    . TOTAL KNEE ARTHROPLASTY Left 07/19/2016   Procedure: LEFT TOTAL KNEE ARTHROPLASTY CORTISON INJECTION IN RIGHT KNEE;  Surgeon: Jessy Oto, MD;  Location: [place];  Service: Orthopedics;  Laterality: Left;  MAY NEED RNFA TO FINISH CASE PER [place]   . TOTAL KNEE ARTHROPLASTY Right 04/11/2017   Procedure: RIGHT TOTAL KNEE ARTHROPLASTY;  Surgeon: Jessy Oto, MD;  Location: [place];  Service: Orthopedics;  Laterality: Right;  . WRIST SURGERY     lt and rt    There were no vitals filed for this visit.  Subjective Assessment - 06/30/17 1049    Subjective  I thought i had Jenn too today.  I want the scar tissue work today for the knee.     Pain Score  4    Pain Location  Knee    Pain Orientation  Right;Anterior    Pain Descriptors / Indicators  Sore    Pain Radiating Towards  distal    Pain Frequency  Intermittent    Aggravating Factors   walking stretching    Pain Relieving Factors  instrument assist,  manual                No data recorded       [facility] Adult PT Treatment/Exercise - 06/30/17 0001      Knee/Hip Exercises: Stretches   Quad Stretch  -- 10 X 10 seconmds.  Standing step stretch    Hip Flexor Stretch  3 reps;30 seconds quad stretch also with manual  strumming      Knee/Hip Exercises: Aerobic   Stationary Bike  3 minutes 6 total.  Patient started prior to session.      Knee/Hip Exercises: Standing   Forward Step Up  10 reps    Step Down  -- demo correct technique.  patient reluctant afraid LT      Moist Heat Therapy   Moist Heat Location  Knee posterior  during manual      Manual Therapy   Joint Mobilization  grade 2 for knee extension.    Soft tissue mobilization  instrument assist distal quads and lateral hamstrings to soften      Passive ROM  extension, flexion             PT Education - 06/30/17 1151    Education provided  Yes    Education Details  to build right quad strength   try more step ups.     Person(s) Educated  Patient    Methods  Explanation;Demonstration;Verbal cues    Comprehension  Verbalized understanding;Returned demonstration       PT Short Term Goals - 06/23/17 1116      PT SHORT TERM GOAL #1   Title  Pt will be I with initial HEP for ROM and strength in L. knee     Time  4    Period  Weeks    Status  Achieved      PT SHORT TERM GOAL #2   Title  Pt will be able to go without compression stocking for up to 3 hours and manage edema     Baseline  wears stocking  all the time.  he has currently ripped it and needs to get a new one.  Currently not wearing    Time  4    Period  Weeks    Status  Partially Met      PT SHORT TERM GOAL #3   Title  Pt will achieve no more than 10 deg knee extension for improved gait, flexibility.     Baseline  -14 passive    Time  4    Period  Weeks    Status  On-going        PT Long Term Goals - 06/09/17 1251      PT LONG TERM GOAL #1   Title  Pt will understand RICE, posture and body mechanics as it pertains to knee and back pain.     Status  On-going      PT LONG TERM GOAL #2   Title  Pt will be I with more advanced HEP for knee, core.     Status  On-going      PT LONG TERM GOAL #3   Title  Pt will less than 60% limited on FOTO to demo functional improvement    Baseline  61%    Status  Partially Met      PT LONG TERM GOAL #4   Title  Pt will be able to go outside and participate in family functions without limitations of knee pain,  including walking on uneven ground.     Status  On-going      PT LONG TERM GOAL #5   Title  Pt will be able to walk for 30 min for general health and fitness without lasting knee pain     Status  On-going      PT LONG TERM GOAL #6   Title  Pt will be able to complete ADLs, reaching in all directions  without increase in shoulder pain (donning/doffing shirt)     Status  Unable to assess      PT LONG TERM GOAL #7   Title  Pt will demo in LUE to 4+/5 in flexion, abduction for maximal functional use of L UE     Status  Unable to assess            Plan - 06/30/17 1145    Clinical Impression Statement  Knee focus today. (PTA did not realize he was blocked for 2 sessions) Manual requested by patient -18 , a little stiffer than last visit.  Patient continues to wait for brace.  Less stiffness with gait noted.      PT Next Visit Plan  cont strengthening within pain limits.  modalties.   ROM knee/  shoulder    PT Home Exercise Plan  hamstring stretch, quad set, LAQ (has full HEP from previous episode but told to focus on knee ext ) , wall slide for quads //////supine scapular stability with red band shoulder ER red band and flexion with wand, standing  Shoulder doorway stretch,  shoulder isometrics.  step ups  .    Consulted and Agree with Plan of Care  Patient       Patient will benefit from skilled therapeutic intervention in order to improve the following deficits and impairments:     Visit Diagnosis: Muscle weakness (generalized)  Localized edema  Difficulty in walking, not elsewhere classified  Acute pain of right knee     Problem List Patient Active Problem List   Diagnosis Date Noted  . Closed fracture of right ankle   . Cellulitis of right leg 05/03/2017  . Hypokalemia 05/03/2017  . Hypertension 05/03/2017  . GERD (gastroesophageal reflux disease) 05/03/2017  . Anxiety 05/03/2017  . S/P total knee replacement using cement, right 04/11/2017  . Status post total left knee replacement 09/26/2016  . Presence of left artificial knee joint 07/25/2016  . Pain and swelling of left lower leg 07/25/2016  . Bilateral primary osteoarthritis of knee 07/19/2016    Class: Chronic  . Unilateral primary osteoarthritis, right knee 07/19/2016  . Erythrocytosis due to endocrine  disorders 09/08/2015  . Hypotestosteronemia 09/08/2015  . Cervical spondylosis without myelopathy 03/25/2014    Class: Chronic  . Left cervical radiculopathy 03/25/2014    Class: Chronic  . Cervical spondylosis with radiculopathy 03/25/2014    Latona Krichbaum PTA 06/30/2017, 11:54 AM  [place], [place], [address] Phone: [phone]   Fax:  [phone]  Name: Jesus Brown MRN: [id number] Date of Birth: 01-22-1966

## 2017-06-30 NOTE — Telephone Encounter (Signed)
Letter for the knee brace printed. jen

## 2017-07-01 NOTE — Telephone Encounter (Signed)
Note faxed to [facility]

## 2017-07-02 ENCOUNTER — Ambulatory Visit: Payer: BLUE CROSS/BLUE SHIELD | Admitting: Physical Therapy

## 2017-07-02 DIAGNOSIS — M25561 Pain in right knee: Secondary | ICD-10-CM

## 2017-07-02 DIAGNOSIS — M6281 Muscle weakness (generalized): Secondary | ICD-10-CM

## 2017-07-02 DIAGNOSIS — R600 Localized edema: Secondary | ICD-10-CM

## 2017-07-02 DIAGNOSIS — R262 Difficulty in walking, not elsewhere classified: Secondary | ICD-10-CM

## 2017-07-02 NOTE — Therapy (Signed)
Jesus Brown, [place], [address] Phone: [phone]   Fax:  [phone]  Physical Therapy Treatment and Renewal  Patient Details  Name: Jesus Brown MRN: [id number] Date of Birth: Mar 02, 1966 Referring Provider: Dr. Corinna Capra   Encounter Date: 07/02/2017  PT End of Session - 07/02/17 1018    Visit Number  14 knee     Number of Visits  20    Date for PT Re-Evaluation  07/30/17 4/24 for knee     PT Start Time  1015    PT Stop Time  1100    PT Time Calculation (min)  45 min    Activity Tolerance  Patient tolerated treatment well    Behavior During Therapy  [facility] for tasks assessed/performed       Past Medical History:  Diagnosis Date  . Anxiety    mainly r/t pain  . Arthritis    neck, knees  . Asthma    AS CHILD   . Back pain, chronic   . DDD (degenerative disc disease)   . Dysphagia   . 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 GERD (gastroesophageal reflux disease)   . Hypertension   . Hypotestosteronemia 09/08/2015  . Neck pain, chronic     Past Surgical History:  Procedure Laterality Date  . ANKLE SURGERY    . BACK SURGERY  2010,2011   lumb fusionx2  . CERVICAL FUSION  4496,7591   3 surgeries  . DIRECT LARYNGOSCOPY N/A 11/09/2012   Procedure: DIRECT LARYNGOSCOPY;  Surgeon: Jodi Marble, MD;  Location: [place];  Service: ENT;  Laterality: N/A;  . ESOPHAGOGASTRODUODENOSCOPY N/A 08/03/2012   Procedure: ESOPHAGOGASTRODUODENOSCOPY (EGD);  Surgeon: Arta Silence, MD;  Location: [facility] ENDOSCOPY;  Service: Endoscopy;  Laterality: N/A;  . ESOPHAGOSCOPY W/ BOTOX INJECTION N/A 11/09/2012   Procedure: ESOPHAGOSCOPY POSSIBLE BIOPSY AND POSSIBLE BOTOX INJECTION CRICOPHARYNGEUS;  Surgeon: Jodi Marble, MD;  Location: [place];  Service: ENT;  Laterality: N/A;  Esophagoscopy with botox injection  . HERNIA REPAIR     ing-rt  . KNEE SURGERY     rt and left x2 each=4   . POSTERIOR CERVICAL FUSION/FORAMINOTOMY N/A 03/25/2014   Procedure: LEFT C7-T1 FORAMINOTOMY;  Surgeon: Jessy Oto, MD;  Location: [place];  Service: Orthopedics;  Laterality: N/A;  . [place]   rt and lt  . TONSILLECTOMY    . TOTAL KNEE ARTHROPLASTY Left 07/19/2016   Procedure: LEFT TOTAL KNEE ARTHROPLASTY CORTISON INJECTION IN RIGHT KNEE;  Surgeon: Jessy Oto, MD;  Location: [place];  Service: Orthopedics;  Laterality: Left;  MAY NEED RNFA TO FINISH CASE PER [place]   . TOTAL KNEE ARTHROPLASTY Right 04/11/2017   Procedure: RIGHT TOTAL KNEE ARTHROPLASTY;  Surgeon: Jessy Oto, MD;  Location: [place];  Service: Orthopedics;  Laterality: Right;  . WRIST SURGERY     lt and rt    There were no vitals filed for this visit.  Subjective Assessment - 07/02/17 1026    Subjective  I want to work my knee.  Shoulder is OK. Still no brace. CT tomorrow.      Currently in Pain?  Yes    Pain Score  2  no wgt     Pain Location  Knee    Pain Orientation  Right    Pain Descriptors / Indicators  Aching;Tightness    Pain Type  Chronic pain    Pain Onset  More than  a month ago    Pain Frequency  Intermittent    Aggravating Factors   walking     Pain Relieving Factors  manual , PT , stretching          [facility] PT Assessment - 07/02/17 0001      AROM   Right Knee Extension  14 AROM     Right Knee Flexion  120           [facility] Adult PT Treatment/Exercise - 07/02/17 0001      Knee/Hip Exercises: Stretches   Hip Flexor Stretch  Right;2 reps;60 seconds      Knee/Hip Exercises: Aerobic   Stationary Bike  5 min L3       Knee/Hip Exercises: Standing   Forward Lunges  Both;1 set;10 reps alternating     Functional Squat  1 set;15 reps    Other Standing Knee Exercises  adductor stretch standing x 5 each side     Other Standing Knee Exercises  standing hip hinge x 10 with UE support       Manual Therapy   Joint Mobilization  grade II-III for knee extension., used belt for joint  distraction    Soft tissue mobilization  instrument assist distal quads and lateral hamstrings to soften  done in supine hip flexor stretch position     Passive ROM  extension, flexion               PT Short Term Goals - 07/02/17 1052      PT SHORT TERM GOAL #1   Title  Pt will be I with initial HEP for ROM and strength in R knee     Status  Achieved      PT SHORT TERM GOAL #2   Title  Pt will be able to go without compression stocking for up to 3 hours and manage edema     Baseline  wears stocking  all the time per MD order     Status  Deferred      PT SHORT TERM GOAL #3   Title  Pt will achieve no more than 10 deg knee extension for improved gait, flexibility.     Status  On-going        PT Long Term Goals - 07/02/17 1053      PT LONG TERM GOAL #1   Title  Pt will understand RICE, posture and body mechanics as it pertains to knee and back pain.     Status  On-going      PT LONG TERM GOAL #2   Title  Pt will be I with more advanced HEP for knee, core.     Status  On-going      PT LONG TERM GOAL #3   Title  Pt will less than 60% limited on FOTO to demo functional improvement            Plan - 07/02/17 1252    Clinical Impression Statement  Pt will benefit from continued PT for Rt. knee stifness and pain.  He is hoping brace will arrive in the next 12 days.  Lt knee CT scan tomorrow.  Back was burning a bit today, relates to PIlates tower exercises, relieved with Rt. LE long axis distraction and Rt.,hip flexor stretch.     PT Next Visit Plan  cont strengthening , ROM within pain limits.  modalties.   ROM knee/  shoulder (DC shoulder at this time)     PT Home Exercise  Plan  hamstring stretch, quad set, LAQ (has full HEP from previous episode but told to focus on knee ext ) , wall slide for quads //////supine scapular stability with red band shoulder ER red band and flexion with wand, standing  Shoulder doorway stretch,  shoulder isometrics.  step ups  .     Consulted and Agree with Plan of Care  Patient       Patient will benefit from skilled therapeutic intervention in order to improve the following deficits and impairments:  Abnormal gait, Decreased mobility, Difficulty walking, Impaired sensation, Improper body mechanics, Decreased scar mobility, Decreased range of motion, Decreased activity tolerance, Decreased strength, Increased fascial restricitons, Impaired flexibility, Pain, Increased edema, Impaired UE functional use  Visit Diagnosis: Muscle weakness (generalized)  Localized edema  Difficulty in walking, not elsewhere classified  Acute pain of right knee     Problem List Patient Active Problem List   Diagnosis Date Noted  . Closed fracture of right ankle   . Cellulitis of right leg 05/03/2017  . Hypokalemia 05/03/2017  . Hypertension 05/03/2017  . GERD (gastroesophageal reflux disease) 05/03/2017  . Anxiety 05/03/2017  . S/P total knee replacement using cement, right 04/11/2017  . Status post total left knee replacement 09/26/2016  . Presence of left artificial knee joint 07/25/2016  . Pain and swelling of left lower leg 07/25/2016  . Bilateral primary osteoarthritis of knee 07/19/2016    Class: Chronic  . Unilateral primary osteoarthritis, right knee 07/19/2016  . 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 Hypotestosteronemia 09/08/2015  . Cervical spondylosis without myelopathy 03/25/2014    Class: Chronic  . Left cervical radiculopathy 03/25/2014    Class: Chronic  . Cervical spondylosis with radiculopathy 03/25/2014    Deaunte Dente 07/02/2017, 12:57 PM  [place], [place], [address] Phone: [phone]   Fax:  [phone]  Name: AKHILESH SASSONE MRN: [id number] Date of Birth: [birth year]   Raeford Razor, PT 07/02/17 12:57 PM Phone: [phone] Fax: [phone]

## 2017-07-03 ENCOUNTER — Ambulatory Visit
Admission: RE | Admit: 2017-07-03 | Discharge: 2017-07-03 | Disposition: A | Discharge status: Home / Self Care | Payer: BLUE CROSS/BLUE SHIELD | Source: Ambulatory Visit | Attending: Specialist | Admitting: Specialist

## 2017-07-03 DIAGNOSIS — M25562 Pain in left knee: Secondary | ICD-10-CM

## 2017-07-07 ENCOUNTER — Ambulatory Visit: Payer: BLUE CROSS/BLUE SHIELD | Attending: Specialist | Admitting: Physical Therapy

## 2017-07-07 ENCOUNTER — Encounter: Payer: Self-pay | Admitting: Physical Therapy

## 2017-07-07 DIAGNOSIS — M25612 Stiffness of left shoulder, not elsewhere classified: Secondary | ICD-10-CM | POA: Insufficient documentation

## 2017-07-07 DIAGNOSIS — M25561 Pain in right knee: Secondary | ICD-10-CM | POA: Diagnosis present

## 2017-07-07 DIAGNOSIS — M25512 Pain in left shoulder: Secondary | ICD-10-CM | POA: Insufficient documentation

## 2017-07-07 DIAGNOSIS — M25562 Pain in left knee: Secondary | ICD-10-CM | POA: Diagnosis present

## 2017-07-07 DIAGNOSIS — R600 Localized edema: Secondary | ICD-10-CM | POA: Diagnosis present

## 2017-07-07 DIAGNOSIS — M25662 Stiffness of left knee, not elsewhere classified: Secondary | ICD-10-CM | POA: Diagnosis present

## 2017-07-07 DIAGNOSIS — M6281 Muscle weakness (generalized): Secondary | ICD-10-CM | POA: Insufficient documentation

## 2017-07-07 DIAGNOSIS — R262 Difficulty in walking, not elsewhere classified: Secondary | ICD-10-CM | POA: Insufficient documentation

## 2017-07-07 NOTE — Therapy (Addendum)
[place], [place], [address] Phone: [phone]   Fax:  [phone]  Physical Therapy Treatment (Knee)  and Discharge (shoulder)   Patient Details  Name: Jesus Brown MRN: [id number] Date of Birth: Dec 14, 1965 Referring Provider: Dr. Corinna Capra   Encounter Date: 07/07/2017  PT End of Session - 07/07/17 1136    Visit Number  3 for shoulder     Number of Visits     Date for PT Re-Evaluation  07/30/17    PT Start Time 1100   PT Stop Time  1145    PT Time Calculation (min)  40 min    Activity Tolerance  Patient tolerated treatment well    Behavior During Therapy [facility]       Past Medical History:  Diagnosis Date  . Anxiety    mainly r/t pain  . Arthritis    neck, knees  . Asthma    AS CHILD   . Back pain, chronic   . DDD (degenerative disc disease)   . Dysphagia   . 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 GERD (gastroesophageal reflux disease)   . Hypertension   . Hypotestosteronemia 09/08/2015  . Neck pain, chronic     Past Surgical History:  Procedure Laterality Date  . ANKLE SURGERY    . BACK SURGERY  2010,2011   lumb fusionx2  . CERVICAL FUSION  0272,5366   3 surgeries  . DIRECT LARYNGOSCOPY N/A 11/09/2012   Procedure: DIRECT LARYNGOSCOPY;  Surgeon: Jodi Marble, MD;  Location: [place];  Service: ENT;  Laterality: N/A;  . ESOPHAGOGASTRODUODENOSCOPY N/A 08/03/2012   Procedure: ESOPHAGOGASTRODUODENOSCOPY (EGD);  Surgeon: Arta Silence, MD;  [facility] ENDOSCOPY;  Service: Endoscopy;  Laterality: N/A;  . ESOPHAGOSCOPY W/ BOTOX INJECTION N/A 11/09/2012   Procedure: ESOPHAGOSCOPY POSSIBLE BIOPSY AND POSSIBLE BOTOX INJECTION CRICOPHARYNGEUS;  Surgeon: Jodi Marble, MD;  Location: [place];  Service: ENT;  Laterality: N/A;  Esophagoscopy with botox injection  . HERNIA REPAIR     ing-rt  . KNEE SURGERY     rt and left x2 each=4  . POSTERIOR CERVICAL  FUSION/FORAMINOTOMY N/A 03/25/2014   Procedure: LEFT C7-T1 FORAMINOTOMY;  Surgeon: Jessy Oto, MD;  Location: [place];  Service: Orthopedics;  Laterality: N/A;  . [place]   rt and lt  . TONSILLECTOMY    . TOTAL KNEE ARTHROPLASTY Left 07/19/2016   Procedure: LEFT TOTAL KNEE ARTHROPLASTY CORTISON INJECTION IN RIGHT KNEE;  Surgeon: Jessy Oto, MD;  Location: [place];  Service: Orthopedics;  Laterality: Left;  MAY NEED RNFA TO FINISH CASE PER [place]   . TOTAL KNEE ARTHROPLASTY Right 04/11/2017   Procedure: RIGHT TOTAL KNEE ARTHROPLASTY;  Surgeon: Jessy Oto, MD;  Location: [place];  Service: Orthopedics;  Laterality: Right;  . WRIST SURGERY     lt and rt    There were no vitals filed for this visit.  Subjective Assessment - 07/07/17 1023    Subjective  No results of CT yet,  posterior knee pain improving.  knee is still getting hot.    Currently in Pain?  Yes    Pain Score  4     Pain Location  Knee    Pain Orientation  Right    Pain Descriptors / Indicators  Aching;Tightness    Pain Type  Chronic pain    Pain Radiating Towards  distal quads    Pain Frequency  Intermittent  Aggravating Factors   walking    Pain Relieving Factors  stocking,,manual,  stretching,  pillow    Effect of Pain on Daily Activities  hard to fall asleep,     Multiple Pain Sites  No         [facility] PT Assessment - 07/07/17 0001      AROM   Left Shoulder Flexion  134 Degrees scaption, 102 flexion     Left Shoulder Internal Rotation  -- FR to T8    Left Shoulder External Rotation  -- FR to T1 pain when returning to neutral       Strength   Left Shoulder Flexion  4/5    Left Shoulder ABduction  4/5          [facility] Adult PT Treatment/Exercise - 07/07/17 0001                                                      Shoulder Exercises: Supine   Horizontal ABduction  Strengthening;Both;10 reps;Theraband    Theraband Level (Shoulder Horizontal ABduction)  Level 3 (Green)     External Rotation  Strengthening;Both;15 reps    Theraband Level (Shoulder External Rotation)  Level 2 (Red);Level 3 (Green)    Flexion  Strengthening;Both;10 reps    Other Supine Exercises  supine scap stab ex with green band x 10       Shoulder Exercises: Standing   Horizontal ABduction  Strengthening;Both;12 reps    Theraband Level (Shoulder Horizontal ABduction)  Level 3 (Green)    External Rotation  Strengthening;Both;12 reps    Theraband Level (Shoulder External Rotation)  Level 3 (Green)    Row  Strengthening;Both;15 reps;Theraband    Theraband Level (Shoulder Row)  Level 3 Nyoka Cowden)               PT Education - 07/07/17 1135    Education provided  Yes    Education Details  DC shoulder and HEP reissued , importance of scapular stability     Person(s) Educated  Patient    Methods  Explanation;Handout;Verbal cues    Comprehension  Verbalized understanding;Returned demonstration       PT Short Term Goals - 07/02/17 1052      PT SHORT TERM GOAL #1   Title  Pt will be I with initial HEP for ROM and strength in R knee     Status  Achieved      PT SHORT TERM GOAL #2   Title  Pt will be able to go without compression stocking for up to 3 hours and manage edema     Baseline  wears stocking  all the time per MD order     Status  Deferred      PT SHORT TERM GOAL #3   Title  Pt will achieve no more than 10 deg knee extension for improved gait, flexibility.     Status  On-going        PT Long Term Goals - 07/07/17 1113      PT LONG TERM GOAL #6   Title  Pt will be able to complete ADLs, reaching in all directions without increase in shoulder pain (donning/doffing shirt)     Baseline  pain in shoulder about every 3 - 4 days, more intermittent     Status  Achieved  PT LONG TERM GOAL #7   Title  Pt will demo in LUE to 4+/5 in flexion, abduction for maximal functional use of L UE     Baseline  4/5     Status  Partially Met            Plan - 07/07/17 1138     Clinical Impression Statement Discharge shoulder as it ha not been an issue for him.  Has continued pain when donning and doffing shirt.  Would like to focus on knee.    PT Next Visit Plan  cont strengthening , ROM within pain limits.  modalties.   ROM knee/  shoulder (DC shoulder at this time)     PT Home Exercise Plan  hamstring stretch, quad set, LAQ (has full HEP from previous episode but told to focus on knee ext ) , wall slide for quads //////supine scapular stability with red band shoulder ER red band and flexion with wand, standing  Shoulder doorway stretch,  shoulder isometrics.  step ups  .    Consulted and Agree with Plan of Care  Patient       Patient will benefit from skilled therapeutic intervention in order to improve the following deficits and impairments:  Abnormal gait, Decreased mobility, Difficulty walking, Impaired sensation, Improper body mechanics, Decreased scar mobility, Decreased range of motion, Decreased activity tolerance, Decreased strength, Increased fascial restricitons, Impaired flexibility, Pain, Increased edema, Impaired UE functional use  Visit Diagnosis: Stiffness of left shoulder, not elsewhere classified  Acute pain of left shoulder     Problem List Patient Active Problem List   Diagnosis Date Noted  . Closed fracture of right ankle   . Cellulitis of right leg 05/03/2017  . Hypokalemia 05/03/2017  . Hypertension 05/03/2017  . GERD (gastroesophageal reflux disease) 05/03/2017  . Anxiety 05/03/2017  . S/P total knee replacement using cement, right 04/11/2017  . Status post total left knee replacement 09/26/2016  . Presence of left artificial knee joint 07/25/2016  . Pain and swelling of left lower leg 07/25/2016  . Bilateral primary osteoarthritis of knee 07/19/2016    Class: Chronic  . Unilateral primary osteoarthritis, right knee 07/19/2016  . 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 Hypotestosteronemia 09/08/2015  . Cervical  spondylosis without myelopathy 03/25/2014    Class: Chronic  . Left cervical radiculopathy 03/25/2014    Class: Chronic  . Cervical spondylosis with radiculopathy 03/25/2014    Oline Belk 07/07/2017, 11:39 AM  [facility] [address] [place], [place], [address] Phone: [phone]   Fax:  [phone]  Name: VISHRUTH SEOANE MRN: [id number] Date of Birth: 1965-09-25  PHYSICAL THERAPY DISCHARGE SUMMARY  Visits from Start of Care: 3  Current functional level related to goals / functional outcomes: Back to baseline per patient    Remaining deficits: Pain with certain motions, strength 4/5 L UE throughout.    Education / Equipment: HEP, anatomical considerations for avoiding pain, RICE  Plan: Patient agrees to discharge.  Patient goals were partially met. Patient is being discharged due to the patient's request.  ?????   Shoulder discharged.    Raeford Razor, PT 07/07/17 11:41 AM Phone: [phone] Fax: [phone]   Raeford Razor, PT 07/22/17 1:35 PM Phone: [phone] Fax: [phone]

## 2017-07-07 NOTE — Therapy (Addendum)
[place], [place], [address] Phone: [phone]   Fax:  [phone]  Physical Therapy Treatment  Patient Details  Name: Jesus Brown MRN: [id number] Date of Birth: 07-11-1965 Referring Provider: Dr. Corinna Capra   Encounter Date: 07/07/2017  PT End of Session - 07/07/17 1136    Visit Number  15      Number of Visits  20    Date for PT Re-Evaluation  07/30/17    PT Start Time  9326    PT Stop Time  1100 .    PT Time Calculation (min)  40 min    Activity Tolerance  Patient tolerated treatment well    Behavior During Therapy  WFL for tasks assessed/performed       Past Medical History:  Diagnosis Date  . Anxiety    mainly r/t pain  . Arthritis    neck, knees  . Asthma    AS CHILD   . Back pain, chronic   . DDD (degenerative disc disease)   . Dysphagia   . 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 GERD (gastroesophageal reflux disease)   . Hypertension   . Hypotestosteronemia 09/08/2015  . Neck pain, chronic     Past Surgical History:  Procedure Laterality Date  . ANKLE SURGERY    . BACK SURGERY  2010,2011   lumb fusionx2  . CERVICAL FUSION  7124,5809   3 surgeries  . DIRECT LARYNGOSCOPY N/A 11/09/2012   Procedure: DIRECT LARYNGOSCOPY;  Surgeon: Jodi Marble, MD;  Location: [place];  Service: ENT;  Laterality: N/A;  . ESOPHAGOGASTRODUODENOSCOPY N/A 08/03/2012   Procedure: ESOPHAGOGASTRODUODENOSCOPY (EGD);  Surgeon: Arta Silence, MD;  Location: [facility] ENDOSCOPY;  Service: Endoscopy;  Laterality: N/A;  . ESOPHAGOSCOPY W/ BOTOX INJECTION N/A 11/09/2012   Procedure: ESOPHAGOSCOPY POSSIBLE BIOPSY AND POSSIBLE BOTOX INJECTION CRICOPHARYNGEUS;  Surgeon: Jodi Marble, MD;  Location: [place];  Service: ENT;  Laterality: N/A;  Esophagoscopy with botox injection  . HERNIA REPAIR     ing-rt  . KNEE SURGERY     rt and left x2 each=4  . POSTERIOR CERVICAL  FUSION/FORAMINOTOMY N/A 03/25/2014   Procedure: LEFT C7-T1 FORAMINOTOMY;  Surgeon: Jessy Oto, MD;  Location: [place];  Service: Orthopedics;  Laterality: N/A;  . [place]   rt and lt  . TONSILLECTOMY    . TOTAL KNEE ARTHROPLASTY Left 07/19/2016   Procedure: LEFT TOTAL KNEE ARTHROPLASTY CORTISON INJECTION IN RIGHT KNEE;  Surgeon: Jessy Oto, MD;  Location: [place];  Service: Orthopedics;  Laterality: Left;  MAY NEED RNFA TO FINISH CASE PER [place]   . TOTAL KNEE ARTHROPLASTY Right 04/11/2017   Procedure: RIGHT TOTAL KNEE ARTHROPLASTY;  Surgeon: Jessy Oto, MD;  Location: [place];  Service: Orthopedics;  Laterality: Right;  . WRIST SURGERY     lt and rt    There were no vitals filed for this visit.  Subjective Assessment - 07/07/17 1023    Subjective  No results of CT yet,  posterior knee pain improving.  knee is still getting hot.    Currently in Pain?  Yes    Pain Score  4     Pain Location  Knee    Pain Orientation  Right    Pain Descriptors / Indicators  Aching;Tightness    Pain Type  Chronic pain    Pain Radiating Towards  distal quads    Pain Frequency  Intermittent  Aggravating Factors   walking    Pain Relieving Factors  stocking,,manual,  stretching,  pillow    Effect of Pain on Daily Activities  hard to fall asleep,     Multiple Pain Sites  No        [facility] Adult PT Treatment/Exercise - 07/07/17 0001      Knee/Hip Exercises: Stretches   Passive Hamstring Stretch  3 reps;30 seconds    Quad Stretch  3 reps;30 seconds    Hip Flexor Stretch  3 reps;30 seconds    Gastroc Stretch  3 reps;30 seconds      Knee/Hip Exercises: Aerobic   Stationary Bike  prior to PT session      Knee/Hip Exercises: Standing   Terminal Knee Extension Limitations  10 reps pressing ball to wall      Knee/Hip Exercises: Supine   Quad Sets  -- multiple sets                                                                Manual Therapy   Manual Therapy  Soft  tissue mobilization;Passive ROM    Soft tissue mobilization  instrument assist distal quads hamstrings     Passive ROM  extension, flexion             PT Education - 07/07/17 1135    Education provided  Yes    Education Details  DC shoulder and HEP reissued , importance of scapular stability     Person(s) Educated  Patient    Methods  Explanation;Handout;Verbal cues    Comprehension  Verbalized understanding;Returned demonstration       PT Short Term Goals - 07/07/17 1139      PT SHORT TERM GOAL #1   Title  Pt will be I with initial HEP for ROM and strength in R knee     Time  4    Period  Weeks    Status  Achieved      PT SHORT TERM GOAL #2   Title  Pt will be able to go without compression stocking for up to 3 hours and manage edema     Baseline  wears stocking during the day    Time  4    Period  Weeks    Status  Deferred      PT SHORT TERM GOAL #3   Title  Pt will achieve no more than 10 deg knee extension for improved gait, flexibility.     Baseline  -14 passive    Time  4    Period  Weeks    Status  On-going        PT Long Term Goals - 07/07/17 1113      PT LONG TERM GOAL #6   Title  Pt will be able to complete ADLs, reaching in all directions without increase in shoulder pain (donning/doffing shirt)     Baseline  pain in shoulder about every 3 - 4 days, more intermittent     Status  Achieved      PT LONG TERM GOAL #7   Title  Pt will demo in LUE to 4+/5 in flexion, abduction for maximal functional use of L UE     Baseline  4/5     Status  Partially Met            Plan - 07/07/17 1138    Clinical Impression Statement  Focus on knee extension today.  Manual and stretching helpful with stiffness.  Extension continues to be a challange.    PT Next Visit Plan  cont strengthening , ROM within pain limits.  modalties.   ROM knee/  shoulder (DC shoulder at this time)     PT Home Exercise Plan  hamstring stretch, quad set, LAQ (has full HEP from  previous episode but told to focus on knee ext ) , wall slide for quads //////supine scapular stability with red band shoulder ER red band and flexion with wand, standing  Shoulder doorway stretch,  shoulder isometrics.  step ups  .    Consulted and Agree with Plan of Care  Patient       Patient will benefit from skilled therapeutic intervention in order to improve the following deficits and impairments:  Abnormal gait, Decreased mobility, Difficulty walking, Impaired sensation, Improper body mechanics, Decreased scar mobility, Decreased range of motion, Decreased activity tolerance, Decreased strength, Increased fascial restricitons, Impaired flexibility, Pain, Increased edema, Impaired UE functional use  Visit Diagnosis: Stiffness of left shoulder, not elsewhere classified  Acute pain of left shoulder     Problem List Patient Active Problem List   Diagnosis Date Noted  . Closed fracture of right ankle   . Cellulitis of right leg 05/03/2017  . Hypokalemia 05/03/2017  . Hypertension 05/03/2017  . GERD (gastroesophageal reflux disease) 05/03/2017  . Anxiety 05/03/2017  . S/P total knee replacement using cement, right 04/11/2017  . Status post total left knee replacement 09/26/2016  . Presence of left artificial knee joint 07/25/2016  . Pain and swelling of left lower leg 07/25/2016  . Bilateral primary osteoarthritis of knee 07/19/2016    Class: Chronic  . Unilateral primary osteoarthritis, right knee 07/19/2016  . 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 Hypotestosteronemia 09/08/2015  . Cervical spondylosis without myelopathy 03/25/2014    Class: Chronic  . Left cervical radiculopathy 03/25/2014    Class: Chronic  . Cervical spondylosis with radiculopathy 03/25/2014    Jesus Brown PTA 07/07/2017, 12:55 PM  [place], [place], [address] Phone: [phone]   Fax:  [phone]  Name:  Jesus Brown MRN: [id number] Date of Birth: 09/17/1965  Raeford Razor, PT 07/22/17 2:19 PM Phone: [phone] Fax: [phone]

## 2017-07-09 ENCOUNTER — Encounter: Payer: Self-pay | Admitting: Physical Therapy

## 2017-07-09 ENCOUNTER — Ambulatory Visit: Payer: BLUE CROSS/BLUE SHIELD | Admitting: Physical Therapy

## 2017-07-09 DIAGNOSIS — R262 Difficulty in walking, not elsewhere classified: Secondary | ICD-10-CM

## 2017-07-09 DIAGNOSIS — R600 Localized edema: Secondary | ICD-10-CM

## 2017-07-09 DIAGNOSIS — M25561 Pain in right knee: Secondary | ICD-10-CM

## 2017-07-09 DIAGNOSIS — M25612 Stiffness of left shoulder, not elsewhere classified: Secondary | ICD-10-CM | POA: Diagnosis not present

## 2017-07-09 NOTE — Therapy (Signed)
[place], [place], [address] Phone: [phone]   Fax:  [phone]  Physical Therapy Treatment  Patient Details  Name: Jesus Brown MRN: [id number] Date of Birth: Apr 08, 1966 Referring Provider: Dr. Corinna Capra   Encounter Date: 07/09/2017  PT End of Session - 07/09/17 1257    Visit Number  16    Number of Visits  20    Date for PT Re-Evaluation  07/30/17    PT Start Time  1017    PT Stop Time  1103    PT Time Calculation (min)  46 min    Activity Tolerance  Patient tolerated treatment well    Behavior During Therapy  [facility] for tasks assessed/performed       Past Medical History:  Diagnosis Date  . Anxiety    mainly r/t pain  . Arthritis    neck, knees  . Asthma    AS CHILD   . Back pain, chronic   . DDD (degenerative disc disease)   . Dysphagia   . 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 GERD (gastroesophageal reflux disease)   . Hypertension   . Hypotestosteronemia 09/08/2015  . Neck pain, chronic     Past Surgical History:  Procedure Laterality Date  . ANKLE SURGERY    . BACK SURGERY  2010,2011   lumb fusionx2  . CERVICAL FUSION  2774,1287   3 surgeries  . DIRECT LARYNGOSCOPY N/A 11/09/2012   Procedure: DIRECT LARYNGOSCOPY;  Surgeon: Jodi Marble, MD;  Location: [place];  Service: ENT;  Laterality: N/A;  . ESOPHAGOGASTRODUODENOSCOPY N/A 08/03/2012   Procedure: ESOPHAGOGASTRODUODENOSCOPY (EGD);  Surgeon: Arta Silence, MD;  Location[facility] ENDOSCOPY;  Service: Endoscopy;  Laterality: N/A;  . ESOPHAGOSCOPY W/ BOTOX INJECTION N/A 11/09/2012   Procedure: ESOPHAGOSCOPY POSSIBLE BIOPSY AND POSSIBLE BOTOX INJECTION CRICOPHARYNGEUS;  Surgeon: Jodi Marble, MD;  Location: [place];  Service: ENT;  Laterality: N/A;  Esophagoscopy with botox injection  . HERNIA REPAIR     ing-rt  . KNEE SURGERY     rt and left x2 each=4  . POSTERIOR CERVICAL  FUSION/FORAMINOTOMY N/A 03/25/2014   Procedure: LEFT C7-T1 FORAMINOTOMY;  Surgeon: Jessy Oto, MD;  Location: [place];  Service: Orthopedics;  Laterality: N/A;  . [place]   rt and lt  . TONSILLECTOMY    . TOTAL KNEE ARTHROPLASTY Left 07/19/2016   Procedure: LEFT TOTAL KNEE ARTHROPLASTY CORTISON INJECTION IN RIGHT KNEE;  Surgeon: Jessy Oto, MD;  Location: [place];  Service: Orthopedics;  Laterality: Left;  MAY NEED RNFA TO FINISH CASE PER [place]   . TOTAL KNEE ARTHROPLASTY Right 04/11/2017   Procedure: RIGHT TOTAL KNEE ARTHROPLASTY;  Surgeon: Jessy Oto, MD;  Location: [place];  Service: Orthopedics;  Laterality: Right;  . WRIST SURGERY     lt and rt    There were no vitals filed for this visit.  Subjective Assessment - 07/09/17 1223    Subjective  I get the brace tomorrow.  When I walk in the grocery store I have to go sit down due to the other knee.    Currently in Pain?  Yes    Pain Score  4  other knee up to 7/10    Pain Location  Knee    Pain Orientation  Right    Pain Descriptors / Indicators  Aching;Tightness    Pain Type  Chronic pain    Pain Radiating Towards  Distal quads    Pain Frequency  Intermittent    Aggravating Factors   walking,  sitting too long then getting up    Pain Relieving Factors  tape     Effect of Pain on Daily Activities  rests as needed,  hard to fall asleep    Multiple Pain Sites  -- left knee up to 7/10 with walking                       [facility] Adult PT Treatment/Exercise - 07/09/17 0001      Knee/Hip Exercises: Stretches   Passive Hamstring Stretch  3 reps;30 seconds    Quad Stretch  3 reps;30 seconds with manual strumming    Gastroc Stretch  3 reps;30 seconds incline      Knee/Hip Exercises: Aerobic   Stationary Bike  prior to PT session      Knee/Hip Exercises: Standing   Terminal Knee Extension Limitations  3 sets , green band leg behind, in front and equal to left      Manual Therapy   Manual Therapy   Taping    Manual therapy comments  patellar mobs,  patella stiff initially.    Soft tissue mobilization  instrument assist distal quads ,    Kinesiotex  Edema;Inhibit Muscle;Facilitate Muscle      Kinesiotix   Edema  medial/lat knee    Inhibit Muscle   anterior tib    Facilitate Muscle   Quads             PT Education - 07/09/17 1256    Education provided  Yes    Education Details  Continue to work on edema, .  Ues brace as directed.    Person(s) Educated  Patient    Methods  Verbal cues    Comprehension  Verbalized understanding       PT Short Term Goals - 07/07/17 1139      PT SHORT TERM GOAL #1   Title  Pt will be I with initial HEP for ROM and strength in R knee     Time  4    Period  Weeks    Status  Achieved      PT SHORT TERM GOAL #2   Title  Pt will be able to go without compression stocking for up to 3 hours and manage edema     Baseline  wears stocking during the day    Time  4    Period  Weeks    Status  Deferred      PT SHORT TERM GOAL #3   Title  Pt will achieve no more than 10 deg knee extension for improved gait, flexibility.     Baseline  -14 passive    Time  4    Period  Weeks    Status  On-going        PT Long Term Goals - 07/07/17 1113      PT LONG TERM GOAL #6   Title  Pt will be able to complete ADLs, reaching in all directions without increase in shoulder pain (donning/doffing shirt)     Baseline  pain in shoulder about every 3 - 4 days, more intermittent     Status  Achieved      PT LONG TERM GOAL #7   Title  Pt will demo in LUE to 4+/5 in flexion, abduction for maximal functional use of L UE     Baseline  4/5  Status  Partially Met            Plan - 07/09/17 1258    Clinical Impression Statement  Patient continues to have extension limitations.  He is scheduled to get the JAS tomorrow.  Tape today at patient request.  No new functional changes.    PT Next Visit Plan  See how JAS is going.  patient thinks he will only  need 1 or 2 more visits for hois knee.    PT Home Exercise Plan  hamstring stretch, quad set, LAQ (has full HEP from previous episode but told to focus on knee ext ) , wall slide for quads //////supine scapular stability with red band shoulder ER red band and flexion with wand, standing  Shoulder doorway stretch,  shoulder isometrics.  step ups  .    Consulted and Agree with Plan of Care  Patient       Patient will benefit from skilled therapeutic intervention in order to improve the following deficits and impairments:     Visit Diagnosis: Localized edema  Difficulty in walking, not elsewhere classified  Acute pain of right knee     Problem List Patient Active Problem List   Diagnosis Date Noted  . Closed fracture of right ankle   . Cellulitis of right leg 05/03/2017  . Hypokalemia 05/03/2017  . Hypertension 05/03/2017  . GERD (gastroesophageal reflux disease) 05/03/2017  . Anxiety 05/03/2017  . S/P total knee replacement using cement, right 04/11/2017  . Status post total left knee replacement 09/26/2016  . Presence of left artificial knee joint 07/25/2016  . Pain and swelling of left lower leg 07/25/2016  . Bilateral primary osteoarthritis of knee 07/19/2016    Class: Chronic  . Unilateral primary osteoarthritis, right knee 07/19/2016  . 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 Hypotestosteronemia 09/08/2015  . Cervical spondylosis without myelopathy 03/25/2014    Class: Chronic  . Left cervical radiculopathy 03/25/2014    Class: Chronic  . Cervical spondylosis with radiculopathy 03/25/2014    Alencia Gordon PTA 07/09/2017, 1:15 PM  [place], [place], [address] Phone: [phone]   Fax:  [phone]  Name: JONATHEN RATHMAN MRN: [id number] Date of Birth: March 21, 1966

## 2017-07-14 ENCOUNTER — Ambulatory Visit: Payer: BLUE CROSS/BLUE SHIELD | Admitting: Physical Therapy

## 2017-07-14 ENCOUNTER — Encounter: Payer: Self-pay | Admitting: Physical Therapy

## 2017-07-14 ENCOUNTER — Encounter: Payer: BLUE CROSS/BLUE SHIELD | Admitting: Physical Therapy

## 2017-07-14 DIAGNOSIS — R262 Difficulty in walking, not elsewhere classified: Secondary | ICD-10-CM

## 2017-07-14 DIAGNOSIS — R600 Localized edema: Secondary | ICD-10-CM

## 2017-07-14 DIAGNOSIS — M25561 Pain in right knee: Secondary | ICD-10-CM

## 2017-07-14 DIAGNOSIS — M25612 Stiffness of left shoulder, not elsewhere classified: Secondary | ICD-10-CM | POA: Diagnosis not present

## 2017-07-14 NOTE — Therapy (Signed)
[place], [place], [address] Phone: [phone]   Fax:  [phone]  Physical Therapy Treatment  Patient Details  Name: Jesus Brown MRN: [id number] Date of Birth: 31-Mar-1966 Referring Provider: Dr. Corinna Capra   Encounter Date: 07/14/2017  PT End of Session - 07/14/17 1134    Visit Number  17    Number of Visits  20    PT Start Time  1100    PT Stop Time  1145    PT Time Calculation (min)  45 min    Activity Tolerance  Patient tolerated treatment well    Behavior During Therapy  [facility] for tasks assessed/performed       Past Medical History:  Diagnosis Date  . Anxiety    mainly r/t pain  . Arthritis    neck, knees  . Asthma    AS CHILD   . Back pain, chronic   . DDD (degenerative disc disease)   . Dysphagia   . 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 GERD (gastroesophageal reflux disease)   . Hypertension   . Hypotestosteronemia 09/08/2015  . Neck pain, chronic     Past Surgical History:  Procedure Laterality Date  . ANKLE SURGERY    . BACK SURGERY  2010,2011   lumb fusionx2  . CERVICAL FUSION  2979,8921   3 surgeries  . DIRECT LARYNGOSCOPY N/A 11/09/2012   Procedure: DIRECT LARYNGOSCOPY;  Surgeon: Jodi Marble, MD;  Location: [place];  Service: ENT;  Laterality: N/A;  . ESOPHAGOGASTRODUODENOSCOPY N/A 08/03/2012   Procedure: ESOPHAGOGASTRODUODENOSCOPY (EGD);  Surgeon: Arta Silence, MD;  Location: [facility] ENDOSCOPY;  Service: Endoscopy;  Laterality: N/A;  . ESOPHAGOSCOPY W/ BOTOX INJECTION N/A 11/09/2012   Procedure: ESOPHAGOSCOPY POSSIBLE BIOPSY AND POSSIBLE BOTOX INJECTION CRICOPHARYNGEUS;  Surgeon: Jodi Marble, MD;  Location: [place];  Service: ENT;  Laterality: N/A;  Esophagoscopy with botox injection  . HERNIA REPAIR     ing-rt  . KNEE SURGERY     rt and left x2 each=4  . POSTERIOR CERVICAL FUSION/FORAMINOTOMY N/A 03/25/2014   Procedure:  LEFT C7-T1 FORAMINOTOMY;  Surgeon: Jessy Oto, MD;  Location: [place];  Service: Orthopedics;  Laterality: N/A;  . [place]   rt and lt  . TONSILLECTOMY    . TOTAL KNEE ARTHROPLASTY Left 07/19/2016   Procedure: LEFT TOTAL KNEE ARTHROPLASTY CORTISON INJECTION IN RIGHT KNEE;  Surgeon: Jessy Oto, MD;  Location: [place];  Service: Orthopedics;  Laterality: Left;  MAY NEED RNFA TO FINISH CASE PER [place]   . TOTAL KNEE ARTHROPLASTY Right 04/11/2017   Procedure: RIGHT TOTAL KNEE ARTHROPLASTY;  Surgeon: Jessy Oto, MD;  Location: [place];  Service: Orthopedics;  Laterality: Right;  . WRIST SURGERY     lt and rt    There were no vitals filed for this visit.  Subjective Assessment - 07/14/17 1054    Subjective  Got the brace and I use 3 times per day. I fell 3 times this weekend.  My back is burning. Legs ache above knees.  They get weak and they collapse.      Currently in Pain?  Yes    Pain Score  2     Pain Location  Knee    Pain Orientation  Right    Pain Descriptors / Indicators  Aching    Pain Score  6    Pain Location  Back    Pain Orientation  Right;Lower         [facility] PT Assessment - 07/14/17 0001      AROM   Right Knee Extension  12          [facility] Adult PT Treatment/Exercise - 07/14/17 0001      Knee/Hip Exercises: Stretches   Active Hamstring Stretch  Right;3 reps    Other Knee/Hip Stretches  contract relax x 5, 10 sec       Knee/Hip Exercises: Aerobic   Stationary Bike  prior to PT session      Knee/Hip Exercises: Machines for Strengthening   Cybex Knee Extension  15 lbs x 20 slow eccentric focus     Cybex Leg Press  x 20 with 1 plate,  x 10 with just 1 leg , 2 sets     Other Machine  calf raise and stretching with 1 plate       Manual Therapy   Joint Mobilization  Gr II extension, patellar mobs all directions     Soft tissue mobilization  instrument assist distal quads ,               PT Short Term Goals - 07/07/17 1139      PT  SHORT TERM GOAL #1   Title  Pt will be I with initial HEP for ROM and strength in R knee     Time  4    Period  Weeks    Status  Achieved      PT SHORT TERM GOAL #2   Title  Pt will be able to go without compression stocking for up to 3 hours and manage edema     Baseline  wears stocking during the day    Time  4    Period  Weeks    Status  Deferred      PT SHORT TERM GOAL #3   Title  Pt will achieve no more than 10 deg knee extension for improved gait, flexibility.     Baseline  -14 passive    Time  4    Period  Weeks    Status  On-going        PT Long Term Goals - 07/14/17 1141      PT LONG TERM GOAL #1   Title  Pt will understand RICE, posture and body mechanics as it pertains to knee and back pain.     Status  Achieved      PT LONG TERM GOAL #2   Title  Pt will be I with more advanced HEP for knee, core.     Baseline  up to date     Status  Partially Met      PT LONG TERM GOAL #3   Title  Pt will less than 60% limited on FOTO to demo functional improvement    Status  Unable to assess      PT LONG TERM GOAL #4   Title  Pt will be able to go outside and participate in family functions without limitations of knee pain, including walking on uneven ground.     Baseline  improved , gets flashes of pain in Lt thigh when on uneven ground.     Status  On-going      PT LONG TERM GOAL #5   Title  Pt will be able to walk for 30 min for general health and fitness without lasting knee pain     Status  On-going  Plan - 07/14/17 1256    Clinical Impression Statement  Patient with scar tissue buildup just prox to scar on Rt knee.  Got his JAS and has been using  3 times per day.  Will change to 1 time per week after this week to allow time for maximal home use of JAS.      PT Treatment/Interventions  ADLs/Self Care Home Management;Cryotherapy;Moist Heat;Electrical Stimulation;Therapeutic activities;Therapeutic exercise;Balance training;Patient/family  education;Manual techniques;Passive range of motion;Neuromuscular re-education;Taping;Vasopneumatic Device;Functional mobility training;Gait training;Stair training;DME Instruction;Iontophoresis 1m/ml Dexamethasone;Ultrasound    PT Next Visit Plan  cont ROM, manual, caution with low back.     PT Home Exercise Plan  hamstring stretch, quad set, LAQ (has full HEP from previous episode but told to focus on knee ext ) , wall slide for quads //////supine scapular stability with red band shoulder ER red band and flexion with wand, standing  Shoulder doorway stretch,  shoulder isometrics.  step ups  .    Consulted and Agree with Plan of Care  Patient       Patient will benefit from skilled therapeutic intervention in order to improve the following deficits and impairments:  Abnormal gait, Decreased mobility, Difficulty walking, Impaired sensation, Improper body mechanics, Decreased scar mobility, Decreased range of motion, Decreased activity tolerance, Decreased strength, Increased fascial restricitons, Impaired flexibility, Pain, Increased edema, Impaired UE functional use  Visit Diagnosis: Localized edema  Difficulty in walking, not elsewhere classified  Acute pain of right knee     Problem List Patient Active Problem List   Diagnosis Date Noted  . Closed fracture of right ankle   . Cellulitis of right leg 05/03/2017  . Hypokalemia 05/03/2017  . Hypertension 05/03/2017  . GERD (gastroesophageal reflux disease) 05/03/2017  . Anxiety 05/03/2017  . S/P total knee replacement using cement, right 04/11/2017  . Status post total left knee replacement 09/26/2016  . Presence of left artificial knee joint 07/25/2016  . Pain and swelling of left lower leg 07/25/2016  . Bilateral primary osteoarthritis of knee 07/19/2016    Class: Chronic  . Unilateral primary osteoarthritis, right knee 07/19/2016  . 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 Hypotestosteronemia 09/08/2015  .  Cervical spondylosis without myelopathy 03/25/2014    Class: Chronic  . Left cervical radiculopathy 03/25/2014    Class: Chronic  . Cervical spondylosis with radiculopathy 03/25/2014    Jesus Brown 07/14/2017, 1:04 PM  [facility] [place] [address]Phone: [phone]  Fax:  [phone] Name: Jesus BOZEMRN: [id number] of Birth: [birth year] Jesus Brown PT 07/14/17 1:04 PM Phone: [phone]: [phone]

## 2017-07-16 ENCOUNTER — Ambulatory Visit: Payer: BLUE CROSS/BLUE SHIELD | Admitting: Physical Therapy

## 2017-07-16 ENCOUNTER — Encounter: Payer: Self-pay | Admitting: Physical Therapy

## 2017-07-16 DIAGNOSIS — R600 Localized edema: Secondary | ICD-10-CM

## 2017-07-16 DIAGNOSIS — M25562 Pain in left knee: Secondary | ICD-10-CM

## 2017-07-16 DIAGNOSIS — M25612 Stiffness of left shoulder, not elsewhere classified: Secondary | ICD-10-CM | POA: Diagnosis not present

## 2017-07-16 DIAGNOSIS — R262 Difficulty in walking, not elsewhere classified: Secondary | ICD-10-CM

## 2017-07-16 DIAGNOSIS — M25662 Stiffness of left knee, not elsewhere classified: Secondary | ICD-10-CM

## 2017-07-16 NOTE — Patient Instructions (Signed)
Use a cane for safety.

## 2017-07-16 NOTE — Therapy (Signed)
[place], [place], [address] Phone: [phone]   Fax:  [phone]  Physical Therapy Treatment  Patient Details  Name: Jesus Brown MRN: [id number] Date of Birth: [birth year] Referring Provider: Dr. Corinna Capra   Encounter Date: 07/16/2017  PT End of Session - 07/16/17 1115    Visit Number  18    Number of Visits  20    Date for PT Re-Evaluation  07/30/17    PT Start Time  1022    PT Stop Time  1100    PT Time Calculation (min)  38 min    Activity Tolerance  Patient tolerated treatment well    Behavior During Therapy  [facility] for tasks assessed/performed       Past Medical History:  Diagnosis Date  . Anxiety    mainly r/t pain  . Arthritis    neck, knees  . Asthma    AS CHILD   . Back pain, chronic   . DDD (degenerative disc disease)   . Dysphagia   . 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 GERD (gastroesophageal reflux disease)   . Hypertension   . Hypotestosteronemia 09/08/2015  . Neck pain, chronic     Past Surgical History:  Procedure Laterality Date  . ANKLE SURGERY    . BACK SURGERY  2010,2011   lumb fusionx2  . CERVICAL FUSION  6979,4801   3 surgeries  . DIRECT LARYNGOSCOPY N/A 11/09/2012   Procedure: DIRECT LARYNGOSCOPY;  Surgeon: Jodi Marble, MD;  Location: [place];  Service: ENT;  Laterality: N/A;  . ESOPHAGOGASTRODUODENOSCOPY N/A 08/03/2012   Procedure: ESOPHAGOGASTRODUODENOSCOPY (EGD);  Surgeon: Arta Silence, MD;  Location: [facility] ENDOSCOPY;  Service: Endoscopy;  Laterality: N/A;  . ESOPHAGOSCOPY W/ BOTOX INJECTION N/A 11/09/2012   Procedure: ESOPHAGOSCOPY POSSIBLE BIOPSY AND POSSIBLE BOTOX INJECTION CRICOPHARYNGEUS;  Surgeon: Jodi Marble, MD;  Location: [place];  Service: ENT;  Laterality: N/A;  Esophagoscopy with botox injection  . HERNIA REPAIR     ing-rt  . KNEE SURGERY     rt and left x2 each=4  . POSTERIOR CERVICAL  FUSION/FORAMINOTOMY N/A 03/25/2014   Procedure: LEFT C7-T1 FORAMINOTOMY;  Surgeon: Jessy Oto, MD;  Location: [place];  Service: Orthopedics;  Laterality: N/A;  . [place]   rt and lt  . TONSILLECTOMY    . TOTAL KNEE ARTHROPLASTY Left 07/19/2016   Procedure: LEFT TOTAL KNEE ARTHROPLASTY CORTISON INJECTION IN RIGHT KNEE;  Surgeon: Jessy Oto, MD;  Location: [place];  Service: Orthopedics;  Laterality: Left;  MAY NEED RNFA TO FINISH CASE PER [place]   . TOTAL KNEE ARTHROPLASTY Right 04/11/2017   Procedure: RIGHT TOTAL KNEE ARTHROPLASTY;  Surgeon: Jessy Oto, MD;  Location: [place];  Service: Orthopedics;  Laterality: Right;  . WRIST SURGERY     lt and rt    There were no vitals filed for this visit.  Subjective Assessment - 07/16/17 1038    Subjective  Uses  static stretch brace with 7/10 pain,  3 X a day for 15 minutes Back pain is getting worse    Currently in Pain?  Yes    Pain Score  4     Pain Location  Knee    Pain Orientation  Right    Pain Descriptors / Indicators  Aching    Pain Radiating Towards  distal quads    Pain Frequency  Intermittent    Aggravating Factors  stretching,  walking too long    Pain Relieving Factors  heat   tape  rest    Pain Score  7    Pain Location  Back    Pain Orientation  Right;Lower    Pain Descriptors / Indicators  Aching neuropathy    Pain Type  Acute pain    Pain Radiating Towards  both legs     Aggravating Factors   standing after sitting    Pain Relieving Factors  shifting position to sit on right side                       [facility] Adult PT Treatment/Exercise - 07/16/17 0001      Knee/Hip Exercises: Aerobic   Nustep  6 minutes L5 UE, LE       Knee/Hip Exercises: Supine   Quad Sets  10 reps    Short Arc Quad Sets  10 reps from blue pool noodle,  difficult    Bridges  10 reps    Patellar Mobs  yes      Moist Heat Therapy   Number Minutes Moist Heat  -- used concurrent with stretching and manual  changing position    Moist Heat Location  Knee      Manual Therapy   Manual therapy comments  patella mobs helpful witk knee pain and comfort,  less stiff post ,manual    Soft tissue mobilization  instrument assist distal quads ,    Passive ROM  knee extension             PT Education - 07/16/17 1114    Education provided  Yes    Education Details  how to stretch into extension.  Use a cane for safety.    Person(s) Educated  Patient    Methods  Explanation;Demonstration    Comprehension  Verbalized understanding       PT Short Term Goals - 07/16/17 1122      PT SHORT TERM GOAL #1   Title  Pt will be I with initial HEP for ROM and strength in R knee     Time  4    Period  Weeks    Status  Achieved      PT SHORT TERM GOAL #2   Title  Pt will be able to go without compression stocking for up to 3 hours and manage edema     Baseline  wears stocking during the day    Time  4    Period  Weeks    Status  Deferred      PT SHORT TERM GOAL #3   Title  Pt will achieve no more than 10 deg knee extension for improved gait, flexibility.     Baseline  -14 AROM    Time  4    Period  Weeks    Status  On-going        PT Long Term Goals - 07/14/17 1141      PT LONG TERM GOAL #1   Title  Pt will understand RICE, posture and body mechanics as it pertains to knee and back pain.     Status  Achieved      PT LONG TERM GOAL #2   Title  Pt will be I with more advanced HEP for knee, core.     Baseline  up to date     Status  Partially Met      PT LONG TERM GOAL #3  Title  Pt will less than 60% limited on FOTO to demo functional improvement    Status  Unable to assess      PT LONG TERM GOAL #4   Title  Pt will be able to go outside and participate in family functions without limitations of knee pain, including walking on uneven ground.     Baseline  improved , gets flashes of pain in Lt thigh when on uneven ground.     Status  On-going      PT LONG TERM GOAL #5   Title  Pt  will be able to walk for 30 min for general health and fitness without lasting knee pain     Status  On-going            Plan - 07/16/17 1116    Clinical Impression Statement  -14 AROM post session.  ROM gradually improving.  Patient has concerns about his back.  He was encouraged to return to using the cane for safety and to see MD if things get worse or do not improve.  pain knee left a 4/10 at end of session    PT Next Visit Plan  cont ROM, manual, caution with low back.     PT Home Exercise Plan  hamstring stretch, quad set, LAQ (has full HEP from previous episode but told to focus on knee ext ) , wall slide for quads //////supine scapular stability with red band shoulder ER red band and flexion with wand, standing  Shoulder doorway stretch,  shoulder isometrics.  step ups  .    Consulted and Agree with Plan of Care  Patient       Patient will benefit from skilled therapeutic intervention in order to improve the following deficits and impairments:     Visit Diagnosis: Localized edema  Difficulty in walking, not elsewhere classified  Acute pain of left knee  Stiffness of left knee, not elsewhere classified     Problem List Patient Active Problem List   Diagnosis Date Noted  . Closed fracture of right ankle   . Cellulitis of right leg 05/03/2017  . Hypokalemia 05/03/2017  . Hypertension 05/03/2017  . GERD (gastroesophageal reflux disease) 05/03/2017  . Anxiety 05/03/2017  . S/P total knee replacement using cement, right 04/11/2017  . Status post total left knee replacement 09/26/2016  . Presence of left artificial knee joint 07/25/2016  . Pain and swelling of left lower leg 07/25/2016  . Bilateral primary osteoarthritis of knee 07/19/2016    Class: Chronic  . Unilateral primary osteoarthritis, right knee 07/19/2016  . 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 Hypotestosteronemia 09/08/2015  . Cervical spondylosis without myelopathy 03/25/2014     Class: Chronic  . Left cervical radiculopathy 03/25/2014    Class: Chronic  . Cervical spondylosis with radiculopathy 03/25/2014    HARRIS,KAREN PTA 07/16/2017, 11:25 AM  [facility] [address] [place], [place], [address] Phone: [phone]   Fax:  [phone]  Name: Jesus Brown MRN: [id number] Date of Birth: [birth year]

## 2017-07-18 ENCOUNTER — Ambulatory Visit (INDEPENDENT_AMBULATORY_CARE_PROVIDER_SITE_OTHER): Payer: BLUE CROSS/BLUE SHIELD | Admitting: Specialist

## 2017-07-18 ENCOUNTER — Encounter (INDEPENDENT_AMBULATORY_CARE_PROVIDER_SITE_OTHER): Payer: Self-pay | Admitting: Specialist

## 2017-07-18 ENCOUNTER — Ambulatory Visit (INDEPENDENT_AMBULATORY_CARE_PROVIDER_SITE_OTHER): Payer: BLUE CROSS/BLUE SHIELD

## 2017-07-18 VITALS — BP 123/69 | HR 96 | Ht 68.0 in | Wt 226.0 lb

## 2017-07-18 DIAGNOSIS — M25562 Pain in left knee: Secondary | ICD-10-CM | POA: Diagnosis not present

## 2017-07-18 NOTE — Progress Notes (Signed)
Office Visit Note   Patient: Jesus Brown           Date of Birth: June 12, 1965           MRN: [id number] Visit Date: 07/18/2017              Requested by: Merrilee Seashore, [place] [place], [place] [address] PCP: Merrilee Seashore, MD   Assessment & Plan: Visit Diagnoses:  1. Painful patella, left     Plan: Will try a course of patello femoral exercises to improve the left knee pain. Terminal quad strengthening  Inotophoresis of the left lateral patellofemoral joint line.   Follow-Up Instructions: Return in about 6 weeks (around 08/29/2017).   Orders:  Orders Placed This Encounter  Procedures  . XR Knee 1-2 Views Left   No orders of the defined types were placed in this encounter.     Procedures: No procedures performed   Clinical Data: No additional findings.   Subjective: Chief Complaint  Patient presents with  . Left Knee - Pain    S/p CT scan     HPI  Review of Systems   Objective: Vital Signs: BP 123/69 (BP Location: Left Arm, Patient Position: Sitting, Cuff Size: Large)   Pulse 96   Ht 5\' 8"  (1.727 m)   Wt 226 lb (102.5 kg)   BMI 34.36 kg/m   Physical Exam  Ortho Exam  Specialty Comments:  No specialty comments available.  Imaging: Xr Knee 1-2 Views Left  Result Date: 07/18/2017 Bilateral patellofemoral tangential radiograph show the patella buttons in good alignment but there is lateral patella femoral prosthesis joint narrowing. And he mayhave some amount of impingement of the native lateral patella vs the lateral anterior trochlea of the femoral prosthesis.    PMFS History: Patient Active Problem List   Diagnosis Date Noted  . Bilateral primary osteoarthritis of knee 07/19/2016    Priority: High    Class: Chronic  . Cervical spondylosis without myelopathy 03/25/2014    Priority: High    Class: Chronic  . Left cervical radiculopathy 03/25/2014    Priority: High    Class: Chronic  . Closed  fracture of right ankle   . Cellulitis of right leg 05/03/2017  . Hypokalemia 05/03/2017  . Hypertension 05/03/2017  . GERD (gastroesophageal reflux disease) 05/03/2017  . Anxiety 05/03/2017  . S/P total knee replacement using cement, right 04/11/2017  . Status post total left knee replacement 09/26/2016  . Presence of left artificial knee joint 07/25/2016  . Pain and swelling of left lower leg 07/25/2016  . Unilateral primary osteoarthritis, right knee 07/19/2016  . 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 Hypotestosteronemia 09/08/2015  . Cervical spondylosis with radiculopathy 03/25/2014   Past Medical History:  Diagnosis Date  . Anxiety    mainly r/t pain  . Arthritis    neck, knees  . Asthma    AS CHILD   . Back pain, chronic   . DDD (degenerative disc disease)   . Dysphagia   . 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 GERD (gastroesophageal reflux disease)   . Hypertension   . Hypotestosteronemia 09/08/2015  . Neck pain, chronic     Family History  Problem Relation Age of Onset  . Hypertension Mother   . Heart attack Father     Past Surgical History:  Procedure Laterality Date  . ANKLE SURGERY    . BACK SURGERY  2010,2011   lumb fusionx2  .  CERVICAL FUSION  1655,3748   3 surgeries  . DIRECT LARYNGOSCOPY N/A 11/09/2012   Procedure: DIRECT LARYNGOSCOPY;  Surgeon: Jodi Marble, MD;  Location: [place];  Service: ENT;  Laterality: N/A;  . ESOPHAGOGASTRODUODENOSCOPY N/A 08/03/2012   Procedure: ESOPHAGOGASTRODUODENOSCOPY (EGD);  Surgeon: Arta Silence, MD;  Location: [facility] ENDOSCOPY;  Service: Endoscopy;  Laterality: N/A;  . ESOPHAGOSCOPY W/ BOTOX INJECTION N/A 11/09/2012   Procedure: ESOPHAGOSCOPY POSSIBLE BIOPSY AND POSSIBLE BOTOX INJECTION CRICOPHARYNGEUS;  Surgeon: Jodi Marble, MD;  Location: [place];  Service: ENT;  Laterality: N/A;  Esophagoscopy with botox injection  . HERNIA REPAIR     ing-rt  . KNEE  SURGERY     rt and left x2 each=4  . POSTERIOR CERVICAL FUSION/FORAMINOTOMY N/A 03/25/2014   Procedure: LEFT C7-T1 FORAMINOTOMY;  Surgeon: Jessy Oto, MD;  Location: [place];  Service: Orthopedics;  Laterality: N/A;  . [place]   rt and lt  . TONSILLECTOMY    . TOTAL KNEE ARTHROPLASTY Left 07/19/2016   Procedure: LEFT TOTAL KNEE ARTHROPLASTY CORTISON INJECTION IN RIGHT KNEE;  Surgeon: Jessy Oto, MD;  Location: [place];  Service: Orthopedics;  Laterality: Left;  MAY NEED RNFA TO FINISH CASE PER [place]   . TOTAL KNEE ARTHROPLASTY Right 04/11/2017   Procedure: RIGHT TOTAL KNEE ARTHROPLASTY;  Surgeon: Jessy Oto, MD;  Location: [place];  Service: Orthopedics;  Laterality: Right;  . WRIST SURGERY     lt and rt   Social History   Occupational History  . Not on file  Tobacco Use  . Smoking status: Never Smoker  . Smokeless tobacco: Never Used  Substance and Sexual Activity  . Alcohol use: No  . Drug use: No  . Sexual activity: Not on file

## 2017-07-18 NOTE — Patient Instructions (Signed)
Will try a course of patello femoral exercises to improve the left knee pain. Terminal quad strengthening  Inotophoresis of the left lateral patellofemoral joint line.

## 2017-07-22 ENCOUNTER — Ambulatory Visit: Payer: BLUE CROSS/BLUE SHIELD | Admitting: Physical Therapy

## 2017-07-22 DIAGNOSIS — M25561 Pain in right knee: Secondary | ICD-10-CM

## 2017-07-22 DIAGNOSIS — R600 Localized edema: Secondary | ICD-10-CM

## 2017-07-22 DIAGNOSIS — M25562 Pain in left knee: Secondary | ICD-10-CM

## 2017-07-22 DIAGNOSIS — M25662 Stiffness of left knee, not elsewhere classified: Secondary | ICD-10-CM

## 2017-07-22 DIAGNOSIS — M6281 Muscle weakness (generalized): Secondary | ICD-10-CM

## 2017-07-22 DIAGNOSIS — R262 Difficulty in walking, not elsewhere classified: Secondary | ICD-10-CM

## 2017-07-22 DIAGNOSIS — M25612 Stiffness of left shoulder, not elsewhere classified: Secondary | ICD-10-CM | POA: Diagnosis not present

## 2017-07-22 NOTE — Therapy (Signed)
[place], [place], [address] Phone: [phone]   Fax:  [phone]  Physical Therapy Treatment  Patient Details  Name: Jesus Brown MRN: [id number] Date of Birth: 1965-07-14 Referring Provider: Dr. Corinna Capra   Encounter Date: 07/22/2017  PT End of Session - 07/22/17 1513    Visit Number  19    Number of Visits  20    Date for PT Re-Evaluation  08/21/17    PT Start Time  5093    PT Stop Time  1545    PT Time Calculation (min)  39 min    Activity Tolerance  Patient tolerated treatment well    Behavior During Therapy  [facility] for tasks assessed/performed       Past Medical History:  Diagnosis Date  . Anxiety    mainly r/t pain  . Arthritis    neck, knees  . Asthma    AS CHILD   . Back pain, chronic   . DDD (degenerative disc disease)   . Dysphagia   . 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 GERD (gastroesophageal reflux disease)   . Hypertension   . Hypotestosteronemia 09/08/2015  . Neck pain, chronic     Past Surgical History:  Procedure Laterality Date  . ANKLE SURGERY    . BACK SURGERY  2010,2011   lumb fusionx2  . CERVICAL FUSION  2671,2458   3 surgeries  . DIRECT LARYNGOSCOPY N/A 11/09/2012   Procedure: DIRECT LARYNGOSCOPY;  Surgeon: Jodi Marble, MD;  Location: [place];  Service: ENT;  Laterality: N/A;  . ESOPHAGOGASTRODUODENOSCOPY N/A 08/03/2012   Procedure: ESOPHAGOGASTRODUODENOSCOPY (EGD);  Surgeon: Arta Silence, MD;  Location: [facility] ENDOSCOPY;  Service: Endoscopy;  Laterality: N/A;  . ESOPHAGOSCOPY W/ BOTOX INJECTION N/A 11/09/2012   Procedure: ESOPHAGOSCOPY POSSIBLE BIOPSY AND POSSIBLE BOTOX INJECTION CRICOPHARYNGEUS;  Surgeon: Jodi Marble, MD;  Location: [place];  Service: ENT;  Laterality: N/A;  Esophagoscopy with botox injection  . HERNIA REPAIR     ing-rt  . KNEE SURGERY     rt and left x2 each=4  . POSTERIOR CERVICAL  FUSION/FORAMINOTOMY N/A 03/25/2014   Procedure: LEFT C7-T1 FORAMINOTOMY;  Surgeon: Jessy Oto, MD;  Location: [place];  Service: Orthopedics;  Laterality: N/A;  . [place]   rt and lt  . TONSILLECTOMY    . TOTAL KNEE ARTHROPLASTY Left 07/19/2016   Procedure: LEFT TOTAL KNEE ARTHROPLASTY CORTISON INJECTION IN RIGHT KNEE;  Surgeon: Jessy Oto, MD;  Location: [place];  Service: Orthopedics;  Laterality: Left;  MAY NEED RNFA TO FINISH CASE PER [place]   . TOTAL KNEE ARTHROPLASTY Right 04/11/2017   Procedure: RIGHT TOTAL KNEE ARTHROPLASTY;  Surgeon: Jessy Oto, MD;  Location: [place];  Service: Orthopedics;  Laterality: Right;  . WRIST SURGERY     lt and rt    There were no vitals filed for this visit.  Subjective Assessment - 07/22/17 1509    Subjective  Saw MD 07/18/17.  Anotther XR done see below.  Rt. knee doing great.  Back is terrible. Having an injection.  MD wants more PT for L knee McConnell     Diagnostic tests  Bilateral patellofemoral tangential radiograph show the patella buttons in good alignment but there is lateral patella femoral prosthesis joint narrowing. And he may have some amount of impingement of the native lateral patella vs the lateral anterior trochlea of the femoral prosthesis..    Currently  in Pain?  Yes    Pain Score  1     Pain Location  Knee    Pain Orientation  Right    Pain Descriptors / Indicators  Aching    Pain Type  Chronic pain    Pain Onset  More than a month ago    Pain Frequency  Intermittent         [facility] PT Assessment - 07/22/17 0001      AROM   Right Knee Extension  15    Right Knee Flexion  106    Left Knee Extension  8    Left Knee Flexion  114                   [facility] Adult PT Treatment/Exercise - 07/22/17 0001      Knee/Hip Exercises: Supine   Terminal Knee Extension  Strengthening;Left;1 set;10 reps    Hip Adduction Isometric  Strengthening;1 set    Bridges with Diona Foley Squeeze  Strengthening;Both;1 set add  L knee extension     Straight Leg Raise with External Rotation  Strengthening;Left;1 set;20 reps 30 reps     Patellar Mobs  bilateral       Manual Therapy   Manual Therapy  Taping    Manual therapy comments  patellar mobs    Joint Mobilization  GR II-II ext mob Rt knee     Soft tissue mobilization  Rt anterior knee, superior patella     McConnell  L knee patella pulled medially              PT Education - 07/22/17 2133    Education provided  Yes    Education Details  McConnell taping, exercises, POC    Person(s) Educated  Patient    Methods  Explanation;Handout    Comprehension  Verbalized understanding       PT Short Term Goals - 07/22/17 2134      PT SHORT TERM GOAL #1   Title  Pt will be I with initial HEP for ROM and strength in R knee     Status  Achieved      PT SHORT TERM GOAL #2   Title  Pt will be able to go without compression stocking for up to 3 hours and manage edema     Status  Achieved      PT SHORT TERM GOAL #3   Title  Pt will achieve no more than 10 deg knee extension for improved gait, flexibility.     Baseline  -14     Status  On-going        PT Long Term Goals - 07/22/17 2135      PT LONG TERM GOAL #1   Title  Pt will understand RICE, posture and body mechanics as it pertains to knee and back pain.     Status  Achieved      PT LONG TERM GOAL #2   Title  Pt will be I with more advanced HEP for knee, core including McConnell for L knee     Status  Partially Met      PT LONG TERM GOAL #3   Title  Pt will less than 60% limited on FOTO to demo functional improvement    Status  Unable to assess      PT LONG TERM GOAL #4   Title  Pt will be able to go outside and participate in family functions without limitations of knee pain, including walking  on uneven ground.     Baseline  Rt knee does not limit, Lt knee locks and he has to manipulate it, back is very limiting     Status  Partially Met      PT LONG TERM GOAL #5   Title  Pt will be  able to walk for 30 min for general health and fitness without lasting knee pain     Status  On-going      PT LONG TERM GOAL #6   Title  Pt will be able to complete ADLs, reaching in all directions without increase in shoulder pain (donning/doffing shirt)     Baseline  pain in shoulder about every 3 - 4 days, more intermittent     Status  Achieved      PT LONG TERM GOAL #7   Title  Pt will demo in LUE to 4+/5 in flexion, abduction for maximal functional use of L UE     Status  Partially Met            Plan - 07/22/17 2137    Clinical Impression Statement  Patient saw Dr. Louanne Skye, CT confirmed he does exhibit some possible impingement of the lateral patellar compartment.  McConnell tape improved discomfort with gait  Will cont to build terminal knee ext in L knee and keep working on extension ROM in Rt knee for the remainder of his PT visits.     Rehab Potential  Good    PT Frequency  2x / week 1-2 times per week     PT Duration  4 weeks    PT Treatment/Interventions  ADLs/Self Care Home Management;Cryotherapy;Moist Heat;Electrical Stimulation;Therapeutic activities;Therapeutic exercise;Balance training;Patient/family education;Manual techniques;Passive range of motion;Neuromuscular re-education;Taping;Vasopneumatic Device;Functional mobility training;Gait training;Stair training;DME Instruction;Iontophoresis 52m/ml Dexamethasone;Ultrasound    PT Next Visit Plan  review HEP, show how to tape, extension ROM, manual, Ionto    PT Home Exercise Plan  hamstring stretch, quad set, LAQ (has full HEP from previous episode but told to focus on knee ext ) , wall slide for quads //////supine scapular stability with red band shoulder ER red band and flexion with wand, standing  Shoulder doorway stretch,  shoulder isometrics.  step ups  .    Consulted and Agree with Plan of Care  Patient       Patient will benefit from skilled therapeutic intervention in order to improve the following deficits and  impairments:  Abnormal gait, Decreased mobility, Difficulty walking, Impaired sensation, Improper body mechanics, Decreased scar mobility, Decreased range of motion, Decreased activity tolerance, Decreased strength, Increased fascial restricitons, Impaired flexibility, Pain, Increased edema, Impaired UE functional use  Visit Diagnosis: Localized edema  Difficulty in walking, not elsewhere classified  Acute pain of left knee  Stiffness of left knee, not elsewhere classified  Acute pain of right knee  Muscle weakness (generalized)     Problem List Patient Active Problem List   Diagnosis Date Noted  . Closed fracture of right ankle   . Cellulitis of right leg 05/03/2017  . Hypokalemia 05/03/2017  . Hypertension 05/03/2017  . GERD (gastroesophageal reflux disease) 05/03/2017  . Anxiety 05/03/2017  . S/P total knee replacement using cement, right 04/11/2017  . Status post total left knee replacement 09/26/2016  . Presence of left artificial knee joint 07/25/2016  . Pain and swelling of left lower leg 07/25/2016  . Bilateral primary osteoarthritis of knee 07/19/2016    Class: Chronic  . Unilateral primary osteoarthritis, right knee 07/19/2016  . Erythrocytosis due to endocrine  disorders 09/08/2015  . Hypotestosteronemia 09/08/2015  . Cervical spondylosis without myelopathy 03/25/2014    Class: Chronic  . Left cervical radiculopathy 03/25/2014    Class: Chronic  . Cervical spondylosis with radiculopathy 03/25/2014    PAA,JENNIFER 07/22/2017, 9:57 PM  [place], [place], [address] Phone: [phone]   Fax:  [phone]  Name: Jesus Brown MRN: [id number] Date of Birth: November 09, 1965  Raeford Razor, PT 07/22/17 9:57 PM Phone: [phone] Fax: [phone]

## 2017-08-04 ENCOUNTER — Encounter: Payer: Self-pay | Admitting: Physical Therapy

## 2017-08-04 ENCOUNTER — Ambulatory Visit: Payer: BLUE CROSS/BLUE SHIELD | Admitting: Physical Therapy

## 2017-08-04 DIAGNOSIS — M25561 Pain in right knee: Secondary | ICD-10-CM

## 2017-08-04 DIAGNOSIS — R262 Difficulty in walking, not elsewhere classified: Secondary | ICD-10-CM

## 2017-08-04 DIAGNOSIS — M25562 Pain in left knee: Secondary | ICD-10-CM

## 2017-08-04 DIAGNOSIS — M25612 Stiffness of left shoulder, not elsewhere classified: Secondary | ICD-10-CM | POA: Diagnosis not present

## 2017-08-04 DIAGNOSIS — R600 Localized edema: Secondary | ICD-10-CM

## 2017-08-04 DIAGNOSIS — M6281 Muscle weakness (generalized): Secondary | ICD-10-CM

## 2017-08-04 DIAGNOSIS — M25662 Stiffness of left knee, not elsewhere classified: Secondary | ICD-10-CM

## 2017-08-04 NOTE — Therapy (Signed)
[place], [place], [address] Phone: [phone]   Fax:  [phone]  Physical Therapy Treatment  Patient Details  Name: Jesus Brown MRN: [id number] Date of Birth: 27-Mar-1966 Referring Provider: Dr. Corinna Capra   Encounter Date: 08/04/2017  PT End of Session - 08/04/17 1124    Visit Number  20    Number of Visits  20    PT Start Time  1024    PT Stop Time  1103    PT Time Calculation (min)  39 min    Activity Tolerance  Patient tolerated treatment well    Behavior During Therapy  [facility] for tasks assessed/performed       Past Medical History:  Diagnosis Date  . Anxiety    mainly r/t pain  . Arthritis    neck, knees  . Asthma    AS CHILD   . Back pain, chronic   . DDD (degenerative disc disease)   . Dysphagia   . 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 GERD (gastroesophageal reflux disease)   . Hypertension   . Hypotestosteronemia 09/08/2015  . Neck pain, chronic     Past Surgical History:  Procedure Laterality Date  . ANKLE SURGERY    . BACK SURGERY  2010,2011   lumb fusionx2  . CERVICAL FUSION  8264,1583   3 surgeries  . DIRECT LARYNGOSCOPY N/A 11/09/2012   Procedure: DIRECT LARYNGOSCOPY;  Surgeon: Jodi Marble, MD;  Location: [place];  Service: ENT;  Laterality: N/A;  . ESOPHAGOGASTRODUODENOSCOPY N/A 08/03/2012   Procedure: ESOPHAGOGASTRODUODENOSCOPY (EGD);  Surgeon: Arta Silence, MD;  Location: [facility] ENDOSCOPY;  Service: Endoscopy;  Laterality: N/A;  . ESOPHAGOSCOPY W/ BOTOX INJECTION N/A 11/09/2012   Procedure: ESOPHAGOSCOPY POSSIBLE BIOPSY AND POSSIBLE BOTOX INJECTION CRICOPHARYNGEUS;  Surgeon: Jodi Marble, MD;  Location: [place];  Service: ENT;  Laterality: N/A;  Esophagoscopy with botox injection  . HERNIA REPAIR     ing-rt  . KNEE SURGERY     rt and left x2 each=4  . POSTERIOR CERVICAL FUSION/FORAMINOTOMY N/A 03/25/2014   Procedure:  LEFT C7-T1 FORAMINOTOMY;  Surgeon: Jessy Oto, MD;  Location: [place];  Service: Orthopedics;  Laterality: N/A;  . [place]   rt and lt  . TONSILLECTOMY    . TOTAL KNEE ARTHROPLASTY Left 07/19/2016   Procedure: LEFT TOTAL KNEE ARTHROPLASTY CORTISON INJECTION IN RIGHT KNEE;  Surgeon: Jessy Oto, MD;  Location: [place];  Service: Orthopedics;  Laterality: Left;  MAY NEED RNFA TO FINISH CASE PER [place]   . TOTAL KNEE ARTHROPLASTY Right 04/11/2017   Procedure: RIGHT TOTAL KNEE ARTHROPLASTY;  Surgeon: Jessy Oto, MD;  Location: [place];  Service: Orthopedics;  Laterality: Right;  . WRIST SURGERY     lt and rt    There were no vitals filed for this visit.  Subjective Assessment - 08/04/17 1033    Subjective  Both knees doing well.  THe L knee only hurts every 3 days or so.  Has changed the way he sits.  Was crossed.  It will catch when im up walking more. but the exercises have helped.  Last day today.     Currently in Pain?  No/denies         [facility] PT Assessment - 08/04/17 0001      Observation/Other Assessments   Focus on Therapeutic Outcomes (FOTO)   65%      AROM   Right  Knee Extension  13    Right Knee Flexion  108    Left Knee Extension  10    Left Knee Flexion  122           [facility] Adult PT Treatment/Exercise - 08/04/17 0001      Knee/Hip Exercises: Seated   Long Arc Quad  Strengthening;Both;2 sets;15 reps;Weights    Long Arc Quad Weight  8 lbs.      Knee/Hip Exercises: Supine   Terminal Knee Extension  Strengthening;Left;1 set;10 reps    Hip Adduction Isometric  Strengthening;1 set    Bridges with Jesus Brown Right  Strengthening;Both;1 set add L knee extension     Straight Leg Raises  Strengthening;Both;1 set;20 reps 4 lbs on L     Straight Leg Raise with External Rotation  Strengthening;Left;1 set;20 reps 20 reps x 4 lbs     Patellar Mobs  bilateral              PT Education - 08/04/17 1318    Education provided  Yes    Education Details  How  to tape his L knee (McConnell), final ROM , recumbant bike     Person(s) Educated  Patient    Methods  Explanation    Comprehension  Verbalized understanding;Returned demonstration       PT Short Term Goals - 08/04/17 1051      PT SHORT TERM GOAL #1   Title  Pt will be I with initial HEP for ROM and strength in R knee     Status  Achieved      PT SHORT TERM GOAL #2   Title  Pt will be able to go without compression stocking for up to 3 hours and manage edema     Baseline  wears all night and about 8 hours during the day    Status  Achieved      PT SHORT TERM GOAL #3   Title  Pt will achieve no more than 10 deg knee extension for improved gait, flexibility.     Baseline  lack 10 deg and 13 deg in each leg     Status  Partially Met        PT Long Term Goals - 08/04/17 1053      PT LONG TERM GOAL #1   Title  Pt will understand RICE, posture and body mechanics as it pertains to knee and back pain.     Status  Achieved      PT LONG TERM GOAL #2   Title  Pt will be I with more advanced HEP for knee, core including McConnell for L knee     Status  Achieved      PT LONG TERM GOAL #3   Title  Pt will less than 60% limited on FOTO to demo functional improvement      PT LONG TERM GOAL #4   Title  Pt will be able to go outside and participate in family functions without limitations of knee pain, including walking on uneven ground.     Baseline  Rt knee does not limit, Lt knee locks occasionally and he has to manipulate it, back is very limiting     Status  Partially Met      PT LONG TERM GOAL #5   Title  Pt will be able to walk for 30 min for general health and fitness without lasting knee pain     Baseline  15 min     Status  Partially Met      PT LONG TERM GOAL #6   Title  Pt will be able to complete ADLs, reaching in all directions without increase in shoulder pain (donning/doffing shirt)     Status  Achieved      PT LONG TERM GOAL #7   Title  Pt will demo in LUE to 4+/5  in flexion, abduction for maximal functional use of L UE     Baseline  was 4/5 DC shoulder     Status  Unable to assess            Plan - 08/04/17 1124    Clinical Impression Statement  Patient is discharged from PT.  His FOTo score has remained about the same as a month ago. Overall he is 20% improved since eval.  Newer onset of L knee pain has improved.  AROM in both knees has plateaued.  He has the tools he needs to continue on his own.  A recumbant bike and use of weight machines for LE strength would be beneficial.     PT Next Visit Plan  DC     PT Home Exercise Plan  hamstring stretch, quad set, LAQ (has full HEP from previous episode but told to focus on knee ext ) , wall slide for quads //////supine scapular stability with red band shoulder ER red band and flexion with wand, standing  Shoulder doorway stretch,  shoulder isometrics.  step ups  .    Consulted and Agree with Plan of Care  Patient       Patient will benefit from skilled therapeutic intervention in order to improve the following deficits and impairments:  Abnormal gait, Decreased mobility, Difficulty walking, Impaired sensation, Improper body mechanics, Decreased scar mobility, Decreased range of motion, Decreased activity tolerance, Decreased strength, Increased fascial restricitons, Impaired flexibility, Pain, Increased edema, Impaired UE functional use  Visit Diagnosis: Localized edema  Difficulty in walking, not elsewhere classified  Acute pain of left knee  Stiffness of left knee, not elsewhere classified  Acute pain of right knee  Muscle weakness (generalized)     Problem List Patient Active Problem List   Diagnosis Date Noted  . Closed fracture of right ankle   . Cellulitis of right leg 05/03/2017  . Hypokalemia 05/03/2017  . Hypertension 05/03/2017  . GERD (gastroesophageal reflux disease) 05/03/2017  . Anxiety 05/03/2017  . S/P total knee replacement using cement, right 04/11/2017  . Status  post total left knee replacement 09/26/2016  . Presence of left artificial knee joint 07/25/2016  . Pain and swelling of left lower leg 07/25/2016  . Bilateral primary osteoarthritis of knee 07/19/2016    Class: Chronic  . Unilateral primary osteoarthritis, right knee 07/19/2016  . 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 Hypotestosteronemia 09/08/2015  . Cervical spondylosis without myelopathy 03/25/2014    Class: Chronic  . Left cervical radiculopathy 03/25/2014    Class: Chronic  . Cervical spondylosis with radiculopathy 03/25/2014    Jesus Brown 08/04/2017, 1:22 PM  [facility] [address] [place], [place], [address] Phone: [phone]   Fax:  [phone]  Name: Jesus Brown MRN: [id number] Date of Birth: 1965/11/23  PHYSICAL THERAPY DISCHARGE SUMMARY  Visits from Start of Care: 20  Current functional level related to goals / functional outcomes: Pt is back to functioning to a manageable level.  He cont to have difficulty walking >2 blocks without pain.  Back pain and Rt. Leg burn and limit walking  as well.  ROM limited in extension and flexion but have plateaued.     Remaining deficits: AROM knees , back pain, core and hip strength   Education / Equipment: McConnell ex and tape, HEP, ice/heat, posture, lifting, core, JAS Plan: Patient agrees to discharge.  Patient goals were partially met. Patient is being discharged due to being pleased with the current functional level.  ?????    Plateau of progress.  Jesus Brown, PT 08/04/17 1:27 PM Phone: [phone] Fax: [phone]

## 2017-09-03 ENCOUNTER — Ambulatory Visit (INDEPENDENT_AMBULATORY_CARE_PROVIDER_SITE_OTHER): Payer: Medicare Other | Admitting: Specialist

## 2017-10-10 ENCOUNTER — Ambulatory Visit (INDEPENDENT_AMBULATORY_CARE_PROVIDER_SITE_OTHER): Payer: BLUE CROSS/BLUE SHIELD | Admitting: Specialist

## 2017-10-10 ENCOUNTER — Encounter (INDEPENDENT_AMBULATORY_CARE_PROVIDER_SITE_OTHER): Payer: Self-pay | Admitting: Specialist

## 2017-10-10 VITALS — BP 136/72 | HR 97 | Ht 68.0 in | Wt 230.0 lb

## 2017-10-10 DIAGNOSIS — M4326 Fusion of spine, lumbar region: Secondary | ICD-10-CM

## 2017-10-10 DIAGNOSIS — T8484XA Pain due to internal orthopedic prosthetic devices, implants and grafts, initial encounter: Secondary | ICD-10-CM | POA: Diagnosis not present

## 2017-10-10 DIAGNOSIS — M4322 Fusion of spine, cervical region: Secondary | ICD-10-CM | POA: Diagnosis not present

## 2017-10-10 DIAGNOSIS — Z96653 Presence of artificial knee joint, bilateral: Secondary | ICD-10-CM

## 2017-10-10 NOTE — Patient Instructions (Signed)
Avoid overhead lifting and overhead use of the arms. Do not lift greater than 5 lbs. Adjust head rest in vehicle to prevent hyperextension if rear ended. Take extra precautions to avoid falling. Avoid frequent bending and stooping  No lifting greater than 10 lbs. May use ice or moist heat for pain. Weight loss is of benefit. Handicap license is approved. Recommend removal of the right ankle lateral plate due to recurring infections, the plate is likely harboring bacteria and should removed. Post surgery 50% weight bearing on the right leg with a cane or crutch. The full weight bearing as tolerated.

## 2017-10-10 NOTE — Progress Notes (Signed)
Office Visit Note   Patient: Jesus Brown           Date of Birth: Aug 17, 1965           MRN: [id number] Visit Date: 10/10/2017              Requested by: Merrilee Seashore, [address] [place], [place] [address] PCP: Merrilee Seashore, MD   Assessment & Plan: Visit Diagnoses:  1. Cervical vertebral fusion   2. Fusion of lumbar spine   3. Hx of total knee replacement, bilateral   4. Painful orthopaedic hardware [facility])     Plan: Avoid overhead lifting and overhead use of the arms. Do not lift greater than 5 lbs. Adjust head rest in vehicle to prevent hyperextension if rear ended. Take extra precautions to avoid falling. Avoid frequent bending and stooping  No lifting greater than 10 lbs. May use ice or moist heat for pain. Weight loss is of benefit. Handicap license is approved. Recommend removal of the right ankle lateral plate due to recurring infections, the plate is likely harboring bacteria and should removed. Post surgery 50% weight bearing on the right leg with a cane or crutch. The full weight bearing as tolerated.   Follow-Up Instructions: Return in about 6 weeks (around 11/21/2017).   Orders:  No orders of the defined types were placed in this encounter.  No orders of the defined types were placed in this encounter.     Procedures: No procedures performed   Clinical Data: No additional findings.   Subjective: Chief Complaint  Patient presents with  . Right Knee - Follow-up  . Left Knee - Follow-up    52 year old male with bilateral total knee replacement, multilevel cervical fusion and lumbar fusion. Plate in his right ankle plate is uncomfortable laterally. He has  Required antibiotics 3 times this year. There is no drainage but cellulitis he takes for recurring cellutitis. He has problems with recurring infection due to mosquitoe bites.   Review of Systems  Constitutional: Negative.   HENT: Negative.   Eyes: Negative.     Respiratory: Negative.   Cardiovascular: Negative.   Gastrointestinal: Negative.   Endocrine: Negative.   Genitourinary: Negative.   Musculoskeletal: Negative.   Skin: Negative.   Allergic/Immunologic: Negative.   Neurological: Negative.   Hematological: Negative.   Psychiatric/Behavioral: Negative.      Objective: Vital Signs: BP 136/72 (BP Location: Left Arm, Patient Position: Sitting)   Pulse 97   Ht 5\' 8"  (1.727 m)   Wt 230 lb (104.3 kg)   BMI 34.97 kg/m   Physical Exam  Constitutional: He is oriented to person, place, and time. He appears well-developed and well-nourished.  HENT:  Head: Normocephalic and atraumatic.  Eyes: Pupils are equal, round, and reactive to light. EOM are normal.  Neck: Normal range of motion. Neck supple.  Pulmonary/Chest: Effort normal and breath sounds normal.  Abdominal: Soft. Bowel sounds are normal.  Neurological: He is alert and oriented to person, place, and time.  Skin: Skin is warm and dry.  Psychiatric: He has a normal mood and affect. His behavior is normal. Judgment and thought content normal.    Back Exam   Tenderness  The patient is experiencing tenderness in the cervical and lumbar.  Range of Motion  Extension: abnormal  Flexion: abnormal  Lateral bend right: abnormal  Lateral bend left: abnormal  Rotation right: abnormal  Rotation left: abnormal   Muscle Strength  Right Quadriceps:  5/5  Left Quadriceps:  5/5  Right Hamstrings:  5/5  Left Hamstrings:  5/5   Tests  Straight leg raise right: negative Straight leg raise left: negative  Reflexes  Patellar: normal Achilles: normal Biceps: normal Babinski's sign: normal   Other  Toe walk: normal Heel walk: normal Sensation: normal Gait: normal  Erythema: no back redness Scars: present   Left Shoulder Exam   Tenderness  The patient is experiencing tenderness in the acromion.  Range of Motion  Active abduction:  80 abnormal  Passive abduction:  90  abnormal  Extension: 60  External rotation: 80  Forward flexion: 80   Muscle Strength  Abduction: 5/5  Internal rotation: 5/5  External rotation: 5/5  Supraspinatus: 5/5  Subscapularis: 5/5  Biceps: 5/5   Tests  Apprehension: positive Impingement: positive  Other  Erythema: absent Scars: absent Sensation: normal Pulse: present       Specialty Comments:  No specialty comments available.  Imaging: No results found.   PMFS History: Patient Active Problem List   Diagnosis Date Noted  . Bilateral primary osteoarthritis of knee 07/19/2016    Priority: High    Class: Chronic  . Cervical spondylosis without myelopathy 03/25/2014    Priority: High    Class: Chronic  . Left cervical radiculopathy 03/25/2014    Priority: High    Class: Chronic  . Closed fracture of right ankle   . Cellulitis of right leg 05/03/2017  . Hypokalemia 05/03/2017  . Hypertension 05/03/2017  . GERD (gastroesophageal reflux disease) 05/03/2017  . Anxiety 05/03/2017  . S/P total knee replacement using cement, right 04/11/2017  . Status post total left knee replacement 09/26/2016  . Presence of left artificial knee joint 07/25/2016  . Pain and swelling of left lower leg 07/25/2016  . Unilateral primary osteoarthritis, right knee 07/19/2016  . 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 Hypotestosteronemia 09/08/2015  . Cervical spondylosis with radiculopathy 03/25/2014   Past Medical History:  Diagnosis Date  . Anxiety    mainly r/t pain  . Arthritis    neck, knees  . Asthma    AS CHILD   . Back pain, chronic   . DDD (degenerative disc disease)   . Dysphagia   . 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 GERD (gastroesophageal reflux disease)   . Hypertension   . Hypotestosteronemia 09/08/2015  . Neck pain, chronic     Family History  Problem Relation Age of Onset  . Hypertension Mother   . Heart attack Father     Past Surgical History:  Procedure  Laterality Date  . ANKLE SURGERY    . BACK SURGERY  2010,2011   lumb fusionx2  . CERVICAL FUSION  9767,3419   3 surgeries  . DIRECT LARYNGOSCOPY N/A 11/09/2012   Procedure: DIRECT LARYNGOSCOPY;  Surgeon: Jodi Marble, MD;  Location: [place];  Service: ENT;  Laterality: N/A;  . ESOPHAGOGASTRODUODENOSCOPY N/A 08/03/2012   Procedure: ESOPHAGOGASTRODUODENOSCOPY (EGD);  Surgeon: Arta Silence, MD;  Location[facility] ENDOSCOPY;  Service: Endoscopy;  Laterality: N/A;  . ESOPHAGOSCOPY W/ BOTOX INJECTION N/A 11/09/2012   Procedure: ESOPHAGOSCOPY POSSIBLE BIOPSY AND POSSIBLE BOTOX INJECTION CRICOPHARYNGEUS;  Surgeon: Jodi Marble, MD;  Location: [place];  Service: ENT;  Laterality: N/A;  Esophagoscopy with botox injection  . HERNIA REPAIR     ing-rt  . KNEE SURGERY     rt and left x2 each=4  . POSTERIOR CERVICAL FUSION/FORAMINOTOMY N/A 03/25/2014   Procedure: LEFT C7-T1 FORAMINOTOMY;  Surgeon:  Jessy Oto, MD;  Location: [place];  Service: Orthopedics;  Laterality: N/A;  . [place]   rt and lt  . TONSILLECTOMY    . TOTAL KNEE ARTHROPLASTY Left 07/19/2016   Procedure: LEFT TOTAL KNEE ARTHROPLASTY CORTISON INJECTION IN RIGHT KNEE;  Surgeon: Jessy Oto, MD;  Location: [place];  Service: Orthopedics;  Laterality: Left;  MAY NEED RNFA TO FINISH CASE PER [place]   . TOTAL KNEE ARTHROPLASTY Right 04/11/2017   Procedure: RIGHT TOTAL KNEE ARTHROPLASTY;  Surgeon: Jessy Oto, MD;  Location: [place];  Service: Orthopedics;  Laterality: Right;  . WRIST SURGERY     lt and rt   Social History   Occupational History  . Not on file  Tobacco Use  . Smoking status: Never Smoker  . Smokeless tobacco: Never Used  Substance and Sexual Activity  . Alcohol use: No  . Drug use: No  . Sexual activity: Not on file

## 2017-11-12 DIAGNOSIS — J3089 Other allergic rhinitis: Secondary | ICD-10-CM | POA: Diagnosis not present

## 2017-11-12 DIAGNOSIS — J3081 Allergic rhinitis due to animal (cat) (dog) hair and dander: Secondary | ICD-10-CM | POA: Diagnosis not present

## 2017-11-12 DIAGNOSIS — J301 Allergic rhinitis due to pollen: Secondary | ICD-10-CM | POA: Diagnosis not present

## 2017-11-21 ENCOUNTER — Ambulatory Visit (INDEPENDENT_AMBULATORY_CARE_PROVIDER_SITE_OTHER): Payer: Medicare Other | Admitting: Orthopedic Surgery

## 2017-11-24 DIAGNOSIS — G894 Chronic pain syndrome: Secondary | ICD-10-CM | POA: Diagnosis not present

## 2017-11-24 DIAGNOSIS — M47812 Spondylosis without myelopathy or radiculopathy, cervical region: Secondary | ICD-10-CM | POA: Diagnosis not present

## 2017-11-24 DIAGNOSIS — F419 Anxiety disorder, unspecified: Secondary | ICD-10-CM | POA: Diagnosis not present

## 2017-11-24 DIAGNOSIS — M62830 Muscle spasm of back: Secondary | ICD-10-CM | POA: Diagnosis not present

## 2017-11-26 DIAGNOSIS — J3081 Allergic rhinitis due to animal (cat) (dog) hair and dander: Secondary | ICD-10-CM | POA: Diagnosis not present

## 2017-11-26 DIAGNOSIS — J301 Allergic rhinitis due to pollen: Secondary | ICD-10-CM | POA: Diagnosis not present

## 2017-11-26 DIAGNOSIS — J3089 Other allergic rhinitis: Secondary | ICD-10-CM | POA: Diagnosis not present

## 2017-12-03 DIAGNOSIS — J301 Allergic rhinitis due to pollen: Secondary | ICD-10-CM | POA: Diagnosis not present

## 2017-12-03 DIAGNOSIS — J3081 Allergic rhinitis due to animal (cat) (dog) hair and dander: Secondary | ICD-10-CM | POA: Diagnosis not present

## 2017-12-03 DIAGNOSIS — J3089 Other allergic rhinitis: Secondary | ICD-10-CM | POA: Diagnosis not present

## 2017-12-05 DIAGNOSIS — J3081 Allergic rhinitis due to animal (cat) (dog) hair and dander: Secondary | ICD-10-CM | POA: Diagnosis not present

## 2017-12-05 DIAGNOSIS — J301 Allergic rhinitis due to pollen: Secondary | ICD-10-CM | POA: Diagnosis not present

## 2017-12-05 DIAGNOSIS — J3089 Other allergic rhinitis: Secondary | ICD-10-CM | POA: Diagnosis not present

## 2017-12-12 ENCOUNTER — Other Ambulatory Visit: Payer: Self-pay

## 2017-12-12 ENCOUNTER — Encounter (HOSPITAL_BASED_OUTPATIENT_CLINIC_OR_DEPARTMENT_OTHER): Payer: Self-pay

## 2017-12-12 DIAGNOSIS — J3089 Other allergic rhinitis: Secondary | ICD-10-CM | POA: Diagnosis not present

## 2017-12-12 DIAGNOSIS — J301 Allergic rhinitis due to pollen: Secondary | ICD-10-CM | POA: Diagnosis not present

## 2017-12-12 DIAGNOSIS — J3081 Allergic rhinitis due to animal (cat) (dog) hair and dander: Secondary | ICD-10-CM | POA: Diagnosis not present

## 2017-12-16 ENCOUNTER — Encounter (HOSPITAL_BASED_OUTPATIENT_CLINIC_OR_DEPARTMENT_OTHER)
Admission: RE | Admit: 2017-12-16 | Discharge: 2017-12-16 | Disposition: A | Discharge status: Home / Self Care | Payer: Medicare Other | Source: Ambulatory Visit | Attending: Specialist | Admitting: Specialist

## 2017-12-16 DIAGNOSIS — Z01812 Encounter for preprocedural laboratory examination: Secondary | ICD-10-CM | POA: Diagnosis present

## 2017-12-16 LAB — BASIC METABOLIC PANEL
Anion gap: 10 (ref 5–15)
BUN: 16 mg/dL (ref 6–20)
CO2: 27 mmol/L (ref 22–32)
Calcium: 9.3 mg/dL (ref 8.9–10.3)
Chloride: 102 mmol/L (ref 98–111)
Creatinine, Ser: 0.9 mg/dL (ref 0.61–1.24)
GFR calc Af Amer: >60 mL/min (ref >60)
GFR calc non Af Amer: >60 mL/min (ref >60)
GLUCOSE: 104 mg/dL — AB (ref 70–99)
POTASSIUM: 4 mmol/L (ref 3.5–5.1)
Sodium: 139 mmol/L (ref 135–145)

## 2017-12-18 NOTE — Anesthesia Preprocedure Evaluation (Addendum)
Anesthesia Evaluation  Patient identified by MRN, date of birth, ID band Patient awake    Reviewed: Allergy & Precautions, NPO status , Patient's Chart, lab work & pertinent test results  Airway Mallampati: III  TM Distance: >3 FB Neck ROM: Full    Dental no notable dental hx. (+) Teeth Intact, Dental Advisory Given   Pulmonary asthma ,    Pulmonary exam normal breath sounds clear to auscultation       Cardiovascular hypertension, Pt. on medications Normal cardiovascular exam Rhythm:Regular Rate:Normal     Neuro/Psych Anxiety    GI/Hepatic GERD  ,  Endo/Other  Morbid obesity  Renal/GU      Musculoskeletal  (+) Arthritis ,   Abdominal (+) + obese,   Peds  Hematology   Anesthesia Other Findings   Reproductive/Obstetrics                            Anesthesia Physical Anesthesia Plan  ASA: III  Anesthesia Plan: General   Post-op Pain Management:  Regional for Post-op pain   Induction: Intravenous  PONV Risk Score and Plan: 2 and Treatment may vary due to age or medical condition  Airway Management Planned: LMA  Additional Equipment:   Intra-op Plan:   Post-operative Plan:   Informed Consent: I have reviewed the patients History and Physical, chart, labs and discussed the procedure including the risks, benefits and alternatives for the proposed anesthesia with the patient or authorized representative who has indicated his/her understanding and acceptance.   Dental advisory given  Plan Discussed with:   Anesthesia Plan Comments: (Plus popliteal block for post op pain)        Anesthesia Quick Evaluation

## 2017-12-19 ENCOUNTER — Ambulatory Visit (HOSPITAL_BASED_OUTPATIENT_CLINIC_OR_DEPARTMENT_OTHER): Payer: PPO | Admitting: Anesthesiology

## 2017-12-19 ENCOUNTER — Encounter (HOSPITAL_BASED_OUTPATIENT_CLINIC_OR_DEPARTMENT_OTHER)
Admission: RE | Disposition: A | Discharge status: Home / Self Care | Payer: Self-pay | Source: Ambulatory Visit | Attending: Specialist

## 2017-12-19 ENCOUNTER — Other Ambulatory Visit: Payer: Self-pay

## 2017-12-19 ENCOUNTER — Ambulatory Visit (HOSPITAL_BASED_OUTPATIENT_CLINIC_OR_DEPARTMENT_OTHER)
Admission: RE | Admit: 2017-12-19 | Discharge: 2017-12-19 | Disposition: A | Discharge status: Home / Self Care | Payer: PPO | Source: Ambulatory Visit | Attending: Specialist | Admitting: Specialist

## 2017-12-19 ENCOUNTER — Encounter (HOSPITAL_BASED_OUTPATIENT_CLINIC_OR_DEPARTMENT_OTHER): Payer: Self-pay | Admitting: *Deleted

## 2017-12-19 DIAGNOSIS — M1990 Unspecified osteoarthritis, unspecified site: Secondary | ICD-10-CM | POA: Insufficient documentation

## 2017-12-19 DIAGNOSIS — K219 Gastro-esophageal reflux disease without esophagitis: Secondary | ICD-10-CM | POA: Insufficient documentation

## 2017-12-19 DIAGNOSIS — I10 Essential (primary) hypertension: Secondary | ICD-10-CM | POA: Insufficient documentation

## 2017-12-19 DIAGNOSIS — G8929 Other chronic pain: Secondary | ICD-10-CM | POA: Insufficient documentation

## 2017-12-19 DIAGNOSIS — Z969 Presence of functional implant, unspecified: Secondary | ICD-10-CM

## 2017-12-19 DIAGNOSIS — F419 Anxiety disorder, unspecified: Secondary | ICD-10-CM | POA: Diagnosis not present

## 2017-12-19 DIAGNOSIS — Z7982 Long term (current) use of aspirin: Secondary | ICD-10-CM | POA: Insufficient documentation

## 2017-12-19 DIAGNOSIS — Z79899 Other long term (current) drug therapy: Secondary | ICD-10-CM | POA: Diagnosis not present

## 2017-12-19 DIAGNOSIS — Z96653 Presence of artificial knee joint, bilateral: Secondary | ICD-10-CM | POA: Diagnosis not present

## 2017-12-19 DIAGNOSIS — T8484XA Pain due to internal orthopedic prosthetic devices, implants and grafts, initial encounter: Secondary | ICD-10-CM | POA: Diagnosis not present

## 2017-12-19 DIAGNOSIS — G8918 Other acute postprocedural pain: Secondary | ICD-10-CM | POA: Diagnosis not present

## 2017-12-19 HISTORY — PX: HARDWARE REMOVAL: SHX979

## 2017-12-19 SURGERY — REMOVAL, HARDWARE
Anesthesia: General | Site: Ankle | Laterality: Right

## 2017-12-19 MED ORDER — CEFAZOLIN SODIUM-DEXTROSE 2-4 GM/100ML-% IV SOLN
2.0000 g | INTRAVENOUS | Status: AC
  Administered 2017-12-19: 2 g via INTRAVENOUS

## 2017-12-19 MED ORDER — HYDROCODONE-ACETAMINOPHEN 7.5-325 MG PO TABS: 1.0000 | ORAL_TABLET | Freq: Once | ORAL | Status: DC | PRN

## 2017-12-19 MED ORDER — LIDOCAINE 2% (20 MG/ML) 5 ML SYRINGE
INTRAMUSCULAR | Status: DC | PRN
  Administered 2017-12-19: 60 mg via INTRAVENOUS

## 2017-12-19 MED ORDER — ACETAMINOPHEN 10 MG/ML IV SOLN: 1000.0000 mg | Freq: Once | INTRAVENOUS | Status: DC | PRN

## 2017-12-19 MED ORDER — PROPOFOL 10 MG/ML IV BOLUS
INTRAVENOUS | Status: DC | PRN
  Administered 2017-12-19: 200 mg via INTRAVENOUS

## 2017-12-19 MED ORDER — FENTANYL CITRATE (PF) 100 MCG/2ML IJ SOLN
50.0000 ug | INTRAMUSCULAR | Status: DC | PRN
  Administered 2017-12-19: 100 ug via INTRAVENOUS

## 2017-12-19 MED ORDER — DEXAMETHASONE SODIUM PHOSPHATE 4 MG/ML IJ SOLN
INTRAMUSCULAR | Status: DC | PRN
  Administered 2017-12-19: 10 mg via INTRAVENOUS

## 2017-12-19 MED ORDER — HYDROMORPHONE HCL 1 MG/ML IJ SOLN: 0.2500 mg | INTRAMUSCULAR | Status: DC | PRN

## 2017-12-19 MED ORDER — LACTATED RINGERS IV SOLN
INTRAVENOUS | Status: DC
  Administered 2017-12-19: 12:00:00 via INTRAVENOUS

## 2017-12-19 MED ORDER — ROPIVACAINE HCL 5 MG/ML IJ SOLN
INTRAMUSCULAR | Status: DC | PRN
  Administered 2017-12-19: 30 mL via PERINEURAL

## 2017-12-19 MED ORDER — BUPIVACAINE LIPOSOME 1.3 % IJ SUSP
INTRAMUSCULAR | Status: DC | PRN
  Administered 2017-12-19: 7 mL

## 2017-12-19 MED ORDER — PROPOFOL 10 MG/ML IV BOLUS
INTRAVENOUS | Status: AC
  Filled 2017-12-19: qty 20

## 2017-12-19 MED ORDER — BUPIVACAINE LIPOSOME 1.3 % IJ SUSP
INTRAMUSCULAR | Status: AC
  Filled 2017-12-19: qty 20

## 2017-12-19 MED ORDER — MEPERIDINE HCL 25 MG/ML IJ SOLN: 6.2500 mg | INTRAMUSCULAR | Status: DC | PRN

## 2017-12-19 MED ORDER — LIDOCAINE 2% (20 MG/ML) 5 ML SYRINGE
INTRAMUSCULAR | Status: AC
  Filled 2017-12-19: qty 5

## 2017-12-19 MED ORDER — FENTANYL CITRATE (PF) 100 MCG/2ML IJ SOLN
INTRAMUSCULAR | Status: AC
  Filled 2017-12-19: qty 2

## 2017-12-19 MED ORDER — DEXAMETHASONE SODIUM PHOSPHATE 10 MG/ML IJ SOLN
INTRAMUSCULAR | Status: AC
  Filled 2017-12-19: qty 1

## 2017-12-19 MED ORDER — MORPHINE SULFATE 15 MG PO TABS: 15.0000 mg | ORAL_TABLET | ORAL | 0 refills | Status: AC | PRN

## 2017-12-19 MED ORDER — 0.9 % SODIUM CHLORIDE (POUR BTL) OPTIME
TOPICAL | Status: DC | PRN
  Administered 2017-12-19: 200 mL

## 2017-12-19 MED ORDER — MIDAZOLAM HCL 2 MG/2ML IJ SOLN
INTRAMUSCULAR | Status: AC
  Filled 2017-12-19: qty 2

## 2017-12-19 MED ORDER — DOXYCYCLINE HYCLATE 100 MG PO TABS: 100.0000 mg | ORAL_TABLET | Freq: Two times a day (BID) | ORAL | 0 refills | Status: DC

## 2017-12-19 MED ORDER — CHLORHEXIDINE GLUCONATE 4 % EX LIQD: 60.0000 mL | Freq: Once | CUTANEOUS | Status: DC

## 2017-12-19 MED ORDER — PROMETHAZINE HCL 25 MG/ML IJ SOLN: 6.2500 mg | INTRAMUSCULAR | Status: DC | PRN

## 2017-12-19 MED ORDER — SCOPOLAMINE 1 MG/3DAYS TD PT72: 1.0000 | MEDICATED_PATCH | Freq: Once | TRANSDERMAL | Status: DC | PRN

## 2017-12-19 MED ORDER — MIDAZOLAM HCL 2 MG/2ML IJ SOLN
1.0000 mg | INTRAMUSCULAR | Status: DC | PRN
  Administered 2017-12-19: 2 mg via INTRAVENOUS

## 2017-12-19 MED ORDER — CEFAZOLIN SODIUM-DEXTROSE 2-4 GM/100ML-% IV SOLN
INTRAVENOUS | Status: AC
  Filled 2017-12-19: qty 100

## 2017-12-19 MED ORDER — ONDANSETRON HCL 4 MG/2ML IJ SOLN
INTRAMUSCULAR | Status: AC
  Filled 2017-12-19: qty 2

## 2017-12-19 SURGICAL SUPPLY — 82 items
ADH SKN CLS APL DERMABOND .7 (GAUZE/BANDAGES/DRESSINGS)
BANDAGE ACE 3X5.8 VEL STRL LF (GAUZE/BANDAGES/DRESSINGS) ×2 IMPLANT
BANDAGE ACE 4X5 VEL STRL LF (GAUZE/BANDAGES/DRESSINGS) ×1 IMPLANT
BANDAGE ACE 6X5 VEL STRL LF (GAUZE/BANDAGES/DRESSINGS) IMPLANT
BANDAGE ESMARK 6X9 LF (GAUZE/BANDAGES/DRESSINGS) ×1 IMPLANT
BLADE SURG 15 STRL LF DISP TIS (BLADE) ×1 IMPLANT
BLADE SURG 15 STRL SS (BLADE) ×2
BNDG CMPR 9X4 STRL LF SNTH (GAUZE/BANDAGES/DRESSINGS)
BNDG CMPR 9X6 STRL LF SNTH (GAUZE/BANDAGES/DRESSINGS) ×1
BNDG COHESIVE 4X5 TAN STRL (GAUZE/BANDAGES/DRESSINGS) ×1 IMPLANT
BNDG ESMARK 4X9 LF (GAUZE/BANDAGES/DRESSINGS) IMPLANT
BNDG ESMARK 6X9 LF (GAUZE/BANDAGES/DRESSINGS) ×2
BNDG GAUZE ELAST 4 BULKY (GAUZE/BANDAGES/DRESSINGS) IMPLANT
BOOT STEPPER DURA MED (SOFTGOODS) ×1 IMPLANT
CANISTER SUCT 1200ML W/VALVE (MISCELLANEOUS) ×1 IMPLANT
CORD BIPOLAR FORCEPS 12FT (ELECTRODE) IMPLANT
COVER BACK TABLE 60X90IN (DRAPES) ×2 IMPLANT
COVER MAYO STAND STRL (DRAPES) ×2 IMPLANT
DECANTER SPIKE VIAL GLASS SM (MISCELLANEOUS) IMPLANT
DERMABOND ADVANCED (GAUZE/BANDAGES/DRESSINGS)
DERMABOND ADVANCED .7 DNX12 (GAUZE/BANDAGES/DRESSINGS) IMPLANT
DRAPE EXTREMITY T 121X128X90 (DRAPE) ×2 IMPLANT
DRAPE IMP U-DRAPE 54X76 (DRAPES) ×3 IMPLANT
DRAPE INCISE IOBAN 66X45 STRL (DRAPES) IMPLANT
DRAPE OEC MINIVIEW 54X84 (DRAPES) IMPLANT
DRAPE SURG 17X23 STRL (DRAPES) IMPLANT
DRAPE U-SHAPE 47X51 STRL (DRAPES) ×2 IMPLANT
DRSG EMULSION OIL 3X3 NADH (GAUZE/BANDAGES/DRESSINGS) IMPLANT
DRSG PAD ABDOMINAL 8X10 ST (GAUZE/BANDAGES/DRESSINGS) ×1 IMPLANT
DRSG TEGADERM 2-3/8X2-3/4 SM (GAUZE/BANDAGES/DRESSINGS) IMPLANT
DURAPREP 26ML APPLICATOR (WOUND CARE) ×2 IMPLANT
ELECT REM PT RETURN 9FT ADLT (ELECTROSURGICAL) ×2
ELECTRODE REM PT RTRN 9FT ADLT (ELECTROSURGICAL) ×1 IMPLANT
GAUZE SPONGE 4X4 12PLY STRL (GAUZE/BANDAGES/DRESSINGS) IMPLANT
GAUZE XEROFORM 1X8 LF (GAUZE/BANDAGES/DRESSINGS) ×1 IMPLANT
GLOVE BIOGEL PI IND STRL 6.5 (GLOVE) IMPLANT
GLOVE BIOGEL PI IND STRL 7.0 (GLOVE) IMPLANT
GLOVE BIOGEL PI IND STRL 8 (GLOVE) ×1 IMPLANT
GLOVE BIOGEL PI IND STRL 8.5 (GLOVE) ×1 IMPLANT
GLOVE BIOGEL PI IND STRL 9 (GLOVE) IMPLANT
GLOVE BIOGEL PI INDICATOR 6.5 (GLOVE) ×1
GLOVE BIOGEL PI INDICATOR 7.0 (GLOVE) ×1
GLOVE BIOGEL PI INDICATOR 8 (GLOVE) ×2
GLOVE BIOGEL PI INDICATOR 8.5 (GLOVE) ×1
GLOVE BIOGEL PI INDICATOR 9 (GLOVE) ×1
GLOVE ORTHO TXT STRL SZ7.5 (GLOVE) ×1 IMPLANT
GLOVE SURG SS PI 6.5 STRL IVOR (GLOVE) ×1 IMPLANT
GOWN STRL REUS W/ TWL LRG LVL3 (GOWN DISPOSABLE) ×1 IMPLANT
GOWN STRL REUS W/ TWL XL LVL3 (GOWN DISPOSABLE) ×1 IMPLANT
GOWN STRL REUS W/TWL LRG LVL3 (GOWN DISPOSABLE) ×2
GOWN STRL REUS W/TWL XL LVL3 (GOWN DISPOSABLE) ×2
NDL HYPO 25X1 1.5 SAFETY (NEEDLE) ×1 IMPLANT
NEEDLE HYPO 25X1 1.5 SAFETY (NEEDLE) ×2 IMPLANT
NS IRRIG 1000ML POUR BTL (IV SOLUTION) ×2 IMPLANT
PACK BASIN DAY SURGERY FS (CUSTOM PROCEDURE TRAY) ×2 IMPLANT
PAD CAST 3X4 CTTN HI CHSV (CAST SUPPLIES) ×1 IMPLANT
PAD CAST 4YDX4 CTTN HI CHSV (CAST SUPPLIES) IMPLANT
PADDING CAST ABS 3INX4YD NS (CAST SUPPLIES)
PADDING CAST ABS COTTON 3X4 (CAST SUPPLIES) ×1 IMPLANT
PADDING CAST COTTON 3X4 STRL (CAST SUPPLIES)
PADDING CAST COTTON 4X4 STRL (CAST SUPPLIES) ×2
PENCIL BUTTON HOLSTER BLD 10FT (ELECTRODE) ×2 IMPLANT
SHEET MEDIUM DRAPE 40X70 STRL (DRAPES) ×1 IMPLANT
SLEEVE SCD COMPRESS KNEE MED (MISCELLANEOUS) ×1 IMPLANT
SPONGE GAUZE 2X2 8PLY STRL LF (GAUZE/BANDAGES/DRESSINGS) ×2 IMPLANT
STAPLER VISISTAT 35W (STAPLE) ×1 IMPLANT
STOCKINETTE 4X48 STRL (DRAPES) IMPLANT
STOCKINETTE 6  STRL (DRAPES) ×1
STOCKINETTE 6 STRL (DRAPES) IMPLANT
STOCKINETTE IMPERVIOUS LG (DRAPES) IMPLANT
SUCTION FRAZIER HANDLE 10FR (MISCELLANEOUS) ×1
SUCTION TUBE FRAZIER 10FR DISP (MISCELLANEOUS) IMPLANT
SUT ETHILON 4 0 PS 2 18 (SUTURE) IMPLANT
SUT VIC AB 0 CT1 27 (SUTURE)
SUT VIC AB 0 CT1 27XBRD ANBCTR (SUTURE) IMPLANT
SUT VIC AB 2-0 CT1 27 (SUTURE) ×2
SUT VIC AB 2-0 CT1 TAPERPNT 27 (SUTURE) IMPLANT
SUT VIC AB 3-0 SH 27 (SUTURE) ×2
SUT VIC AB 3-0 SH 27X BRD (SUTURE) ×1 IMPLANT
SYR CONTROL 10ML LL (SYRINGE) ×1 IMPLANT
TUBE CONNECTING 20X1/4 (TUBING) ×1 IMPLANT
UNDERPAD 30X30 (UNDERPADS AND DIAPERS) ×2 IMPLANT

## 2017-12-19 NOTE — Discharge Instructions (Addendum)
Keep short leg splint and dressing dry. May use water impervious bag  and tub chair to shower Tape the top of bag to skin to avoid moisture soaking the dressing on the leg. Or after 3-4 days may remove dressing and bathe in shower no immersion, change bandage after bathing. Call if there is odor or saturation of dressing or worsening pain not controlled with medications. Call if fever greater than 101.5. Use crutches or walker 50% weight bearing on the ankle fracture leg. Please follow up with an appointment with Dr. Louanne Skye  2 weeks from the time of surgery. Elevate as often as possible during the first week after surgery gradually increasing the time the leg is dependent or down there after. If swelling recurrs then elevate again. Wheel chair for longer distances.    Post Anesthesia Home Care Instructions  Activity: Get plenty of rest for the remainder of the day. A responsible individual must stay with you for 24 hours following the procedure.  For the next 24 hours, DO NOT: -Drive a car -Paediatric nurse -Drink alcoholic beverages -Take any medication unless instructed by your physician -Make any legal decisions or sign important papers.  Meals: Start with liquid foods such as gelatin or soup. Progress to regular foods as tolerated. Avoid greasy, spicy, heavy foods. If nausea and/or vomiting occur, drink only clear liquids until the nausea and/or vomiting subsides. Call your physician if vomiting continues.  Special Instructions/Symptoms: Your throat may feel dry or sore from the anesthesia or the breathing tube placed in your throat during surgery. If this causes discomfort, gargle with warm salt water. The discomfort should disappear within 24 hours.  If you had a scopolamine patch placed behind your ear for the management of post- operative nausea and/or vomiting:  1. The medication in the patch is effective for 72 hours, after which it should be removed.  Wrap patch in a  tissue and discard in the trash. Wash hands thoroughly with soap and water. 2. You may remove the patch earlier than 72 hours if you experience unpleasant side effects which may include dry mouth, dizziness or visual disturbances. 3. Avoid touching the patch. Wash your hands with soap and water after contact with the patch.     Regional Anesthesia Blocks  1. Numbness or the inability to move the "blocked" extremity may last from 3-48 hours after placement. The length of time depends on the medication injected and your individual response to the medication. If the numbness is not going away after 48 hours, call your surgeon.  2. The extremity that is blocked will need to be protected until the numbness is gone and the  Strength has returned. Because you cannot feel it, you will need to take extra care to avoid injury. Because it may be weak, you may have difficulty moving it or using it. You may not know what position it is in without looking at it while the block is in effect.  3. For blocks in the legs and feet, returning to weight bearing and walking needs to be done carefully. You will need to wait until the numbness is entirely gone and the strength has returned. You should be able to move your leg and foot normally before you try and bear weight or walk. You will need someone to be with you when you first try to ensure you do not fall and possibly risk injury.  4. Bruising and tenderness at the needle site are common side effects and  will resolve in a few days.  5. Persistent numbness or new problems with movement should be communicated to the surgeon or the [place] [facility] [phone] [place] [phone]).    Information for Discharge Teaching: EXPAREL (bupivacaine liposome injectable suspension)   Your surgeon or anesthesiologist gave you EXPAREL(bupivacaine) to help control your pain after surgery.   EXPAREL is a local anesthetic that provides pain  relief by numbing the tissue around the surgical site.  EXPAREL is designed to release pain medication over time and can control pain for up to 72 hours.  Depending on how you respond to EXPAREL, you may require less pain medication during your recovery.  Possible side effects:  Temporary loss of sensation or ability to move in the area where bupivacaine was injected.  Nausea, vomiting, constipation  Rarely, numbness and tingling in your mouth or lips, lightheadedness, or anxiety may occur.  Call your doctor right away if you think you may be experiencing any of these sensations, or if you have other questions regarding possible side effects.  Follow all other discharge instructions given to you by your surgeon or nurse. Eat a healthy diet and drink plenty of water or other fluids.  If you return to the hospital for any reason within 96 hours following the administration of EXPAREL, it is important for health care providers to know that you have received this anesthetic. A teal colored band has been placed on your arm with the date, time and amount of EXPAREL you have received in order to alert and inform your health care providers. Please leave this armband in place for the full 96 hours following administration, and then you may remove the band.

## 2017-12-19 NOTE — Interval H&P Note (Signed)
History and Physical Interval Note:  12/19/2017 1:08 PM  Jesus Brown  has presented today for surgery, with the diagnosis of painful retained hardware right ankle  The various methods of treatment have been discussed with the patient and family. After consideration of risks, benefits and other options for treatment, the patient has consented to  Procedure(s): REMOVAL OF SMALL FRAGMENT PLATE AND SCREWS RIGHT LATERAL ANKLE (Right) as a surgical intervention .  The patient's history has been reviewed, patient examined, no change in status, stable for surgery.  I have reviewed the patient's chart and labs.  Questions were answered to the patient's satisfaction.     Basil Dess

## 2017-12-19 NOTE — Progress Notes (Signed)
Assisted Dr. Houser with right, ultrasound guided, popliteal block. Side rails up, monitors on throughout procedure. See vital signs in flow sheet. Tolerated Procedure well. 

## 2017-12-19 NOTE — Anesthesia Postprocedure Evaluation (Signed)
Anesthesia Post Note  Patient: Jesus Brown  Procedure(s) Performed: REMOVAL OF SMALL FRAGMENT PLATE AND SCREWS RIGHT LATERAL ANKLE (Right Ankle)     Patient location during evaluation: PACU Anesthesia Type: General Level of consciousness: awake and alert Pain management: pain level controlled Vital Signs Assessment: post-procedure vital signs reviewed and stable Respiratory status: spontaneous breathing, nonlabored ventilation, respiratory function stable and patient connected to nasal cannula oxygen Cardiovascular status: blood pressure returned to baseline and stable Postop Assessment: no apparent nausea or vomiting Anesthetic complications: no    Last Vitals:  Vitals:   12/19/17 1445 12/19/17 1512  BP: (!) 141/88 (!) 154/90  Pulse: 79 86  Resp: 15 18  Temp:  36.6 C  SpO2: 99% 100%    Last Pain:  Vitals:   12/19/17 1512  TempSrc: Oral  PainSc: 0-No pain                 Barnet Glasgow

## 2017-12-19 NOTE — Transfer of Care (Signed)
Immediate Anesthesia Transfer of Care Note  Patient: Jesus Brown  Procedure(s) Performed: REMOVAL OF SMALL FRAGMENT PLATE AND SCREWS RIGHT LATERAL ANKLE (Right Ankle)  Patient Location: PACU  Anesthesia Type:General  Level of Consciousness: awake and sedated  Airway & Oxygen Therapy: Patient Spontanous Breathing and Patient connected to face mask oxygen  Post-op Assessment: Report given to RN and Post -op Vital signs reviewed and stable  Post vital signs: Reviewed and stable  Last Vitals:  Vitals Value Taken Time  BP 120/70 12/19/2017  2:15 PM  Temp    Pulse 101 12/19/2017  2:16 PM  Resp 20 12/19/2017  2:16 PM  SpO2 100 % 12/19/2017  2:16 PM  Vitals shown include unvalidated device data.  Last Pain:  Vitals:   12/19/17 1145  TempSrc: Oral  PainSc: 4       Patients Stated Pain Goal: 3 (47/07/61 [id number])  Complications: No apparent anesthesia complications

## 2017-12-19 NOTE — Brief Op Note (Signed)
12/19/2017  2:11 PM  PATIENT:  Jesus Brown  52 y.o. male  PRE-OPERATIVE DIAGNOSIS:  painful retained hardware right ankle  POST-OPERATIVE DIAGNOSIS:  painful retained hardware right ankle  PROCEDURE:  Procedure(s): REMOVAL OF SMALL FRAGMENT PLATE AND SCREWS RIGHT LATERAL ANKLE (Right)  SURGEON:  Surgeon(s) and Role:    * Jessy Oto, MD - Primary  PHYSICIAN ASSISTANT: Benjiman Core, PA-C  ANESTHESIA:   local, general and MAC  EBL:  5 mL   BLOOD ADMINISTERED:none  DRAINS: none   LOCAL MEDICATIONS USED:  EXPAREL 1.3% Amount: 10 ml   SPECIMEN:  No Specimen  DISPOSITION OF SPECIMEN:  N/A  COUNTS:  YES  TOURNIQUET:   Total Tourniquet Time Documented: Thigh (Right) - 30 minutes Total: Thigh (Right) - 30 minutes   DICTATION: .Viviann Spare Dictation  PLAN OF CARE: Admit to inpatient   PATIENT DISPOSITION:  PACU - guarded condition.   Delay start of Pharmacological VTE agent (>24hrs) due to surgical blood loss or risk of bleeding: yes

## 2017-12-19 NOTE — H&P (Signed)
PREOPERATIVE H&P  Chief Complaint: painful retained hardware right ankle  HPI: Jesus Brown is a 52 y.o. male who presents for preoperative history and physical with a diagnosis of painful retained hardware right ankle. Symptoms are rated as moderate to severe, and have been worsening.  This is significantly impairing activities of daily living.  He has elected for surgical management. History of recurring cellulitis of the right leg. Previous MRI with edema about the right distal fibula about previous ORIF plate and screws.  ID recommended removal of place. He is post op total knee replacement and on chronic suppressive meds.  Will plan  Remove retained right lateral malleolus plate and screws to decrease tendency to recurrent cellutitis. Patient request the  Plate and screws following the procedure.   Past Medical History:  Diagnosis Date  . Anxiety    mainly r/t pain  . Arthritis    neck, knees  . Asthma    AS CHILD   . Back pain, chronic   . DDD (degenerative disc disease)   . Dysphagia   . 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 GERD (gastroesophageal reflux disease)   . Hypertension   . Hypotestosteronemia 09/08/2015  . Neck pain, chronic    Past Surgical History:  Procedure Laterality Date  . ANKLE SURGERY    . BACK SURGERY  2010,2011   lumb fusionx2  . CERVICAL FUSION  2130,8657   3 surgeries  . DIRECT LARYNGOSCOPY N/A 11/09/2012   Procedure: DIRECT LARYNGOSCOPY;  Surgeon: Jodi Marble, MD;  Location: [place];  Service: ENT;  Laterality: N/A;  . ESOPHAGOGASTRODUODENOSCOPY N/A 08/03/2012   Procedure: ESOPHAGOGASTRODUODENOSCOPY (EGD);  Surgeon: Arta Silence, MD;  Location: [facility] ENDOSCOPY;  Service: Endoscopy;  Laterality: N/A;  . ESOPHAGOSCOPY W/ BOTOX INJECTION N/A 11/09/2012   Procedure: ESOPHAGOSCOPY POSSIBLE BIOPSY AND POSSIBLE BOTOX INJECTION CRICOPHARYNGEUS;  Surgeon: Jodi Marble, MD;  Location: [place];  Service: ENT;   Laterality: N/A;  Esophagoscopy with botox injection  . HERNIA REPAIR     ing-rt  . KNEE SURGERY     rt and left x2 each=4  . POSTERIOR CERVICAL FUSION/FORAMINOTOMY N/A 03/25/2014   Procedure: LEFT C7-T1 FORAMINOTOMY;  Surgeon: Jessy Oto, MD;  Location: [place];  Service: Orthopedics;  Laterality: N/A;  . [place]   rt and lt  . TONSILLECTOMY    . TOTAL KNEE ARTHROPLASTY Left 07/19/2016   Procedure: LEFT TOTAL KNEE ARTHROPLASTY CORTISON INJECTION IN RIGHT KNEE;  Surgeon: Jessy Oto, MD;  Location: [place];  Service: Orthopedics;  Laterality: Left;  MAY NEED RNFA TO FINISH CASE PER [place]   . TOTAL KNEE ARTHROPLASTY Right 04/11/2017   Procedure: RIGHT TOTAL KNEE ARTHROPLASTY;  Surgeon: Jessy Oto, MD;  Location: [place];  Service: Orthopedics;  Laterality: Right;  . WRIST SURGERY     lt and rt   Social History   Socioeconomic History  . Marital status: Married    Spouse name: Not on file  . Number of children: Not on file  . Years of education: Not on file  . Highest education level: Not on file  Occupational History  . Not on file  Social Needs  . Financial resource strain: Not on file  . Food insecurity:    Worry: Not on file    Inability: Not on file  . Transportation needs:    Medical: Not on file    Non-medical: Not on file  Tobacco Use  .  Smoking status: Never Smoker  . Smokeless tobacco: Never Used  Substance and Sexual Activity  . Alcohol use: No  . Drug use: No  . Sexual activity: Not on file  Lifestyle  . Physical activity:    Days per week: Not on file    Minutes per session: Not on file  . Stress: Not on file  Relationships  . Social connections:    Talks on phone: Not on file    Gets together: Not on file    Attends religious service: Not on file    Active member of club or organization: Not on file    Attends meetings of clubs or organizations: Not on file    Relationship status: Not on file  Other Topics Concern  . Not on file   Social History Narrative  . Not on file   Family History  Problem Relation Age of Onset  . Hypertension Mother   . Heart attack Father    Allergies  Allergen Reactions  . Adhesive [Tape] Other (See Comments)    Blisters, NO STERI STRIPS  paper tape is ok  Tegaderm - caused redness, itching, and burning sensation to the site, use gauze dressing over IV sites  . Baclofen Nausea Only and Other (See Comments)    Aches and pains   . Indocin [Indomethacin] Other (See Comments)    Burns stomach   Prior to Admission medications   Medication Sig Start Date End Date Taking? Authorizing Provider  ANDROGEL PUMP 20.25 MG/ACT (1.62%) GEL  09/13/17  Yes [provider]  Aspirin-Acetaminophen-Caffeine (GOODY HEADACHE PO) Take 1 packet by mouth daily.    Yes [provider]  hydrochlorothiazide (HYDRODIURIL) 25 MG tablet Take 25 mg by mouth daily.  08/17/15  Yes [provider]  loratadine (CLARITIN) 10 MG tablet Take 10 mg by mouth daily.   Yes [provider]  morphine (AVINZA) 60 MG 24 hr capsule Take 45 mg by mouth daily.  04/06/17  Yes [provider]  morphine (MSIR) 15 MG tablet Take 1 tablet (15 mg total) by mouth every 4 (four) hours as needed (pai). Patient taking differently: Take 15 mg by mouth every 8 (eight) hours as needed (breakthrough pain).  03/25/14  Yes Lanae Crumbly, PA-C  omeprazole (PRILOSEC) 20 MG capsule Take 20 mg by mouth daily.    Yes [provider]  PRESCRIPTION MEDICATION Inject 2 Doses as directed once a week. Allergy shots   Yes [provider]  Testosterone (ANDROGEL) 20.25 MG/1.25GM (1.62%) GEL Place 2 Pump onto the skin daily.    Yes [provider]  valsartan (DIOVAN) 160 MG tablet Take 160 mg by mouth daily.  08/17/15  Yes [provider]  EPINEPHrine (EPIPEN 2-PAK) 0.3 mg/0.3 mL IJ SOAJ injection Inject 0.3 mg into the muscle as needed (allergic reaction).    [provider]      Positive ROS: All other systems have been reviewed and were otherwise negative with the exception of those mentioned in the HPI and as above.  Physical Exam: General: Alert, no acute distress Cardiovascular: No pedal edema Respiratory: No cyanosis, no use of accessory musculature GI: No organomegaly, abdomen is soft and non-tender Skin: No lesions in the area of chief complaint Neurologic: Sensation intact distally Psychiatric: Patient is competent for consent with normal mood and affect Lymphatic: No axillary or cervical lymphadenopathy  MUSCULOSKELETAL: Right lateral ankle with scar along the lateral distal fibula. Right leg is N-V normal, mild swelling of  the right leg knee down.  Plain radiographs with a retained 1/3rd semitubular plate and 5 3.5 mm screws.    Assessment: Painful retained hardware right ankle  Plan: Plan for Procedure(s): REMOVAL OF SMALL FRAGMENT PLATE AND SCREWS RIGHT LATERAL ANKLE  The risks benefits and alternatives were discussed with the patient including but not limited to the risks of nonoperative treatment, versus surgical intervention including infection, bleeding, nerve injury,  blood clots, cardiopulmonary complications, morbidity, mortality, among others, and they were willing to proceed.   Basil Dess, MD Cell [phone] Office [phone] 12/19/2017 1:03 PM

## 2017-12-19 NOTE — Anesthesia Procedure Notes (Signed)
Procedure Name: LMA Insertion Performed by: Terrance Mass, CRNA Pre-anesthesia Checklist: Patient identified, Emergency Drugs available, Suction available and Patient being monitored Patient Re-evaluated:Patient Re-evaluated prior to induction Oxygen Delivery Method: Circle system utilized Preoxygenation: Pre-oxygenation with 100% oxygen Induction Type: IV induction Ventilation: Mask ventilation without difficulty LMA: LMA inserted LMA Size: 5.0 Number of attempts: 1 Placement Confirmation: positive ETCO2 Tube secured with: Tape Dental Injury: Teeth and Oropharynx as per pre-operative assessment

## 2017-12-19 NOTE — Anesthesia Procedure Notes (Signed)
Anesthesia Regional Block: Popliteal block   Pre-Anesthetic Checklist: ,, timeout performed, Correct Patient, Correct Site, Correct Laterality, Correct Procedure, Correct Position, site marked, Risks and benefits discussed, pre-op evaluation,  At surgeon's request and post-op pain management  Laterality: Right  Prep: Maximum Sterile Barrier Precautions used, chloraprep       Needles:  Injection technique: Single-shot  Needle Type: Echogenic Needle     Needle Length: 9cm  Needle Gauge: 21     Additional Needles:   Procedures:,,,, ultrasound used (permanent image in chart),,,,  Narrative:  Start time: 12/19/2017 12:15 PM End time: 12/19/2017 12:25 PM Injection made incrementally with aspirations every 5 mL. Anesthesiologist: Barnet Glasgow, MD  Additional Notes: Pt tolerated procedure well.

## 2017-12-22 ENCOUNTER — Encounter (HOSPITAL_BASED_OUTPATIENT_CLINIC_OR_DEPARTMENT_OTHER): Payer: Self-pay | Admitting: Specialist

## 2017-12-22 DIAGNOSIS — Z79891 Long term (current) use of opiate analgesic: Secondary | ICD-10-CM | POA: Diagnosis not present

## 2017-12-22 DIAGNOSIS — M47812 Spondylosis without myelopathy or radiculopathy, cervical region: Secondary | ICD-10-CM | POA: Diagnosis not present

## 2017-12-22 DIAGNOSIS — M62830 Muscle spasm of back: Secondary | ICD-10-CM | POA: Diagnosis not present

## 2017-12-22 DIAGNOSIS — F419 Anxiety disorder, unspecified: Secondary | ICD-10-CM | POA: Diagnosis not present

## 2017-12-22 DIAGNOSIS — G894 Chronic pain syndrome: Secondary | ICD-10-CM | POA: Diagnosis not present

## 2017-12-23 DIAGNOSIS — J3081 Allergic rhinitis due to animal (cat) (dog) hair and dander: Secondary | ICD-10-CM | POA: Diagnosis not present

## 2017-12-23 DIAGNOSIS — J301 Allergic rhinitis due to pollen: Secondary | ICD-10-CM | POA: Diagnosis not present

## 2017-12-23 DIAGNOSIS — J3089 Other allergic rhinitis: Secondary | ICD-10-CM | POA: Diagnosis not present

## 2017-12-25 NOTE — Op Note (Signed)
12/19/2017  4:54 PM  PATIENT:  Jesus Brown  52 y.o. male  MRN: [id number]  OPERATIVE REPORT  PRE-OPERATIVE DIAGNOSIS:  painful retained hardware right ankle  POST-OPERATIVE DIAGNOSIS:  painful retained hardware right ankle  PROCEDURE:  Procedure(s): REMOVAL OF SMALL FRAGMENT PLATE AND SCREWS RIGHT LATERAL ANKLE    SURGEON:  Jessy Oto, MD     ASSISTANT: Benjiman Core, PA-C  (Present throughout the entire procedure and necessary for completion of procedure in a timely manner)     ANESTHESIA:  General,    COMPLICATIONS:  None.     COMPONENTS: 3.37m 1/3 Stainless steel semitubular plate 4 hole with 4 3.5 cortical screws removed.   PROCEDURE: The patient was met in the holding area, and the appropriate right foot and leg identified and marked with an "X" and my initials.The patient was then transported to OR and was placed on the operative table in a supine position. The patient was then placed under general anesthesia without difficulty. The patient received appropriate preoperative antibiotic prophylaxis Ancef.  Tourniquet was applied to the operative thigh. Leg was then prepped using sterile conditions and draped using sterile technique. A bump placed under the right buttock for internal rotation. Time-out procedure was called and correct  The right leg was elevated and Esmarch exsanguinated with a thigh  tourniquet elevated to 300 mmHg. incision was then made after timeout over the lateral aspect of the right fibula approximately 6 cm in length based at the expected fibular plate site approximately 2 cm proximal to the plafond. Incision was undermined proximally using a periosteal elevator in the subperiosteal plane also distally on the superficial portion of the fibula plate and posterior aspect.plate site identified and carefully exposed with a periosteal elevator. Each of the individual screws, 4 hole plate were then carefully removed difficulty with removing each of them has bone  ingrowth into the threads had occurred T. handle hexhead screwdriver. Plate was then carefully lifted off the bone using a periosteal elevator and mallet to obtain leverage and placing the periosteal elevator between plate and bone after carefully dissecting. The plate was then carefully lifted off the bone after freeing up soft tissue using a Cobb elevator. Hand ronguer used to debride osteophytic bone that had grown into each of the holes the plate and at the very end of the plate. These were smoothed irrigation was performed and the incision closed by approximating the deep subcutaneous layers with 2-0 Vicryl sutures superficial layer interrupted 2-0 Vicryl sutures and skin with stainless steel staples. Adaptic 4 x 4's then carefully wrapped to the skin with Kerlix Ace wrap applied and then a cam walking brace. Patient will be maintained at 50% weightbearing postop for 6 weeks. Tourniquet was released total tourniquet time was 20 minutes at 300 mm Hg. All instrument and sponge counts were correct patient was then reactivated extubated returned to his bed and to the recovery room in satisfactory condition.  Postoperative care the patient will maintain partial weightbearing 50% on the right lower gently with crutches or walker for 6 weeks. Then may begin full weightbearing at that time.       JMilas Schappell 12/25/2017, 4:54 PM

## 2018-01-01 ENCOUNTER — Ambulatory Visit
Admission: RE | Admit: 2018-01-01 | Discharge: 2018-01-01 | Disposition: A | Discharge status: Home / Self Care | Payer: PPO | Source: Ambulatory Visit | Attending: Surgery | Admitting: Surgery

## 2018-01-01 ENCOUNTER — Ambulatory Visit (INDEPENDENT_AMBULATORY_CARE_PROVIDER_SITE_OTHER): Payer: PPO | Admitting: Surgery

## 2018-01-01 ENCOUNTER — Encounter (INDEPENDENT_AMBULATORY_CARE_PROVIDER_SITE_OTHER): Payer: Self-pay | Admitting: Surgery

## 2018-01-01 DIAGNOSIS — M79604 Pain in right leg: Secondary | ICD-10-CM

## 2018-01-01 DIAGNOSIS — Z9889 Other specified postprocedural states: Secondary | ICD-10-CM | POA: Diagnosis not present

## 2018-01-01 DIAGNOSIS — M7989 Other specified soft tissue disorders: Secondary | ICD-10-CM

## 2018-01-01 DIAGNOSIS — T8484XA Pain due to internal orthopedic prosthetic devices, implants and grafts, initial encounter: Secondary | ICD-10-CM

## 2018-01-01 NOTE — Progress Notes (Signed)
Post-Op Visit Note   Patient: Jesus Brown           Date of Birth: 10-14-1965           MRN: [id number] Visit Date: 01/01/2018 PCP: Merrilee Seashore, MD   Assessment & Plan:  Chief Complaint:  Chief Complaint  Patient presents with  . Right Ankle - Routine Post Op    Patient is about 2 weeks postop right ankle hardware removal returns.  Patient has been up doing a lot of walking.  Has been having some increased swelling in his right lower extremity.  States that he has had chronic issues with right lower extremity swelling over the years.  Also has some calf soreness.  No complaints of chest pain shortness of breath.   Visit Diagnoses:  1. Painful orthopaedic hardware ([place])   2. Pain and swelling of right lower extremity     Plan: With the patient amount of swelling in his leg I recommend returning to the office next Monday for wound check and possible staple removal with Dr. Louanne Skye.  I stressed to him the importance of elevating foot above heart level as much as possible to help decrease swelling and to assure that wound heals properly.  With the pain in his calf and increased swelling we will also send him for venous Doppler to rule out DVT today.  I will await callback report.  Follow-Up Instructions: Return in about 4 days (around 01/05/2018) for With Dr. Louanne Skye for wound check and possible staple removal per [name].   Orders:  No orders of the defined types were placed in this encounter.  No orders of the defined types were placed in this encounter.   Imaging: No results found.  PMFS History: Patient Active Problem List   Diagnosis Date Noted  . Retained orthopedic hardware 12/19/2017    Class: Chronic  . Closed fracture of right ankle   . Cellulitis of right leg 05/03/2017  . Hypokalemia 05/03/2017  . Hypertension 05/03/2017  . GERD (gastroesophageal reflux disease) 05/03/2017  . Anxiety 05/03/2017  . S/P total knee replacement using cement, right 04/11/2017    . Status post total left knee replacement 09/26/2016  . Presence of left artificial knee joint 07/25/2016  . Pain and swelling of left lower leg 07/25/2016  . Bilateral primary osteoarthritis of knee 07/19/2016    Class: Chronic  . Unilateral primary osteoarthritis, right knee 07/19/2016  . 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 Hypotestosteronemia 09/08/2015  . Cervical spondylosis without myelopathy 03/25/2014    Class: Chronic  . Left cervical radiculopathy 03/25/2014    Class: Chronic  . Cervical spondylosis with radiculopathy 03/25/2014   Past Medical History:  Diagnosis Date  . Anxiety    mainly r/t pain  . Arthritis    neck, knees  . Asthma    AS CHILD   . Back pain, chronic   . DDD (degenerative disc disease)   . Dysphagia   . 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 GERD (gastroesophageal reflux disease)   . Hypertension   . Hypotestosteronemia 09/08/2015  . Neck pain, chronic     Family History  Problem Relation Age of Onset  . Hypertension Mother   . Heart attack Father     Past Surgical History:  Procedure Laterality Date  . ANKLE SURGERY    . BACK SURGERY  2010,2011   lumb fusionx2  . CERVICAL FUSION  2725,3664   3 surgeries  . DIRECT LARYNGOSCOPY  N/A 11/09/2012   Procedure: DIRECT LARYNGOSCOPY;  Surgeon: Jodi Marble, MD;  Location: [place];  Service: ENT;  Laterality: N/A;  . ESOPHAGOGASTRODUODENOSCOPY N/A 08/03/2012   Procedure: ESOPHAGOGASTRODUODENOSCOPY (EGD);  Surgeon: Arta Silence, MD;  Location: [facility] ENDOSCOPY;  Service: Endoscopy;  Laterality: N/A;  . ESOPHAGOSCOPY W/ BOTOX INJECTION N/A 11/09/2012   Procedure: ESOPHAGOSCOPY POSSIBLE BIOPSY AND POSSIBLE BOTOX INJECTION CRICOPHARYNGEUS;  Surgeon: Jodi Marble, MD;  Location: [place];  Service: ENT;  Laterality: N/A;  Esophagoscopy with botox injection  . HARDWARE REMOVAL Right 12/19/2017   Procedure: REMOVAL OF SMALL FRAGMENT PLATE AND  SCREWS RIGHT LATERAL ANKLE;  Surgeon: Jessy Oto, MD;  Location: [place];  Service: Orthopedics;  Laterality: Right;  . HERNIA REPAIR     ing-rt  . KNEE SURGERY     rt and left x2 each=4  . POSTERIOR CERVICAL FUSION/FORAMINOTOMY N/A 03/25/2014   Procedure: LEFT C7-T1 FORAMINOTOMY;  Surgeon: Jessy Oto, MD;  Location: [place];  Service: Orthopedics;  Laterality: N/A;  . [place]   rt and lt  . TONSILLECTOMY    . TOTAL KNEE ARTHROPLASTY Left 07/19/2016   Procedure: LEFT TOTAL KNEE ARTHROPLASTY CORTISON INJECTION IN RIGHT KNEE;  Surgeon: Jessy Oto, MD;  Location: [place];  Service: Orthopedics;  Laterality: Left;  MAY NEED RNFA TO FINISH CASE PER [place]   . TOTAL KNEE ARTHROPLASTY Right 04/11/2017   Procedure: RIGHT TOTAL KNEE ARTHROPLASTY;  Surgeon: Jessy Oto, MD;  Location: [place];  Service: Orthopedics;  Laterality: Right;  . WRIST SURGERY     lt and rt   Social History   Occupational History  . Not on file  Tobacco Use  . Smoking status: Never Smoker  . Smokeless tobacco: Never Used  Substance and Sexual Activity  . Alcohol use: No  . Drug use: No  . Sexual activity: Not on file    Exam Pleasant white male alert and oriented in no acute distress.  Patient does have moderate to marked swelling right lower extremity.  Proximal to mid calf moderate to markedly tender.  Staples are intact.  No drainage or signs of infection.  He does have a couple of areas right lower extremity with slight erythema.  Areas are not hot to the touch.

## 2018-01-05 ENCOUNTER — Encounter (INDEPENDENT_AMBULATORY_CARE_PROVIDER_SITE_OTHER): Payer: Self-pay | Admitting: Specialist

## 2018-01-05 ENCOUNTER — Ambulatory Visit (INDEPENDENT_AMBULATORY_CARE_PROVIDER_SITE_OTHER): Payer: PPO | Admitting: Specialist

## 2018-01-05 VITALS — BP 131/75 | HR 94 | Ht 68.0 in | Wt 230.0 lb

## 2018-01-05 DIAGNOSIS — Z9889 Other specified postprocedural states: Secondary | ICD-10-CM

## 2018-01-05 DIAGNOSIS — M4322 Fusion of spine, cervical region: Secondary | ICD-10-CM

## 2018-01-05 DIAGNOSIS — M4326 Fusion of spine, lumbar region: Secondary | ICD-10-CM

## 2018-01-05 NOTE — Progress Notes (Signed)
Post-Op Visit Note   Patient: Jesus Brown           Date of Birth: Jul 17, 1965           MRN: [id number] Visit Date: 01/05/2018 PCP: Merrilee Seashore, MD   Assessment & Plan:16 days post removal of hardware of right lateral ankle Venous insufficiency.  Chief Complaint:  Chief Complaint  Patient presents with  . Right Ankle - Routine Post Op, Wound Check   Visit Diagnoses:  1. History of removal of retained hardware   2. Fusion of spine, cervical region   3. Fusion of lumbar spine     Plan:    May remove brace at night and in shower and in recliner. Remove brace and perform ROM of the right ankle. Call if fever greater than 101.5. Use crutches or walker 50% weight bearing on the ankle fracture leg. Please follow up with an appointment with Dr. Louanne Skye 4 weeks from the time of surgery.  If swelling recurrs then elevate again.Wheel chair for longer distances.   Follow-Up Instructions: No follow-ups on file.   Orders:  No orders of the defined types were placed in this encounter.  No orders of the defined types were placed in this encounter.   Imaging: No results found.  PMFS History: Patient Active Problem List   Diagnosis Date Noted  . Retained orthopedic hardware 12/19/2017    Priority: High    Class: Chronic  . Bilateral primary osteoarthritis of knee 07/19/2016    Priority: High    Class: Chronic  . Cervical spondylosis without myelopathy 03/25/2014    Priority: High    Class: Chronic  . Left cervical radiculopathy 03/25/2014    Priority: High    Class: Chronic  . Closed fracture of right ankle   . Cellulitis of right leg 05/03/2017  . Hypokalemia 05/03/2017  . Hypertension 05/03/2017  . GERD (gastroesophageal reflux disease) 05/03/2017  . Anxiety 05/03/2017  . S/P total knee replacement using cement, right 04/11/2017  . Status post total left knee replacement 09/26/2016  . Presence of left artificial knee joint 07/25/2016  . Pain and swelling  of left lower leg 07/25/2016  . Unilateral primary osteoarthritis, right knee 07/19/2016  . 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 Hypotestosteronemia 09/08/2015  . Cervical spondylosis with radiculopathy 03/25/2014   Past Medical History:  Diagnosis Date  . Anxiety    mainly r/t pain  . Arthritis    neck, knees  . Asthma    AS CHILD   . Back pain, chronic   . DDD (degenerative disc disease)   . Dysphagia   . 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 GERD (gastroesophageal reflux disease)   . Hypertension   . Hypotestosteronemia 09/08/2015  . Neck pain, chronic     Family History  Problem Relation Age of Onset  . Hypertension Mother   . Heart attack Father     Past Surgical History:  Procedure Laterality Date  . ANKLE SURGERY    . BACK SURGERY  2010,2011   lumb fusionx2  . CERVICAL FUSION  8768,1157   3 surgeries  . DIRECT LARYNGOSCOPY N/A 11/09/2012   Procedure: DIRECT LARYNGOSCOPY;  Surgeon: Jodi Marble, MD;  Location: [place];  Service: ENT;  Laterality: N/A;  . ESOPHAGOGASTRODUODENOSCOPY N/A 08/03/2012   Procedure: ESOPHAGOGASTRODUODENOSCOPY (EGD);  Surgeon: Arta Silence, MD;  Location[facility] ENDOSCOPY;  Service: Endoscopy;  Laterality: N/A;  . ESOPHAGOSCOPY W/ BOTOX INJECTION N/A 11/09/2012  Procedure: ESOPHAGOSCOPY POSSIBLE BIOPSY AND POSSIBLE BOTOX INJECTION CRICOPHARYNGEUS;  Surgeon: Jodi Marble, MD;  Location: [place];  Service: ENT;  Laterality: N/A;  Esophagoscopy with botox injection  . HARDWARE REMOVAL Right 12/19/2017   Procedure: REMOVAL OF SMALL FRAGMENT PLATE AND SCREWS RIGHT LATERAL ANKLE;  Surgeon: Jessy Oto, MD;  Location: [place];  Service: Orthopedics;  Laterality: Right;  . HERNIA REPAIR     ing-rt  . KNEE SURGERY     rt and left x2 each=4  . POSTERIOR CERVICAL FUSION/FORAMINOTOMY N/A 03/25/2014   Procedure: LEFT C7-T1 FORAMINOTOMY;  Surgeon: Jessy Oto, MD;   Location: [place];  Service: Orthopedics;  Laterality: N/A;  . [place]   rt and lt  . TONSILLECTOMY    . TOTAL KNEE ARTHROPLASTY Left 07/19/2016   Procedure: LEFT TOTAL KNEE ARTHROPLASTY CORTISON INJECTION IN RIGHT KNEE;  Surgeon: Jessy Oto, MD;  Location: [place];  Service: Orthopedics;  Laterality: Left;  MAY NEED RNFA TO FINISH CASE PER [place]   . TOTAL KNEE ARTHROPLASTY Right 04/11/2017   Procedure: RIGHT TOTAL KNEE ARTHROPLASTY;  Surgeon: Jessy Oto, MD;  Location: [place];  Service: Orthopedics;  Laterality: Right;  . WRIST SURGERY     lt and rt   Social History   Occupational History  . Not on file  Tobacco Use  . Smoking status: Never Smoker  . Smokeless tobacco: Never Used  Substance and Sexual Activity  . Alcohol use: No  . Drug use: No  . Sexual activity: Not on file

## 2018-01-05 NOTE — Patient Instructions (Signed)
May remove brace at night and in shower and in recliner. Remove brace and perform ROM of the right ankle. Call if fever greater than 101.5. Use crutches or walker 50% weight bearing on the ankle fracture leg. Please follow up with an appointment with Dr. Louanne Skye 4 weeks from the time of surgery.  If swelling recurrs then elevate again.Wheel chair for longer distances.

## 2018-01-08 DIAGNOSIS — J3081 Allergic rhinitis due to animal (cat) (dog) hair and dander: Secondary | ICD-10-CM | POA: Diagnosis not present

## 2018-01-08 DIAGNOSIS — J3089 Other allergic rhinitis: Secondary | ICD-10-CM | POA: Diagnosis not present

## 2018-01-08 DIAGNOSIS — J301 Allergic rhinitis due to pollen: Secondary | ICD-10-CM | POA: Diagnosis not present

## 2018-01-13 DIAGNOSIS — J3081 Allergic rhinitis due to animal (cat) (dog) hair and dander: Secondary | ICD-10-CM | POA: Diagnosis not present

## 2018-01-13 DIAGNOSIS — J3089 Other allergic rhinitis: Secondary | ICD-10-CM | POA: Diagnosis not present

## 2018-01-13 DIAGNOSIS — J301 Allergic rhinitis due to pollen: Secondary | ICD-10-CM | POA: Diagnosis not present

## 2018-01-20 DIAGNOSIS — F419 Anxiety disorder, unspecified: Secondary | ICD-10-CM | POA: Diagnosis not present

## 2018-01-20 DIAGNOSIS — G894 Chronic pain syndrome: Secondary | ICD-10-CM | POA: Diagnosis not present

## 2018-01-20 DIAGNOSIS — M47812 Spondylosis without myelopathy or radiculopathy, cervical region: Secondary | ICD-10-CM | POA: Diagnosis not present

## 2018-01-20 DIAGNOSIS — M62830 Muscle spasm of back: Secondary | ICD-10-CM | POA: Diagnosis not present

## 2018-01-23 DIAGNOSIS — J3081 Allergic rhinitis due to animal (cat) (dog) hair and dander: Secondary | ICD-10-CM | POA: Diagnosis not present

## 2018-01-23 DIAGNOSIS — J301 Allergic rhinitis due to pollen: Secondary | ICD-10-CM | POA: Diagnosis not present

## 2018-01-23 DIAGNOSIS — J3089 Other allergic rhinitis: Secondary | ICD-10-CM | POA: Diagnosis not present

## 2018-02-04 DIAGNOSIS — J3081 Allergic rhinitis due to animal (cat) (dog) hair and dander: Secondary | ICD-10-CM | POA: Diagnosis not present

## 2018-02-04 DIAGNOSIS — J301 Allergic rhinitis due to pollen: Secondary | ICD-10-CM | POA: Diagnosis not present

## 2018-02-04 DIAGNOSIS — J3089 Other allergic rhinitis: Secondary | ICD-10-CM | POA: Diagnosis not present

## 2018-02-04 DIAGNOSIS — M7552 Bursitis of left shoulder: Secondary | ICD-10-CM | POA: Diagnosis not present

## 2018-02-13 DIAGNOSIS — J3089 Other allergic rhinitis: Secondary | ICD-10-CM | POA: Diagnosis not present

## 2018-02-13 DIAGNOSIS — J3081 Allergic rhinitis due to animal (cat) (dog) hair and dander: Secondary | ICD-10-CM | POA: Diagnosis not present

## 2018-02-13 DIAGNOSIS — J301 Allergic rhinitis due to pollen: Secondary | ICD-10-CM | POA: Diagnosis not present

## 2018-02-17 DIAGNOSIS — F419 Anxiety disorder, unspecified: Secondary | ICD-10-CM | POA: Diagnosis not present

## 2018-02-17 DIAGNOSIS — G894 Chronic pain syndrome: Secondary | ICD-10-CM | POA: Diagnosis not present

## 2018-02-17 DIAGNOSIS — M62830 Muscle spasm of back: Secondary | ICD-10-CM | POA: Diagnosis not present

## 2018-02-17 DIAGNOSIS — M47812 Spondylosis without myelopathy or radiculopathy, cervical region: Secondary | ICD-10-CM | POA: Diagnosis not present

## 2018-02-19 ENCOUNTER — Encounter (INDEPENDENT_AMBULATORY_CARE_PROVIDER_SITE_OTHER): Payer: Self-pay | Admitting: Specialist

## 2018-02-19 ENCOUNTER — Ambulatory Visit (INDEPENDENT_AMBULATORY_CARE_PROVIDER_SITE_OTHER): Payer: PPO | Admitting: Specialist

## 2018-02-19 VITALS — BP 119/75 | HR 87 | Ht 68.0 in | Wt 230.0 lb

## 2018-02-19 DIAGNOSIS — M48062 Spinal stenosis, lumbar region with neurogenic claudication: Secondary | ICD-10-CM

## 2018-02-19 DIAGNOSIS — T8484XA Pain due to internal orthopedic prosthetic devices, implants and grafts, initial encounter: Secondary | ICD-10-CM

## 2018-02-19 DIAGNOSIS — M4322 Fusion of spine, cervical region: Secondary | ICD-10-CM

## 2018-02-19 NOTE — Patient Instructions (Signed)
Plan: May full weight bear on the right ankle. Use eucerin cream for dry skin. Consider CBD oil for pain in capsule form, amazon.com 5000mg  capsules 1-2 per day. Ask your pain management before ordering. Use the jobst stocking wearing during the day. No stockings at night.

## 2018-02-19 NOTE — Progress Notes (Signed)
Post-Op Visit Note   Patient: Jesus Brown           Date of Birth: [birth year]           MRN: [id number] Visit Date: 02/19/2018 PCP: Merrilee Seashore, MD   Assessment & Plan: 2 months post removal of hardware right lateral ankle.  Chief Complaint:  Chief Complaint  Patient presents with  . Right Ankle - Follow-up, Routine Post Op   Visit Diagnoses:  1. Painful orthopaedic hardware [facility])   Right ankle incision is healed with some scaling distally.   Plan: May full weight bear on the right ankle. Use eucerin cream for dry skin. Consider CBD oil for pain in capsule form, amazon.com 5000mg  capsules 1-2 per day. Ask your pain management before ordering. Use the jobst stocking wearing during the day. No stockings at night.   Follow-Up Instructions: No follow-ups on file.   Orders:  No orders of the defined types were placed in this encounter.  No orders of the defined types were placed in this encounter.   Imaging: No results found.  PMFS History: Patient Active Problem List   Diagnosis Date Noted  . Retained orthopedic hardware 12/19/2017    Priority: High    Class: Chronic  . Bilateral primary osteoarthritis of knee 07/19/2016    Priority: High    Class: Chronic  . Cervical spondylosis without myelopathy 03/25/2014    Priority: High    Class: Chronic  . Left cervical radiculopathy 03/25/2014    Priority: High    Class: Chronic  . Closed fracture of right ankle   . Cellulitis of right leg 05/03/2017  . Hypokalemia 05/03/2017  . Hypertension 05/03/2017  . GERD (gastroesophageal reflux disease) 05/03/2017  . Anxiety 05/03/2017  . S/P total knee replacement using cement, right 04/11/2017  . Status post total left knee replacement 09/26/2016  . Presence of left artificial knee joint 07/25/2016  . Pain and swelling of left lower leg 07/25/2016  . Unilateral primary osteoarthritis, right knee 07/19/2016  . 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  Hypotestosteronemia 09/08/2015  . Cervical spondylosis with radiculopathy 03/25/2014   Past Medical History:  Diagnosis Date  . Anxiety    mainly r/t pain  . Arthritis    neck, knees  . Asthma    AS CHILD   . Back pain, chronic   . DDD (degenerative disc disease)   . Dysphagia   . 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 GERD (gastroesophageal reflux disease)   . Hypertension   . Hypotestosteronemia 09/08/2015  . Neck pain, chronic     Family History  Problem Relation Age of Onset  . Hypertension Mother   . Heart attack Father     Past Surgical History:  Procedure Laterality Date  . ANKLE SURGERY    . BACK SURGERY  2010,2011   lumb fusionx2  . CERVICAL FUSION  0865,7846   3 surgeries  . DIRECT LARYNGOSCOPY N/A 11/09/2012   Procedure: DIRECT LARYNGOSCOPY;  Surgeon: Jodi Marble, MD;  Location: [place];  Service: ENT;  Laterality: N/A;  . ESOPHAGOGASTRODUODENOSCOPY N/A 08/03/2012   Procedure: ESOPHAGOGASTRODUODENOSCOPY (EGD);  Surgeon: Arta Silence, MD;  Location: [facility] ENDOSCOPY;  Service: Endoscopy;  Laterality: N/A;  . ESOPHAGOSCOPY W/ BOTOX INJECTION N/A 11/09/2012   Procedure: ESOPHAGOSCOPY POSSIBLE BIOPSY AND POSSIBLE BOTOX INJECTION CRICOPHARYNGEUS;  Surgeon: Jodi Marble, MD;  Location: [place];  Service: ENT;  Laterality: N/A;  Esophagoscopy with botox injection  .  HARDWARE REMOVAL Right 12/19/2017   Procedure: REMOVAL OF SMALL FRAGMENT PLATE AND SCREWS RIGHT LATERAL ANKLE;  Surgeon: Jessy Oto, MD;  Location: [place];  Service: Orthopedics;  Laterality: Right;  . HERNIA REPAIR     ing-rt  . KNEE SURGERY     rt and left x2 each=4  . POSTERIOR CERVICAL FUSION/FORAMINOTOMY N/A 03/25/2014   Procedure: LEFT C7-T1 FORAMINOTOMY;  Surgeon: Jessy Oto, MD;  Location: [place];  Service: Orthopedics;  Laterality: N/A;  . [place]   rt and lt  . TONSILLECTOMY    . TOTAL KNEE ARTHROPLASTY Left  07/19/2016   Procedure: LEFT TOTAL KNEE ARTHROPLASTY CORTISON INJECTION IN RIGHT KNEE;  Surgeon: Jessy Oto, MD;  Location: [place];  Service: Orthopedics;  Laterality: Left;  MAY NEED RNFA TO FINISH CASE PER [place]   . TOTAL KNEE ARTHROPLASTY Right 04/11/2017   Procedure: RIGHT TOTAL KNEE ARTHROPLASTY;  Surgeon: Jessy Oto, MD;  Location: [place];  Service: Orthopedics;  Laterality: Right;  . WRIST SURGERY     lt and rt   Social History   Occupational History  . Not on file  Tobacco Use  . Smoking status: Never Smoker  . Smokeless tobacco: Never Used  Substance and Sexual Activity  . Alcohol use: No  . Drug use: No  . Sexual activity: Not on file

## 2018-02-20 DIAGNOSIS — J301 Allergic rhinitis due to pollen: Secondary | ICD-10-CM | POA: Diagnosis not present

## 2018-02-20 DIAGNOSIS — J3081 Allergic rhinitis due to animal (cat) (dog) hair and dander: Secondary | ICD-10-CM | POA: Diagnosis not present

## 2018-02-20 DIAGNOSIS — J3089 Other allergic rhinitis: Secondary | ICD-10-CM | POA: Diagnosis not present

## 2018-02-24 ENCOUNTER — Other Ambulatory Visit: Payer: Self-pay | Admitting: Physical Medicine and Rehabilitation

## 2018-02-24 DIAGNOSIS — M75100 Unspecified rotator cuff tear or rupture of unspecified shoulder, not specified as traumatic: Secondary | ICD-10-CM

## 2018-02-27 DIAGNOSIS — J301 Allergic rhinitis due to pollen: Secondary | ICD-10-CM | POA: Diagnosis not present

## 2018-02-27 DIAGNOSIS — J3089 Other allergic rhinitis: Secondary | ICD-10-CM | POA: Diagnosis not present

## 2018-02-27 DIAGNOSIS — J3081 Allergic rhinitis due to animal (cat) (dog) hair and dander: Secondary | ICD-10-CM | POA: Diagnosis not present

## 2018-03-07 ENCOUNTER — Other Ambulatory Visit: Payer: PPO

## 2018-03-12 DIAGNOSIS — I10 Essential (primary) hypertension: Secondary | ICD-10-CM | POA: Diagnosis not present

## 2018-03-12 DIAGNOSIS — J3081 Allergic rhinitis due to animal (cat) (dog) hair and dander: Secondary | ICD-10-CM | POA: Diagnosis not present

## 2018-03-12 DIAGNOSIS — Z125 Encounter for screening for malignant neoplasm of prostate: Secondary | ICD-10-CM | POA: Diagnosis not present

## 2018-03-12 DIAGNOSIS — Z0000 Encounter for general adult medical examination without abnormal findings: Secondary | ICD-10-CM | POA: Diagnosis not present

## 2018-03-12 DIAGNOSIS — J3089 Other allergic rhinitis: Secondary | ICD-10-CM | POA: Diagnosis not present

## 2018-03-12 DIAGNOSIS — J301 Allergic rhinitis due to pollen: Secondary | ICD-10-CM | POA: Diagnosis not present

## 2018-03-12 DIAGNOSIS — R7301 Impaired fasting glucose: Secondary | ICD-10-CM | POA: Diagnosis not present

## 2018-03-17 DIAGNOSIS — J3081 Allergic rhinitis due to animal (cat) (dog) hair and dander: Secondary | ICD-10-CM | POA: Diagnosis not present

## 2018-03-17 DIAGNOSIS — J301 Allergic rhinitis due to pollen: Secondary | ICD-10-CM | POA: Diagnosis not present

## 2018-03-17 DIAGNOSIS — J3089 Other allergic rhinitis: Secondary | ICD-10-CM | POA: Diagnosis not present

## 2018-03-18 DIAGNOSIS — G894 Chronic pain syndrome: Secondary | ICD-10-CM | POA: Diagnosis not present

## 2018-03-18 DIAGNOSIS — F419 Anxiety disorder, unspecified: Secondary | ICD-10-CM | POA: Diagnosis not present

## 2018-03-18 DIAGNOSIS — M47812 Spondylosis without myelopathy or radiculopathy, cervical region: Secondary | ICD-10-CM | POA: Diagnosis not present

## 2018-03-18 DIAGNOSIS — M62830 Muscle spasm of back: Secondary | ICD-10-CM | POA: Diagnosis not present

## 2018-03-19 DIAGNOSIS — E291 Testicular hypofunction: Secondary | ICD-10-CM | POA: Diagnosis not present

## 2018-03-19 DIAGNOSIS — Z23 Encounter for immunization: Secondary | ICD-10-CM | POA: Diagnosis not present

## 2018-03-19 DIAGNOSIS — R7301 Impaired fasting glucose: Secondary | ICD-10-CM | POA: Diagnosis not present

## 2018-03-19 DIAGNOSIS — I10 Essential (primary) hypertension: Secondary | ICD-10-CM | POA: Diagnosis not present

## 2018-03-21 ENCOUNTER — Ambulatory Visit
Admission: RE | Admit: 2018-03-21 | Discharge: 2018-03-21 | Disposition: A | Discharge status: Home / Self Care | Payer: PPO | Source: Ambulatory Visit | Attending: Physical Medicine and Rehabilitation | Admitting: Physical Medicine and Rehabilitation

## 2018-03-21 DIAGNOSIS — M75100 Unspecified rotator cuff tear or rupture of unspecified shoulder, not specified as traumatic: Secondary | ICD-10-CM

## 2018-03-21 DIAGNOSIS — M75122 Complete rotator cuff tear or rupture of left shoulder, not specified as traumatic: Secondary | ICD-10-CM | POA: Diagnosis not present

## 2018-03-27 DIAGNOSIS — Z1212 Encounter for screening for malignant neoplasm of rectum: Secondary | ICD-10-CM | POA: Diagnosis not present

## 2018-03-27 DIAGNOSIS — Z1211 Encounter for screening for malignant neoplasm of colon: Secondary | ICD-10-CM | POA: Diagnosis not present

## 2018-04-09 DIAGNOSIS — M7712 Lateral epicondylitis, left elbow: Secondary | ICD-10-CM | POA: Diagnosis not present

## 2018-04-09 DIAGNOSIS — J301 Allergic rhinitis due to pollen: Secondary | ICD-10-CM | POA: Diagnosis not present

## 2018-04-09 DIAGNOSIS — J3089 Other allergic rhinitis: Secondary | ICD-10-CM | POA: Diagnosis not present

## 2018-04-09 DIAGNOSIS — J3081 Allergic rhinitis due to animal (cat) (dog) hair and dander: Secondary | ICD-10-CM | POA: Diagnosis not present

## 2018-04-10 ENCOUNTER — Encounter (INDEPENDENT_AMBULATORY_CARE_PROVIDER_SITE_OTHER): Payer: Self-pay | Admitting: Orthopedic Surgery

## 2018-04-10 ENCOUNTER — Ambulatory Visit (INDEPENDENT_AMBULATORY_CARE_PROVIDER_SITE_OTHER): Payer: PPO | Admitting: Orthopedic Surgery

## 2018-04-10 VITALS — Ht 68.0 in | Wt 230.0 lb

## 2018-04-10 DIAGNOSIS — M75122 Complete rotator cuff tear or rupture of left shoulder, not specified as traumatic: Secondary | ICD-10-CM | POA: Diagnosis not present

## 2018-04-12 ENCOUNTER — Encounter (INDEPENDENT_AMBULATORY_CARE_PROVIDER_SITE_OTHER): Payer: Self-pay | Admitting: Orthopedic Surgery

## 2018-04-12 NOTE — Progress Notes (Signed)
Office Visit Note   Patient: Jesus Brown           Date of Birth: 1966-02-19           MRN: [id number] Visit Date: 04/10/2018 Requested by: Merrilee Seashore, [address] [place], [place] [address] PCP: Merrilee Seashore, MD  Subjective: Chief Complaint  Patient presents with  . Left Shoulder - Pain    HPI: Changes the patient with left shoulder pain.  MRI scan has been performed since he was last seen.  This shows full-thickness tear of the anterior and far lateral supraspinatus tendon measuring 1.3 cm from front the back with about 1 to 2 cm of retraction.  He does have a history of prior surgery in 1999 which was impingement type surgery.  He cannot sleep on the left-hand side.  He has had pain in that left shoulder for several years.  Patient is disabled and takes a diminishing amount but still high level of morphine sulfate from pain management.  He has had bilateral knee replacements during this time when he has been on morphine and he has been able to manage that pain.  This does interfere with his activities of daily living.              ROS: All systems reviewed are negative as they relate to the chief complaint within the history of present illness.  Patient denies  fevers or chills.   Assessment & Plan: Visit Diagnoses:  1. Nontraumatic complete tear of left rotator cuff     Plan: Impression is left shoulder pain with rotator cuff tear and failure of conservative management.  Been going on for several years.  Discussed with him today operative and nonoperative treatment of this particular problem.  I do not think this rotator cuff tear with the amount of retraction it has will heal on its own.  Conceivably it may or may not become more symptomatic over time but I do think that it has a high likelihood of enlarging gradually over the next several years.  Surgical intervention could be performed but that would be a painful experience for this patient based  on the amount of pain medication he is taking.  Patient understands the risk and benefits of surgical intervention including but not limited to infection stiffness incomplete healing as well as potential need for surgery.  He is going to discuss it with his wife and if he wants to schedule we provided him with Debbie's card today.  Follow-Up Instructions: Return if symptoms worsen or fail to improve.   Orders:  No orders of the defined types were placed in this encounter.  No orders of the defined types were placed in this encounter.     Procedures: No procedures performed   Clinical Data: No additional findings.  Objective: Vital Signs: Ht 5\' 8"  (1.727 m)   Wt 230 lb (104.3 kg)   BMI 34.97 kg/m   Physical Exam:   Constitutional: Patient appears well-developed HEENT:  Head: Normocephalic Eyes:EOM are normal Neck: Normal range of motion Cardiovascular: Normal rate Pulmonary/chest: Effort normal Neurologic: Patient is alert Skin: Skin is warm Psychiatric: Patient has normal mood and affect    Ortho Exam: Ortho exam demonstrates full active and passive range of motion of the right shoulder.  Left shoulder has some weakness to supraspinatus testing and a little bit of coarseness and grinding with internal/external rotation at 90 degrees of abduction.  O'Brien's testing is negative.  Impingement signs  negative.  Has had prior distal clavicle excision but has a little bit of pain around the [facility] joint.  No evidence of frozen shoulder at this time  Specialty Comments:  No specialty comments available.  Imaging: No results found.   PMFS History: Patient Active Problem List   Diagnosis Date Noted  . Retained orthopedic hardware 12/19/2017    Class: Chronic  . Closed fracture of right ankle   . Cellulitis of right leg 05/03/2017  . Hypokalemia 05/03/2017  . Hypertension 05/03/2017  . GERD (gastroesophageal reflux disease) 05/03/2017  . Anxiety 05/03/2017  . S/P total  knee replacement using cement, right 04/11/2017  . Status post total left knee replacement 09/26/2016  . Presence of left artificial knee joint 07/25/2016  . Pain and swelling of left lower leg 07/25/2016  . Bilateral primary osteoarthritis of knee 07/19/2016    Class: Chronic  . Unilateral primary osteoarthritis, right knee 07/19/2016  . 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 Hypotestosteronemia 09/08/2015  . Cervical spondylosis without myelopathy 03/25/2014    Class: Chronic  . Left cervical radiculopathy 03/25/2014    Class: Chronic  . Cervical spondylosis with radiculopathy 03/25/2014   Past Medical History:  Diagnosis Date  . Anxiety    mainly r/t pain  . Arthritis    neck, knees  . Asthma    AS CHILD   . Back pain, chronic   . DDD (degenerative disc disease)   . Dysphagia   . 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 GERD (gastroesophageal reflux disease)   . Hypertension   . Hypotestosteronemia 09/08/2015  . Neck pain, chronic     Family History  Problem Relation Age of Onset  . Hypertension Mother   . Heart attack Father     Past Surgical History:  Procedure Laterality Date  . ANKLE SURGERY    . BACK SURGERY  2010,2011   lumb fusionx2  . CERVICAL FUSION  9983,3825   3 surgeries  . DIRECT LARYNGOSCOPY N/A 11/09/2012   Procedure: DIRECT LARYNGOSCOPY;  Surgeon: Jodi Marble, MD;  Location: [place];  Service: ENT;  Laterality: N/A;  . ESOPHAGOGASTRODUODENOSCOPY N/A 08/03/2012   Procedure: ESOPHAGOGASTRODUODENOSCOPY (EGD);  Surgeon: Arta Silence, MD;  Location: [facility] ENDOSCOPY;  Service: Endoscopy;  Laterality: N/A;  . ESOPHAGOSCOPY W/ BOTOX INJECTION N/A 11/09/2012   Procedure: ESOPHAGOSCOPY POSSIBLE BIOPSY AND POSSIBLE BOTOX INJECTION CRICOPHARYNGEUS;  Surgeon: Jodi Marble, MD;  Location: [place];  Service: ENT;  Laterality: N/A;  Esophagoscopy with botox injection  . HARDWARE REMOVAL Right 12/19/2017     Procedure: REMOVAL OF SMALL FRAGMENT PLATE AND SCREWS RIGHT LATERAL ANKLE;  Surgeon: Jessy Oto, MD;  Location: [place];  Service: Orthopedics;  Laterality: Right;  . HERNIA REPAIR     ing-rt  . KNEE SURGERY     rt and left x2 each=4  . POSTERIOR CERVICAL FUSION/FORAMINOTOMY N/A 03/25/2014   Procedure: LEFT C7-T1 FORAMINOTOMY;  Surgeon: Jessy Oto, MD;  Location: [place];  Service: Orthopedics;  Laterality: N/A;  . [place]   rt and lt  . TONSILLECTOMY    . TOTAL KNEE ARTHROPLASTY Left 07/19/2016   Procedure: LEFT TOTAL KNEE ARTHROPLASTY CORTISON INJECTION IN RIGHT KNEE;  Surgeon: Jessy Oto, MD;  Location: [place];  Service: Orthopedics;  Laterality: Left;  MAY NEED RNFA TO FINISH CASE PER [place]   . TOTAL KNEE ARTHROPLASTY Right 04/11/2017   Procedure: RIGHT TOTAL KNEE ARTHROPLASTY;  Surgeon:  Jessy Oto, MD;  Location: [place];  Service: Orthopedics;  Laterality: Right;  . WRIST SURGERY     lt and rt   Social History   Occupational History  . Not on file  Tobacco Use  . Smoking status: Never Smoker  . Smokeless tobacco: Never Used  Substance and Sexual Activity  . Alcohol use: No  . Drug use: No  . Sexual activity: Not on file

## 2018-04-15 DIAGNOSIS — M47812 Spondylosis without myelopathy or radiculopathy, cervical region: Secondary | ICD-10-CM | POA: Diagnosis not present

## 2018-04-15 DIAGNOSIS — M62830 Muscle spasm of back: Secondary | ICD-10-CM | POA: Diagnosis not present

## 2018-04-15 DIAGNOSIS — G894 Chronic pain syndrome: Secondary | ICD-10-CM | POA: Diagnosis not present

## 2018-04-15 DIAGNOSIS — F419 Anxiety disorder, unspecified: Secondary | ICD-10-CM | POA: Diagnosis not present

## 2018-04-20 DIAGNOSIS — J301 Allergic rhinitis due to pollen: Secondary | ICD-10-CM | POA: Diagnosis not present

## 2018-04-20 DIAGNOSIS — J3089 Other allergic rhinitis: Secondary | ICD-10-CM | POA: Diagnosis not present

## 2018-04-20 DIAGNOSIS — J3081 Allergic rhinitis due to animal (cat) (dog) hair and dander: Secondary | ICD-10-CM | POA: Diagnosis not present

## 2018-04-28 DIAGNOSIS — G5603 Carpal tunnel syndrome, bilateral upper limbs: Secondary | ICD-10-CM | POA: Diagnosis not present

## 2018-04-28 DIAGNOSIS — M5412 Radiculopathy, cervical region: Secondary | ICD-10-CM | POA: Diagnosis not present

## 2018-04-29 DIAGNOSIS — J3089 Other allergic rhinitis: Secondary | ICD-10-CM | POA: Diagnosis not present

## 2018-04-29 DIAGNOSIS — J3081 Allergic rhinitis due to animal (cat) (dog) hair and dander: Secondary | ICD-10-CM | POA: Diagnosis not present

## 2018-04-29 DIAGNOSIS — J301 Allergic rhinitis due to pollen: Secondary | ICD-10-CM | POA: Diagnosis not present

## 2018-05-14 DIAGNOSIS — M62830 Muscle spasm of back: Secondary | ICD-10-CM | POA: Diagnosis not present

## 2018-05-14 DIAGNOSIS — G894 Chronic pain syndrome: Secondary | ICD-10-CM | POA: Diagnosis not present

## 2018-05-14 DIAGNOSIS — F419 Anxiety disorder, unspecified: Secondary | ICD-10-CM | POA: Diagnosis not present

## 2018-05-14 DIAGNOSIS — M47812 Spondylosis without myelopathy or radiculopathy, cervical region: Secondary | ICD-10-CM | POA: Diagnosis not present

## 2018-05-19 DIAGNOSIS — J301 Allergic rhinitis due to pollen: Secondary | ICD-10-CM | POA: Diagnosis not present

## 2018-05-19 DIAGNOSIS — J3081 Allergic rhinitis due to animal (cat) (dog) hair and dander: Secondary | ICD-10-CM | POA: Diagnosis not present

## 2018-05-19 DIAGNOSIS — J3089 Other allergic rhinitis: Secondary | ICD-10-CM | POA: Diagnosis not present

## 2018-05-21 ENCOUNTER — Ambulatory Visit (INDEPENDENT_AMBULATORY_CARE_PROVIDER_SITE_OTHER): Payer: Self-pay

## 2018-05-21 ENCOUNTER — Encounter (INDEPENDENT_AMBULATORY_CARE_PROVIDER_SITE_OTHER): Payer: Self-pay | Admitting: Specialist

## 2018-05-21 ENCOUNTER — Ambulatory Visit (INDEPENDENT_AMBULATORY_CARE_PROVIDER_SITE_OTHER): Payer: PPO | Admitting: Specialist

## 2018-05-21 VITALS — BP 125/79 | HR 92 | Ht 68.0 in | Wt 230.0 lb

## 2018-05-21 DIAGNOSIS — M4326 Fusion of spine, lumbar region: Secondary | ICD-10-CM

## 2018-05-21 DIAGNOSIS — Z981 Arthrodesis status: Secondary | ICD-10-CM

## 2018-05-21 DIAGNOSIS — G5603 Carpal tunnel syndrome, bilateral upper limbs: Secondary | ICD-10-CM

## 2018-05-21 NOTE — Progress Notes (Signed)
Office Visit Note   Patient: Jesus Brown           Date of Birth: Mar 24, 1966           MRN: [id number] Visit Date: 05/21/2018              Requested by: Merrilee Seashore, [address] [place], [place] [address] PCP: Merrilee Seashore, MD   Assessment & Plan: Visit Diagnoses:  1. Bilateral carpal tunnel syndrome     Plan: Carpal Tunnel Syndrome  Carpal tunnel syndrome is a condition that causes pain in your hand and arm. The carpal tunnel is a narrow area located on the palm side of your wrist. Repeated wrist motion or certain diseases may cause swelling within the tunnel. This swelling pinches the main nerve in the wrist (median nerve). What are the causes? This condition may be caused by:  Repeated wrist motions.  Wrist injuries.  Arthritis.  A cyst or tumor in the carpal tunnel.  Fluid buildup during pregnancy. Sometimes the cause of this condition is not known. What increases the risk? This condition is more likely to develop in:  People who have jobs that cause them to repeatedly move their wrists in the same motion, such as Art gallery manager.  Women.  People with certain conditions, such as: ? Diabetes. ? Obesity. ? An underactive thyroid (hypothyroidism). ? Kidney failure. What are the signs or symptoms? Symptoms of this condition include:  A tingling feeling in your fingers, especially in your thumb, index, and middle fingers.  Tingling or numbness in your hand.  An aching feeling in your entire arm, especially when your wrist and elbow are bent for long periods of time.  Wrist pain that goes up your arm to your shoulder.  Pain that goes down into your palm or fingers.  A weak feeling in your hands. You may have trouble grabbing and holding items. Your symptoms may feel worse during the night. How is this diagnosed? This condition is diagnosed with a medical history and physical exam. You may also have tests,  including:  An electromyogram (EMG). This test measures electrical signals sent by your nerves into the muscles.  X-rays. How is this treated? Treatment for this condition includes:  Lifestyle changes. It is important to stop doing or modify the activity that caused your condition.  Physical or occupational therapy.  Medicines for pain and inflammation. This may include medicine that is injected into your wrist.  A wrist splint.  Surgery. Follow these instructions at home: If you have a splint:   Wear it as told by your health care provider. Remove it only as told by your health care provider.  Loosen the splint if your fingers become numb and tingle, or if they turn cold and blue.  Keep the splint clean and dry. General instructions   Take over-the-counter and prescription medicines only as told by your health care provider.  Rest your wrist from any activity that may be causing your pain. If your condition is work related, talk to your employer about changes that can be made, such as getting a wrist pad to use while typing.  If directed, apply ice to the painful area: ? Put ice in a plastic bag. ? Place a towel between your skin and the bag. ? Leave the ice on for 20 minutes, 2-3 times per day.  Keep all follow-up visits as told by your health care provider. This is important.  Do any exercises  as told by your health care provider, physical therapist, or occupational therapist. Contact a health care provider if:  You have new symptoms.  Your pain is not controlled with medicines.  Your symptoms get worse. This information is not intended to replace advice given to you by your health care provider. Make sure you discuss any questions you have with your health care provider. Document Released: 03/22/2000 Document Revised: 08/03/2015 Document Reviewed: 12/04/2016 Elsevier Interactive Patient Education  2017 Garden oil and Vitamin B complex for nerve  regeneration Follow-Up Instructions: Return in about 3 weeks (around 06/11/2018).   Orders:  Orders Placed This Encounter  Procedures  . XR Hand Complete Left  . XR Hand Complete Right   No orders of the defined types were placed in this encounter.     Procedures: No procedures performed   Clinical Data: No additional findings.   Subjective: Chief Complaint  Patient presents with  . Left Hand - Numbness    Dr. Greta Doom referred for Carpal tunnel    HPI  Review of Systems   Objective: Vital Signs: BP 125/79 (BP Location: Left Arm, Patient Position: Sitting)   Pulse 92   Ht 5\' 8"  (1.727 m)   Wt 230 lb (104.3 kg)   BMI 34.97 kg/m   Physical Exam  Ortho Exam  Specialty Comments:  No specialty comments available.  Imaging: No results found.   PMFS History: Patient Active Problem List   Diagnosis Date Noted  . Retained orthopedic hardware 12/19/2017    Priority: High    Class: Chronic  . Bilateral primary osteoarthritis of knee 07/19/2016    Priority: High    Class: Chronic  . Cervical spondylosis without myelopathy 03/25/2014    Priority: High    Class: Chronic  . Left cervical radiculopathy 03/25/2014    Priority: High    Class: Chronic  . Closed fracture of right ankle   . Cellulitis of right leg 05/03/2017  . Hypokalemia 05/03/2017  . Hypertension 05/03/2017  . GERD (gastroesophageal reflux disease) 05/03/2017  . Anxiety 05/03/2017  . S/P total knee replacement using cement, right 04/11/2017  . Status post total left knee replacement 09/26/2016  . Presence of left artificial knee joint 07/25/2016  . Pain and swelling of left lower leg 07/25/2016  . Unilateral primary osteoarthritis, right knee 07/19/2016  . 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 Hypotestosteronemia 09/08/2015  . Cervical spondylosis with radiculopathy 03/25/2014   Past Medical History:  Diagnosis Date  . Anxiety    mainly r/t pain  . Arthritis    neck,  knees  . Asthma    AS CHILD   . Back pain, chronic   . DDD (degenerative disc disease)   . Dysphagia   . 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 GERD (gastroesophageal reflux disease)   . Hypertension   . Hypotestosteronemia 09/08/2015  . Neck pain, chronic     Family History  Problem Relation Age of Onset  . Hypertension Mother   . Heart attack Father     Past Surgical History:  Procedure Laterality Date  . ANKLE SURGERY    . BACK SURGERY  2010,2011   lumb fusionx2  . CERVICAL FUSION  3710,6269   3 surgeries  . DIRECT LARYNGOSCOPY N/A 11/09/2012   Procedure: DIRECT LARYNGOSCOPY;  Surgeon: Jodi Marble, MD;  Location: [place];  Service: ENT;  Laterality: N/A;  . ESOPHAGOGASTRODUODENOSCOPY N/A 08/03/2012   Procedure: ESOPHAGOGASTRODUODENOSCOPY (EGD);  Surgeon: Arta Silence,  MD;  Location: [place] ENDOSCOPY;  Service: Endoscopy;  Laterality: N/A;  . ESOPHAGOSCOPY W/ BOTOX INJECTION N/A 11/09/2012   Procedure: ESOPHAGOSCOPY POSSIBLE BIOPSY AND POSSIBLE BOTOX INJECTION CRICOPHARYNGEUS;  Surgeon: Jodi Marble, MD;  Location: [place];  Service: ENT;  Laterality: N/A;  Esophagoscopy with botox injection  . HARDWARE REMOVAL Right 12/19/2017   Procedure: REMOVAL OF SMALL FRAGMENT PLATE AND SCREWS RIGHT LATERAL ANKLE;  Surgeon: Jessy Oto, MD;  Location: [place];  Service: Orthopedics;  Laterality: Right;  . HERNIA REPAIR     ing-rt  . KNEE SURGERY     rt and left x2 each=4  . POSTERIOR CERVICAL FUSION/FORAMINOTOMY N/A 03/25/2014   Procedure: LEFT C7-T1 FORAMINOTOMY;  Surgeon: Jessy Oto, MD;  Location: [place];  Service: Orthopedics;  Laterality: N/A;  . [place]   rt and lt  . TONSILLECTOMY    . TOTAL KNEE ARTHROPLASTY Left 07/19/2016   Procedure: LEFT TOTAL KNEE ARTHROPLASTY CORTISON INJECTION IN RIGHT KNEE;  Surgeon: Jessy Oto, MD;  Location: [place];  Service: Orthopedics;  Laterality: Left;  MAY NEED  RNFA TO FINISH CASE PER [place]   . TOTAL KNEE ARTHROPLASTY Right 04/11/2017   Procedure: RIGHT TOTAL KNEE ARTHROPLASTY;  Surgeon: Jessy Oto, MD;  Location: [place];  Service: Orthopedics;  Laterality: Right;  . WRIST SURGERY     lt and rt   Social History   Occupational History  . Not on file  Tobacco Use  . Smoking status: Never Smoker  . Smokeless tobacco: Never Used  Substance and Sexual Activity  . Alcohol use: No  . Drug use: No  . Sexual activity: Not on file

## 2018-05-21 NOTE — Patient Instructions (Addendum)
Carpal Tunnel Syndrome  Carpal tunnel syndrome is a condition that causes pain in your hand and arm. The carpal tunnel is a narrow area located on the palm side of your wrist. Repeated wrist motion or certain diseases may cause swelling within the tunnel. This swelling pinches the main nerve in the wrist (median nerve). What are the causes? This condition may be caused by:  Repeated wrist motions.  Wrist injuries.  Arthritis.  A cyst or tumor in the carpal tunnel.  Fluid buildup during pregnancy. Sometimes the cause of this condition is not known. What increases the risk? This condition is more likely to develop in:  People who have jobs that cause them to repeatedly move their wrists in the same motion, such as Art gallery manager.  Women.  People with certain conditions, such as: ? Diabetes. ? Obesity. ? An underactive thyroid (hypothyroidism). ? Kidney failure. What are the signs or symptoms? Symptoms of this condition include:  A tingling feeling in your fingers, especially in your thumb, index, and middle fingers.  Tingling or numbness in your hand.  An aching feeling in your entire arm, especially when your wrist and elbow are bent for long periods of time.  Wrist pain that goes up your arm to your shoulder.  Pain that goes down into your palm or fingers.  A weak feeling in your hands. You may have trouble grabbing and holding items. Your symptoms may feel worse during the night. How is this diagnosed? This condition is diagnosed with a medical history and physical exam. You may also have tests, including:  An electromyogram (EMG). This test measures electrical signals sent by your nerves into the muscles.  X-rays. How is this treated? Treatment for this condition includes:  Lifestyle changes. It is important to stop doing or modify the activity that caused your condition.  Physical or occupational therapy.  Medicines for pain and inflammation. This may  include medicine that is injected into your wrist.  A wrist splint.  Surgery. Follow these instructions at home: If you have a splint:   Wear it as told by your health care provider. Remove it only as told by your health care provider.  Loosen the splint if your fingers become numb and tingle, or if they turn cold and blue.  Keep the splint clean and dry. General instructions   Take over-the-counter and prescription medicines only as told by your health care provider.  Rest your wrist from any activity that may be causing your pain. If your condition is work related, talk to your employer about changes that can be made, such as getting a wrist pad to use while typing.  If directed, apply ice to the painful area: ? Put ice in a plastic bag. ? Place a towel between your skin and the bag. ? Leave the ice on for 20 minutes, 2-3 times per day.  Keep all follow-up visits as told by your health care provider. This is important.  Do any exercises as told by your health care provider, physical therapist, or occupational therapist. Contact a health care provider if:  You have new symptoms.  Your pain is not controlled with medicines.  Your symptoms get worse. This information is not intended to replace advice given to you by your health care provider. Make sure you discuss any questions you have with your health care provider. Document Released: 03/22/2000 Document Revised: 08/03/2015 Document Reviewed: 12/04/2016 [place] oil and Vitamin B complex for nerve regeneration. Elsevier Interactive Patient Education  2017 Elsevier Inc.  

## 2018-06-01 DIAGNOSIS — M79641 Pain in right hand: Secondary | ICD-10-CM | POA: Diagnosis not present

## 2018-06-01 DIAGNOSIS — G5603 Carpal tunnel syndrome, bilateral upper limbs: Secondary | ICD-10-CM | POA: Diagnosis not present

## 2018-06-01 DIAGNOSIS — M79642 Pain in left hand: Secondary | ICD-10-CM | POA: Diagnosis not present

## 2018-06-04 ENCOUNTER — Other Ambulatory Visit: Payer: Self-pay | Admitting: Orthopedic Surgery

## 2018-06-04 DIAGNOSIS — M79641 Pain in right hand: Secondary | ICD-10-CM

## 2018-06-04 DIAGNOSIS — M79642 Pain in left hand: Secondary | ICD-10-CM

## 2018-06-04 DIAGNOSIS — G5603 Carpal tunnel syndrome, bilateral upper limbs: Secondary | ICD-10-CM

## 2018-06-09 ENCOUNTER — Ambulatory Visit
Admission: RE | Admit: 2018-06-09 | Discharge: 2018-06-09 | Disposition: A | Discharge status: Home / Self Care | Payer: PPO | Source: Ambulatory Visit | Attending: Orthopedic Surgery | Admitting: Orthopedic Surgery

## 2018-06-09 ENCOUNTER — Other Ambulatory Visit: Payer: PPO

## 2018-06-09 DIAGNOSIS — G5603 Carpal tunnel syndrome, bilateral upper limbs: Secondary | ICD-10-CM

## 2018-06-09 DIAGNOSIS — M79642 Pain in left hand: Secondary | ICD-10-CM

## 2018-06-11 DIAGNOSIS — G894 Chronic pain syndrome: Secondary | ICD-10-CM | POA: Diagnosis not present

## 2018-06-11 DIAGNOSIS — J3089 Other allergic rhinitis: Secondary | ICD-10-CM | POA: Diagnosis not present

## 2018-06-11 DIAGNOSIS — F419 Anxiety disorder, unspecified: Secondary | ICD-10-CM | POA: Diagnosis not present

## 2018-06-11 DIAGNOSIS — M62830 Muscle spasm of back: Secondary | ICD-10-CM | POA: Diagnosis not present

## 2018-06-11 DIAGNOSIS — J301 Allergic rhinitis due to pollen: Secondary | ICD-10-CM | POA: Diagnosis not present

## 2018-06-11 DIAGNOSIS — M47812 Spondylosis without myelopathy or radiculopathy, cervical region: Secondary | ICD-10-CM | POA: Diagnosis not present

## 2018-06-11 DIAGNOSIS — J3081 Allergic rhinitis due to animal (cat) (dog) hair and dander: Secondary | ICD-10-CM | POA: Diagnosis not present

## 2018-06-22 DIAGNOSIS — J301 Allergic rhinitis due to pollen: Secondary | ICD-10-CM | POA: Diagnosis not present

## 2018-06-22 DIAGNOSIS — J3089 Other allergic rhinitis: Secondary | ICD-10-CM | POA: Diagnosis not present

## 2018-06-24 DIAGNOSIS — J3089 Other allergic rhinitis: Secondary | ICD-10-CM | POA: Diagnosis not present

## 2018-06-24 DIAGNOSIS — J301 Allergic rhinitis due to pollen: Secondary | ICD-10-CM | POA: Diagnosis not present

## 2018-06-24 DIAGNOSIS — J3081 Allergic rhinitis due to animal (cat) (dog) hair and dander: Secondary | ICD-10-CM | POA: Diagnosis not present

## 2018-07-09 DIAGNOSIS — F419 Anxiety disorder, unspecified: Secondary | ICD-10-CM | POA: Diagnosis not present

## 2018-07-09 DIAGNOSIS — Z780 Asymptomatic menopausal state: Secondary | ICD-10-CM | POA: Diagnosis not present

## 2018-07-09 DIAGNOSIS — R6889 Other general symptoms and signs: Secondary | ICD-10-CM | POA: Diagnosis not present

## 2018-07-09 DIAGNOSIS — N183 Chronic kidney disease, stage 3 unspecified: Secondary | ICD-10-CM | POA: Diagnosis not present

## 2018-07-09 DIAGNOSIS — N186 End stage renal disease: Secondary | ICD-10-CM | POA: Diagnosis not present

## 2018-07-09 DIAGNOSIS — Z89612 Acquired absence of left leg above knee: Secondary | ICD-10-CM | POA: Diagnosis not present

## 2018-07-09 DIAGNOSIS — N141 Nephropathy induced by other drugs, medicaments and biological substances: Secondary | ICD-10-CM | POA: Diagnosis not present

## 2018-07-09 DIAGNOSIS — M47812 Spondylosis without myelopathy or radiculopathy, cervical region: Secondary | ICD-10-CM | POA: Diagnosis not present

## 2018-07-09 DIAGNOSIS — Z1231 Encounter for screening mammogram for malignant neoplasm of breast: Secondary | ICD-10-CM | POA: Diagnosis not present

## 2018-07-09 DIAGNOSIS — M62830 Muscle spasm of back: Secondary | ICD-10-CM | POA: Diagnosis not present

## 2018-07-09 DIAGNOSIS — G894 Chronic pain syndrome: Secondary | ICD-10-CM | POA: Diagnosis not present

## 2018-07-09 DIAGNOSIS — M810 Age-related osteoporosis without current pathological fracture: Secondary | ICD-10-CM | POA: Diagnosis not present

## 2018-07-09 DIAGNOSIS — Z472 Encounter for removal of internal fixation device: Secondary | ICD-10-CM | POA: Diagnosis not present

## 2018-07-09 DIAGNOSIS — J3089 Other allergic rhinitis: Secondary | ICD-10-CM | POA: Diagnosis not present

## 2018-07-09 DIAGNOSIS — T8130XA Disruption of wound, unspecified, initial encounter: Secondary | ICD-10-CM | POA: Diagnosis not present

## 2018-07-09 DIAGNOSIS — H25812 Combined forms of age-related cataract, left eye: Secondary | ICD-10-CM | POA: Diagnosis not present

## 2018-07-09 DIAGNOSIS — M868X5 Other osteomyelitis, thigh: Secondary | ICD-10-CM | POA: Diagnosis not present

## 2018-07-09 DIAGNOSIS — J301 Allergic rhinitis due to pollen: Secondary | ICD-10-CM | POA: Diagnosis not present

## 2018-07-09 DIAGNOSIS — Z992 Dependence on renal dialysis: Secondary | ICD-10-CM | POA: Diagnosis not present

## 2018-07-09 DIAGNOSIS — J3081 Allergic rhinitis due to animal (cat) (dog) hair and dander: Secondary | ICD-10-CM | POA: Diagnosis not present

## 2018-07-09 DIAGNOSIS — T8452XD Infection and inflammatory reaction due to internal left hip prosthesis, subsequent encounter: Secondary | ICD-10-CM | POA: Diagnosis not present

## 2018-07-09 DIAGNOSIS — J309 Allergic rhinitis, unspecified: Secondary | ICD-10-CM | POA: Diagnosis not present

## 2018-07-22 DIAGNOSIS — J3081 Allergic rhinitis due to animal (cat) (dog) hair and dander: Secondary | ICD-10-CM | POA: Diagnosis not present

## 2018-07-22 DIAGNOSIS — J3089 Other allergic rhinitis: Secondary | ICD-10-CM | POA: Diagnosis not present

## 2018-07-22 DIAGNOSIS — J301 Allergic rhinitis due to pollen: Secondary | ICD-10-CM | POA: Diagnosis not present

## 2018-08-06 DIAGNOSIS — F419 Anxiety disorder, unspecified: Secondary | ICD-10-CM | POA: Diagnosis not present

## 2018-08-06 DIAGNOSIS — G894 Chronic pain syndrome: Secondary | ICD-10-CM | POA: Diagnosis not present

## 2018-08-06 DIAGNOSIS — M47812 Spondylosis without myelopathy or radiculopathy, cervical region: Secondary | ICD-10-CM | POA: Diagnosis not present

## 2018-08-06 DIAGNOSIS — M62830 Muscle spasm of back: Secondary | ICD-10-CM | POA: Diagnosis not present

## 2018-08-12 DIAGNOSIS — J3081 Allergic rhinitis due to animal (cat) (dog) hair and dander: Secondary | ICD-10-CM | POA: Diagnosis not present

## 2018-08-12 DIAGNOSIS — J301 Allergic rhinitis due to pollen: Secondary | ICD-10-CM | POA: Diagnosis not present

## 2018-08-12 DIAGNOSIS — J3089 Other allergic rhinitis: Secondary | ICD-10-CM | POA: Diagnosis not present

## 2018-09-01 DIAGNOSIS — J301 Allergic rhinitis due to pollen: Secondary | ICD-10-CM | POA: Diagnosis not present

## 2018-09-01 DIAGNOSIS — F419 Anxiety disorder, unspecified: Secondary | ICD-10-CM | POA: Diagnosis not present

## 2018-09-01 DIAGNOSIS — J3089 Other allergic rhinitis: Secondary | ICD-10-CM | POA: Diagnosis not present

## 2018-09-01 DIAGNOSIS — J3081 Allergic rhinitis due to animal (cat) (dog) hair and dander: Secondary | ICD-10-CM | POA: Diagnosis not present

## 2018-09-01 DIAGNOSIS — M62830 Muscle spasm of back: Secondary | ICD-10-CM | POA: Diagnosis not present

## 2018-09-01 DIAGNOSIS — G894 Chronic pain syndrome: Secondary | ICD-10-CM | POA: Diagnosis not present

## 2018-09-01 DIAGNOSIS — M47812 Spondylosis without myelopathy or radiculopathy, cervical region: Secondary | ICD-10-CM | POA: Diagnosis not present

## 2018-09-03 DIAGNOSIS — E291 Testicular hypofunction: Secondary | ICD-10-CM | POA: Diagnosis not present

## 2018-09-10 DIAGNOSIS — I10 Essential (primary) hypertension: Secondary | ICD-10-CM | POA: Diagnosis not present

## 2018-09-10 DIAGNOSIS — R7301 Impaired fasting glucose: Secondary | ICD-10-CM | POA: Diagnosis not present

## 2018-09-10 DIAGNOSIS — Z7189 Other specified counseling: Secondary | ICD-10-CM | POA: Diagnosis not present

## 2018-09-10 DIAGNOSIS — E669 Obesity, unspecified: Secondary | ICD-10-CM | POA: Diagnosis not present

## 2018-09-10 DIAGNOSIS — E291 Testicular hypofunction: Secondary | ICD-10-CM | POA: Diagnosis not present

## 2018-09-10 DIAGNOSIS — J3089 Other allergic rhinitis: Secondary | ICD-10-CM | POA: Diagnosis not present

## 2018-09-10 DIAGNOSIS — J301 Allergic rhinitis due to pollen: Secondary | ICD-10-CM | POA: Diagnosis not present

## 2018-09-10 DIAGNOSIS — J3081 Allergic rhinitis due to animal (cat) (dog) hair and dander: Secondary | ICD-10-CM | POA: Diagnosis not present

## 2018-09-10 DIAGNOSIS — Z0000 Encounter for general adult medical examination without abnormal findings: Secondary | ICD-10-CM | POA: Diagnosis not present

## 2018-09-25 DIAGNOSIS — J3089 Other allergic rhinitis: Secondary | ICD-10-CM | POA: Diagnosis not present

## 2018-09-25 DIAGNOSIS — J301 Allergic rhinitis due to pollen: Secondary | ICD-10-CM | POA: Diagnosis not present

## 2018-09-25 DIAGNOSIS — J3081 Allergic rhinitis due to animal (cat) (dog) hair and dander: Secondary | ICD-10-CM | POA: Diagnosis not present

## 2018-09-28 DIAGNOSIS — J3089 Other allergic rhinitis: Secondary | ICD-10-CM | POA: Diagnosis not present

## 2018-09-28 DIAGNOSIS — J301 Allergic rhinitis due to pollen: Secondary | ICD-10-CM | POA: Diagnosis not present

## 2018-09-28 DIAGNOSIS — J3081 Allergic rhinitis due to animal (cat) (dog) hair and dander: Secondary | ICD-10-CM | POA: Diagnosis not present

## 2018-09-29 DIAGNOSIS — G894 Chronic pain syndrome: Secondary | ICD-10-CM | POA: Diagnosis not present

## 2018-09-29 DIAGNOSIS — F419 Anxiety disorder, unspecified: Secondary | ICD-10-CM | POA: Diagnosis not present

## 2018-09-29 DIAGNOSIS — M47812 Spondylosis without myelopathy or radiculopathy, cervical region: Secondary | ICD-10-CM | POA: Diagnosis not present

## 2018-09-29 DIAGNOSIS — Z79891 Long term (current) use of opiate analgesic: Secondary | ICD-10-CM | POA: Diagnosis not present

## 2018-09-29 DIAGNOSIS — M62830 Muscle spasm of back: Secondary | ICD-10-CM | POA: Diagnosis not present

## 2018-10-05 DIAGNOSIS — J301 Allergic rhinitis due to pollen: Secondary | ICD-10-CM | POA: Diagnosis not present

## 2018-10-05 DIAGNOSIS — J3081 Allergic rhinitis due to animal (cat) (dog) hair and dander: Secondary | ICD-10-CM | POA: Diagnosis not present

## 2018-10-05 DIAGNOSIS — J3089 Other allergic rhinitis: Secondary | ICD-10-CM | POA: Diagnosis not present

## 2018-10-13 DIAGNOSIS — E669 Obesity, unspecified: Secondary | ICD-10-CM | POA: Diagnosis not present

## 2018-10-13 DIAGNOSIS — Z7189 Other specified counseling: Secondary | ICD-10-CM | POA: Diagnosis not present

## 2018-10-13 DIAGNOSIS — R7301 Impaired fasting glucose: Secondary | ICD-10-CM | POA: Diagnosis not present

## 2018-10-28 DIAGNOSIS — G894 Chronic pain syndrome: Secondary | ICD-10-CM | POA: Diagnosis not present

## 2018-10-28 DIAGNOSIS — F419 Anxiety disorder, unspecified: Secondary | ICD-10-CM | POA: Diagnosis not present

## 2018-10-28 DIAGNOSIS — M47812 Spondylosis without myelopathy or radiculopathy, cervical region: Secondary | ICD-10-CM | POA: Diagnosis not present

## 2018-10-28 DIAGNOSIS — M62830 Muscle spasm of back: Secondary | ICD-10-CM | POA: Diagnosis not present

## 2018-10-29 DIAGNOSIS — J3081 Allergic rhinitis due to animal (cat) (dog) hair and dander: Secondary | ICD-10-CM | POA: Diagnosis not present

## 2018-10-29 DIAGNOSIS — M7711 Lateral epicondylitis, right elbow: Secondary | ICD-10-CM | POA: Diagnosis not present

## 2018-10-29 DIAGNOSIS — J301 Allergic rhinitis due to pollen: Secondary | ICD-10-CM | POA: Diagnosis not present

## 2018-10-29 DIAGNOSIS — J3089 Other allergic rhinitis: Secondary | ICD-10-CM | POA: Diagnosis not present

## 2018-11-20 DIAGNOSIS — J301 Allergic rhinitis due to pollen: Secondary | ICD-10-CM | POA: Diagnosis not present

## 2018-11-20 DIAGNOSIS — J3089 Other allergic rhinitis: Secondary | ICD-10-CM | POA: Diagnosis not present

## 2018-11-20 DIAGNOSIS — J3081 Allergic rhinitis due to animal (cat) (dog) hair and dander: Secondary | ICD-10-CM | POA: Diagnosis not present

## 2018-11-25 DIAGNOSIS — M62830 Muscle spasm of back: Secondary | ICD-10-CM | POA: Diagnosis not present

## 2018-11-25 DIAGNOSIS — M47812 Spondylosis without myelopathy or radiculopathy, cervical region: Secondary | ICD-10-CM | POA: Diagnosis not present

## 2018-11-25 DIAGNOSIS — G894 Chronic pain syndrome: Secondary | ICD-10-CM | POA: Diagnosis not present

## 2018-11-25 DIAGNOSIS — F419 Anxiety disorder, unspecified: Secondary | ICD-10-CM | POA: Diagnosis not present

## 2018-11-27 DIAGNOSIS — J3089 Other allergic rhinitis: Secondary | ICD-10-CM | POA: Diagnosis not present

## 2018-11-27 DIAGNOSIS — J301 Allergic rhinitis due to pollen: Secondary | ICD-10-CM | POA: Diagnosis not present

## 2018-11-27 DIAGNOSIS — J3081 Allergic rhinitis due to animal (cat) (dog) hair and dander: Secondary | ICD-10-CM | POA: Diagnosis not present

## 2018-12-03 DIAGNOSIS — J3081 Allergic rhinitis due to animal (cat) (dog) hair and dander: Secondary | ICD-10-CM | POA: Diagnosis not present

## 2018-12-03 DIAGNOSIS — J301 Allergic rhinitis due to pollen: Secondary | ICD-10-CM | POA: Diagnosis not present

## 2018-12-03 DIAGNOSIS — J3089 Other allergic rhinitis: Secondary | ICD-10-CM | POA: Diagnosis not present

## 2018-12-23 DIAGNOSIS — M47812 Spondylosis without myelopathy or radiculopathy, cervical region: Secondary | ICD-10-CM | POA: Diagnosis not present

## 2018-12-23 DIAGNOSIS — F419 Anxiety disorder, unspecified: Secondary | ICD-10-CM | POA: Diagnosis not present

## 2018-12-23 DIAGNOSIS — J3089 Other allergic rhinitis: Secondary | ICD-10-CM | POA: Diagnosis not present

## 2018-12-23 DIAGNOSIS — G894 Chronic pain syndrome: Secondary | ICD-10-CM | POA: Diagnosis not present

## 2018-12-23 DIAGNOSIS — J3081 Allergic rhinitis due to animal (cat) (dog) hair and dander: Secondary | ICD-10-CM | POA: Diagnosis not present

## 2018-12-23 DIAGNOSIS — M62830 Muscle spasm of back: Secondary | ICD-10-CM | POA: Diagnosis not present

## 2018-12-23 DIAGNOSIS — J301 Allergic rhinitis due to pollen: Secondary | ICD-10-CM | POA: Diagnosis not present

## 2018-12-29 DIAGNOSIS — R7301 Impaired fasting glucose: Secondary | ICD-10-CM | POA: Diagnosis not present

## 2018-12-29 DIAGNOSIS — E669 Obesity, unspecified: Secondary | ICD-10-CM | POA: Diagnosis not present

## 2019-01-05 DIAGNOSIS — K219 Gastro-esophageal reflux disease without esophagitis: Secondary | ICD-10-CM | POA: Diagnosis not present

## 2019-01-05 DIAGNOSIS — I10 Essential (primary) hypertension: Secondary | ICD-10-CM | POA: Diagnosis not present

## 2019-01-05 DIAGNOSIS — Z7189 Other specified counseling: Secondary | ICD-10-CM | POA: Diagnosis not present

## 2019-01-05 DIAGNOSIS — R7301 Impaired fasting glucose: Secondary | ICD-10-CM | POA: Diagnosis not present

## 2019-01-05 DIAGNOSIS — E669 Obesity, unspecified: Secondary | ICD-10-CM | POA: Diagnosis not present

## 2019-01-05 DIAGNOSIS — Z23 Encounter for immunization: Secondary | ICD-10-CM | POA: Diagnosis not present

## 2019-01-21 DIAGNOSIS — M47812 Spondylosis without myelopathy or radiculopathy, cervical region: Secondary | ICD-10-CM | POA: Diagnosis not present

## 2019-01-21 DIAGNOSIS — G894 Chronic pain syndrome: Secondary | ICD-10-CM | POA: Diagnosis not present

## 2019-01-21 DIAGNOSIS — F419 Anxiety disorder, unspecified: Secondary | ICD-10-CM | POA: Diagnosis not present

## 2019-01-21 DIAGNOSIS — M62830 Muscle spasm of back: Secondary | ICD-10-CM | POA: Diagnosis not present

## 2019-01-21 DIAGNOSIS — M7711 Lateral epicondylitis, right elbow: Secondary | ICD-10-CM | POA: Diagnosis not present

## 2019-02-05 DIAGNOSIS — J3089 Other allergic rhinitis: Secondary | ICD-10-CM | POA: Diagnosis not present

## 2019-02-05 DIAGNOSIS — J3081 Allergic rhinitis due to animal (cat) (dog) hair and dander: Secondary | ICD-10-CM | POA: Diagnosis not present

## 2019-02-05 DIAGNOSIS — J301 Allergic rhinitis due to pollen: Secondary | ICD-10-CM | POA: Diagnosis not present

## 2019-02-18 DIAGNOSIS — F419 Anxiety disorder, unspecified: Secondary | ICD-10-CM | POA: Diagnosis not present

## 2019-02-18 DIAGNOSIS — M47812 Spondylosis without myelopathy or radiculopathy, cervical region: Secondary | ICD-10-CM | POA: Diagnosis not present

## 2019-02-18 DIAGNOSIS — M62830 Muscle spasm of back: Secondary | ICD-10-CM | POA: Diagnosis not present

## 2019-02-18 DIAGNOSIS — G894 Chronic pain syndrome: Secondary | ICD-10-CM | POA: Diagnosis not present

## 2019-03-01 DIAGNOSIS — J301 Allergic rhinitis due to pollen: Secondary | ICD-10-CM | POA: Diagnosis not present

## 2019-03-01 DIAGNOSIS — J3081 Allergic rhinitis due to animal (cat) (dog) hair and dander: Secondary | ICD-10-CM | POA: Diagnosis not present

## 2019-03-01 DIAGNOSIS — J3089 Other allergic rhinitis: Secondary | ICD-10-CM | POA: Diagnosis not present

## 2019-03-08 DIAGNOSIS — J3081 Allergic rhinitis due to animal (cat) (dog) hair and dander: Secondary | ICD-10-CM | POA: Diagnosis not present

## 2019-03-08 DIAGNOSIS — J301 Allergic rhinitis due to pollen: Secondary | ICD-10-CM | POA: Diagnosis not present

## 2019-03-08 DIAGNOSIS — J3089 Other allergic rhinitis: Secondary | ICD-10-CM | POA: Diagnosis not present

## 2019-03-18 DIAGNOSIS — F419 Anxiety disorder, unspecified: Secondary | ICD-10-CM | POA: Diagnosis not present

## 2019-03-18 DIAGNOSIS — M62830 Muscle spasm of back: Secondary | ICD-10-CM | POA: Diagnosis not present

## 2019-03-18 DIAGNOSIS — G894 Chronic pain syndrome: Secondary | ICD-10-CM | POA: Diagnosis not present

## 2019-03-18 DIAGNOSIS — M47812 Spondylosis without myelopathy or radiculopathy, cervical region: Secondary | ICD-10-CM | POA: Diagnosis not present

## 2019-03-29 DIAGNOSIS — J3089 Other allergic rhinitis: Secondary | ICD-10-CM | POA: Diagnosis not present

## 2019-03-29 DIAGNOSIS — J3081 Allergic rhinitis due to animal (cat) (dog) hair and dander: Secondary | ICD-10-CM | POA: Diagnosis not present

## 2019-03-29 DIAGNOSIS — J301 Allergic rhinitis due to pollen: Secondary | ICD-10-CM | POA: Diagnosis not present

## 2019-04-15 DIAGNOSIS — F419 Anxiety disorder, unspecified: Secondary | ICD-10-CM | POA: Diagnosis not present

## 2019-04-15 DIAGNOSIS — G894 Chronic pain syndrome: Secondary | ICD-10-CM | POA: Diagnosis not present

## 2019-04-15 DIAGNOSIS — M47812 Spondylosis without myelopathy or radiculopathy, cervical region: Secondary | ICD-10-CM | POA: Diagnosis not present

## 2019-04-15 DIAGNOSIS — M62830 Muscle spasm of back: Secondary | ICD-10-CM | POA: Diagnosis not present

## 2019-04-27 DIAGNOSIS — J301 Allergic rhinitis due to pollen: Secondary | ICD-10-CM | POA: Diagnosis not present

## 2019-04-27 DIAGNOSIS — J3089 Other allergic rhinitis: Secondary | ICD-10-CM | POA: Diagnosis not present

## 2019-04-27 DIAGNOSIS — J3081 Allergic rhinitis due to animal (cat) (dog) hair and dander: Secondary | ICD-10-CM | POA: Diagnosis not present

## 2019-05-03 IMAGING — DX DG TIBIA/FIBULA 2V*R*
4 series · 4 of 4 positions shown · non-contrast
Comparison: 07/28/2011

CLINICAL DATA: Cellulitis RIGHT lower leg, fever, chills, had RIGHT
knee replacement 3 weeks ago

EXAM:
RIGHT TIBIA AND FIBULA - 2 VIEW

[tibia ap (1 of 2)]
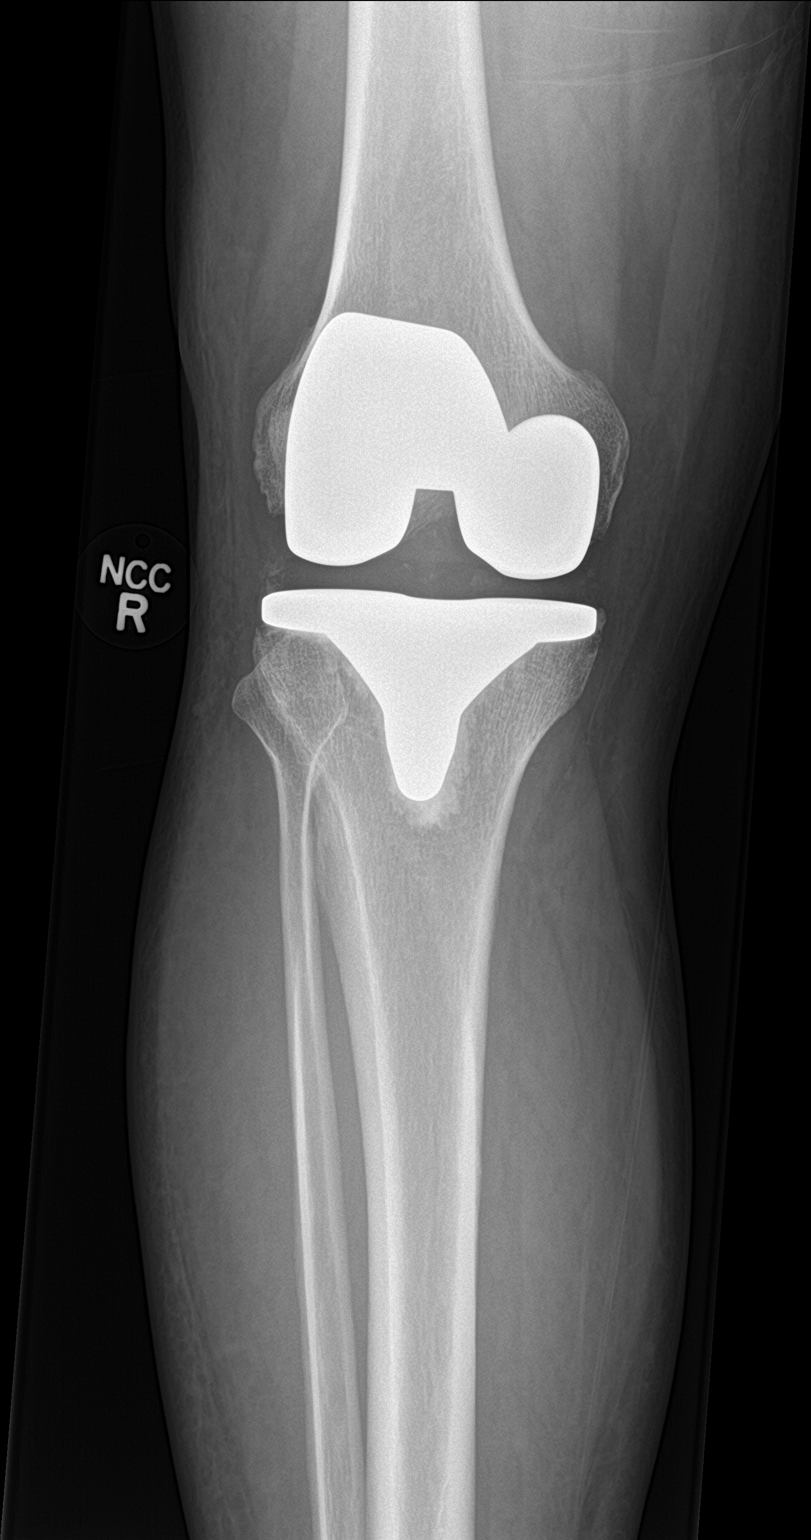

[tibia ap (2 of 2)]
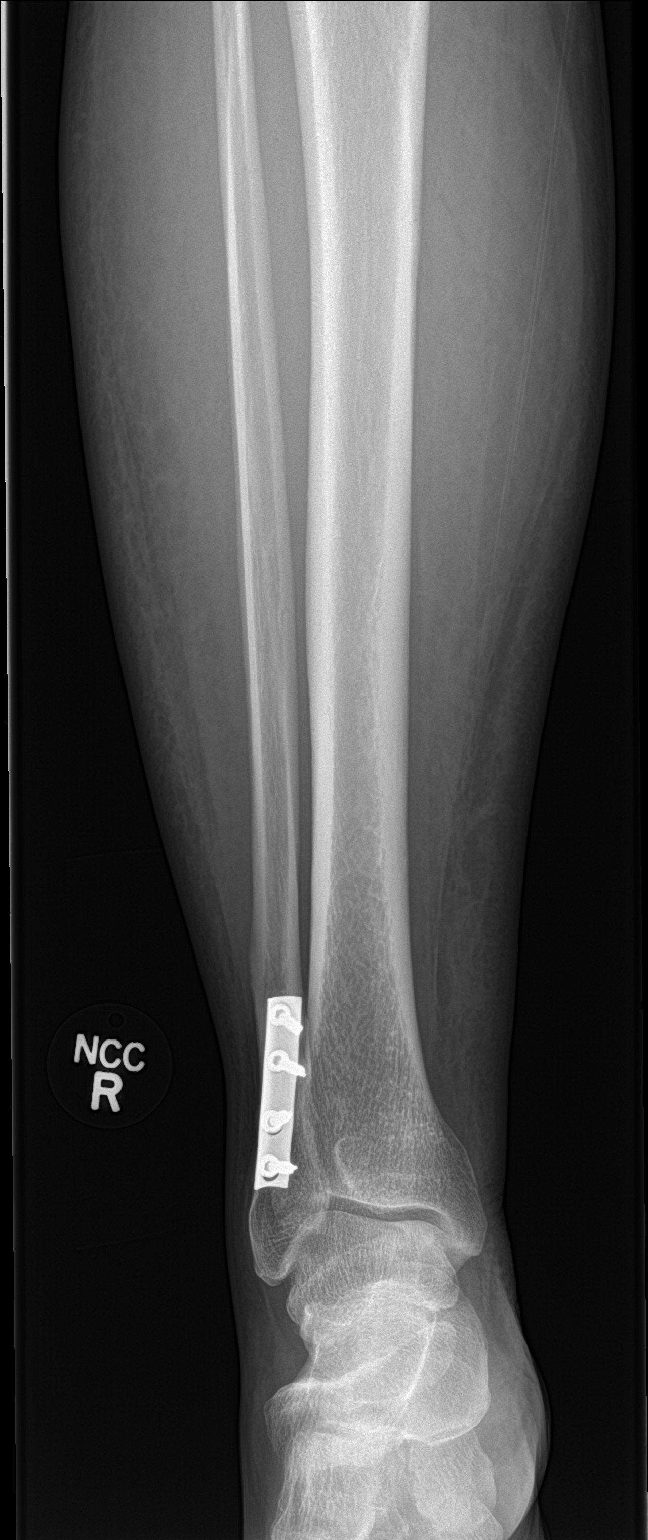

[tibia lat (1 of 2)]
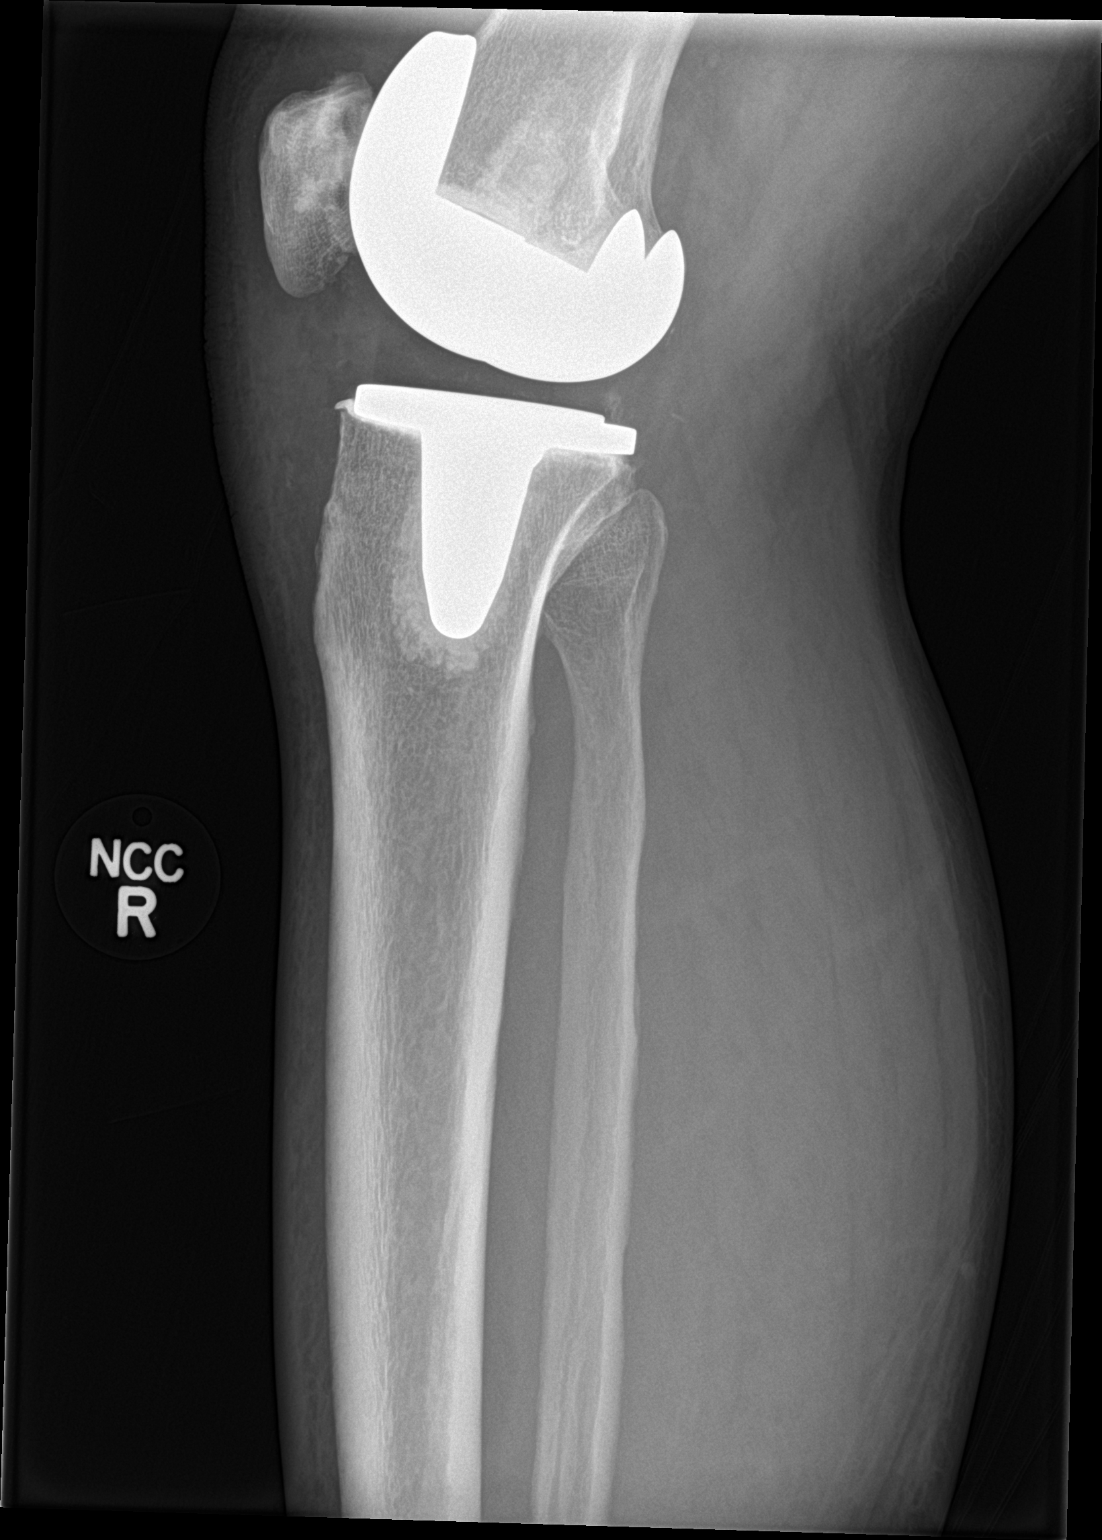

[tibia lat (2 of 2)]
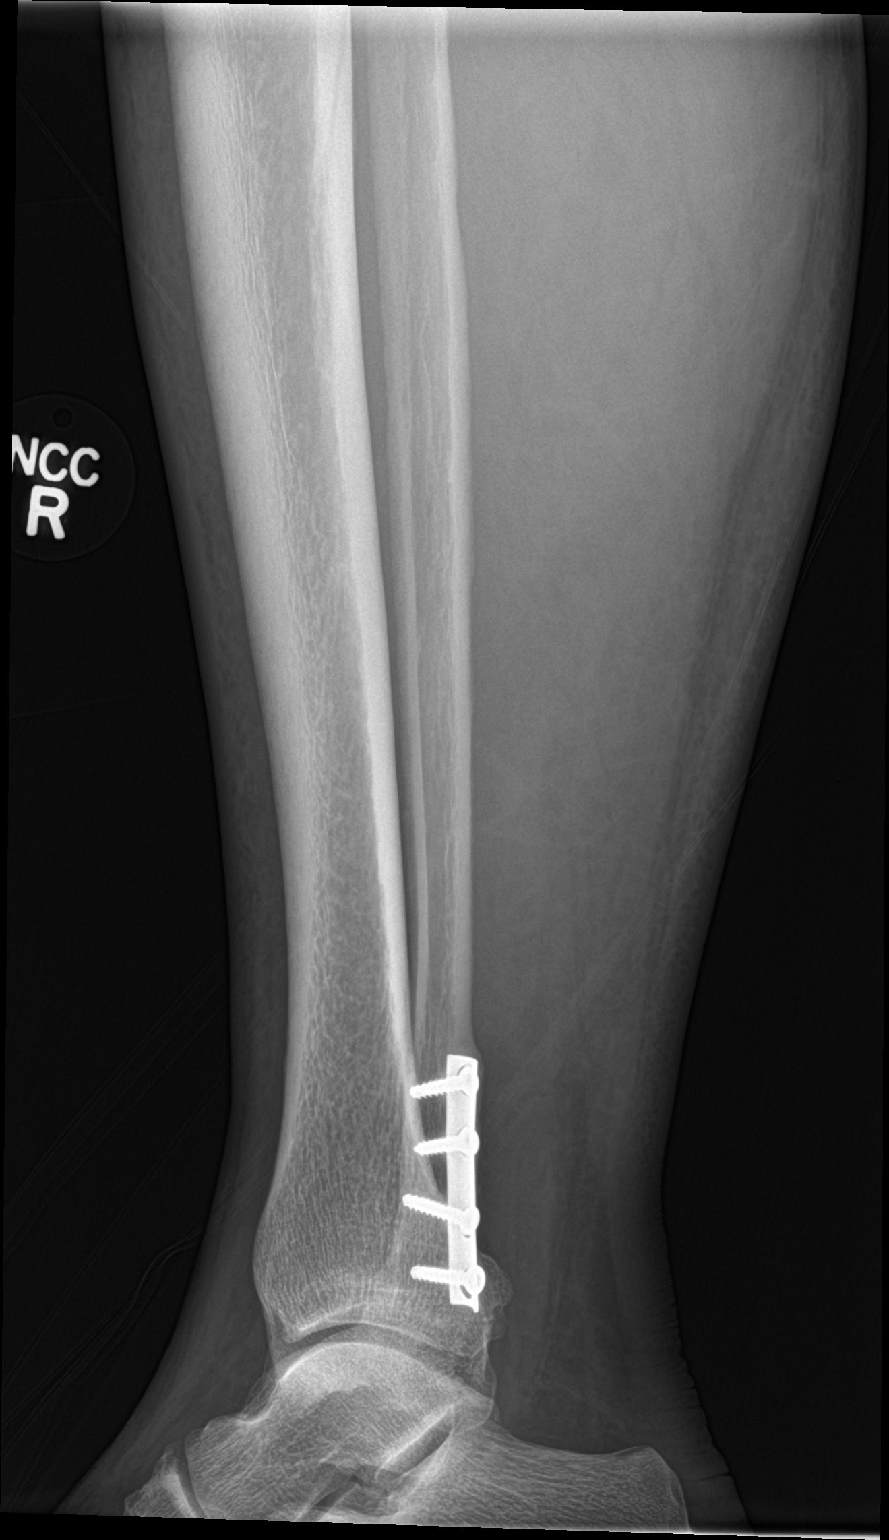

[4 of 4 positions shown; findings below may reference images not displayed]

FINDINGS: New components of a RIGHT knee prosthesis since previous exam.

Osseous mineralization normal.

Knee and ankle joint alignments normal.

Malleable plate and 4 screws at distal RIGHT fibula post ORIF.

No acute fracture, dislocation, or bone destruction.

No lucency seen surrounding orthopedic hardware.

Scattered soft tissue edema RIGHT lower leg.
IMPRESSION: Prior distal RIGHT fibular ORIF and RIGHT total knee arthroplasty.

No acute bony abnormalities.

## 2019-05-03 IMAGING — DX DG KNEE COMPLETE 4+V*R*
4 series · 4 of 4 positions shown · non-contrast
Comparison: 07/28/2011 RIGHT tibial and fibular radiographs

CLINICAL DATA: Cellulitis RIGHT lower leg, fever, chills, had RIGHT
knee replacement 3 weeks ago

EXAM:
RIGHT KNEE - COMPLETE 4+ VIEW

[knee ap]
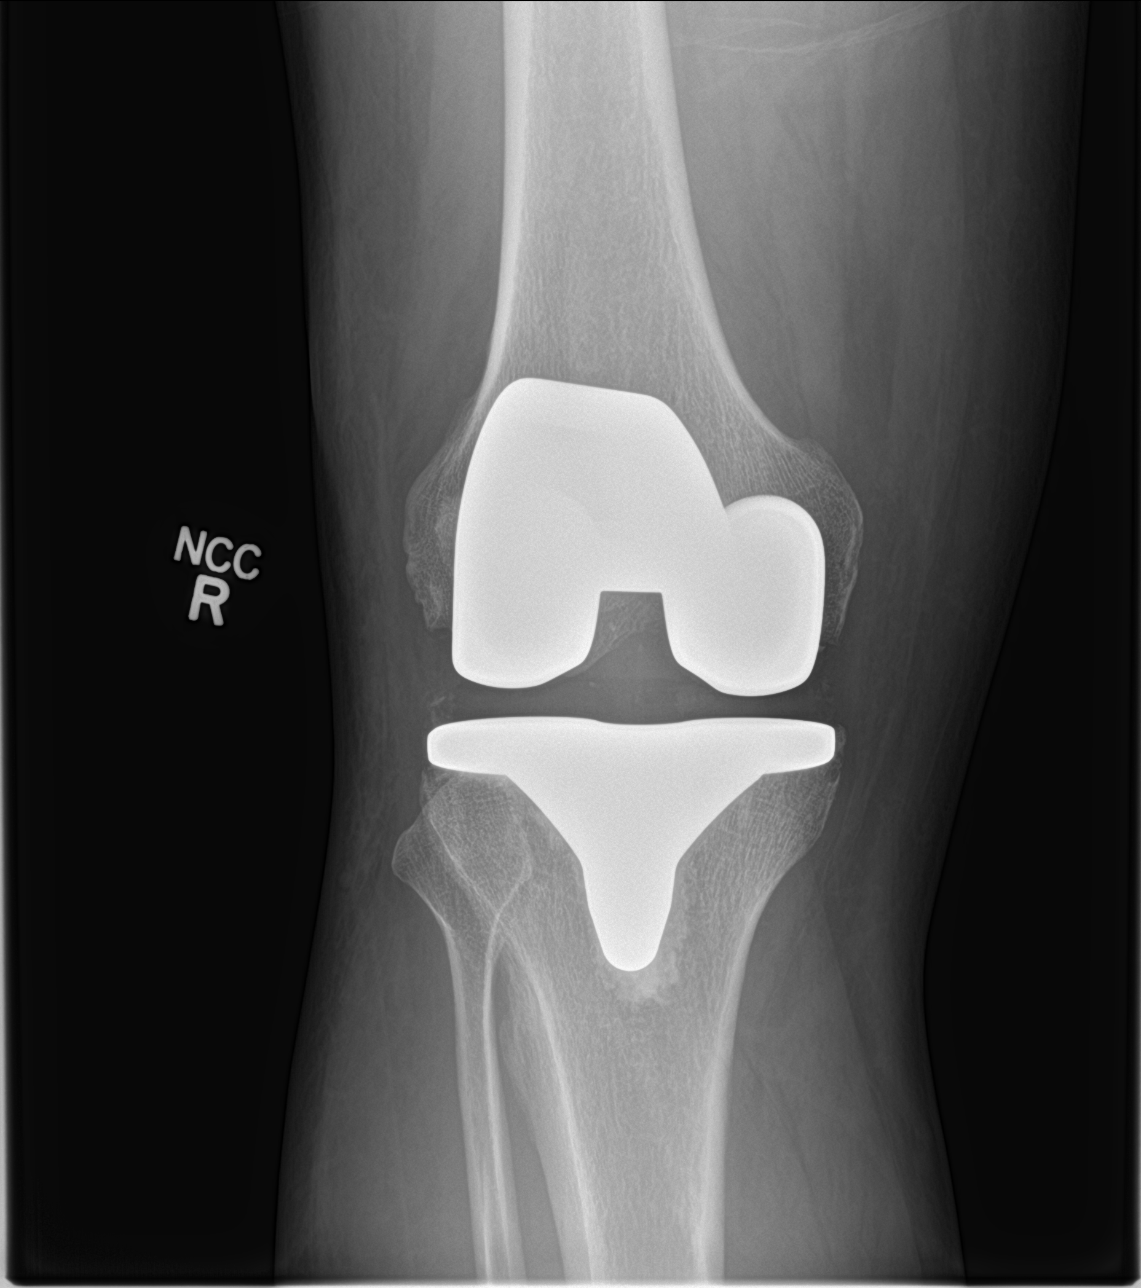

[knee lat]
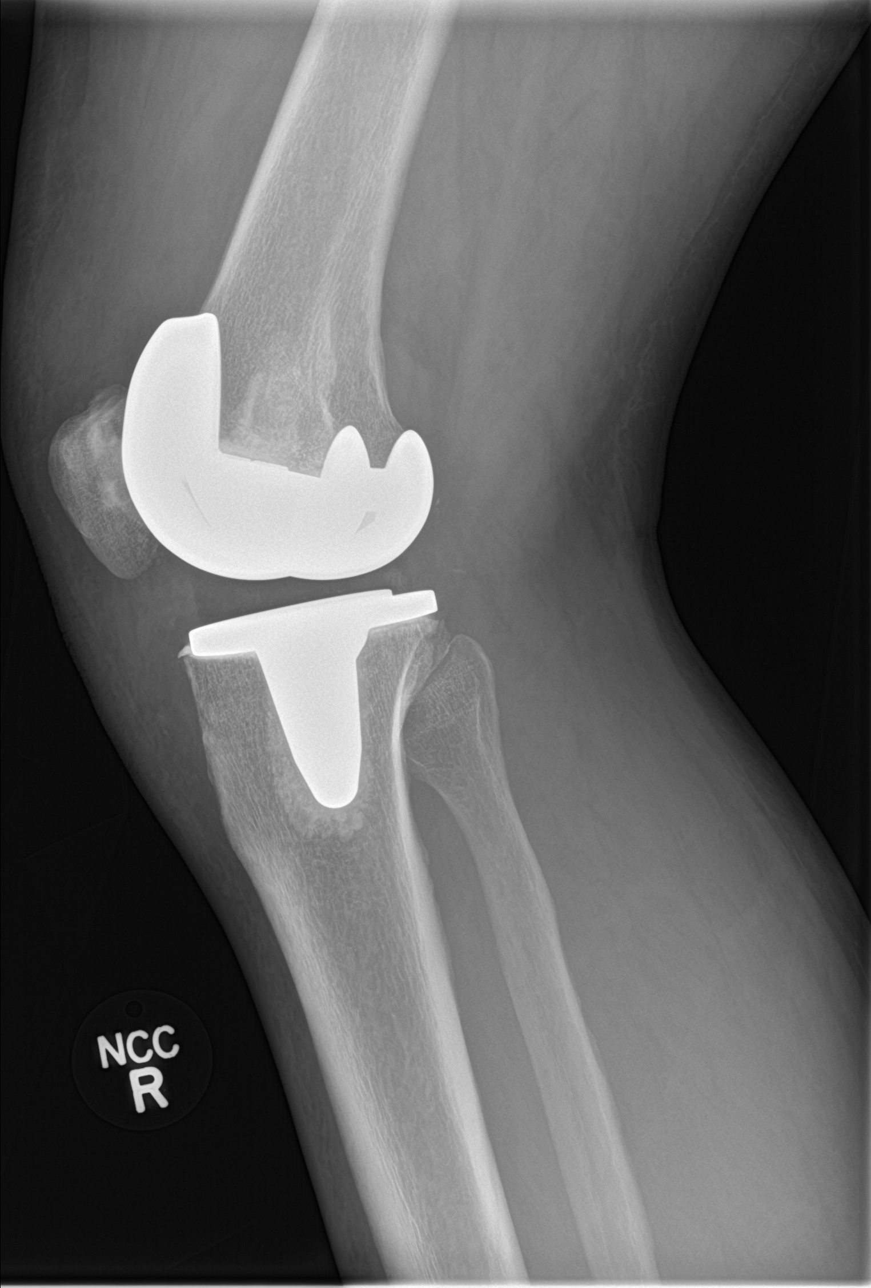

[knee obl (1 of 2)]
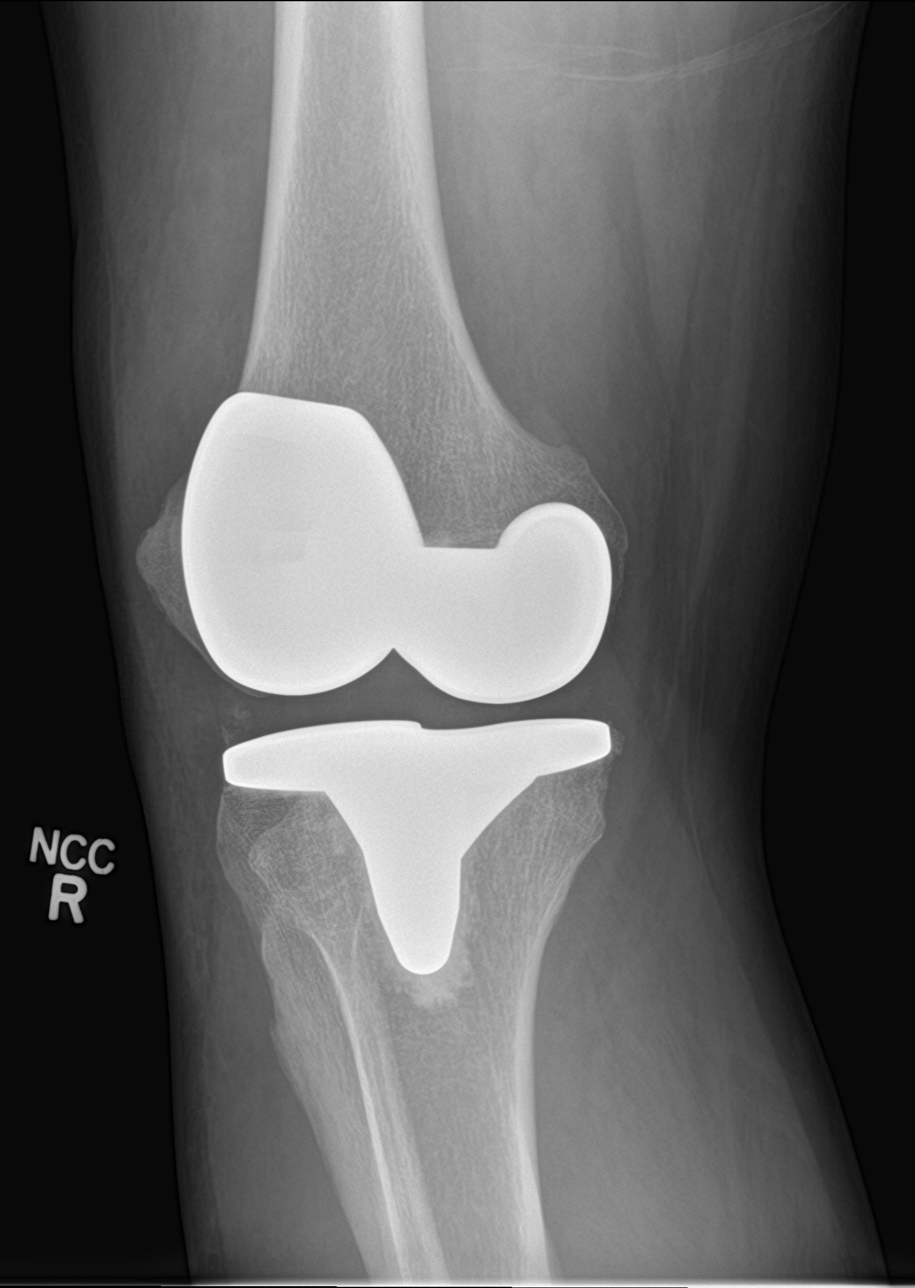

[knee obl (2 of 2)]
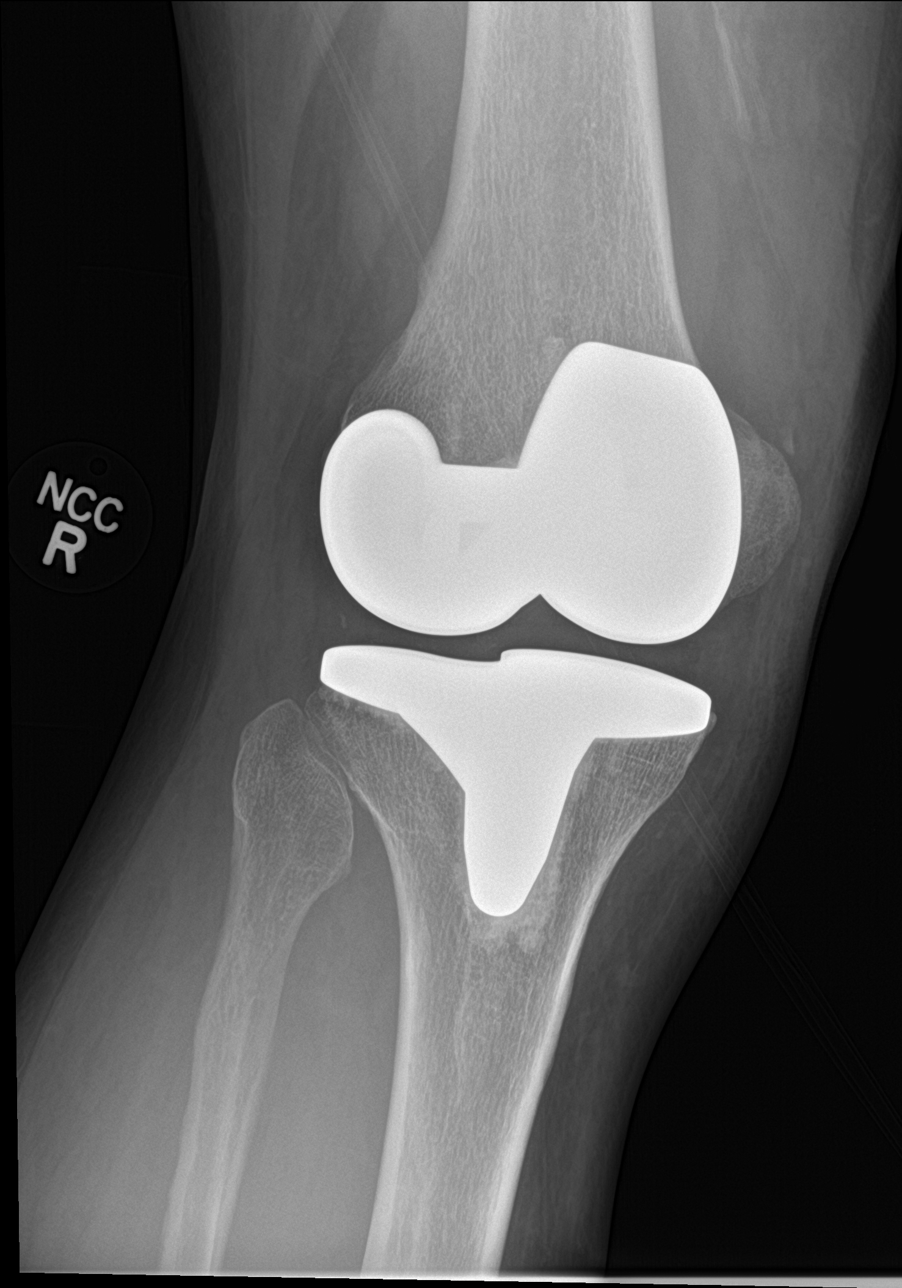

[4 of 4 positions shown; findings below may reference images not displayed]

FINDINGS: Osseous mineralization normal.

New components of RIGHT knee prosthesis in expected positions.

No acute fracture, dislocation, or bone destruction.

No knee joint effusion or periprosthetic lucency.
IMPRESSION: RIGHT knee prosthesis without acute complication.

## 2019-05-13 DIAGNOSIS — F419 Anxiety disorder, unspecified: Secondary | ICD-10-CM | POA: Diagnosis not present

## 2019-05-13 DIAGNOSIS — M62830 Muscle spasm of back: Secondary | ICD-10-CM | POA: Diagnosis not present

## 2019-05-13 DIAGNOSIS — G894 Chronic pain syndrome: Secondary | ICD-10-CM | POA: Diagnosis not present

## 2019-05-13 DIAGNOSIS — M47812 Spondylosis without myelopathy or radiculopathy, cervical region: Secondary | ICD-10-CM | POA: Diagnosis not present

## 2019-05-18 DIAGNOSIS — J3081 Allergic rhinitis due to animal (cat) (dog) hair and dander: Secondary | ICD-10-CM | POA: Diagnosis not present

## 2019-05-18 DIAGNOSIS — R7301 Impaired fasting glucose: Secondary | ICD-10-CM | POA: Diagnosis not present

## 2019-05-18 DIAGNOSIS — J3089 Other allergic rhinitis: Secondary | ICD-10-CM | POA: Diagnosis not present

## 2019-05-18 DIAGNOSIS — K219 Gastro-esophageal reflux disease without esophagitis: Secondary | ICD-10-CM | POA: Diagnosis not present

## 2019-05-18 DIAGNOSIS — J301 Allergic rhinitis due to pollen: Secondary | ICD-10-CM | POA: Diagnosis not present

## 2019-05-18 DIAGNOSIS — I10 Essential (primary) hypertension: Secondary | ICD-10-CM | POA: Diagnosis not present

## 2019-05-25 DIAGNOSIS — J301 Allergic rhinitis due to pollen: Secondary | ICD-10-CM | POA: Diagnosis not present

## 2019-05-25 DIAGNOSIS — J3089 Other allergic rhinitis: Secondary | ICD-10-CM | POA: Diagnosis not present

## 2019-05-25 DIAGNOSIS — I10 Essential (primary) hypertension: Secondary | ICD-10-CM | POA: Diagnosis not present

## 2019-05-25 DIAGNOSIS — J3081 Allergic rhinitis due to animal (cat) (dog) hair and dander: Secondary | ICD-10-CM | POA: Diagnosis not present

## 2019-05-25 DIAGNOSIS — R7301 Impaired fasting glucose: Secondary | ICD-10-CM | POA: Diagnosis not present

## 2019-05-25 DIAGNOSIS — E669 Obesity, unspecified: Secondary | ICD-10-CM | POA: Diagnosis not present

## 2019-06-10 DIAGNOSIS — M62830 Muscle spasm of back: Secondary | ICD-10-CM | POA: Diagnosis not present

## 2019-06-10 DIAGNOSIS — M47812 Spondylosis without myelopathy or radiculopathy, cervical region: Secondary | ICD-10-CM | POA: Diagnosis not present

## 2019-06-10 DIAGNOSIS — G894 Chronic pain syndrome: Secondary | ICD-10-CM | POA: Diagnosis not present

## 2019-06-10 DIAGNOSIS — F419 Anxiety disorder, unspecified: Secondary | ICD-10-CM | POA: Diagnosis not present

## 2019-06-27 ENCOUNTER — Emergency Department (HOSPITAL_COMMUNITY)
Admission: EM | Admit: 2019-06-27 | Discharge: 2019-06-27 | Disposition: A | Discharge status: Home / Self Care | Payer: PPO | Attending: Emergency Medicine | Admitting: Emergency Medicine

## 2019-06-27 ENCOUNTER — Encounter (HOSPITAL_COMMUNITY): Payer: Self-pay | Admitting: Emergency Medicine

## 2019-06-27 ENCOUNTER — Other Ambulatory Visit: Payer: Self-pay

## 2019-06-27 ENCOUNTER — Emergency Department (HOSPITAL_COMMUNITY): Payer: PPO

## 2019-06-27 DIAGNOSIS — Z23 Encounter for immunization: Secondary | ICD-10-CM | POA: Diagnosis not present

## 2019-06-27 DIAGNOSIS — Y999 Unspecified external cause status: Secondary | ICD-10-CM | POA: Insufficient documentation

## 2019-06-27 DIAGNOSIS — S68121A Partial traumatic metacarpophalangeal amputation of left index finger, initial encounter: Secondary | ICD-10-CM | POA: Diagnosis not present

## 2019-06-27 DIAGNOSIS — Z96653 Presence of artificial knee joint, bilateral: Secondary | ICD-10-CM | POA: Insufficient documentation

## 2019-06-27 DIAGNOSIS — Y939 Activity, unspecified: Secondary | ICD-10-CM | POA: Insufficient documentation

## 2019-06-27 DIAGNOSIS — S68111A Complete traumatic metacarpophalangeal amputation of left index finger, initial encounter: Secondary | ICD-10-CM | POA: Diagnosis not present

## 2019-06-27 DIAGNOSIS — S68621A Partial traumatic transphalangeal amputation of left index finger, initial encounter: Secondary | ICD-10-CM | POA: Insufficient documentation

## 2019-06-27 DIAGNOSIS — W01111A Fall on same level from slipping, tripping and stumbling with subsequent striking against power tool or machine, initial encounter: Secondary | ICD-10-CM | POA: Diagnosis not present

## 2019-06-27 DIAGNOSIS — Y929 Unspecified place or not applicable: Secondary | ICD-10-CM | POA: Insufficient documentation

## 2019-06-27 MED ORDER — BACITRACIN ZINC 500 UNIT/GM EX OINT: TOPICAL_OINTMENT | Freq: Two times a day (BID) | CUTANEOUS | Status: DC

## 2019-06-27 MED ORDER — TETANUS-DIPHTH-ACELL PERTUSSIS 5-2.5-18.5 LF-MCG/0.5 IM SUSP
0.5000 mL | Freq: Once | INTRAMUSCULAR | Status: AC
  Administered 2019-06-27: 0.5 mL via INTRAMUSCULAR
  Filled 2019-06-27: qty 0.5

## 2019-06-27 NOTE — ED Provider Notes (Signed)
Emergency Department Provider Note   I have reviewed the triage vital signs and the nursing notes.   HISTORY  Chief Complaint Finger Injury   HPI Jesus Brown is a 54 y.o. male who fell today towards a planter resulting in the distal tip of his left pointer finger being amputated by the saw.  No injuries elsewhere.  Last tetanus shot unsure thinks it might have been within the last 5 years.  Nothing for the pain prior to coming.   No other associated or modifying symptoms.    Past Medical History:  Diagnosis Date  . Anxiety    mainly r/t pain  . Arthritis    neck, knees  . Asthma    AS CHILD   . Back pain, chronic   . DDD (degenerative disc disease)   . Dysphagia   . 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 GERD (gastroesophageal reflux disease)   . Hypertension   . Hypotestosteronemia 09/08/2015  . Neck pain, chronic     Patient Active Problem List   Diagnosis Date Noted  . Retained orthopedic hardware 12/19/2017    Class: Chronic  . Closed fracture of right ankle   . Cellulitis of right leg 05/03/2017  . Hypokalemia 05/03/2017  . Hypertension 05/03/2017  . GERD (gastroesophageal reflux disease) 05/03/2017  . Anxiety 05/03/2017  . S/P total knee replacement using cement, right 04/11/2017  . Status post total left knee replacement 09/26/2016  . Presence of left artificial knee joint 07/25/2016  . Pain and swelling of left lower leg 07/25/2016  . Bilateral primary osteoarthritis of knee 07/19/2016    Class: Chronic  . Unilateral primary osteoarthritis, right knee 07/19/2016  . 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 Hypotestosteronemia 09/08/2015  . Cervical spondylosis without myelopathy 03/25/2014    Class: Chronic  . Left cervical radiculopathy 03/25/2014    Class: Chronic  . Cervical spondylosis with radiculopathy 03/25/2014    Past Surgical History:  Procedure Laterality Date  . ANKLE SURGERY    . BACK SURGERY   2010,2011   lumb fusionx2  . CERVICAL FUSION  7989,2119   3 surgeries  . DIRECT LARYNGOSCOPY N/A 11/09/2012   Procedure: DIRECT LARYNGOSCOPY;  Surgeon: Jodi Marble, MD;  Location: [place];  Service: ENT;  Laterality: N/A;  . ESOPHAGOGASTRODUODENOSCOPY N/A 08/03/2012   Procedure: ESOPHAGOGASTRODUODENOSCOPY (EGD);  Surgeon: Arta Silence, MD;  Location: [facility] ENDOSCOPY;  Service: Endoscopy;  Laterality: N/A;  . ESOPHAGOSCOPY W/ BOTOX INJECTION N/A 11/09/2012   Procedure: ESOPHAGOSCOPY POSSIBLE BIOPSY AND POSSIBLE BOTOX INJECTION CRICOPHARYNGEUS;  Surgeon: Jodi Marble, MD;  Location: [place];  Service: ENT;  Laterality: N/A;  Esophagoscopy with botox injection  . HARDWARE REMOVAL Right 12/19/2017   Procedure: REMOVAL OF SMALL FRAGMENT PLATE AND SCREWS RIGHT LATERAL ANKLE;  Surgeon: Jessy Oto, MD;  Location: [place];  Service: Orthopedics;  Laterality: Right;  . HERNIA REPAIR     ing-rt  . KNEE SURGERY     rt and left x2 each=4  . POSTERIOR CERVICAL FUSION/FORAMINOTOMY N/A 03/25/2014   Procedure: LEFT C7-T1 FORAMINOTOMY;  Surgeon: Jessy Oto, MD;  Location: [place];  Service: Orthopedics;  Laterality: N/A;  . [place]   rt and lt  . TONSILLECTOMY    . TOTAL KNEE ARTHROPLASTY Left 07/19/2016   Procedure: LEFT TOTAL KNEE ARTHROPLASTY CORTISON INJECTION IN RIGHT KNEE;  Surgeon: Jessy Oto, MD;  Location: [place];  Service: Orthopedics;  Laterality: Left;  MAY NEED RNFA TO FINISH CASE PER [place]   . TOTAL KNEE ARTHROPLASTY Right 04/11/2017   Procedure: RIGHT TOTAL KNEE ARTHROPLASTY;  Surgeon: Jessy Oto, MD;  Location: [place];  Service: Orthopedics;  Laterality: Right;  . WRIST SURGERY     lt and rt    Current Outpatient Rx  . Order #: [id number] Class: Historical Med  . Order #: [id number] Class: Historical Med  . Order #: [id number] Class: Normal  . Order #: [id number] Class: Historical Med  . Order #: [id number] Class:  Historical Med  . Order #: [id number] Class: Historical Med  . Order #: [id number] Class: Historical Med  . Order #: [id number] Class: Historical Med  . Order #: [id number] Class: Historical Med  . Order #: [id number] Class: Historical Med  . Order #: [id number] Class: Historical Med    Allergies Adhesive [tape], Baclofen, and Indocin [indomethacin]  Family History  Problem Relation Age of Onset  . Hypertension Mother   . Heart attack Father     Social History Social History   Tobacco Use  . Smoking status: Never Smoker  . Smokeless tobacco: Never Used  Substance Use Topics  . Alcohol use: No  . Drug use: No    Review of Systems  All other systems negative except as documented in the HPI. All pertinent positives and negatives as reviewed in the HPI. ____________________________________________   PHYSICAL EXAM:  VITAL SIGNS: ED Triage Vitals  Enc Vitals Group     BP 06/27/19 1222 (!) 150/94     Pulse Rate 06/27/19 1222 (!) 103     Resp --      Temp 06/27/19 1222 97.8 F (36.6 C)     Temp Source 06/27/19 1222 Oral     SpO2 06/27/19 1222 97 %    Constitutional: Alert and oriented. Well appearing and in no acute distress. Eyes: Conjunctivae are normal. PERRL. EOMI. Head: Atraumatic. Nose: No congestion/rhinnorhea. Mouth/Throat: Mucous membranes are moist.  Oropharynx non-erythematous. Neck: No stridor.  No meningeal signs.   Cardiovascular: Normal rate, regular rhythm. Good peripheral circulation. Grossly normal heart sounds.   Respiratory: Normal respiratory effort.  No retractions. Lungs CTAB. Gastrointestinal: Soft and nontender. No distention.  Musculoskeletal: No lower extremity tenderness nor edema. No gross deformities of extremities. Neurologic:  Normal speech and language. No gross focal neurologic deficits are appreciated.  Skin:  Skin is warm, dry and intact. No rash noted.   ____________________________________________   LABS (all labs ordered are listed,  but only abnormal results are displayed)  Labs Reviewed - No data to display ____________________________________________  EKG   EKG Interpretation  Date/Time:    Ventricular Rate:    PR Interval:    QRS Duration:   QT Interval:    QTC Calculation:   R Axis:     Text Interpretation:         ____________________________________________  RADIOLOGY  DG Hand Complete Left  Result Date: 06/27/2019 CLINICAL DATA:  Evaluate for amputation. Left index finger tip. EXAM: LEFT HAND - COMPLETE 3+ VIEW COMPARISON:  Left hand radiographs 05/21/2018. FINDINGS: There is traumatic amputation at the most distal aspect of the left index finger involving a small portion of the bony tuft as well as the adjacent soft tissues. No evidence of dislocation. The remainder of the bones in the left hand are unremarkable. No radiopaque foreign body identified. IMPRESSION: Traumatic amputation of the distal left index finger involving a small portion of the bony tuft as well as the adjacent soft tissues. Electronically Signed   By:  Audie Pinto M.D.   On: 06/27/2019 13:29    ____________________________________________   PROCEDURES  Procedure(s) performed:   Procedures   ____________________________________________   INITIAL IMPRESSION / ASSESSMENT AND PLAN / ED COURSE  Discussed with Dr. Sharol Given on call for patients orthopedics office. Recommended follow up with Dr. Fredna Dow as they have a previous relationship as well. Wound cleaned. tdap updated. Dressed. Doesn't need narcotic rx. Stable for dc.    Pertinent labs & imaging results that were available during my care of the patient were reviewed by me and considered in my medical decision making (see chart for details).   A medical screening exam was performed and I feel the patient has had an appropriate workup for their chief complaint at this time and likelihood of emergent condition existing is low. They have been counseled on decision,  discharge, follow up and which symptoms necessitate immediate return to the emergency department. They or their family verbally stated understanding and agreement with plan and discharged in stable condition.   ____________________________________________  FINAL CLINICAL IMPRESSION(S) / ED DIAGNOSES  Final diagnoses:  None     MEDICATIONS GIVEN DURING THIS VISIT:  Medications  Tdap (BOOSTRIX) injection 0.5 mL (has no administration in time range)     NEW OUTPATIENT MEDICATIONS STARTED DURING THIS VISIT:  New Prescriptions   No medications on file    Note:  This note was prepared with assistance of Dragon voice recognition software. Occasional wrong-word or sound-a-like substitutions may have occurred due to the inherent limitations of voice recognition software.   Devrin Monforte, Corene Cornea, MD 06/28/19 [phone]

## 2019-06-27 NOTE — ED Notes (Signed)
Pt discharge instructions and follow-up instructions reviewed with the patient. The patient verbalized understanding of both. Pt discharged.

## 2019-06-27 NOTE — ED Triage Notes (Addendum)
Pt reports he was using a joyner saw this morning when he tripped and cut the tip of his L index finger off. Bleeding controlled at this time. Thinks last tetanus was within the past 5 years.

## 2019-06-29 ENCOUNTER — Ambulatory Visit (INDEPENDENT_AMBULATORY_CARE_PROVIDER_SITE_OTHER): Payer: PPO | Admitting: Specialist

## 2019-06-29 ENCOUNTER — Other Ambulatory Visit: Payer: Self-pay

## 2019-06-29 ENCOUNTER — Encounter: Payer: Self-pay | Admitting: Specialist

## 2019-06-29 VITALS — BP 117/78 | HR 86 | Ht 68.0 in | Wt 230.0 lb

## 2019-06-29 DIAGNOSIS — S68118A Complete traumatic metacarpophalangeal amputation of other finger, initial encounter: Secondary | ICD-10-CM

## 2019-06-29 DIAGNOSIS — Z89022 Acquired absence of left finger(s): Secondary | ICD-10-CM

## 2019-06-29 DIAGNOSIS — S62633D Displaced fracture of distal phalanx of left middle finger, subsequent encounter for fracture with routine healing: Secondary | ICD-10-CM | POA: Diagnosis not present

## 2019-06-29 DIAGNOSIS — S62638B Displaced fracture of distal phalanx of other finger, initial encounter for open fracture: Secondary | ICD-10-CM | POA: Diagnosis not present

## 2019-06-29 DIAGNOSIS — M79645 Pain in left finger(s): Secondary | ICD-10-CM | POA: Diagnosis not present

## 2019-06-29 DIAGNOSIS — S61209D Unspecified open wound of unspecified finger without damage to nail, subsequent encounter: Secondary | ICD-10-CM | POA: Diagnosis not present

## 2019-06-29 NOTE — Patient Instructions (Signed)
Plan: I called and spoke with Dr. Bertis Brown office and have arranged for Jesus Brown to be seen at Jesus Brown office At [place] [facility] [place], [place].  No charge for eval and referral today.

## 2019-06-29 NOTE — Progress Notes (Signed)
Office Visit Note   Patient: Jesus Brown           Date of Birth: Apr 19, 1965           MRN: [id number] Visit Date: 06/29/2019              Requested by: Merrilee Seashore, [address] [place],  [place] [id number] PCP: Merrilee Seashore, MD   Assessment & Plan: Visit Diagnoses:  1. Traumatic amputation of tip of index finger, initial encounter     Plan: I called and spoke with Dr. Bertis Ruddy office and have arranged for Mr. Boshers to be seen at Dr. Jeral Fruit office At [place], [place].  No charge for eval and referral today to be seen by Dr. Burney Gauze.   Follow-Up Instructions: No follow-ups on file.   Orders:  No orders of the defined types were placed in this encounter.  No orders of the defined types were placed in this encounter.     Procedures: No procedures performed   Clinical Data: No additional findings.   Subjective: Chief Complaint  Patient presents with  . Left Index Finger - Injury, Open Wound, Wound Check    54 year old right handed male injured his left index finger tip when slipping while using a joiner planer at home on Sunday AM. Seen at the [facility] ER and referred for evaluation. The finger treated with evaluation, xray and dressing and then discharged.    Review of Systems  Constitutional: Negative.   HENT: Negative.   Eyes: Negative.   Respiratory: Negative.   Cardiovascular: Negative.   Gastrointestinal: Negative.   Endocrine: Negative.   Genitourinary: Negative.   Musculoskeletal: Negative.   Skin: Negative.   Allergic/Immunologic: Negative.   Neurological: Negative.   Hematological: Negative.   Psychiatric/Behavioral: Negative.      Objective: Vital Signs: BP 117/78 (BP Location: Left Arm, Patient Position: Sitting)   Pulse 86   Ht 5\' 8"  (1.727 m)   Wt 230 lb (104.3 kg)   BMI 34.97 kg/m   Physical Exam Constitutional:      Appearance: He is well-developed.  HENT:       Head: Normocephalic and atraumatic.  Eyes:     Pupils: Pupils are equal, round, and reactive to light.  Pulmonary:     Effort: Pulmonary effort is normal.     Breath sounds: Normal breath sounds.  Abdominal:     General: Bowel sounds are normal.     Palpations: Abdomen is soft.  Musculoskeletal:        General: Normal range of motion.     Cervical back: Normal range of motion and neck supple.  Skin:    General: Skin is warm and dry.  Neurological:     Mental Status: He is alert and oriented to person, place, and time.  Psychiatric:        Behavior: Behavior normal.        Thought Content: Thought content normal.        Judgment: Judgment normal.     Right Hand Exam  Right hand exam is normal.   Left Hand Exam   Tenderness  The patient is experiencing tenderness in the radial area, dorsal area and palmar area.   Range of Motion  Wrist  Extension: normal  Flexion: normal  Pronation: normal  Supination: normal  Hand  MP Thumb: normal  MP Index: normal  MP Middle: normal  MP Ring: normal  MP  Little: normal   Muscle Strength  Wrist extension: 5/5  Wrist flexion: 5/5  Grip:  5/5   Tests  Phalen's Sign: negative Tinel's sign (median nerve): positive Finkelstein's test: negative  Other  Erythema: present Sensation: normal Pulse: present  Comments:  Left partial amputation of the index finder tip ulna about 1/2 of the length of the nail mattrix. Oblique through the left index nail plate. Patient has photo of the injury. Oblique ulna tip is gone with exposed distal phalanx      Specialty Comments:  No specialty comments available.  Imaging: No results found.   PMFS History: Patient Active Problem List   Diagnosis Date Noted  . Retained orthopedic hardware 12/19/2017    Priority: High    Class: Chronic  . Bilateral primary osteoarthritis of knee 07/19/2016    Priority: High    Class: Chronic  . Cervical spondylosis without myelopathy  03/25/2014    Priority: High    Class: Chronic  . Left cervical radiculopathy 03/25/2014    Priority: High    Class: Chronic  . Closed fracture of right ankle   . Cellulitis of right leg 05/03/2017  . Hypokalemia 05/03/2017  . Hypertension 05/03/2017  . GERD (gastroesophageal reflux disease) 05/03/2017  . Anxiety 05/03/2017  . S/P total knee replacement using cement, right 04/11/2017  . Status post total left knee replacement 09/26/2016  . Presence of left artificial knee joint 07/25/2016  . Pain and swelling of left lower leg 07/25/2016  . Unilateral primary osteoarthritis, right knee 07/19/2016  . 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 Hypotestosteronemia 09/08/2015  . Cervical spondylosis with radiculopathy 03/25/2014   Past Medical History:  Diagnosis Date  . Anxiety    mainly r/t pain  . Arthritis    neck, knees  . Asthma    AS CHILD   . Back pain, chronic   . DDD (degenerative disc disease)   . Dysphagia   . 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 GERD (gastroesophageal reflux disease)   . Hypertension   . Hypotestosteronemia 09/08/2015  . Neck pain, chronic     Family History  Problem Relation Age of Onset  . Hypertension Mother   . Heart attack Father     Past Surgical History:  Procedure Laterality Date  . ANKLE SURGERY    . BACK SURGERY  2010,2011   lumb fusionx2  . CERVICAL FUSION  9390,3009   3 surgeries  . DIRECT LARYNGOSCOPY N/A 11/09/2012   Procedure: DIRECT LARYNGOSCOPY;  Surgeon: Jodi Marble, MD;  Location: [facility];  Service: ENT;  Laterality: N/A;  . ESOPHAGOGASTRODUODENOSCOPY N/A 08/03/2012   Procedure: ESOPHAGOGASTRODUODENOSCOPY (EGD);  Surgeon: Arta Silence, MD;  Location: [facility] ENDOSCOPY;  Service: Endoscopy;  Laterality: N/A;  . ESOPHAGOSCOPY W/ BOTOX INJECTION N/A 11/09/2012   Procedure: ESOPHAGOSCOPY POSSIBLE BIOPSY AND POSSIBLE BOTOX INJECTION CRICOPHARYNGEUS;  Surgeon: Jodi Marble, MD;   Location: [facility];  Service: ENT;  Laterality: N/A;  Esophagoscopy with botox injection  . HARDWARE REMOVAL Right 12/19/2017   Procedure: REMOVAL OF SMALL FRAGMENT PLATE AND SCREWS RIGHT LATERAL ANKLE;  Surgeon: Jessy Oto, MD;  Location: [place];  Service: Orthopedics;  Laterality: Right;  . HERNIA REPAIR     ing-rt  . KNEE SURGERY     rt and left x2 each=4  . POSTERIOR CERVICAL FUSION/FORAMINOTOMY N/A 03/25/2014   Procedure: LEFT C7-T1 FORAMINOTOMY;  Surgeon: Jessy Oto, MD;  Location: [facility];  Service: Orthopedics;  Laterality: N/A;  . [place]   rt and lt  . TONSILLECTOMY    . TOTAL KNEE ARTHROPLASTY Left 07/19/2016   Procedure: LEFT TOTAL KNEE ARTHROPLASTY CORTISON INJECTION IN RIGHT KNEE;  Surgeon: Jessy Oto, MD;  Location: [facility];  Service: Orthopedics;  Laterality: Left;  MAY NEED RNFA TO FINISH CASE PER [place]   . TOTAL KNEE ARTHROPLASTY Right 04/11/2017   Procedure: RIGHT TOTAL KNEE ARTHROPLASTY;  Surgeon: Jessy Oto, MD;  Location: [facility];  Service: Orthopedics;  Laterality: Right;  . WRIST SURGERY     lt and rt   Social History   Occupational History  . Not on file  Tobacco Use  . Smoking status: Never Smoker  . Smokeless tobacco: Never Used  Substance and Sexual Activity  . Alcohol use: No  . Drug use: No  . Sexual activity: Not on file

## 2019-07-06 DIAGNOSIS — M79642 Pain in left hand: Secondary | ICD-10-CM | POA: Diagnosis not present

## 2019-07-06 DIAGNOSIS — M79641 Pain in right hand: Secondary | ICD-10-CM | POA: Diagnosis not present

## 2019-07-06 DIAGNOSIS — G5603 Carpal tunnel syndrome, bilateral upper limbs: Secondary | ICD-10-CM | POA: Diagnosis not present

## 2019-07-06 DIAGNOSIS — S62638B Displaced fracture of distal phalanx of other finger, initial encounter for open fracture: Secondary | ICD-10-CM | POA: Diagnosis not present

## 2019-07-08 DIAGNOSIS — F419 Anxiety disorder, unspecified: Secondary | ICD-10-CM | POA: Diagnosis not present

## 2019-07-08 DIAGNOSIS — M47812 Spondylosis without myelopathy or radiculopathy, cervical region: Secondary | ICD-10-CM | POA: Diagnosis not present

## 2019-07-08 DIAGNOSIS — G894 Chronic pain syndrome: Secondary | ICD-10-CM | POA: Diagnosis not present

## 2019-07-08 DIAGNOSIS — J3089 Other allergic rhinitis: Secondary | ICD-10-CM | POA: Diagnosis not present

## 2019-07-08 DIAGNOSIS — J301 Allergic rhinitis due to pollen: Secondary | ICD-10-CM | POA: Diagnosis not present

## 2019-07-08 DIAGNOSIS — M62830 Muscle spasm of back: Secondary | ICD-10-CM | POA: Diagnosis not present

## 2019-07-08 DIAGNOSIS — J3081 Allergic rhinitis due to animal (cat) (dog) hair and dander: Secondary | ICD-10-CM | POA: Diagnosis not present

## 2019-07-09 DIAGNOSIS — X58XXXA Exposure to other specified factors, initial encounter: Secondary | ICD-10-CM | POA: Diagnosis not present

## 2019-07-09 DIAGNOSIS — S68621A Partial traumatic transphalangeal amputation of left index finger, initial encounter: Secondary | ICD-10-CM | POA: Diagnosis not present

## 2019-07-09 DIAGNOSIS — Y999 Unspecified external cause status: Secondary | ICD-10-CM | POA: Diagnosis not present

## 2019-07-16 DIAGNOSIS — J301 Allergic rhinitis due to pollen: Secondary | ICD-10-CM | POA: Diagnosis not present

## 2019-07-16 DIAGNOSIS — J3089 Other allergic rhinitis: Secondary | ICD-10-CM | POA: Diagnosis not present

## 2019-07-16 DIAGNOSIS — J3081 Allergic rhinitis due to animal (cat) (dog) hair and dander: Secondary | ICD-10-CM | POA: Diagnosis not present

## 2019-07-20 DIAGNOSIS — J3081 Allergic rhinitis due to animal (cat) (dog) hair and dander: Secondary | ICD-10-CM | POA: Diagnosis not present

## 2019-07-20 DIAGNOSIS — J301 Allergic rhinitis due to pollen: Secondary | ICD-10-CM | POA: Diagnosis not present

## 2019-07-20 DIAGNOSIS — J3089 Other allergic rhinitis: Secondary | ICD-10-CM | POA: Diagnosis not present

## 2019-07-27 DIAGNOSIS — J3081 Allergic rhinitis due to animal (cat) (dog) hair and dander: Secondary | ICD-10-CM | POA: Diagnosis not present

## 2019-07-27 DIAGNOSIS — J3089 Other allergic rhinitis: Secondary | ICD-10-CM | POA: Diagnosis not present

## 2019-07-27 DIAGNOSIS — J301 Allergic rhinitis due to pollen: Secondary | ICD-10-CM | POA: Diagnosis not present

## 2019-07-29 DIAGNOSIS — M7552 Bursitis of left shoulder: Secondary | ICD-10-CM | POA: Diagnosis not present

## 2019-08-05 DIAGNOSIS — M47812 Spondylosis without myelopathy or radiculopathy, cervical region: Secondary | ICD-10-CM | POA: Diagnosis not present

## 2019-08-05 DIAGNOSIS — F419 Anxiety disorder, unspecified: Secondary | ICD-10-CM | POA: Diagnosis not present

## 2019-08-05 DIAGNOSIS — M62830 Muscle spasm of back: Secondary | ICD-10-CM | POA: Diagnosis not present

## 2019-08-05 DIAGNOSIS — G894 Chronic pain syndrome: Secondary | ICD-10-CM | POA: Diagnosis not present

## 2019-08-10 DIAGNOSIS — J3089 Other allergic rhinitis: Secondary | ICD-10-CM | POA: Diagnosis not present

## 2019-08-10 DIAGNOSIS — J3081 Allergic rhinitis due to animal (cat) (dog) hair and dander: Secondary | ICD-10-CM | POA: Diagnosis not present

## 2019-08-10 DIAGNOSIS — J301 Allergic rhinitis due to pollen: Secondary | ICD-10-CM | POA: Diagnosis not present

## 2019-08-13 DIAGNOSIS — J3089 Other allergic rhinitis: Secondary | ICD-10-CM | POA: Diagnosis not present

## 2019-08-13 DIAGNOSIS — J301 Allergic rhinitis due to pollen: Secondary | ICD-10-CM | POA: Diagnosis not present

## 2019-08-13 DIAGNOSIS — J3081 Allergic rhinitis due to animal (cat) (dog) hair and dander: Secondary | ICD-10-CM | POA: Diagnosis not present

## 2019-09-01 DIAGNOSIS — F419 Anxiety disorder, unspecified: Secondary | ICD-10-CM | POA: Diagnosis not present

## 2019-09-01 DIAGNOSIS — G894 Chronic pain syndrome: Secondary | ICD-10-CM | POA: Diagnosis not present

## 2019-09-01 DIAGNOSIS — M47812 Spondylosis without myelopathy or radiculopathy, cervical region: Secondary | ICD-10-CM | POA: Diagnosis not present

## 2019-09-01 DIAGNOSIS — M62830 Muscle spasm of back: Secondary | ICD-10-CM | POA: Diagnosis not present

## 2019-09-21 DIAGNOSIS — R7301 Impaired fasting glucose: Secondary | ICD-10-CM | POA: Diagnosis not present

## 2019-09-29 DIAGNOSIS — G894 Chronic pain syndrome: Secondary | ICD-10-CM | POA: Diagnosis not present

## 2019-09-29 DIAGNOSIS — M47812 Spondylosis without myelopathy or radiculopathy, cervical region: Secondary | ICD-10-CM | POA: Diagnosis not present

## 2019-09-29 DIAGNOSIS — M62830 Muscle spasm of back: Secondary | ICD-10-CM | POA: Diagnosis not present

## 2019-09-29 DIAGNOSIS — F419 Anxiety disorder, unspecified: Secondary | ICD-10-CM | POA: Diagnosis not present

## 2019-10-05 DIAGNOSIS — I10 Essential (primary) hypertension: Secondary | ICD-10-CM | POA: Diagnosis not present

## 2019-10-05 DIAGNOSIS — Z0000 Encounter for general adult medical examination without abnormal findings: Secondary | ICD-10-CM | POA: Diagnosis not present

## 2019-10-05 DIAGNOSIS — K219 Gastro-esophageal reflux disease without esophagitis: Secondary | ICD-10-CM | POA: Diagnosis not present

## 2019-10-05 DIAGNOSIS — R7301 Impaired fasting glucose: Secondary | ICD-10-CM | POA: Diagnosis not present

## 2019-10-05 DIAGNOSIS — E291 Testicular hypofunction: Secondary | ICD-10-CM | POA: Diagnosis not present

## 2019-10-05 DIAGNOSIS — E669 Obesity, unspecified: Secondary | ICD-10-CM | POA: Diagnosis not present

## 2019-10-05 DIAGNOSIS — D751 Secondary polycythemia: Secondary | ICD-10-CM | POA: Diagnosis not present

## 2019-10-26 DIAGNOSIS — M47812 Spondylosis without myelopathy or radiculopathy, cervical region: Secondary | ICD-10-CM | POA: Diagnosis not present

## 2019-10-26 DIAGNOSIS — F419 Anxiety disorder, unspecified: Secondary | ICD-10-CM | POA: Diagnosis not present

## 2019-10-26 DIAGNOSIS — G894 Chronic pain syndrome: Secondary | ICD-10-CM | POA: Diagnosis not present

## 2019-10-26 DIAGNOSIS — M62830 Muscle spasm of back: Secondary | ICD-10-CM | POA: Diagnosis not present

## 2019-11-15 DIAGNOSIS — J3081 Allergic rhinitis due to animal (cat) (dog) hair and dander: Secondary | ICD-10-CM | POA: Diagnosis not present

## 2019-11-15 DIAGNOSIS — J3089 Other allergic rhinitis: Secondary | ICD-10-CM | POA: Diagnosis not present

## 2019-11-25 ENCOUNTER — Other Ambulatory Visit: Payer: Self-pay

## 2019-11-25 ENCOUNTER — Ambulatory Visit (INDEPENDENT_AMBULATORY_CARE_PROVIDER_SITE_OTHER): Payer: PPO

## 2019-11-25 ENCOUNTER — Ambulatory Visit: Payer: PPO | Admitting: Podiatry

## 2019-11-25 ENCOUNTER — Encounter: Payer: Self-pay | Admitting: Podiatry

## 2019-11-25 DIAGNOSIS — M109 Gout, unspecified: Secondary | ICD-10-CM

## 2019-11-25 DIAGNOSIS — M779 Enthesopathy, unspecified: Secondary | ICD-10-CM | POA: Diagnosis not present

## 2019-11-25 DIAGNOSIS — R52 Pain, unspecified: Secondary | ICD-10-CM

## 2019-11-25 MED ORDER — METHYLPREDNISOLONE 4 MG PO TBPK: ORAL_TABLET | ORAL | 0 refills | Status: DC

## 2019-11-25 NOTE — Patient Instructions (Signed)
 Gout  Gout is painful swelling of your joints. Gout is a type of arthritis. It is caused by having too much uric acid in your body. Uric acid is a chemical that is made when your body breaks down substances called purines. If your body has too much uric acid, sharp crystals can form and build up in your joints. This causes pain and swelling. Gout attacks can happen quickly and be very painful (acute gout). Over time, the attacks can affect more joints and happen more often (chronic gout). What are the causes?  Too much uric acid in your blood. This can happen because: ? Your kidneys do not remove enough uric acid from your blood. ? Your body makes too much uric acid. ? You eat too many foods that are high in purines. These foods include organ meats, some seafood, and beer.  Trauma or stress. What increases the risk?  Having a family history of gout.  Being male and middle-aged.  Being male and having gone through menopause.  Being very overweight (obese).  Drinking alcohol, especially beer.  Not having enough water in the body (being dehydrated).  Losing weight too quickly.  Having an organ transplant.  Having lead poisoning.  Taking certain medicines.  Having kidney disease.  Having a skin condition called psoriasis. What are the signs or symptoms? An attack of acute gout usually happens in just one joint. The most common place is the big toe. Attacks often start at night. Other joints that may be affected include joints of the feet, ankle, knee, fingers, wrist, or elbow. Symptoms of an attack may include:  Very bad pain.  Warmth.  Swelling.  Stiffness.  Shiny, red, or purple skin.  Tenderness. The affected joint may be very painful to touch.  Chills and fever. Chronic gout may cause symptoms more often. More joints may be involved. You may also have white or yellow lumps (tophi) on your hands or feet or in other areas near your joints. How is this  treated?  Treatment for this condition has two phases: treating an acute attack and preventing future attacks.  Acute gout treatment may include: ? NSAIDs. ? Steroids. These are taken by mouth or injected into a joint. ? Colchicine. This medicine relieves pain and swelling. It can be given by mouth or through an IV tube.  Preventive treatment may include: ? Taking small doses of NSAIDs or colchicine daily. ? Using a medicine that reduces uric acid levels in your blood. ? Making changes to your diet. You may need to see a food expert (dietitian) about what to eat and drink to prevent gout. Follow these instructions at home: During a gout attack   If told, put ice on the painful area: ? Put ice in a plastic bag. ? Place a towel between your skin and the bag. ? Leave the ice on for 20 minutes, 2-3 times a day.  Raise (elevate) the painful joint above the level of your heart as often as you can.  Rest the joint as much as possible. If the joint is in your leg, you may be given crutches.  Follow instructions from your doctor about what you cannot eat or drink. Avoiding future gout attacks  Eat a low-purine diet. Avoid foods and drinks such as: ? Liver. ? Kidney. ? Anchovies. ? Asparagus. ? Herring. ? Mushrooms. ? Mussels. ? Beer.  Stay at a healthy weight. If you want to lose weight, talk with your doctor. Do not lose   weight too fast.  Start or continue an exercise plan as told by your doctor. Eating and drinking  Drink enough fluids to keep your pee (urine) pale yellow.  If you drink alcohol: ? Limit how much you use to:  0-1 drink a day for women.  0-2 drinks a day for men. ? Be aware of how much alcohol is in your drink. In the [place] one drink equals one 12 oz bottle of beer (355 mL), one 5 oz glass of wine (148 mL), or one 1 oz glass of hard liquor (44 mL). General instructions  Take over-the-counter and prescription medicines only as told by your doctor.  Do  not drive or use heavy machinery while taking prescription pain medicine.  Return to your normal activities as told by your doctor. Ask your doctor what activities are safe for you.  Keep all follow-up visits as told by your doctor. This is important. Contact a doctor if:  You have another gout attack.  You still have symptoms of a gout attack after 10 days of treatment.  You have problems (side effects) because of your medicines.  You have chills or a fever.  You have burning pain when you pee (urinate).  You have pain in your lower back or belly. Get help right away if:  You have very bad pain.  Your pain cannot be controlled.  You cannot pee. Summary  Gout is painful swelling of the joints.  The most common site of pain is the big toe, but it can affect other joints.  Medicines and avoiding some foods can help to prevent and treat gout attacks. This information is not intended to replace advice given to you by your health care provider. Make sure you discuss any questions you have with your health care provider. Document Revised: 10/15/2017 Document Reviewed: 10/15/2017 Elsevier Patient Education  2020 [employer] Inc[employer]  

## 2019-11-26 NOTE — Progress Notes (Signed)
Subjective:   Patient ID: Jesus Brown, male   DOB: 54 y.o.   MRN: [id number]   HPI Patient states he has had some redness in his big toe joint right and has had a history of gout with several attacks a year consistently over the last 5 years.  He does take allopurinol and colchicine when he does get these problems.  Patient does not smoke and is not significantly active   Review of Systems  All other systems reviewed and are negative.       Objective:  Physical Exam Vitals and nursing note reviewed.  Constitutional:      Appearance: He is well-developed.  Pulmonary:     Effort: Pulmonary effort is normal.  Musculoskeletal:        General: Normal range of motion.  Skin:    General: Skin is warm.  Neurological:     Mental Status: He is alert.     Neurovascular status intact muscle strength was found to be adequate range of motion adequate.  Patient has quite a bit of redness and inflammation around the first MPJ right that is very tender when pressed and makes it very difficult for him to sleep or have anything touches foot.  States it is been this way for about 4 days.  Patient has good digit perfusion well oriented x3     Assessment:  Acute gout attack right first MPJ with inflammatory changes around the joint surface     Plan:  H&P x-rays reviewed and blood work ordered.  Today I went ahead did sterile prep and injected the first MPJ 3 mg Kenalog 5 mg Xylocaine to reduce the acute inflammation placed him on 6-day steroid Dosepak and I will review blood work with him next week with considerations at 1 point he may need to change medicines.  Gave him sheets that talked about gout and foods to avoid and reappoint to recheck  X-rays indicate that there is some inflammation around the first MPJ but I did not see signs of acute pathology or structural deformity

## 2019-11-28 LAB — URIC ACID: Uric Acid, Serum: 7.5 mg/dL (ref 4.0–8.0)

## 2019-11-28 LAB — ANTI-NUCLEAR AB-TITER (ANA TITER): ANA Titer 1: 1:40 {titer} — ABNORMAL HIGH

## 2019-11-28 LAB — ANA: Anti Nuclear Antibody (ANA): POSITIVE — AB

## 2019-11-28 LAB — SEDIMENTATION RATE: Sed Rate: 2 mm/h (ref 0–20)

## 2019-11-28 LAB — RHEUMATOID FACTOR: Rhuematoid fact SerPl-aCnc: <14 IU/mL (ref <14)

## 2019-11-28 LAB — C-REACTIVE PROTEIN: CRP: 8.4 mg/L — ABNORMAL HIGH (ref <8.0)

## 2019-11-30 DIAGNOSIS — J3089 Other allergic rhinitis: Secondary | ICD-10-CM | POA: Diagnosis not present

## 2019-11-30 DIAGNOSIS — J3081 Allergic rhinitis due to animal (cat) (dog) hair and dander: Secondary | ICD-10-CM | POA: Diagnosis not present

## 2019-11-30 DIAGNOSIS — J301 Allergic rhinitis due to pollen: Secondary | ICD-10-CM | POA: Diagnosis not present

## 2019-12-02 ENCOUNTER — Other Ambulatory Visit: Payer: Self-pay

## 2019-12-02 ENCOUNTER — Ambulatory Visit: Payer: PPO | Admitting: Podiatry

## 2019-12-02 ENCOUNTER — Encounter: Payer: Self-pay | Admitting: Podiatry

## 2019-12-02 VITALS — Temp 95.0°F

## 2019-12-02 DIAGNOSIS — M109 Gout, unspecified: Secondary | ICD-10-CM | POA: Diagnosis not present

## 2019-12-02 DIAGNOSIS — M779 Enthesopathy, unspecified: Secondary | ICD-10-CM | POA: Diagnosis not present

## 2019-12-02 MED ORDER — ALLOPURINOL 100 MG PO TABS: 100.0000 mg | ORAL_TABLET | Freq: Every day | ORAL | 6 refills | Status: DC

## 2019-12-03 ENCOUNTER — Other Ambulatory Visit: Payer: Self-pay | Admitting: Podiatry

## 2019-12-03 DIAGNOSIS — M109 Gout, unspecified: Secondary | ICD-10-CM

## 2019-12-03 LAB — SYNOVIAL FLUID, CRYSTAL

## 2019-12-03 NOTE — Progress Notes (Signed)
Subjective:   Patient ID: Jesus Brown, male   DOB: 54 y.o.   MRN: [id number]   HPI Patient presents stating I am feeling better around my big toe joint but I am getting some pain down the second joint that is new.  Patient states that it is quite swollen and sore and makes it hard to walk   ROS      Objective:  Physical Exam  Neurovascular status was found to be intact with fluid buildup around the second MPJ right with the big toe joint improved     Assessment:  Inflammatory capsulitis of the second MPJ right improved of the first metatarsal right along with gout most likely with blood work to review     Plan:  I reviewed blood work which indicated positive ANA and positive elevated reactive protein with uric acid in the high normal range.  I would still say most likely given his attacks over the years this is gout and we are going to start him on allopurinol and see the results I did go ahead today I did a block of the second MPJ 60 mg like Marcaine mixture sterile prep done I aspirated the joint getting out fluid I am sending this in for synovial analysis.  Patient will be seen back when we get results and I did make him aware of everything that has to do with this and what we are trying to accomplish

## 2019-12-07 DIAGNOSIS — M62830 Muscle spasm of back: Secondary | ICD-10-CM | POA: Diagnosis not present

## 2019-12-07 DIAGNOSIS — F419 Anxiety disorder, unspecified: Secondary | ICD-10-CM | POA: Diagnosis not present

## 2019-12-07 DIAGNOSIS — G894 Chronic pain syndrome: Secondary | ICD-10-CM | POA: Diagnosis not present

## 2019-12-07 DIAGNOSIS — M47812 Spondylosis without myelopathy or radiculopathy, cervical region: Secondary | ICD-10-CM | POA: Diagnosis not present

## 2019-12-23 ENCOUNTER — Ambulatory Visit: Payer: PPO | Admitting: Podiatry

## 2019-12-24 ENCOUNTER — Ambulatory Visit: Payer: PPO | Admitting: Podiatry

## 2019-12-31 DIAGNOSIS — J3081 Allergic rhinitis due to animal (cat) (dog) hair and dander: Secondary | ICD-10-CM | POA: Diagnosis not present

## 2019-12-31 DIAGNOSIS — J301 Allergic rhinitis due to pollen: Secondary | ICD-10-CM | POA: Diagnosis not present

## 2019-12-31 DIAGNOSIS — J3089 Other allergic rhinitis: Secondary | ICD-10-CM | POA: Diagnosis not present

## 2020-01-03 ENCOUNTER — Ambulatory Visit: Payer: PPO | Admitting: Podiatry

## 2020-01-06 DIAGNOSIS — G894 Chronic pain syndrome: Secondary | ICD-10-CM | POA: Diagnosis not present

## 2020-01-06 DIAGNOSIS — M47812 Spondylosis without myelopathy or radiculopathy, cervical region: Secondary | ICD-10-CM | POA: Diagnosis not present

## 2020-01-06 DIAGNOSIS — M62830 Muscle spasm of back: Secondary | ICD-10-CM | POA: Diagnosis not present

## 2020-01-06 DIAGNOSIS — F419 Anxiety disorder, unspecified: Secondary | ICD-10-CM | POA: Diagnosis not present

## 2020-01-13 ENCOUNTER — Encounter: Payer: Self-pay | Admitting: Podiatry

## 2020-01-13 ENCOUNTER — Other Ambulatory Visit: Payer: Self-pay

## 2020-01-13 ENCOUNTER — Ambulatory Visit: Payer: PPO | Admitting: Podiatry

## 2020-01-13 DIAGNOSIS — M109 Gout, unspecified: Secondary | ICD-10-CM

## 2020-01-13 DIAGNOSIS — M779 Enthesopathy, unspecified: Secondary | ICD-10-CM

## 2020-01-15 NOTE — Progress Notes (Signed)
Subjective:   Patient ID: Jesus Brown, male   DOB: 54 y.o.   MRN: [id number]   HPI Patient states recently I seem to be doing somewhat better and I think medication seems to be helping me   ROS      Objective:  Physical Exam  Neurovascular status intact with diminishment of discomfort around the second MPJ right with pain still present only upon deep palpation good range of motion of the joint     Assessment:  Seems to be doing well post inflammatory capsulitis with probability for systemic gout     Plan:  Reviewed condition and treatment options.  Working to keep him on allopurinol for the next 4 to 6 months and then I want him to follow-up with family physician to have his uric acid rechecked and if it is normal he may be able to consider stopping the medicine and just doing diet modification.  Patient is continuing to lose weight continuing to watch his diet and the types of foods he eats

## 2020-01-28 DIAGNOSIS — J3081 Allergic rhinitis due to animal (cat) (dog) hair and dander: Secondary | ICD-10-CM | POA: Diagnosis not present

## 2020-01-28 DIAGNOSIS — J301 Allergic rhinitis due to pollen: Secondary | ICD-10-CM | POA: Diagnosis not present

## 2020-01-28 DIAGNOSIS — J3089 Other allergic rhinitis: Secondary | ICD-10-CM | POA: Diagnosis not present

## 2020-02-02 DIAGNOSIS — J3089 Other allergic rhinitis: Secondary | ICD-10-CM | POA: Diagnosis not present

## 2020-02-02 DIAGNOSIS — J3081 Allergic rhinitis due to animal (cat) (dog) hair and dander: Secondary | ICD-10-CM | POA: Diagnosis not present

## 2020-02-02 DIAGNOSIS — J301 Allergic rhinitis due to pollen: Secondary | ICD-10-CM | POA: Diagnosis not present

## 2020-02-03 DIAGNOSIS — Z79891 Long term (current) use of opiate analgesic: Secondary | ICD-10-CM | POA: Diagnosis not present

## 2020-02-03 DIAGNOSIS — M47812 Spondylosis without myelopathy or radiculopathy, cervical region: Secondary | ICD-10-CM | POA: Diagnosis not present

## 2020-02-03 DIAGNOSIS — G894 Chronic pain syndrome: Secondary | ICD-10-CM | POA: Diagnosis not present

## 2020-02-03 DIAGNOSIS — F419 Anxiety disorder, unspecified: Secondary | ICD-10-CM | POA: Diagnosis not present

## 2020-02-03 DIAGNOSIS — M62830 Muscle spasm of back: Secondary | ICD-10-CM | POA: Diagnosis not present

## 2020-03-06 DIAGNOSIS — G894 Chronic pain syndrome: Secondary | ICD-10-CM | POA: Diagnosis not present

## 2020-03-06 DIAGNOSIS — F419 Anxiety disorder, unspecified: Secondary | ICD-10-CM | POA: Diagnosis not present

## 2020-03-06 DIAGNOSIS — M47812 Spondylosis without myelopathy or radiculopathy, cervical region: Secondary | ICD-10-CM | POA: Diagnosis not present

## 2020-03-06 DIAGNOSIS — M62830 Muscle spasm of back: Secondary | ICD-10-CM | POA: Diagnosis not present

## 2020-03-13 ENCOUNTER — Encounter (HOSPITAL_COMMUNITY): Payer: Self-pay | Admitting: Emergency Medicine

## 2020-03-13 ENCOUNTER — Other Ambulatory Visit: Payer: Self-pay

## 2020-03-13 ENCOUNTER — Emergency Department (HOSPITAL_COMMUNITY)
Admission: EM | Admit: 2020-03-13 | Discharge: 2020-03-14 | Disposition: A | Discharge status: Home / Self Care | Payer: PPO | Attending: Emergency Medicine | Admitting: Emergency Medicine

## 2020-03-13 DIAGNOSIS — K56600 Partial intestinal obstruction, unspecified as to cause: Secondary | ICD-10-CM | POA: Diagnosis not present

## 2020-03-13 DIAGNOSIS — R109 Unspecified abdominal pain: Secondary | ICD-10-CM | POA: Diagnosis not present

## 2020-03-13 DIAGNOSIS — Z5321 Procedure and treatment not carried out due to patient leaving prior to being seen by health care provider: Secondary | ICD-10-CM | POA: Diagnosis not present

## 2020-03-13 DIAGNOSIS — R197 Diarrhea, unspecified: Secondary | ICD-10-CM | POA: Diagnosis not present

## 2020-03-13 LAB — URINALYSIS, ROUTINE W REFLEX MICROSCOPIC
Bilirubin Urine: NEGATIVE
Glucose, UA: NEGATIVE mg/dL
Hgb urine dipstick: NEGATIVE
Ketones, ur: NEGATIVE mg/dL
Leukocytes,Ua: NEGATIVE
Nitrite: NEGATIVE
Protein, ur: NEGATIVE mg/dL
Specific Gravity, Urine: 1.021 (ref 1.005–1.030)
pH: 6 (ref 5.0–8.0)

## 2020-03-13 LAB — CBC
HCT: 42.9 % (ref 39.0–52.0)
Hemoglobin: 12 g/dL — ABNORMAL LOW (ref 13.0–17.0)
MCH: 20.3 pg — ABNORMAL LOW (ref 26.0–34.0)
MCHC: 28 g/dL — ABNORMAL LOW (ref 30.0–36.0)
MCV: 72.5 fL — ABNORMAL LOW (ref 80.0–100.0)
Platelets: 301 10*3/uL (ref 150–400)
RBC: 5.92 MIL/uL — ABNORMAL HIGH (ref 4.22–5.81)
RDW: 19.2 % — ABNORMAL HIGH (ref 11.5–15.5)
WBC: 8.5 10*3/uL (ref 4.0–10.5)
nRBC: 0 % (ref 0.0–0.2)

## 2020-03-13 LAB — COMPREHENSIVE METABOLIC PANEL
ALT: 40 U/L (ref 0–44)
AST: 36 U/L (ref 15–41)
Albumin: 3.7 g/dL (ref 3.5–5.0)
Alkaline Phosphatase: 75 U/L (ref 38–126)
Anion gap: 12 (ref 5–15)
BUN: 17 mg/dL (ref 6–20)
CO2: 22 mmol/L (ref 22–32)
Calcium: 9.2 mg/dL (ref 8.9–10.3)
Chloride: 103 mmol/L (ref 98–111)
Creatinine, Ser: 0.93 mg/dL (ref 0.61–1.24)
GFR, Estimated: >60 mL/min (ref >60)
Glucose, Bld: 100 mg/dL — ABNORMAL HIGH (ref 70–99)
Potassium: 3.7 mmol/L (ref 3.5–5.1)
Sodium: 137 mmol/L (ref 135–145)
Total Bilirubin: 0.6 mg/dL (ref 0.3–1.2)
Total Protein: 6.8 g/dL (ref 6.5–8.1)

## 2020-03-13 LAB — LIPASE, BLOOD: Lipase: 24 U/L (ref 11–51)

## 2020-03-13 NOTE — ED Triage Notes (Signed)
Pt c/o abd pain with diarrhea for the past week not getting better.

## 2020-03-14 ENCOUNTER — Emergency Department (HOSPITAL_COMMUNITY): Payer: No Typology Code available for payment source

## 2020-03-14 ENCOUNTER — Emergency Department (HOSPITAL_COMMUNITY)
Admission: EM | Admit: 2020-03-14 | Discharge: 2020-03-14 | Disposition: A | Discharge status: Home / Self Care | Payer: No Typology Code available for payment source | Attending: Emergency Medicine | Admitting: Emergency Medicine

## 2020-03-14 DIAGNOSIS — Z79899 Other long term (current) drug therapy: Secondary | ICD-10-CM | POA: Insufficient documentation

## 2020-03-14 DIAGNOSIS — R110 Nausea: Secondary | ICD-10-CM | POA: Insufficient documentation

## 2020-03-14 DIAGNOSIS — Z96652 Presence of left artificial knee joint: Secondary | ICD-10-CM | POA: Insufficient documentation

## 2020-03-14 DIAGNOSIS — Z96651 Presence of right artificial knee joint: Secondary | ICD-10-CM | POA: Insufficient documentation

## 2020-03-14 DIAGNOSIS — I10 Essential (primary) hypertension: Secondary | ICD-10-CM | POA: Diagnosis not present

## 2020-03-14 DIAGNOSIS — R197 Diarrhea, unspecified: Secondary | ICD-10-CM | POA: Insufficient documentation

## 2020-03-14 DIAGNOSIS — K219 Gastro-esophageal reflux disease without esophagitis: Secondary | ICD-10-CM | POA: Diagnosis not present

## 2020-03-14 DIAGNOSIS — J45909 Unspecified asthma, uncomplicated: Secondary | ICD-10-CM | POA: Insufficient documentation

## 2020-03-14 DIAGNOSIS — R1032 Left lower quadrant pain: Secondary | ICD-10-CM | POA: Diagnosis not present

## 2020-03-14 MED ORDER — IOHEXOL 300 MG/ML  SOLN
100.0000 mL | Freq: Once | INTRAMUSCULAR | Status: AC | PRN
  Administered 2020-03-14: 100 mL via INTRAVENOUS

## 2020-03-14 NOTE — Discharge Instructions (Addendum)
Your CT scan is reassuring today.  There is no evidence of a diverticulitis, bowel obstruction, or other concerning finding to attribute to your symptoms today. It was recommended you eat a well-balanced diet.  Please follow closely with GI specialist, you would benefit from colonoscopy for further evaluation of your intermittent symptoms. It appears you saw Dr. Paulita Fujita with [facility] many years back. Schedule a close follow-up appoint with your primary care provider to follow on your visit today. Return to the emergency department if you develop severely worsening pain, high fever, uncontrollable vomiting.

## 2020-03-14 NOTE — ED Provider Notes (Signed)
[facility] EMERGENCY DEPARTMENT Provider Note   CSN: [id number] Arrival date & time: 03/14/20  2778     History Chief Complaint  Patient presents with  . Abdominal Pain    Jesus Brown is a 54 y.o. male w PMHx GERD, HTN, presenting to the ED with complaint of left lower quadrant abdominal pain over the last 1 week.  States he was evaluated by his PCP yesterday who did plain film of his abdomen which was concerning for diverticulitis.  He states his symptoms have been coming and going over the last week however he has had recurrent issues over multiple months including changes in his bowel movements from soft to loose to hard.  His symptoms worsened this week with left lower quadrant pain, nausea, and frequent fluctuations in his bowel movements this week as well.  He is denying documented fever, vomiting, urinary symptoms.  He checked in yesterday evening, however left due to wait time with laboratory work-up done.  He had a CBC, CMP, UA which were all unremarkable for acute significant change.  He had no leukocytosis, AKI, or transaminitis.  UA was also negative.  No history of abdominal surgery.  No recent travel or drinking from questionable water sources.  He states about 2 weeks ago he took a 5-day course of amoxicillin.  No history of C diff.   The history is provided by the patient and medical records.       Past Medical History:  Diagnosis Date  . Anxiety    mainly r/t pain  . Arthritis    neck, knees  . Asthma    AS CHILD   . Back pain, chronic   . DDD (degenerative disc disease)   . Dysphagia   . 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 GERD (gastroesophageal reflux disease)   . Hypertension   . Hypotestosteronemia 09/08/2015  . Neck pain, chronic     Patient Active Problem List   Diagnosis Date Noted  . Retained orthopedic hardware 12/19/2017    Class: Chronic  . Closed fracture of right ankle   . Cellulitis of right leg  05/03/2017  . Hypokalemia 05/03/2017  . Hypertension 05/03/2017  . GERD (gastroesophageal reflux disease) 05/03/2017  . Anxiety 05/03/2017  . S/P total knee replacement using cement, right 04/11/2017  . Status post total left knee replacement 09/26/2016  . Presence of left artificial knee joint 07/25/2016  . Pain and swelling of left lower leg 07/25/2016  . Bilateral primary osteoarthritis of knee 07/19/2016    Class: Chronic  . Unilateral primary osteoarthritis, right knee 07/19/2016  . 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 Hypotestosteronemia 09/08/2015  . Cervical spondylosis without myelopathy 03/25/2014    Class: Chronic  . Left cervical radiculopathy 03/25/2014    Class: Chronic  . Cervical spondylosis with radiculopathy 03/25/2014    Past Surgical History:  Procedure Laterality Date  . ANKLE SURGERY    . BACK SURGERY  2010,2011   lumb fusionx2  . CERVICAL FUSION  2423,5361   3 surgeries  . DIRECT LARYNGOSCOPY N/A 11/09/2012   Procedure: DIRECT LARYNGOSCOPY;  Surgeon: Jodi Marble, MD;  Location: [place];  Service: ENT;  Laterality: N/A;  . ESOPHAGOGASTRODUODENOSCOPY N/A 08/03/2012   Procedure: ESOPHAGOGASTRODUODENOSCOPY (EGD);  Surgeon: Arta Silence, MD;  Location: [facility] ENDOSCOPY;  Service: Endoscopy;  Laterality: N/A;  . ESOPHAGOSCOPY W/ BOTOX INJECTION N/A 11/09/2012   Procedure: ESOPHAGOSCOPY POSSIBLE BIOPSY AND POSSIBLE BOTOX INJECTION CRICOPHARYNGEUS;  Surgeon: Ileene Hutchinson  Erik Obey, MD;  Location: [facility];  Service: ENT;  Laterality: N/A;  Esophagoscopy with botox injection  . HARDWARE REMOVAL Right 12/19/2017   Procedure: REMOVAL OF SMALL FRAGMENT PLATE AND SCREWS RIGHT LATERAL ANKLE;  Surgeon: Jessy Oto, MD;  Location: [place];  Service: Orthopedics;  Laterality: Right;  . HERNIA REPAIR     ing-rt  . KNEE SURGERY     rt and left x2 each=4  . POSTERIOR CERVICAL FUSION/FORAMINOTOMY N/A 03/25/2014    Procedure: LEFT C7-T1 FORAMINOTOMY;  Surgeon: Jessy Oto, MD;  Location: [place];  Service: Orthopedics;  Laterality: N/A;  . [place]   rt and lt  . TONSILLECTOMY    . TOTAL KNEE ARTHROPLASTY Left 07/19/2016   Procedure: LEFT TOTAL KNEE ARTHROPLASTY CORTISON INJECTION IN RIGHT KNEE;  Surgeon: Jessy Oto, MD;  Location: [place];  Service: Orthopedics;  Laterality: Left;  MAY NEED RNFA TO FINISH CASE PER [place]   . TOTAL KNEE ARTHROPLASTY Right 04/11/2017   Procedure: RIGHT TOTAL KNEE ARTHROPLASTY;  Surgeon: Jessy Oto, MD;  Location: [facility];  Service: Orthopedics;  Laterality: Right;  . WRIST SURGERY     lt and rt       Family History  Problem Relation Age of Onset  . Hypertension Mother   . Heart attack Father     Social History   Tobacco Use  . Smoking status: Never Smoker  . Smokeless tobacco: Never Used  Vaping Use  . Vaping Use: Never used  Substance Use Topics  . Alcohol use: No  . Drug use: No    Home Medications Prior to Admission medications   Medication Sig Start Date End Date Taking? Authorizing Provider  allopurinol (ZYLOPRIM) 100 MG tablet Take 1 tablet (100 mg total) by mouth daily. 12/02/19   Wallene Huh, DPM  ANDROGEL PUMP 20.25 MG/ACT (1.62%) GEL  09/13/17   [provider]  Aspirin-Acetaminophen-Caffeine (GOODY HEADACHE PO) Take 1 packet by mouth daily.     [provider]  doxycycline (VIBRA-TABS) 100 MG tablet Take 1 tablet (100 mg total) by mouth 2 (two) times daily. 12/19/17   Jessy Oto, MD  EPINEPHrine (EPIPEN 2-PAK) 0.3 mg/0.3 mL IJ SOAJ injection Inject 0.3 mg into the muscle as needed (allergic reaction).    [provider]  hydrochlorothiazide (HYDRODIURIL) 25 MG tablet Take 25 mg by mouth daily.  08/17/15   [provider]  loratadine (CLARITIN) 10 MG tablet Take 10 mg by mouth daily.    [provider]  morphine (AVINZA) 60 MG 24 hr capsule Take 45 mg by mouth daily.  04/06/17    [provider]  morphine (MSIR) 15 MG tablet Take 15 mg by mouth 2 (two) times daily as needed. 11/17/19   [provider]  Morphine Sulfate Beads 45 MG CP24 Take 1 capsule by mouth daily. 01/06/20   [provider]  omeprazole (PRILOSEC) 20 MG capsule Take 20 mg by mouth daily.     [provider]  PRESCRIPTION MEDICATION Inject 2 Doses as directed once a week. Allergy shots    [provider]  Testosterone (ANDROGEL) 20.25 MG/1.25GM (1.62%) GEL Place 2 Pump onto the skin daily.     [provider]  valsartan (DIOVAN) 160 MG tablet Take 160 mg by mouth daily.  08/17/15   [provider]    Allergies    Adhesive [tape], Baclofen, and Indocin [indomethacin]  Review of Systems  Review of Systems  Gastrointestinal: Positive for abdominal pain, diarrhea and nausea.  All other systems reviewed and are negative.   Physical Exam Updated Vital Signs BP 130/80 (BP Location: Right Arm)   Pulse 88   Temp 98.5 F (36.9 C) (Oral)   Resp 16   SpO2 99%   Physical Exam Vitals and nursing note reviewed.  Constitutional:      General: He is not in acute distress.    Appearance: He is well-developed. He is not ill-appearing.  HENT:     Head: Normocephalic and atraumatic.  Eyes:     Conjunctiva/sclera: Conjunctivae normal.  Cardiovascular:     Rate and Rhythm: Normal rate and regular rhythm.  Pulmonary:     Effort: Pulmonary effort is normal. No respiratory distress.     Breath sounds: Normal breath sounds.  Abdominal:     Palpations: Abdomen is soft.     Tenderness: There is abdominal tenderness in the left lower quadrant. There is no guarding or rebound.  Skin:    General: Skin is warm.  Neurological:     Mental Status: He is alert.  Psychiatric:        Behavior: Behavior normal.     ED Results / Procedures / Treatments   Labs (all labs ordered are listed, but only abnormal results are displayed) Labs Reviewed - No  data to display Results for orders placed or performed during the hospital encounter of 03/13/20  Lipase, blood  Result Value Ref Range   Lipase 24 11 - 51 U/L  Comprehensive metabolic panel  Result Value Ref Range   Sodium 137 135 - 145 mmol/L   Potassium 3.7 3.5 - 5.1 mmol/L   Chloride 103 98 - 111 mmol/L   CO2 22 22 - 32 mmol/L   Glucose, Bld 100 (H) 70 - 99 mg/dL   BUN 17 6 - 20 mg/dL   Creatinine, Ser 0.93 0.61 - 1.24 mg/dL   Calcium 9.2 8.9 - 10.3 mg/dL   Total Protein 6.8 6.5 - 8.1 g/dL   Albumin 3.7 3.5 - 5.0 g/dL   AST 36 15 - 41 U/L   ALT 40 0 - 44 U/L   Alkaline Phosphatase 75 38 - 126 U/L   Total Bilirubin 0.6 0.3 - 1.2 mg/dL   GFR, Estimated >60 >60 mL/min   Anion gap 12 5 - 15  CBC  Result Value Ref Range   WBC 8.5 4.0 - 10.5 K/uL   RBC 5.92 (H) 4.22 - 5.81 MIL/uL   Hemoglobin 12.0 (L) 13.0 - 17.0 g/dL   HCT 42.9 39 - 52 %   MCV 72.5 (L) 80.0 - 100.0 fL   MCH 20.3 (L) 26.0 - 34.0 pg   MCHC 28.0 (L) 30.0 - 36.0 g/dL   RDW 19.2 (H) 11.5 - 15.5 %   Platelets 301 150 - 400 K/uL   nRBC 0.0 0.0 - 0.2 %  Urinalysis, Routine w reflex microscopic Urine, Clean Catch  Result Value Ref Range   Color, Urine YELLOW YELLOW   APPearance CLEAR CLEAR   Specific Gravity, Urine 1.021 1.005 - 1.030   pH 6.0 5.0 - 8.0   Glucose, UA NEGATIVE NEGATIVE mg/dL   Hgb urine dipstick NEGATIVE NEGATIVE   Bilirubin Urine NEGATIVE NEGATIVE   Ketones, ur NEGATIVE NEGATIVE mg/dL   Protein, ur NEGATIVE NEGATIVE mg/dL   Nitrite NEGATIVE NEGATIVE   Leukocytes,Ua NEGATIVE NEGATIVE      EKG None  Radiology CT ABDOMEN PELVIS W CONTRAST  Result Date: 03/14/2020 CLINICAL DATA:  Abdominal pain EXAM: CT ABDOMEN AND PELVIS WITH CONTRAST TECHNIQUE: Multidetector CT imaging of the abdomen and pelvis was performed using the standard protocol following bolus administration of intravenous contrast. CONTRAST:  176mL OMNIPAQUE IOHEXOL 300 MG/ML  SOLN COMPARISON:  None. FINDINGS: Lower chest: No  acute abnormality. Hepatobiliary: No focal liver abnormality is seen. No gallstones, gallbladder wall thickening, or biliary dilatation. Pancreas: Unremarkable. Spleen: Unremarkable. Adrenals/Urinary Tract: Cyst of the lower pole of the left kidney. Adrenals are unremarkable. Bladder is poorly distended. Stomach/Bowel: Stomach is within normal limits. Bowel is normal in caliber. There is distal colonic diverticulosis without evidence of diverticulitis. Appendix is not visualized but there are no secondary signs of acute appendicitis. Vascular/Lymphatic: Aortic atherosclerosis. No enlarged lymph nodes identified. Reproductive: Unremarkable. Other: No ascites.  No acute abnormality of the abdominal wall. Musculoskeletal: Postoperative changes at L3-L5. Degenerative changes of the included spine. IMPRESSION: No acute abnormality. Distal colonic diverticulosis without evidence of diverticulitis. Aortic atherosclerosis. Electronically Signed   By: Macy Mis M.D.   On: 03/14/2020 11:14    Procedures Procedures (including critical care time)  Medications Ordered in ED Medications  iohexol (OMNIPAQUE) 300 MG/ML solution 100 mL (100 mLs Intravenous Contrast Given 03/14/20 1054)    ED Course  I have reviewed the triage vital signs and the nursing notes.  Pertinent labs & imaging results that were available during my care of the patient were reviewed by me and considered in my medical decision making (see chart for details).    MDM Rules/Calculators/A&P                          Patient presenting for evaluation of left lower quadrant abdominal pain nausea, intermittent diarrhea for past 1 week.  PCP sent in for evaluation of diverticulitis.  He had laboratory work-up done yesterday in triage, however patient left due to wait time and presents back today for evaluation.  His laboratory work-up yesterday was reassuring with normal white count, normal renal hepatic function, normal lipase, UA is negative.   On exam today he is well-appearing and in no distress.  Abdomen is some tenderness in the left lower quadrant though no peritoneal signs.  CT scan is also negative for acute findings.  It shows diverticulosis without evidence of diverticulitis, otherwise unremarkable.  Given patient's intermittent recurrence of symptoms chronically, with frequent changes in bowel movements, consider other chronic inflammatory or irritable bowel syndrome.  Patient states it has been many years since he has had colonoscopy, per chart review patient had endoscopy by Dr. Paulita Fujita many years ago.  Patient is referred to [facility] for follow-up, recommend he have follow-up colonoscopy/endoscopy for further evaluation.  Symptoms are not consistent with C. Difficile, without active loose stools today.  Patient discussed with attending.  Will discharge with symptomatic management and follow closely outpatient.  Patient does not require antibiotics at this time. Patient agreeable with plan, appropriate for discharge.  Discussed results, findings, treatment and follow up. Patient advised of return precautions. Patient verbalized understanding and agreed with plan.   Final Clinical Impression(s) / ED Diagnoses Final diagnoses:  Left lower quadrant abdominal pain    Rx / DC Orders ED Discharge Orders    None       Deja Kaigler, [place] N, PA-C 03/14/20 1454    Noemi Chapel, MD 03/15/20 [id number]

## 2020-03-14 NOTE — ED Triage Notes (Signed)
Pt arrives back to ED, he came yesterday after having an abnormal xray at his PCP he was sent to r/o diverticulitis. He is having severe LLQ pain. He has been having pain, nausea and diarrhea for over 1 week.  Reports low grade fevers.  Please see chart for CBC, CMB and urine that was collected yesterday afternoon.

## 2020-03-15 ENCOUNTER — Other Ambulatory Visit: Payer: Self-pay | Admitting: Physical Medicine and Rehabilitation

## 2020-03-15 DIAGNOSIS — M5416 Radiculopathy, lumbar region: Secondary | ICD-10-CM

## 2020-03-16 DIAGNOSIS — D500 Iron deficiency anemia secondary to blood loss (chronic): Secondary | ICD-10-CM | POA: Diagnosis not present

## 2020-03-16 DIAGNOSIS — R1032 Left lower quadrant pain: Secondary | ICD-10-CM | POA: Diagnosis not present

## 2020-03-16 DIAGNOSIS — K5790 Diverticulosis of intestine, part unspecified, without perforation or abscess without bleeding: Secondary | ICD-10-CM | POA: Diagnosis not present

## 2020-03-20 DIAGNOSIS — Z1211 Encounter for screening for malignant neoplasm of colon: Secondary | ICD-10-CM | POA: Diagnosis not present

## 2020-03-20 DIAGNOSIS — K222 Esophageal obstruction: Secondary | ICD-10-CM | POA: Diagnosis not present

## 2020-03-20 DIAGNOSIS — D509 Iron deficiency anemia, unspecified: Secondary | ICD-10-CM | POA: Diagnosis not present

## 2020-03-23 DIAGNOSIS — Z1211 Encounter for screening for malignant neoplasm of colon: Secondary | ICD-10-CM | POA: Diagnosis not present

## 2020-03-23 DIAGNOSIS — D509 Iron deficiency anemia, unspecified: Secondary | ICD-10-CM | POA: Diagnosis not present

## 2020-03-23 DIAGNOSIS — K635 Polyp of colon: Secondary | ICD-10-CM | POA: Diagnosis not present

## 2020-03-23 DIAGNOSIS — K5730 Diverticulosis of large intestine without perforation or abscess without bleeding: Secondary | ICD-10-CM | POA: Diagnosis not present

## 2020-04-04 DIAGNOSIS — M62830 Muscle spasm of back: Secondary | ICD-10-CM | POA: Diagnosis not present

## 2020-04-04 DIAGNOSIS — G894 Chronic pain syndrome: Secondary | ICD-10-CM | POA: Diagnosis not present

## 2020-04-04 DIAGNOSIS — F419 Anxiety disorder, unspecified: Secondary | ICD-10-CM | POA: Diagnosis not present

## 2020-04-04 DIAGNOSIS — M47812 Spondylosis without myelopathy or radiculopathy, cervical region: Secondary | ICD-10-CM | POA: Diagnosis not present

## 2020-04-19 DIAGNOSIS — I10 Essential (primary) hypertension: Secondary | ICD-10-CM | POA: Diagnosis not present

## 2020-04-19 DIAGNOSIS — R7301 Impaired fasting glucose: Secondary | ICD-10-CM | POA: Diagnosis not present

## 2020-04-19 DIAGNOSIS — E291 Testicular hypofunction: Secondary | ICD-10-CM | POA: Diagnosis not present

## 2020-04-19 DIAGNOSIS — E669 Obesity, unspecified: Secondary | ICD-10-CM | POA: Diagnosis not present

## 2020-04-25 DIAGNOSIS — J3081 Allergic rhinitis due to animal (cat) (dog) hair and dander: Secondary | ICD-10-CM | POA: Diagnosis not present

## 2020-04-25 DIAGNOSIS — J3089 Other allergic rhinitis: Secondary | ICD-10-CM | POA: Diagnosis not present

## 2020-04-25 DIAGNOSIS — J301 Allergic rhinitis due to pollen: Secondary | ICD-10-CM | POA: Diagnosis not present

## 2020-05-03 DIAGNOSIS — F419 Anxiety disorder, unspecified: Secondary | ICD-10-CM | POA: Diagnosis not present

## 2020-05-03 DIAGNOSIS — M47812 Spondylosis without myelopathy or radiculopathy, cervical region: Secondary | ICD-10-CM | POA: Diagnosis not present

## 2020-05-03 DIAGNOSIS — M62830 Muscle spasm of back: Secondary | ICD-10-CM | POA: Diagnosis not present

## 2020-05-03 DIAGNOSIS — G894 Chronic pain syndrome: Secondary | ICD-10-CM | POA: Diagnosis not present

## 2020-05-10 DIAGNOSIS — J301 Allergic rhinitis due to pollen: Secondary | ICD-10-CM | POA: Diagnosis not present

## 2020-05-10 DIAGNOSIS — J3089 Other allergic rhinitis: Secondary | ICD-10-CM | POA: Diagnosis not present

## 2020-05-10 DIAGNOSIS — J3081 Allergic rhinitis due to animal (cat) (dog) hair and dander: Secondary | ICD-10-CM | POA: Diagnosis not present

## 2020-05-11 DIAGNOSIS — R7301 Impaired fasting glucose: Secondary | ICD-10-CM | POA: Diagnosis not present

## 2020-05-11 DIAGNOSIS — E291 Testicular hypofunction: Secondary | ICD-10-CM | POA: Diagnosis not present

## 2020-05-11 DIAGNOSIS — I10 Essential (primary) hypertension: Secondary | ICD-10-CM | POA: Diagnosis not present

## 2020-05-11 DIAGNOSIS — D509 Iron deficiency anemia, unspecified: Secondary | ICD-10-CM | POA: Diagnosis not present

## 2020-05-15 DIAGNOSIS — J301 Allergic rhinitis due to pollen: Secondary | ICD-10-CM | POA: Diagnosis not present

## 2020-05-15 DIAGNOSIS — J3081 Allergic rhinitis due to animal (cat) (dog) hair and dander: Secondary | ICD-10-CM | POA: Diagnosis not present

## 2020-05-15 DIAGNOSIS — J3089 Other allergic rhinitis: Secondary | ICD-10-CM | POA: Diagnosis not present

## 2020-05-23 DIAGNOSIS — J3081 Allergic rhinitis due to animal (cat) (dog) hair and dander: Secondary | ICD-10-CM | POA: Diagnosis not present

## 2020-05-23 DIAGNOSIS — J301 Allergic rhinitis due to pollen: Secondary | ICD-10-CM | POA: Diagnosis not present

## 2020-05-23 DIAGNOSIS — J3089 Other allergic rhinitis: Secondary | ICD-10-CM | POA: Diagnosis not present

## 2020-05-24 ENCOUNTER — Other Ambulatory Visit: Payer: Self-pay | Admitting: Podiatry

## 2020-05-24 NOTE — Telephone Encounter (Signed)
Please advise 

## 2020-05-25 DIAGNOSIS — J301 Allergic rhinitis due to pollen: Secondary | ICD-10-CM | POA: Diagnosis not present

## 2020-05-25 DIAGNOSIS — J3089 Other allergic rhinitis: Secondary | ICD-10-CM | POA: Diagnosis not present

## 2020-05-25 DIAGNOSIS — J3081 Allergic rhinitis due to animal (cat) (dog) hair and dander: Secondary | ICD-10-CM | POA: Diagnosis not present

## 2020-05-29 DIAGNOSIS — J3089 Other allergic rhinitis: Secondary | ICD-10-CM | POA: Diagnosis not present

## 2020-05-29 DIAGNOSIS — J301 Allergic rhinitis due to pollen: Secondary | ICD-10-CM | POA: Diagnosis not present

## 2020-05-29 DIAGNOSIS — J3081 Allergic rhinitis due to animal (cat) (dog) hair and dander: Secondary | ICD-10-CM | POA: Diagnosis not present

## 2020-05-31 DIAGNOSIS — M62830 Muscle spasm of back: Secondary | ICD-10-CM | POA: Diagnosis not present

## 2020-05-31 DIAGNOSIS — M47812 Spondylosis without myelopathy or radiculopathy, cervical region: Secondary | ICD-10-CM | POA: Diagnosis not present

## 2020-05-31 DIAGNOSIS — F419 Anxiety disorder, unspecified: Secondary | ICD-10-CM | POA: Diagnosis not present

## 2020-05-31 DIAGNOSIS — G894 Chronic pain syndrome: Secondary | ICD-10-CM | POA: Diagnosis not present

## 2020-06-02 DIAGNOSIS — J301 Allergic rhinitis due to pollen: Secondary | ICD-10-CM | POA: Diagnosis not present

## 2020-06-02 DIAGNOSIS — J3089 Other allergic rhinitis: Secondary | ICD-10-CM | POA: Diagnosis not present

## 2020-06-02 DIAGNOSIS — J3081 Allergic rhinitis due to animal (cat) (dog) hair and dander: Secondary | ICD-10-CM | POA: Diagnosis not present

## 2020-06-14 DIAGNOSIS — J301 Allergic rhinitis due to pollen: Secondary | ICD-10-CM | POA: Diagnosis not present

## 2020-06-14 DIAGNOSIS — J3081 Allergic rhinitis due to animal (cat) (dog) hair and dander: Secondary | ICD-10-CM | POA: Diagnosis not present

## 2020-06-14 DIAGNOSIS — J3089 Other allergic rhinitis: Secondary | ICD-10-CM | POA: Diagnosis not present

## 2020-06-16 DIAGNOSIS — J3089 Other allergic rhinitis: Secondary | ICD-10-CM | POA: Diagnosis not present

## 2020-06-16 DIAGNOSIS — J3081 Allergic rhinitis due to animal (cat) (dog) hair and dander: Secondary | ICD-10-CM | POA: Diagnosis not present

## 2020-06-16 DIAGNOSIS — J301 Allergic rhinitis due to pollen: Secondary | ICD-10-CM | POA: Diagnosis not present

## 2020-06-19 DIAGNOSIS — J301 Allergic rhinitis due to pollen: Secondary | ICD-10-CM | POA: Diagnosis not present

## 2020-06-19 DIAGNOSIS — J3081 Allergic rhinitis due to animal (cat) (dog) hair and dander: Secondary | ICD-10-CM | POA: Diagnosis not present

## 2020-06-19 DIAGNOSIS — J3089 Other allergic rhinitis: Secondary | ICD-10-CM | POA: Diagnosis not present

## 2020-06-28 DIAGNOSIS — M47812 Spondylosis without myelopathy or radiculopathy, cervical region: Secondary | ICD-10-CM | POA: Diagnosis not present

## 2020-06-28 DIAGNOSIS — M62830 Muscle spasm of back: Secondary | ICD-10-CM | POA: Diagnosis not present

## 2020-06-28 DIAGNOSIS — F419 Anxiety disorder, unspecified: Secondary | ICD-10-CM | POA: Diagnosis not present

## 2020-06-28 DIAGNOSIS — G894 Chronic pain syndrome: Secondary | ICD-10-CM | POA: Diagnosis not present

## 2020-07-03 DIAGNOSIS — D2262 Melanocytic nevi of left upper limb, including shoulder: Secondary | ICD-10-CM | POA: Diagnosis not present

## 2020-07-03 DIAGNOSIS — D225 Melanocytic nevi of trunk: Secondary | ICD-10-CM | POA: Diagnosis not present

## 2020-07-03 DIAGNOSIS — Z1283 Encounter for screening for malignant neoplasm of skin: Secondary | ICD-10-CM | POA: Diagnosis not present

## 2020-07-03 DIAGNOSIS — D485 Neoplasm of uncertain behavior of skin: Secondary | ICD-10-CM | POA: Diagnosis not present

## 2020-07-03 DIAGNOSIS — L821 Other seborrheic keratosis: Secondary | ICD-10-CM | POA: Diagnosis not present

## 2020-07-04 DIAGNOSIS — J301 Allergic rhinitis due to pollen: Secondary | ICD-10-CM | POA: Diagnosis not present

## 2020-07-04 DIAGNOSIS — J3081 Allergic rhinitis due to animal (cat) (dog) hair and dander: Secondary | ICD-10-CM | POA: Diagnosis not present

## 2020-07-04 DIAGNOSIS — J3089 Other allergic rhinitis: Secondary | ICD-10-CM | POA: Diagnosis not present

## 2020-07-06 DIAGNOSIS — J3081 Allergic rhinitis due to animal (cat) (dog) hair and dander: Secondary | ICD-10-CM | POA: Diagnosis not present

## 2020-07-06 DIAGNOSIS — J301 Allergic rhinitis due to pollen: Secondary | ICD-10-CM | POA: Diagnosis not present

## 2020-07-06 DIAGNOSIS — J3089 Other allergic rhinitis: Secondary | ICD-10-CM | POA: Diagnosis not present

## 2020-07-14 DIAGNOSIS — J3081 Allergic rhinitis due to animal (cat) (dog) hair and dander: Secondary | ICD-10-CM | POA: Diagnosis not present

## 2020-07-14 DIAGNOSIS — J3089 Other allergic rhinitis: Secondary | ICD-10-CM | POA: Diagnosis not present

## 2020-07-14 DIAGNOSIS — J301 Allergic rhinitis due to pollen: Secondary | ICD-10-CM | POA: Diagnosis not present

## 2020-07-20 DIAGNOSIS — J301 Allergic rhinitis due to pollen: Secondary | ICD-10-CM | POA: Diagnosis not present

## 2020-07-20 DIAGNOSIS — J3081 Allergic rhinitis due to animal (cat) (dog) hair and dander: Secondary | ICD-10-CM | POA: Diagnosis not present

## 2020-07-20 DIAGNOSIS — J3089 Other allergic rhinitis: Secondary | ICD-10-CM | POA: Diagnosis not present

## 2020-07-31 DIAGNOSIS — M62830 Muscle spasm of back: Secondary | ICD-10-CM | POA: Diagnosis not present

## 2020-07-31 DIAGNOSIS — M47812 Spondylosis without myelopathy or radiculopathy, cervical region: Secondary | ICD-10-CM | POA: Diagnosis not present

## 2020-07-31 DIAGNOSIS — G894 Chronic pain syndrome: Secondary | ICD-10-CM | POA: Diagnosis not present

## 2020-07-31 DIAGNOSIS — F419 Anxiety disorder, unspecified: Secondary | ICD-10-CM | POA: Diagnosis not present

## 2020-08-03 DIAGNOSIS — D485 Neoplasm of uncertain behavior of skin: Secondary | ICD-10-CM | POA: Diagnosis not present

## 2020-08-03 DIAGNOSIS — L988 Other specified disorders of the skin and subcutaneous tissue: Secondary | ICD-10-CM | POA: Diagnosis not present

## 2020-08-04 DIAGNOSIS — J301 Allergic rhinitis due to pollen: Secondary | ICD-10-CM | POA: Diagnosis not present

## 2020-08-04 DIAGNOSIS — J3089 Other allergic rhinitis: Secondary | ICD-10-CM | POA: Diagnosis not present

## 2020-08-04 DIAGNOSIS — J3081 Allergic rhinitis due to animal (cat) (dog) hair and dander: Secondary | ICD-10-CM | POA: Diagnosis not present

## 2020-08-11 DIAGNOSIS — J3081 Allergic rhinitis due to animal (cat) (dog) hair and dander: Secondary | ICD-10-CM | POA: Diagnosis not present

## 2020-08-11 DIAGNOSIS — J3089 Other allergic rhinitis: Secondary | ICD-10-CM | POA: Diagnosis not present

## 2020-08-11 DIAGNOSIS — J301 Allergic rhinitis due to pollen: Secondary | ICD-10-CM | POA: Diagnosis not present

## 2020-08-18 DIAGNOSIS — J3089 Other allergic rhinitis: Secondary | ICD-10-CM | POA: Diagnosis not present

## 2020-08-18 DIAGNOSIS — J3081 Allergic rhinitis due to animal (cat) (dog) hair and dander: Secondary | ICD-10-CM | POA: Diagnosis not present

## 2020-08-18 DIAGNOSIS — J301 Allergic rhinitis due to pollen: Secondary | ICD-10-CM | POA: Diagnosis not present

## 2020-08-25 DIAGNOSIS — J3089 Other allergic rhinitis: Secondary | ICD-10-CM | POA: Diagnosis not present

## 2020-08-25 DIAGNOSIS — J301 Allergic rhinitis due to pollen: Secondary | ICD-10-CM | POA: Diagnosis not present

## 2020-08-25 DIAGNOSIS — J3081 Allergic rhinitis due to animal (cat) (dog) hair and dander: Secondary | ICD-10-CM | POA: Diagnosis not present

## 2020-09-15 DIAGNOSIS — J3081 Allergic rhinitis due to animal (cat) (dog) hair and dander: Secondary | ICD-10-CM | POA: Diagnosis not present

## 2020-09-15 DIAGNOSIS — J3089 Other allergic rhinitis: Secondary | ICD-10-CM | POA: Diagnosis not present

## 2020-09-15 DIAGNOSIS — J301 Allergic rhinitis due to pollen: Secondary | ICD-10-CM | POA: Diagnosis not present

## 2020-09-18 DIAGNOSIS — G894 Chronic pain syndrome: Secondary | ICD-10-CM | POA: Diagnosis not present

## 2020-09-18 DIAGNOSIS — M47812 Spondylosis without myelopathy or radiculopathy, cervical region: Secondary | ICD-10-CM | POA: Diagnosis not present

## 2020-09-18 DIAGNOSIS — M62830 Muscle spasm of back: Secondary | ICD-10-CM | POA: Diagnosis not present

## 2020-09-18 DIAGNOSIS — F419 Anxiety disorder, unspecified: Secondary | ICD-10-CM | POA: Diagnosis not present

## 2020-09-18 DIAGNOSIS — J3081 Allergic rhinitis due to animal (cat) (dog) hair and dander: Secondary | ICD-10-CM | POA: Diagnosis not present

## 2020-09-18 DIAGNOSIS — J3089 Other allergic rhinitis: Secondary | ICD-10-CM | POA: Diagnosis not present

## 2020-09-18 DIAGNOSIS — J301 Allergic rhinitis due to pollen: Secondary | ICD-10-CM | POA: Diagnosis not present

## 2020-09-19 ENCOUNTER — Other Ambulatory Visit: Payer: Self-pay | Admitting: Physical Medicine and Rehabilitation

## 2020-09-19 DIAGNOSIS — M5416 Radiculopathy, lumbar region: Secondary | ICD-10-CM

## 2020-09-20 DIAGNOSIS — J3089 Other allergic rhinitis: Secondary | ICD-10-CM | POA: Diagnosis not present

## 2020-09-20 DIAGNOSIS — J3081 Allergic rhinitis due to animal (cat) (dog) hair and dander: Secondary | ICD-10-CM | POA: Diagnosis not present

## 2020-09-20 DIAGNOSIS — J301 Allergic rhinitis due to pollen: Secondary | ICD-10-CM | POA: Diagnosis not present

## 2020-09-25 DIAGNOSIS — M5481 Occipital neuralgia: Secondary | ICD-10-CM | POA: Diagnosis not present

## 2020-10-06 DIAGNOSIS — J301 Allergic rhinitis due to pollen: Secondary | ICD-10-CM | POA: Diagnosis not present

## 2020-10-06 DIAGNOSIS — J3081 Allergic rhinitis due to animal (cat) (dog) hair and dander: Secondary | ICD-10-CM | POA: Diagnosis not present

## 2020-10-06 DIAGNOSIS — J3089 Other allergic rhinitis: Secondary | ICD-10-CM | POA: Diagnosis not present

## 2020-10-11 ENCOUNTER — Ambulatory Visit
Admission: RE | Admit: 2020-10-11 | Discharge: 2020-10-11 | Disposition: A | Discharge status: Home / Self Care | Payer: PPO | Source: Ambulatory Visit | Attending: Physical Medicine and Rehabilitation | Admitting: Physical Medicine and Rehabilitation

## 2020-10-11 DIAGNOSIS — R200 Anesthesia of skin: Secondary | ICD-10-CM | POA: Diagnosis not present

## 2020-10-11 DIAGNOSIS — R531 Weakness: Secondary | ICD-10-CM | POA: Diagnosis not present

## 2020-10-11 DIAGNOSIS — M5136 Other intervertebral disc degeneration, lumbar region: Secondary | ICD-10-CM | POA: Diagnosis not present

## 2020-10-11 DIAGNOSIS — M5416 Radiculopathy, lumbar region: Secondary | ICD-10-CM

## 2020-10-11 DIAGNOSIS — M48061 Spinal stenosis, lumbar region without neurogenic claudication: Secondary | ICD-10-CM | POA: Diagnosis not present

## 2020-10-11 MED ORDER — GADOBENATE DIMEGLUMINE 529 MG/ML IV SOLN
20.0000 mL | Freq: Once | INTRAVENOUS | Status: AC | PRN
  Administered 2020-10-11: 20 mL via INTRAVENOUS

## 2020-10-19 DIAGNOSIS — G894 Chronic pain syndrome: Secondary | ICD-10-CM | POA: Diagnosis not present

## 2020-10-19 DIAGNOSIS — J3089 Other allergic rhinitis: Secondary | ICD-10-CM | POA: Diagnosis not present

## 2020-10-19 DIAGNOSIS — F419 Anxiety disorder, unspecified: Secondary | ICD-10-CM | POA: Diagnosis not present

## 2020-10-19 DIAGNOSIS — M47812 Spondylosis without myelopathy or radiculopathy, cervical region: Secondary | ICD-10-CM | POA: Diagnosis not present

## 2020-10-19 DIAGNOSIS — M62830 Muscle spasm of back: Secondary | ICD-10-CM | POA: Diagnosis not present

## 2020-10-19 DIAGNOSIS — J301 Allergic rhinitis due to pollen: Secondary | ICD-10-CM | POA: Diagnosis not present

## 2020-10-19 DIAGNOSIS — J3081 Allergic rhinitis due to animal (cat) (dog) hair and dander: Secondary | ICD-10-CM | POA: Diagnosis not present

## 2020-11-09 DIAGNOSIS — G894 Chronic pain syndrome: Secondary | ICD-10-CM | POA: Diagnosis not present

## 2020-11-09 DIAGNOSIS — M62830 Muscle spasm of back: Secondary | ICD-10-CM | POA: Diagnosis not present

## 2020-11-09 DIAGNOSIS — Z79891 Long term (current) use of opiate analgesic: Secondary | ICD-10-CM | POA: Diagnosis not present

## 2020-11-09 DIAGNOSIS — M47812 Spondylosis without myelopathy or radiculopathy, cervical region: Secondary | ICD-10-CM | POA: Diagnosis not present

## 2020-11-09 DIAGNOSIS — F419 Anxiety disorder, unspecified: Secondary | ICD-10-CM | POA: Diagnosis not present

## 2020-11-17 DIAGNOSIS — J3081 Allergic rhinitis due to animal (cat) (dog) hair and dander: Secondary | ICD-10-CM | POA: Diagnosis not present

## 2020-11-17 DIAGNOSIS — J3089 Other allergic rhinitis: Secondary | ICD-10-CM | POA: Diagnosis not present

## 2020-11-17 DIAGNOSIS — J301 Allergic rhinitis due to pollen: Secondary | ICD-10-CM | POA: Diagnosis not present

## 2020-11-24 DIAGNOSIS — J3081 Allergic rhinitis due to animal (cat) (dog) hair and dander: Secondary | ICD-10-CM | POA: Diagnosis not present

## 2020-11-24 DIAGNOSIS — J301 Allergic rhinitis due to pollen: Secondary | ICD-10-CM | POA: Diagnosis not present

## 2020-11-24 DIAGNOSIS — J3089 Other allergic rhinitis: Secondary | ICD-10-CM | POA: Diagnosis not present

## 2020-11-28 DIAGNOSIS — J3081 Allergic rhinitis due to animal (cat) (dog) hair and dander: Secondary | ICD-10-CM | POA: Diagnosis not present

## 2020-11-28 DIAGNOSIS — J3089 Other allergic rhinitis: Secondary | ICD-10-CM | POA: Diagnosis not present

## 2020-12-05 DIAGNOSIS — F419 Anxiety disorder, unspecified: Secondary | ICD-10-CM | POA: Diagnosis not present

## 2020-12-05 DIAGNOSIS — M62830 Muscle spasm of back: Secondary | ICD-10-CM | POA: Diagnosis not present

## 2020-12-05 DIAGNOSIS — M47812 Spondylosis without myelopathy or radiculopathy, cervical region: Secondary | ICD-10-CM | POA: Diagnosis not present

## 2020-12-05 DIAGNOSIS — G894 Chronic pain syndrome: Secondary | ICD-10-CM | POA: Diagnosis not present

## 2021-01-04 DIAGNOSIS — J3081 Allergic rhinitis due to animal (cat) (dog) hair and dander: Secondary | ICD-10-CM | POA: Diagnosis not present

## 2021-01-04 DIAGNOSIS — M62830 Muscle spasm of back: Secondary | ICD-10-CM | POA: Diagnosis not present

## 2021-01-04 DIAGNOSIS — J3089 Other allergic rhinitis: Secondary | ICD-10-CM | POA: Diagnosis not present

## 2021-01-04 DIAGNOSIS — M47812 Spondylosis without myelopathy or radiculopathy, cervical region: Secondary | ICD-10-CM | POA: Diagnosis not present

## 2021-01-04 DIAGNOSIS — G894 Chronic pain syndrome: Secondary | ICD-10-CM | POA: Diagnosis not present

## 2021-01-04 DIAGNOSIS — F419 Anxiety disorder, unspecified: Secondary | ICD-10-CM | POA: Diagnosis not present

## 2021-01-04 DIAGNOSIS — J301 Allergic rhinitis due to pollen: Secondary | ICD-10-CM | POA: Diagnosis not present

## 2021-01-11 DIAGNOSIS — D1801 Hemangioma of skin and subcutaneous tissue: Secondary | ICD-10-CM | POA: Diagnosis not present

## 2021-01-11 DIAGNOSIS — D1809 Hemangioma of other sites: Secondary | ICD-10-CM | POA: Diagnosis not present

## 2021-01-11 DIAGNOSIS — Z1283 Encounter for screening for malignant neoplasm of skin: Secondary | ICD-10-CM | POA: Diagnosis not present

## 2021-01-23 DIAGNOSIS — J3089 Other allergic rhinitis: Secondary | ICD-10-CM | POA: Diagnosis not present

## 2021-01-23 DIAGNOSIS — J301 Allergic rhinitis due to pollen: Secondary | ICD-10-CM | POA: Diagnosis not present

## 2021-01-23 DIAGNOSIS — J3081 Allergic rhinitis due to animal (cat) (dog) hair and dander: Secondary | ICD-10-CM | POA: Diagnosis not present

## 2021-02-01 DIAGNOSIS — F419 Anxiety disorder, unspecified: Secondary | ICD-10-CM | POA: Diagnosis not present

## 2021-02-01 DIAGNOSIS — G894 Chronic pain syndrome: Secondary | ICD-10-CM | POA: Diagnosis not present

## 2021-02-01 DIAGNOSIS — J3081 Allergic rhinitis due to animal (cat) (dog) hair and dander: Secondary | ICD-10-CM | POA: Diagnosis not present

## 2021-02-01 DIAGNOSIS — M47812 Spondylosis without myelopathy or radiculopathy, cervical region: Secondary | ICD-10-CM | POA: Diagnosis not present

## 2021-02-01 DIAGNOSIS — J301 Allergic rhinitis due to pollen: Secondary | ICD-10-CM | POA: Diagnosis not present

## 2021-02-01 DIAGNOSIS — M62830 Muscle spasm of back: Secondary | ICD-10-CM | POA: Diagnosis not present

## 2021-02-01 DIAGNOSIS — J3089 Other allergic rhinitis: Secondary | ICD-10-CM | POA: Diagnosis not present

## 2021-02-07 DIAGNOSIS — J3081 Allergic rhinitis due to animal (cat) (dog) hair and dander: Secondary | ICD-10-CM | POA: Diagnosis not present

## 2021-02-07 DIAGNOSIS — J3089 Other allergic rhinitis: Secondary | ICD-10-CM | POA: Diagnosis not present

## 2021-02-07 DIAGNOSIS — J301 Allergic rhinitis due to pollen: Secondary | ICD-10-CM | POA: Diagnosis not present

## 2021-02-14 DIAGNOSIS — J3081 Allergic rhinitis due to animal (cat) (dog) hair and dander: Secondary | ICD-10-CM | POA: Diagnosis not present

## 2021-02-14 DIAGNOSIS — J3089 Other allergic rhinitis: Secondary | ICD-10-CM | POA: Diagnosis not present

## 2021-02-14 DIAGNOSIS — J301 Allergic rhinitis due to pollen: Secondary | ICD-10-CM | POA: Diagnosis not present

## 2021-02-22 DIAGNOSIS — J3081 Allergic rhinitis due to animal (cat) (dog) hair and dander: Secondary | ICD-10-CM | POA: Diagnosis not present

## 2021-02-22 DIAGNOSIS — J3089 Other allergic rhinitis: Secondary | ICD-10-CM | POA: Diagnosis not present

## 2021-02-22 DIAGNOSIS — J301 Allergic rhinitis due to pollen: Secondary | ICD-10-CM | POA: Diagnosis not present

## 2021-02-27 DIAGNOSIS — J301 Allergic rhinitis due to pollen: Secondary | ICD-10-CM | POA: Diagnosis not present

## 2021-02-27 DIAGNOSIS — J3089 Other allergic rhinitis: Secondary | ICD-10-CM | POA: Diagnosis not present

## 2021-02-27 DIAGNOSIS — J3081 Allergic rhinitis due to animal (cat) (dog) hair and dander: Secondary | ICD-10-CM | POA: Diagnosis not present

## 2021-03-01 ENCOUNTER — Other Ambulatory Visit: Payer: Self-pay | Admitting: Podiatry

## 2021-03-05 NOTE — Telephone Encounter (Signed)
Please advise 

## 2021-03-13 DIAGNOSIS — M47812 Spondylosis without myelopathy or radiculopathy, cervical region: Secondary | ICD-10-CM | POA: Diagnosis not present

## 2021-03-13 DIAGNOSIS — F419 Anxiety disorder, unspecified: Secondary | ICD-10-CM | POA: Diagnosis not present

## 2021-03-13 DIAGNOSIS — G894 Chronic pain syndrome: Secondary | ICD-10-CM | POA: Diagnosis not present

## 2021-03-13 DIAGNOSIS — M62830 Muscle spasm of back: Secondary | ICD-10-CM | POA: Diagnosis not present

## 2021-04-10 DIAGNOSIS — F419 Anxiety disorder, unspecified: Secondary | ICD-10-CM | POA: Diagnosis not present

## 2021-04-10 DIAGNOSIS — M47812 Spondylosis without myelopathy or radiculopathy, cervical region: Secondary | ICD-10-CM | POA: Diagnosis not present

## 2021-04-10 DIAGNOSIS — G894 Chronic pain syndrome: Secondary | ICD-10-CM | POA: Diagnosis not present

## 2021-04-10 DIAGNOSIS — M62830 Muscle spasm of back: Secondary | ICD-10-CM | POA: Diagnosis not present

## 2021-04-16 DIAGNOSIS — Z0000 Encounter for general adult medical examination without abnormal findings: Secondary | ICD-10-CM | POA: Diagnosis not present

## 2021-04-16 DIAGNOSIS — I10 Essential (primary) hypertension: Secondary | ICD-10-CM | POA: Diagnosis not present

## 2021-04-16 DIAGNOSIS — R7301 Impaired fasting glucose: Secondary | ICD-10-CM | POA: Diagnosis not present

## 2021-04-19 DIAGNOSIS — D751 Secondary polycythemia: Secondary | ICD-10-CM | POA: Diagnosis not present

## 2021-04-19 DIAGNOSIS — I10 Essential (primary) hypertension: Secondary | ICD-10-CM | POA: Diagnosis not present

## 2021-04-19 DIAGNOSIS — K219 Gastro-esophageal reflux disease without esophagitis: Secondary | ICD-10-CM | POA: Diagnosis not present

## 2021-04-19 DIAGNOSIS — Z0000 Encounter for general adult medical examination without abnormal findings: Secondary | ICD-10-CM | POA: Diagnosis not present

## 2021-04-19 DIAGNOSIS — R7301 Impaired fasting glucose: Secondary | ICD-10-CM | POA: Diagnosis not present

## 2021-04-19 DIAGNOSIS — E291 Testicular hypofunction: Secondary | ICD-10-CM | POA: Diagnosis not present

## 2021-04-19 DIAGNOSIS — E669 Obesity, unspecified: Secondary | ICD-10-CM | POA: Diagnosis not present

## 2021-05-07 DIAGNOSIS — J301 Allergic rhinitis due to pollen: Secondary | ICD-10-CM | POA: Diagnosis not present

## 2021-05-08 DIAGNOSIS — G894 Chronic pain syndrome: Secondary | ICD-10-CM | POA: Diagnosis not present

## 2021-05-08 DIAGNOSIS — F419 Anxiety disorder, unspecified: Secondary | ICD-10-CM | POA: Diagnosis not present

## 2021-05-08 DIAGNOSIS — M47812 Spondylosis without myelopathy or radiculopathy, cervical region: Secondary | ICD-10-CM | POA: Diagnosis not present

## 2021-05-08 DIAGNOSIS — M62830 Muscle spasm of back: Secondary | ICD-10-CM | POA: Diagnosis not present

## 2021-05-21 DIAGNOSIS — J3089 Other allergic rhinitis: Secondary | ICD-10-CM | POA: Diagnosis not present

## 2021-05-21 DIAGNOSIS — J301 Allergic rhinitis due to pollen: Secondary | ICD-10-CM | POA: Diagnosis not present

## 2021-05-21 DIAGNOSIS — J3081 Allergic rhinitis due to animal (cat) (dog) hair and dander: Secondary | ICD-10-CM | POA: Diagnosis not present

## 2021-06-01 DIAGNOSIS — J3089 Other allergic rhinitis: Secondary | ICD-10-CM | POA: Diagnosis not present

## 2021-06-01 DIAGNOSIS — J301 Allergic rhinitis due to pollen: Secondary | ICD-10-CM | POA: Diagnosis not present

## 2021-06-01 DIAGNOSIS — J3081 Allergic rhinitis due to animal (cat) (dog) hair and dander: Secondary | ICD-10-CM | POA: Diagnosis not present

## 2021-06-05 DIAGNOSIS — M47812 Spondylosis without myelopathy or radiculopathy, cervical region: Secondary | ICD-10-CM | POA: Diagnosis not present

## 2021-06-05 DIAGNOSIS — M62830 Muscle spasm of back: Secondary | ICD-10-CM | POA: Diagnosis not present

## 2021-06-05 DIAGNOSIS — G894 Chronic pain syndrome: Secondary | ICD-10-CM | POA: Diagnosis not present

## 2021-06-05 DIAGNOSIS — F419 Anxiety disorder, unspecified: Secondary | ICD-10-CM | POA: Diagnosis not present

## 2021-06-08 DIAGNOSIS — J301 Allergic rhinitis due to pollen: Secondary | ICD-10-CM | POA: Diagnosis not present

## 2021-06-08 DIAGNOSIS — J3089 Other allergic rhinitis: Secondary | ICD-10-CM | POA: Diagnosis not present

## 2021-06-08 DIAGNOSIS — J3081 Allergic rhinitis due to animal (cat) (dog) hair and dander: Secondary | ICD-10-CM | POA: Diagnosis not present

## 2021-06-27 DIAGNOSIS — J3081 Allergic rhinitis due to animal (cat) (dog) hair and dander: Secondary | ICD-10-CM | POA: Diagnosis not present

## 2021-06-27 DIAGNOSIS — J301 Allergic rhinitis due to pollen: Secondary | ICD-10-CM | POA: Diagnosis not present

## 2021-06-27 DIAGNOSIS — J3089 Other allergic rhinitis: Secondary | ICD-10-CM | POA: Diagnosis not present

## 2021-07-03 DIAGNOSIS — M62830 Muscle spasm of back: Secondary | ICD-10-CM | POA: Diagnosis not present

## 2021-07-03 DIAGNOSIS — M47812 Spondylosis without myelopathy or radiculopathy, cervical region: Secondary | ICD-10-CM | POA: Diagnosis not present

## 2021-07-03 DIAGNOSIS — F419 Anxiety disorder, unspecified: Secondary | ICD-10-CM | POA: Diagnosis not present

## 2021-07-03 DIAGNOSIS — G894 Chronic pain syndrome: Secondary | ICD-10-CM | POA: Diagnosis not present

## 2021-07-12 DIAGNOSIS — Z1283 Encounter for screening for malignant neoplasm of skin: Secondary | ICD-10-CM | POA: Diagnosis not present

## 2021-07-12 DIAGNOSIS — D225 Melanocytic nevi of trunk: Secondary | ICD-10-CM | POA: Diagnosis not present

## 2021-07-23 DIAGNOSIS — J301 Allergic rhinitis due to pollen: Secondary | ICD-10-CM | POA: Diagnosis not present

## 2021-07-23 DIAGNOSIS — J3089 Other allergic rhinitis: Secondary | ICD-10-CM | POA: Diagnosis not present

## 2021-07-23 DIAGNOSIS — J3081 Allergic rhinitis due to animal (cat) (dog) hair and dander: Secondary | ICD-10-CM | POA: Diagnosis not present

## 2021-07-27 DIAGNOSIS — J3081 Allergic rhinitis due to animal (cat) (dog) hair and dander: Secondary | ICD-10-CM | POA: Diagnosis not present

## 2021-07-27 DIAGNOSIS — J3089 Other allergic rhinitis: Secondary | ICD-10-CM | POA: Diagnosis not present

## 2021-07-27 DIAGNOSIS — J301 Allergic rhinitis due to pollen: Secondary | ICD-10-CM | POA: Diagnosis not present

## 2021-07-31 DIAGNOSIS — F419 Anxiety disorder, unspecified: Secondary | ICD-10-CM | POA: Diagnosis not present

## 2021-07-31 DIAGNOSIS — M62830 Muscle spasm of back: Secondary | ICD-10-CM | POA: Diagnosis not present

## 2021-07-31 DIAGNOSIS — M47812 Spondylosis without myelopathy or radiculopathy, cervical region: Secondary | ICD-10-CM | POA: Diagnosis not present

## 2021-07-31 DIAGNOSIS — G894 Chronic pain syndrome: Secondary | ICD-10-CM | POA: Diagnosis not present

## 2021-08-01 DIAGNOSIS — J301 Allergic rhinitis due to pollen: Secondary | ICD-10-CM | POA: Diagnosis not present

## 2021-08-01 DIAGNOSIS — J3089 Other allergic rhinitis: Secondary | ICD-10-CM | POA: Diagnosis not present

## 2021-08-01 DIAGNOSIS — J3081 Allergic rhinitis due to animal (cat) (dog) hair and dander: Secondary | ICD-10-CM | POA: Diagnosis not present

## 2021-08-06 DIAGNOSIS — M5481 Occipital neuralgia: Secondary | ICD-10-CM | POA: Diagnosis not present

## 2021-08-28 DIAGNOSIS — F419 Anxiety disorder, unspecified: Secondary | ICD-10-CM | POA: Diagnosis not present

## 2021-08-28 DIAGNOSIS — G894 Chronic pain syndrome: Secondary | ICD-10-CM | POA: Diagnosis not present

## 2021-08-28 DIAGNOSIS — M62830 Muscle spasm of back: Secondary | ICD-10-CM | POA: Diagnosis not present

## 2021-08-28 DIAGNOSIS — M47812 Spondylosis without myelopathy or radiculopathy, cervical region: Secondary | ICD-10-CM | POA: Diagnosis not present

## 2021-09-26 DIAGNOSIS — Z79891 Long term (current) use of opiate analgesic: Secondary | ICD-10-CM | POA: Diagnosis not present

## 2021-09-26 DIAGNOSIS — M47812 Spondylosis without myelopathy or radiculopathy, cervical region: Secondary | ICD-10-CM | POA: Diagnosis not present

## 2021-09-26 DIAGNOSIS — G894 Chronic pain syndrome: Secondary | ICD-10-CM | POA: Diagnosis not present

## 2021-09-26 DIAGNOSIS — F419 Anxiety disorder, unspecified: Secondary | ICD-10-CM | POA: Diagnosis not present

## 2021-09-26 DIAGNOSIS — M62830 Muscle spasm of back: Secondary | ICD-10-CM | POA: Diagnosis not present

## 2021-10-08 DIAGNOSIS — R011 Cardiac murmur, unspecified: Secondary | ICD-10-CM | POA: Diagnosis not present

## 2021-10-08 DIAGNOSIS — H109 Unspecified conjunctivitis: Secondary | ICD-10-CM | POA: Diagnosis not present

## 2021-10-24 DIAGNOSIS — M47812 Spondylosis without myelopathy or radiculopathy, cervical region: Secondary | ICD-10-CM | POA: Diagnosis not present

## 2021-10-24 DIAGNOSIS — M62830 Muscle spasm of back: Secondary | ICD-10-CM | POA: Diagnosis not present

## 2021-10-24 DIAGNOSIS — G894 Chronic pain syndrome: Secondary | ICD-10-CM | POA: Diagnosis not present

## 2021-10-24 DIAGNOSIS — F419 Anxiety disorder, unspecified: Secondary | ICD-10-CM | POA: Diagnosis not present

## 2021-10-29 ENCOUNTER — Ambulatory Visit: Payer: Self-pay

## 2021-10-29 ENCOUNTER — Ambulatory Visit: Payer: PPO | Admitting: Specialist

## 2021-10-29 ENCOUNTER — Encounter: Payer: Self-pay | Admitting: Specialist

## 2021-10-29 VITALS — BP 126/84 | HR 90 | Ht 68.0 in | Wt 215.0 lb

## 2021-10-29 DIAGNOSIS — M47812 Spondylosis without myelopathy or radiculopathy, cervical region: Secondary | ICD-10-CM

## 2021-10-29 DIAGNOSIS — M4810 Ankylosing hyperostosis [Forestier], site unspecified: Secondary | ICD-10-CM | POA: Diagnosis not present

## 2021-10-29 NOTE — Progress Notes (Signed)
Office Visit Note   Patient: Jesus Brown           Date of Birth: 05/20/1965           MRN: [id number] Visit Date: 10/29/2021              Requested by: Merrilee Seashore, [address] [place],  [place] [address] PCP: Merrilee Seashore, MD   Assessment & Plan: Visit Diagnoses:  1. Cervical spondylosis without myelopathy   2. Forestier's disease     Plan: Avoid overhead lifting and overhead use of the arms. Do not lift greater than 5 lbs. Adjust head rest in vehicle to prevent hyperextension if rear ended. Take extra precautions to avoid falling, including use of a cane if you feel weak. If you have worsening arm or leg numbness or weakness please call or go to an ER. Hot showers in the AM.  Injection with steroid may be of benefit. Hemp CBD capsules, amazon.com 5,000-7,000 mg per bottle, 60 capsules per bottle, take one capsule twice a day. Cane in the left hand to use with left leg weight bearing. Follow-Up Instructions: No follow-ups on file.   Voltaren gel 1% applied 3-4 times per day may provide local antiinflamatory affect.   Follow-Up Instructions: Return in about 3 months (around 01/29/2022).   Orders:  Orders Placed This Encounter  Procedures   XR Cervical Spine 2 or 3 views   No orders of the defined types were placed in this encounter.     Procedures: No procedures performed   Clinical Data: No additional findings.   Subjective: Chief Complaint  Patient presents with   Neck - Pain    HPI  Review of Systems   Objective: Vital Signs: BP 126/84 (BP Location: Left Arm, Patient Position: Sitting)   Pulse 90   Ht '5\' 8"'$  (1.727 m)   Wt 215 lb (97.5 kg)   BMI 32.69 kg/m   Physical Exam  Ortho Exam  Specialty Comments:  No specialty comments available.  Imaging: No results found.   PMFS History: Patient Active Problem List   Diagnosis Date Noted   Retained orthopedic hardware 12/19/2017    Priority: High     Class: Chronic   Bilateral primary osteoarthritis of knee 07/19/2016    Priority: High    Class: Chronic   Cervical spondylosis without myelopathy 03/25/2014    Priority: High    Class: Chronic   Left cervical radiculopathy 03/25/2014    Priority: High    Class: Chronic   Closed fracture of right ankle    Cellulitis of right leg 05/03/2017   Hypokalemia 05/03/2017   Hypertension 05/03/2017   GERD (gastroesophageal reflux disease) 05/03/2017   Anxiety 05/03/2017   S/P total knee replacement using cement, right 04/11/2017   Status post total left knee replacement 09/26/2016   Presence of left artificial knee joint 07/25/2016   Pain and swelling of left lower leg 07/25/2016   Unilateral primary osteoarthritis, right knee 07/19/2016   Erythrocytosis due to endocrine disorders 09/08/2015   Hypotestosteronemia 09/08/2015   Cervical spondylosis with radiculopathy 03/25/2014   Past Medical History:  Diagnosis Date   Anxiety    mainly r/t pain   Arthritis    neck, knees   Asthma    AS CHILD    Back pain, chronic    DDD (degenerative disc disease)    Dysphagia    Erythrocytosis due to endocrine disorders 09/08/2015   GERD (gastroesophageal reflux disease)  Hypertension    Hypotestosteronemia 09/08/2015   Neck pain, chronic     Family History  Problem Relation Age of Onset   Hypertension Mother    Heart attack Father     Past Surgical History:  Procedure Laterality Date   ANKLE SURGERY     BACK SURGERY  [phone]   lumb fusionx2   CERVICAL FUSION  [phone]   3 surgeries   DIRECT LARYNGOSCOPY N/A 11/09/2012   Procedure: DIRECT LARYNGOSCOPY;  Surgeon: Jodi Marble, MD;  Location: [place];  Service: ENT;  Laterality: N/A;   ESOPHAGOGASTRODUODENOSCOPY N/A 08/03/2012   Procedure: ESOPHAGOGASTRODUODENOSCOPY (EGD);  Surgeon: Arta Silence, MD;  Location: [facility] ENDOSCOPY;  Service: Endoscopy;  Laterality: N/A;   ESOPHAGOSCOPY W/ BOTOX INJECTION N/A 11/09/2012    Procedure: ESOPHAGOSCOPY POSSIBLE BIOPSY AND POSSIBLE BOTOX INJECTION CRICOPHARYNGEUS;  Surgeon: Jodi Marble, MD;  Location: [place];  Service: ENT;  Laterality: N/A;  Esophagoscopy with botox injection   HARDWARE REMOVAL Right 12/19/2017   Procedure: REMOVAL OF SMALL FRAGMENT PLATE AND SCREWS RIGHT LATERAL ANKLE;  Surgeon: Jessy Oto, MD;  Location: [place];  Service: Orthopedics;  Laterality: Right;   HERNIA REPAIR     ing-rt   KNEE SURGERY     rt and left x2 each=4   POSTERIOR CERVICAL FUSION/FORAMINOTOMY N/A 03/25/2014   Procedure: LEFT C7-T1 FORAMINOTOMY;  Surgeon: Jessy Oto, MD;  Location: [place];  Service: Orthopedics;  Laterality: N/A;   SHOULDER SURGERY  1999   rt and lt   TONSILLECTOMY     TOTAL KNEE ARTHROPLASTY Left 07/19/2016   Procedure: LEFT TOTAL KNEE ARTHROPLASTY CORTISON INJECTION IN RIGHT KNEE;  Surgeon: Jessy Oto, MD;  Location: [place];  Service: Orthopedics;  Laterality: Left;  MAY NEED RNFA TO FINISH CASE PER [place]    TOTAL KNEE ARTHROPLASTY Right 04/11/2017   Procedure: RIGHT TOTAL KNEE ARTHROPLASTY;  Surgeon: Jessy Oto, MD;  Location: [facility];  Service: Orthopedics;  Laterality: Right;   WRIST SURGERY     lt and rt   Social History   Occupational History   Not on file  Tobacco Use   Smoking status: Never   Smokeless tobacco: Never  Vaping Use   Vaping Use: Never used  Substance and Sexual Activity   Alcohol use: No   Drug use: No   Sexual activity: Not on file

## 2021-10-29 NOTE — Patient Instructions (Signed)
Avoid overhead lifting and overhead use of the arms. Do not lift greater than 5 lbs. Adjust head rest in vehicle to prevent hyperextension if rear ended. Take extra precautions to avoid falling, including use of a cane if you feel weak. If you have worsening arm or leg numbness or weakness please call or go to an ER. Hot showers in the AM.  Injection with steroid may be of benefit. Hemp CBD capsules, amazon.com 5,000-7,000 mg per bottle, 60 capsules per bottle, take one capsule twice a day. Cane in the left hand to use with left leg weight bearing. Follow-Up Instructions: No follow-ups on file.   Voltaren gel 1% applied 3-4 times per day may provide local antiinflamatory affect.

## 2021-11-13 DIAGNOSIS — J3089 Other allergic rhinitis: Secondary | ICD-10-CM | POA: Diagnosis not present

## 2021-11-13 DIAGNOSIS — J301 Allergic rhinitis due to pollen: Secondary | ICD-10-CM | POA: Diagnosis not present

## 2021-11-13 DIAGNOSIS — J3081 Allergic rhinitis due to animal (cat) (dog) hair and dander: Secondary | ICD-10-CM | POA: Diagnosis not present

## 2021-12-05 ENCOUNTER — Telehealth: Payer: Self-pay | Admitting: Specialist

## 2021-12-05 DIAGNOSIS — M62830 Muscle spasm of back: Secondary | ICD-10-CM | POA: Diagnosis not present

## 2021-12-05 DIAGNOSIS — M47812 Spondylosis without myelopathy or radiculopathy, cervical region: Secondary | ICD-10-CM | POA: Diagnosis not present

## 2021-12-05 DIAGNOSIS — G894 Chronic pain syndrome: Secondary | ICD-10-CM | POA: Diagnosis not present

## 2021-12-05 DIAGNOSIS — F419 Anxiety disorder, unspecified: Secondary | ICD-10-CM | POA: Diagnosis not present

## 2021-12-05 NOTE — Telephone Encounter (Signed)
Please copy 10/30/21 C-Spine xrys to CD for [facility]. Let me know when ready. Thank you!!

## 2021-12-05 NOTE — Telephone Encounter (Signed)
CD is ready! 

## 2021-12-06 NOTE — Telephone Encounter (Signed)
CD mailed to [facility]

## 2021-12-14 ENCOUNTER — Ambulatory Visit: Payer: PPO | Admitting: Cardiology

## 2022-01-09 DIAGNOSIS — G894 Chronic pain syndrome: Secondary | ICD-10-CM | POA: Diagnosis not present

## 2022-01-09 DIAGNOSIS — F419 Anxiety disorder, unspecified: Secondary | ICD-10-CM | POA: Diagnosis not present

## 2022-01-09 DIAGNOSIS — M47812 Spondylosis without myelopathy or radiculopathy, cervical region: Secondary | ICD-10-CM | POA: Diagnosis not present

## 2022-01-09 DIAGNOSIS — M62830 Muscle spasm of back: Secondary | ICD-10-CM | POA: Diagnosis not present

## 2022-02-06 DIAGNOSIS — F419 Anxiety disorder, unspecified: Secondary | ICD-10-CM | POA: Diagnosis not present

## 2022-02-06 DIAGNOSIS — G894 Chronic pain syndrome: Secondary | ICD-10-CM | POA: Diagnosis not present

## 2022-02-06 DIAGNOSIS — M47812 Spondylosis without myelopathy or radiculopathy, cervical region: Secondary | ICD-10-CM | POA: Diagnosis not present

## 2022-02-06 DIAGNOSIS — M62830 Muscle spasm of back: Secondary | ICD-10-CM | POA: Diagnosis not present

## 2022-03-06 DIAGNOSIS — M62830 Muscle spasm of back: Secondary | ICD-10-CM | POA: Diagnosis not present

## 2022-03-06 DIAGNOSIS — M47812 Spondylosis without myelopathy or radiculopathy, cervical region: Secondary | ICD-10-CM | POA: Diagnosis not present

## 2022-03-06 DIAGNOSIS — G894 Chronic pain syndrome: Secondary | ICD-10-CM | POA: Diagnosis not present

## 2022-03-06 DIAGNOSIS — F419 Anxiety disorder, unspecified: Secondary | ICD-10-CM | POA: Diagnosis not present

## 2022-04-03 DIAGNOSIS — M62830 Muscle spasm of back: Secondary | ICD-10-CM | POA: Diagnosis not present

## 2022-04-03 DIAGNOSIS — G894 Chronic pain syndrome: Secondary | ICD-10-CM | POA: Diagnosis not present

## 2022-04-03 DIAGNOSIS — M47812 Spondylosis without myelopathy or radiculopathy, cervical region: Secondary | ICD-10-CM | POA: Diagnosis not present

## 2022-04-03 DIAGNOSIS — F419 Anxiety disorder, unspecified: Secondary | ICD-10-CM | POA: Diagnosis not present

## 2022-05-01 DIAGNOSIS — M47812 Spondylosis without myelopathy or radiculopathy, cervical region: Secondary | ICD-10-CM | POA: Diagnosis not present

## 2022-05-01 DIAGNOSIS — G894 Chronic pain syndrome: Secondary | ICD-10-CM | POA: Diagnosis not present

## 2022-05-01 DIAGNOSIS — M62830 Muscle spasm of back: Secondary | ICD-10-CM | POA: Diagnosis not present

## 2022-05-01 DIAGNOSIS — F419 Anxiety disorder, unspecified: Secondary | ICD-10-CM | POA: Diagnosis not present

## 2022-05-02 DIAGNOSIS — Z125 Encounter for screening for malignant neoplasm of prostate: Secondary | ICD-10-CM | POA: Diagnosis not present

## 2022-05-02 DIAGNOSIS — D751 Secondary polycythemia: Secondary | ICD-10-CM | POA: Diagnosis not present

## 2022-05-02 DIAGNOSIS — R7301 Impaired fasting glucose: Secondary | ICD-10-CM | POA: Diagnosis not present

## 2022-05-02 DIAGNOSIS — I10 Essential (primary) hypertension: Secondary | ICD-10-CM | POA: Diagnosis not present

## 2022-05-02 DIAGNOSIS — Z0000 Encounter for general adult medical examination without abnormal findings: Secondary | ICD-10-CM | POA: Diagnosis not present

## 2022-05-09 ENCOUNTER — Other Ambulatory Visit: Payer: Self-pay | Admitting: Physical Medicine and Rehabilitation

## 2022-05-09 DIAGNOSIS — M5412 Radiculopathy, cervical region: Secondary | ICD-10-CM

## 2022-05-24 DIAGNOSIS — J3081 Allergic rhinitis due to animal (cat) (dog) hair and dander: Secondary | ICD-10-CM | POA: Diagnosis not present

## 2022-05-24 DIAGNOSIS — J3089 Other allergic rhinitis: Secondary | ICD-10-CM | POA: Diagnosis not present

## 2022-05-24 DIAGNOSIS — J301 Allergic rhinitis due to pollen: Secondary | ICD-10-CM | POA: Diagnosis not present

## 2022-05-29 DIAGNOSIS — G894 Chronic pain syndrome: Secondary | ICD-10-CM | POA: Diagnosis not present

## 2022-05-29 DIAGNOSIS — M62830 Muscle spasm of back: Secondary | ICD-10-CM | POA: Diagnosis not present

## 2022-05-29 DIAGNOSIS — F419 Anxiety disorder, unspecified: Secondary | ICD-10-CM | POA: Diagnosis not present

## 2022-05-29 DIAGNOSIS — M47812 Spondylosis without myelopathy or radiculopathy, cervical region: Secondary | ICD-10-CM | POA: Diagnosis not present

## 2022-06-06 ENCOUNTER — Ambulatory Visit: Payer: PPO | Admitting: Cardiology

## 2022-06-11 ENCOUNTER — Other Ambulatory Visit: Payer: No Typology Code available for payment source

## 2022-06-12 DIAGNOSIS — D751 Secondary polycythemia: Secondary | ICD-10-CM | POA: Diagnosis not present

## 2022-06-12 DIAGNOSIS — R7301 Impaired fasting glucose: Secondary | ICD-10-CM | POA: Diagnosis not present

## 2022-06-12 DIAGNOSIS — E669 Obesity, unspecified: Secondary | ICD-10-CM | POA: Diagnosis not present

## 2022-06-12 DIAGNOSIS — E291 Testicular hypofunction: Secondary | ICD-10-CM | POA: Diagnosis not present

## 2022-06-12 DIAGNOSIS — I10 Essential (primary) hypertension: Secondary | ICD-10-CM | POA: Diagnosis not present

## 2022-06-12 DIAGNOSIS — K219 Gastro-esophageal reflux disease without esophagitis: Secondary | ICD-10-CM | POA: Diagnosis not present

## 2022-06-12 DIAGNOSIS — Z0000 Encounter for general adult medical examination without abnormal findings: Secondary | ICD-10-CM | POA: Diagnosis not present

## 2022-06-18 ENCOUNTER — Encounter: Payer: Self-pay | Admitting: Cardiology

## 2022-06-18 ENCOUNTER — Ambulatory Visit: Payer: PPO | Admitting: Cardiology

## 2022-06-18 VITALS — BP 130/80 | HR 88 | Resp 18 | Ht 68.0 in | Wt 240.6 lb

## 2022-06-18 DIAGNOSIS — R011 Cardiac murmur, unspecified: Secondary | ICD-10-CM

## 2022-06-18 DIAGNOSIS — I10 Essential (primary) hypertension: Secondary | ICD-10-CM

## 2022-06-18 DIAGNOSIS — E6609 Other obesity due to excess calories: Secondary | ICD-10-CM

## 2022-06-18 NOTE — Progress Notes (Addendum)
ID:  KC MANCILLA, DOB July 20, 1965, MRN [id number]  PCP:  Merrilee Seashore, MD  Cardiologist:  Rex Kras, DO, [facility] (established care 06/18/2022)  REASON FOR CONSULT: Cardiac Murmur   REQUESTING PHYSICIAN:  Merrilee Seashore, [place] [address]   Chief Complaint  Patient presents with   Heart Murmur   New Patient (Initial Visit)    HPI  LINDSAY GIBEAULT is a 57 y.o. Caucasian male who presents to the clinic for evaluation of cardiac murmur at the request of Merrilee Seashore, MD. His past medical history and cardiovascular risk factors include: HTN, hypogonadism, obesity.  Last year when he saw his primary care provider he was told that he had a cardiac murmur and was referred to cardiology.  He now presents for establishing care and further evaluation.  He was seen by cardiologist in 1995 after his service for stress test and other diagnostic testing at that time nothing remarkable was noted per patient.  I do not have those records for review.  He denies anginal discomfort or heart failure symptoms.  But just feels more tired and fatigued compared to baseline.   FUNCTIONAL STATUS: No structured exercise program or daily routine -limited due to prior knee surgeries of back surgeries.  ALLERGIES: Allergies  Allergen Reactions   Adhesive [Tape] Other (See Comments)    Blisters, NO STERI STRIPS  paper tape is ok  Tegaderm - caused redness, itching, and burning sensation to the site, use gauze dressing over IV sites   Baclofen Nausea Only and Other (See Comments)    Aches and pains    Indocin [Indomethacin] Other (See Comments)    Burns stomach    MEDICATION LIST PRIOR TO VISIT: Current Meds  Medication Sig   ANDROGEL PUMP 20.25 MG/ACT (1.62%) GEL    EPINEPHrine (EPIPEN 2-PAK) 0.3 mg/0.3 mL IJ SOAJ injection Inject 0.3 mg into the muscle as needed (allergic reaction).   loratadine (CLARITIN) 10 MG tablet Take 10 mg by mouth daily.    Morphine Sulfate Beads 45 MG CP24 Take 1 capsule by mouth daily.   omeprazole (PRILOSEC) 20 MG capsule Take 20 mg by mouth daily.    PRESCRIPTION MEDICATION Inject 2 Doses as directed once a week. Allergy shots   Testosterone (ANDROGEL) 20.25 MG/1.25GM (1.62%) GEL Place 2 Pump onto the skin daily.    valsartan (DIOVAN) 160 MG tablet Take 160 mg by mouth daily.      PAST MEDICAL HISTORY: Past Medical History:  Diagnosis Date   Anxiety    mainly r/t pain   Arthritis    neck, knees   Asthma    AS CHILD    Back pain, chronic    DDD (degenerative disc disease)    Dysphagia    Erythrocytosis due to endocrine disorders 09/08/2015   GERD (gastroesophageal reflux disease)    Hypertension    Hypotestosteronemia 09/08/2015   Neck pain, chronic     PAST SURGICAL HISTORY: Past Surgical History:  Procedure Laterality Date   ANKLE SURGERY     BACK SURGERY  2010,2011   lumb fusionx2   CERVICAL FUSION  [id number]   3 surgeries   DIRECT LARYNGOSCOPY N/A 11/09/2012   Procedure: DIRECT LARYNGOSCOPY;  Surgeon: Jodi Marble, MD;  Location: [place];  Service: ENT;  Laterality: N/A;   ESOPHAGOGASTRODUODENOSCOPY N/A 08/03/2012   Procedure: ESOPHAGOGASTRODUODENOSCOPY (EGD);  Surgeon: Arta Silence, MD;  Location: [facility] ENDOSCOPY;  Service: Endoscopy;  Laterality: N/A;   ESOPHAGOSCOPY W/  BOTOX INJECTION N/A 11/09/2012   Procedure: ESOPHAGOSCOPY POSSIBLE BIOPSY AND POSSIBLE BOTOX INJECTION CRICOPHARYNGEUS;  Surgeon: Jodi Marble, MD;  Location: [place];  Service: ENT;  Laterality: N/A;  Esophagoscopy with botox injection   HARDWARE REMOVAL Right 12/19/2017   Procedure: REMOVAL OF SMALL FRAGMENT PLATE AND SCREWS RIGHT LATERAL ANKLE;  Surgeon: Jessy Oto, MD;  Location: [place];  Service: Orthopedics;  Laterality: Right;   HERNIA REPAIR     ing-rt   KNEE SURGERY     rt and left x2 each=4   POSTERIOR CERVICAL FUSION/FORAMINOTOMY N/A 03/25/2014    Procedure: LEFT C7-T1 FORAMINOTOMY;  Surgeon: Jessy Oto, MD;  Location: [place];  Service: Orthopedics;  Laterality: N/A;   SHOULDER SURGERY  1999   rt and lt   TONSILLECTOMY     TOTAL KNEE ARTHROPLASTY Left 07/19/2016   Procedure: LEFT TOTAL KNEE ARTHROPLASTY CORTISON INJECTION IN RIGHT KNEE;  Surgeon: Jessy Oto, MD;  Location: [place];  Service: Orthopedics;  Laterality: Left;  MAY NEED RNFA TO FINISH CASE PER SHERRIE    TOTAL KNEE ARTHROPLASTY Right 04/11/2017   Procedure: RIGHT TOTAL KNEE ARTHROPLASTY;  Surgeon: Jessy Oto, MD;  Location: [place];  Service: Orthopedics;  Laterality: Right;   WRIST SURGERY     lt and rt    FAMILY HISTORY: The patient family history includes Alzheimer's disease in his mother; COPD in his father; Glaucoma in his sister; Heart attack in his father; Heart disease in his father; Hypertension in his mother; Scoliosis in his sister.  SOCIAL HISTORY:  The patient  reports that he has never smoked. He has never used smokeless tobacco. He reports that he does not drink alcohol and does not use drugs.  REVIEW OF SYSTEMS: Review of Systems  Constitutional: Positive for malaise/fatigue.  Cardiovascular:  Negative for chest pain, claudication, dyspnea on exertion, irregular heartbeat, leg swelling, near-syncope, orthopnea, palpitations, paroxysmal nocturnal dyspnea and syncope.  Respiratory:  Positive for snoring. Negative for shortness of breath.   Hematologic/Lymphatic: Negative for bleeding problem.  Musculoskeletal:  Negative for muscle cramps and myalgias.  Neurological:  Negative for dizziness and light-headedness.    PHYSICAL EXAM:    06/18/2022    9:35 AM 06/18/2022    8:36 AM 06/18/2022    8:33 AM  Vitals with BMI  Height   '5\' 8"'$   Weight   240 lbs 10 oz  BMI   [id number]  Systolic [id number]  Diastolic 80 87 83  Pulse 88  95    Physical Exam  Constitutional: No distress.  Age appropriate, hemodynamically stable.   Neck: No JVD present.   Cardiovascular: Normal rate, regular rhythm, S1 normal, S2 normal, intact distal pulses and normal pulses. Exam reveals no gallop, no S3 and no S4.  Murmur heard. Crescendo-decrescendo systolic murmur is present with a grade of 3/6 at the upper right sternal border. Pulmonary/Chest: Effort normal and breath sounds normal. No stridor. He has no wheezes. He has no rales.  Abdominal: Soft. Bowel sounds are normal. He exhibits no distension. There is no abdominal tenderness.  Musculoskeletal:        General: No edema.     Cervical back: Neck supple.  Neurological: He is alert and oriented to person, place, and time. He has intact cranial nerves (2-12).  Skin: Skin is warm and moist.   CARDIAC DATABASE: EKG: Sinus Rhythm , 90bpm, LAE, without underlying ischemia or injury pattern.  Echocardiogram: No results found for  this or any previous visit from the past 1095 days.    Stress Testing: No results found for this or any previous visit from the past 1095 days.   Heart Catheterization: None  LABORATORY DATA:    Latest Ref Rng & Units 03/13/2020    5:15 PM 05/05/2017    2:49 AM 05/04/2017    2:07 AM  CBC  WBC 4.0 - 10.5 K/uL 8.5  9.3  13.5   Hemoglobin 13.0 - 17.0 g/dL 12.0  12.2  11.3   Hematocrit 39.0 - 52.0 % 42.9  38.0  36.2   Platelets 150 - 400 K/uL 301  235  258        Latest Ref Rng & Units 03/13/2020    5:15 PM 12/16/2017   10:00 AM 05/04/2017    2:07 AM  CMP  Glucose 70 - 99 mg/dL 100  104  104   BUN 6 - 20 mg/dL '17  16  18   '$ Creatinine 0.61 - 1.24 mg/dL 0.93  0.90  1.03   Sodium 135 - 145 mmol/L 137  139  138   Potassium 3.5 - 5.1 mmol/L 3.7  4.0  3.5   Chloride 98 - 111 mmol/L 103  102  104   CO2 22 - 32 mmol/L '22  27  26   '$ Calcium 8.9 - 10.3 mg/dL 9.2  9.3  8.1   Total Protein 6.5 - 8.1 g/dL 6.8     Total Bilirubin 0.3 - 1.2 mg/dL 0.6     Alkaline Phos 38 - 126 U/L 75     AST 15 - 41 U/L 36     ALT 0 - 44 U/L 40       Lipid Panel     Component Value  Date/Time   CHOL 189 12/20/2010 0435   TRIG 161 (H) 12/20/2010 0435   HDL 35 (L) 12/20/2010 0435   CHOLHDL 5.4 12/20/2010 0435   VLDL 32 12/20/2010 0435   LDLCALC 122 (H) 12/20/2010 0435    No components found for: "NTPROBNP" No results for input(s): "PROBNP" in the last 8760 hours. No results for input(s): "TSH" in the last 8760 hours.  BMP No results for input(s): "NA", "K", "CL", "CO2", "GLUCOSE", "BUN", "CREATININE", "CALCIUM", "GFRNONAA", "GFRAA" in the last 8760 hours.  HEMOGLOBIN A1C Lab Results  Component Value Date   HGBA1C (L) 10/19/2008    4.5 (NOTE) The ADA recommends the following therapeutic goal for glycemic control related to Hgb A1c measurement: Goal of therapy: <6.5 Hgb A1c  Reference: American Diabetes Association: Clinical Practice Recommendations 2010, Diabetes Care, 2010, 33: (Suppl  1).   MPG 82 10/19/2008    IMPRESSION:    ICD-10-CM   1. Murmur  R01.1 EKG 12-Lead    2. Benign hypertension  I10     3. Class 2 obesity due to excess calories without serious comorbidity with body mass index (BMI) of 36.0 to 36.9 in adult  E66.09    Z68.36        RECOMMENDATIONS: TRUSTEN NORMOYLE is a 57 y.o. Caucasian male whose past medical history and cardiac risk factors include: HTN, hypogonadism, obesity.  Referred to the practice for evaluation of a cardiac murmur.  On physical examination he does have systolic murmur at the second right intercostal space concerning for probable aortic stenosis.  Echo will be ordered to evaluate for structural heart disease and left ventricular systolic function.  Clinically denies anginal discomfort, heart failure symptoms, near-syncope/syncope.  Office blood pressures are  within acceptable limits on recheck.  Have asked him to keep a log of his blood pressures and to review with either myself or PCP.  Given his generalized tired and fatigue he does have underlying hypogonadism which may be contributory.  However I suspect that he  also has a degree of sleep apnea based on clinical examination.  I have asked him to discuss sleep study with PCP at his next visit.  Outside labs are not available to review during today's encounter.  Labs have been requested from PCPs office.   FINAL MEDICATION LIST END OF ENCOUNTER: No orders of the defined types were placed in this encounter.   Medications Discontinued During This Encounter  Medication Reason   allopurinol (ZYLOPRIM) 100 MG tablet Patient Preference   Aspirin-Acetaminophen-Caffeine (GOODY HEADACHE PO)    doxycycline (VIBRA-TABS) 100 MG tablet Completed Course   hydrochlorothiazide (HYDRODIURIL) 25 MG tablet Discontinued by provider   morphine (AVINZA) 60 MG 24 hr capsule Change in therapy   morphine (MSIR) 15 MG tablet Change in therapy     Current Outpatient Medications:    ANDROGEL PUMP 20.25 MG/ACT (1.62%) GEL, , Disp: , Rfl: 1   EPINEPHrine (EPIPEN 2-PAK) 0.3 mg/0.3 mL IJ SOAJ injection, Inject 0.3 mg into the muscle as needed (allergic reaction)., Disp: , Rfl:    loratadine (CLARITIN) 10 MG tablet, Take 10 mg by mouth daily., Disp: , Rfl:    Morphine Sulfate Beads 45 MG CP24, Take 1 capsule by mouth daily., Disp: , Rfl:    omeprazole (PRILOSEC) 20 MG capsule, Take 20 mg by mouth daily. , Disp: , Rfl:    PRESCRIPTION MEDICATION, Inject 2 Doses as directed once a week. Allergy shots, Disp: , Rfl:    Testosterone (ANDROGEL) 20.25 MG/1.25GM (1.62%) GEL, Place 2 Pump onto the skin daily. , Disp: , Rfl:    valsartan (DIOVAN) 160 MG tablet, Take 160 mg by mouth daily. , Disp: , Rfl: 0  Orders Placed This Encounter  Procedures   EKG 12-Lead    There are no Patient Instructions on file for this visit.   --Continue cardiac medications as reconciled in final medication list. --Return in about 6 weeks (around 07/30/2022) for Follow up, cardiac murmur evaluation, review test results. or sooner if needed. --Continue follow-up with your primary care physician regarding  the management of your other chronic comorbid conditions.  Patient's questions and concerns were addressed to his satisfaction. He voices understanding of the instructions provided during this encounter.   This note was created using a voice recognition software as a result there may be grammatical errors inadvertently enclosed that do not reflect the nature of this encounter. Every attempt is made to correct such errors.  Rex Kras, [place], [facility]  Pager:  [phone] Office: [phone]  ADDENDUM; External labs Collected 04/16/2022 A1c 5.8 Total cholesterol 165, TAG 75, HDL 42, LDLc 108, NHDL 123 TSH 0.98 Hb 15.7, Hct 47.2 Sodium 140, Potassium 4.5, Cl 102, Co2 29,  BUN 24, Cr 0.86 AST 24, ALT 34, AlkPhos 84,  GFR 96  Terrian Sentell [facility]  Pager:  [phone] Office: [phone]

## 2022-06-24 DIAGNOSIS — M47812 Spondylosis without myelopathy or radiculopathy, cervical region: Secondary | ICD-10-CM | POA: Diagnosis not present

## 2022-06-24 DIAGNOSIS — G894 Chronic pain syndrome: Secondary | ICD-10-CM | POA: Diagnosis not present

## 2022-06-24 DIAGNOSIS — F419 Anxiety disorder, unspecified: Secondary | ICD-10-CM | POA: Diagnosis not present

## 2022-06-24 DIAGNOSIS — M62830 Muscle spasm of back: Secondary | ICD-10-CM | POA: Diagnosis not present

## 2022-07-03 ENCOUNTER — Ambulatory Visit
Admission: RE | Admit: 2022-07-03 | Discharge: 2022-07-03 | Disposition: A | Discharge status: Home / Self Care | Payer: PPO | Source: Ambulatory Visit | Attending: Physical Medicine and Rehabilitation | Admitting: Physical Medicine and Rehabilitation

## 2022-07-03 DIAGNOSIS — M5125 Other intervertebral disc displacement, thoracolumbar region: Secondary | ICD-10-CM | POA: Diagnosis not present

## 2022-07-03 DIAGNOSIS — M47812 Spondylosis without myelopathy or radiculopathy, cervical region: Secondary | ICD-10-CM | POA: Diagnosis not present

## 2022-07-03 DIAGNOSIS — M5412 Radiculopathy, cervical region: Secondary | ICD-10-CM

## 2022-07-03 MED ORDER — GADOPICLENOL 0.5 MMOL/ML IV SOLN
10.0000 mL | Freq: Once | INTRAVENOUS | Status: AC | PRN
  Administered 2022-07-03: 10 mL via INTRAVENOUS

## 2022-07-10 ENCOUNTER — Other Ambulatory Visit: Payer: Self-pay | Admitting: Cardiology

## 2022-07-10 DIAGNOSIS — R011 Cardiac murmur, unspecified: Secondary | ICD-10-CM

## 2022-07-17 ENCOUNTER — Ambulatory Visit: Payer: PPO

## 2022-07-17 DIAGNOSIS — R011 Cardiac murmur, unspecified: Secondary | ICD-10-CM | POA: Diagnosis not present

## 2022-07-17 DIAGNOSIS — I10 Essential (primary) hypertension: Secondary | ICD-10-CM | POA: Diagnosis not present

## 2022-07-23 DIAGNOSIS — M47812 Spondylosis without myelopathy or radiculopathy, cervical region: Secondary | ICD-10-CM | POA: Diagnosis not present

## 2022-07-23 DIAGNOSIS — F419 Anxiety disorder, unspecified: Secondary | ICD-10-CM | POA: Diagnosis not present

## 2022-07-23 DIAGNOSIS — M62830 Muscle spasm of back: Secondary | ICD-10-CM | POA: Diagnosis not present

## 2022-07-23 DIAGNOSIS — G894 Chronic pain syndrome: Secondary | ICD-10-CM | POA: Diagnosis not present

## 2022-07-30 ENCOUNTER — Encounter: Payer: Self-pay | Admitting: Cardiology

## 2022-07-30 ENCOUNTER — Ambulatory Visit: Payer: PPO | Admitting: Cardiology

## 2022-07-30 VITALS — BP 133/89 | HR 92 | Resp 18 | Ht 68.0 in | Wt 241.4 lb

## 2022-07-30 DIAGNOSIS — R011 Cardiac murmur, unspecified: Secondary | ICD-10-CM | POA: Diagnosis not present

## 2022-07-30 DIAGNOSIS — I10 Essential (primary) hypertension: Secondary | ICD-10-CM

## 2022-07-30 DIAGNOSIS — Z6836 Body mass index (BMI) 36.0-36.9, adult: Secondary | ICD-10-CM | POA: Diagnosis not present

## 2022-07-30 DIAGNOSIS — E6609 Other obesity due to excess calories: Secondary | ICD-10-CM

## 2022-07-30 NOTE — Progress Notes (Signed)
ID:  Jesus Brown, DOB [birth year], MRN [id number]  PCP:  Jesus Lerner, DO  Cardiologist:  Jesus Lerner, DO, [facility] (established care 06/18/2022)  Date: 07/30/22 Last Office Visit: 06/18/2022  Chief Complaint  Patient presents with   Heart Murmur   Follow-up    6 weeks    HPI  Jesus Brown is a 57 y.o. Caucasian male whose past medical history and cardiovascular risk factors include: HTN, hypogonadism, obesity.  Referred to the practice for evaluation of a cardiac murmur.  At last office visit the shared decision was to proceed with an echocardiogram which notes preserved LVEF, grade 1 diastolic impairment, trace MR, mild TR.  Since last office visit, he is doing well from a cardiovascular standpoint.  No new chest pain or heart failure symptoms.  FUNCTIONAL STATUS: No structured exercise program or daily routine -limited due to prior knee surgeries of back surgeries.  ALLERGIES: Allergies  Allergen Reactions   Adhesive [Tape] Other (See Comments)    Blisters, NO STERI STRIPS  paper tape is ok  Tegaderm - caused redness, itching, and burning sensation to the site, use gauze dressing over IV sites   Baclofen Nausea Only and Other (See Comments)    Aches and pains    Indocin [Indomethacin] Other (See Comments)    Burns stomach    MEDICATION LIST PRIOR TO VISIT: Current Meds  Medication Sig   ANDROGEL PUMP 20.25 MG/ACT (1.62%) GEL    EPINEPHrine (EPIPEN 2-PAK) 0.3 mg/0.3 mL IJ SOAJ injection Inject 0.3 mg into the muscle as needed (allergic reaction).   loratadine (CLARITIN) 10 MG tablet Take 10 mg by mouth daily.   Morphine Sulfate Beads 45 MG CP24 Take 1 capsule by mouth daily.   omeprazole (PRILOSEC) 20 MG capsule Take 20 mg by mouth daily.    PRESCRIPTION MEDICATION Inject 2 Doses as directed once a week. Allergy shots   valsartan (DIOVAN) 160 MG tablet Take 160 mg by mouth daily.      PAST MEDICAL HISTORY: Past Medical History:  Diagnosis Date   Anxiety    mainly  r/t pain   Arthritis    neck, knees   Asthma    AS CHILD    Back pain, chronic    DDD (degenerative disc disease)    Dysphagia    Erythrocytosis due to endocrine disorders 09/08/2015   GERD (gastroesophageal reflux disease)    Hypertension    Hypotestosteronemia 09/08/2015   Neck pain, chronic     PAST SURGICAL HISTORY: Past Surgical History:  Procedure Laterality Date   ANKLE SURGERY     BACK SURGERY  2010,2011   lumb fusionx2   CERVICAL FUSION  [id number]   3 surgeries   DIRECT LARYNGOSCOPY N/A 11/09/2012   Procedure: DIRECT LARYNGOSCOPY;  Surgeon: Jesus Shanks, MD;  Location: [facility];  Service: ENT;  Laterality: N/A;   ESOPHAGOGASTRODUODENOSCOPY N/A 08/03/2012   Procedure: ESOPHAGOGASTRODUODENOSCOPY (EGD);  Surgeon: Jesus Modena, MD;  Location: [facility] END[facility]COPY;  Service: Endoscopy;  Laterality: N/A;   ESOPHAGOSCOPY W/ BOTOX INJECTION N/A 11/09/2012   Procedure: ESOPHAGOSCOPY POSSIBLE BIOPSY AND POSSIBLE BOTOX INJECTION CRICOPHARYNGEUS;  Surgeon: Jesus Shanks, MD;  Location: [facility];  Service: ENT;  Laterality: N/A;  Esophagoscopy with botox injection   HARDWARE REMOVAL Right 12/19/2017   Procedure: REMOVAL OF SMALL FRAGMENT PLATE AND SCREWS RIGHT LATERAL ANKLE;  Surgeon: Jesus Champagne, MD;  Location: [facility];  Service: Orthopedics;  Laterality: Right;   HERNIA REPAIR  ing-rt   KNEE SURGERY     rt and left x2 each=4   POSTERIOR CERVICAL FUSION/FORAMINOTOMY N/A 03/25/2014   Procedure: LEFT C7-T1 FORAMINOTOMY;  Surgeon: Jesus Champagne, MD;  Location: [facility];  Service: Orthopedics;  Laterality: N/A;   SHOULDER SURGERY  1999   rt and lt   TONSILLECTOMY     TOTAL KNEE ARTHROPLASTY Left 07/19/2016   Procedure: LEFT TOTAL KNEE ARTHROPLASTY CORTISON INJECTION IN RIGHT KNEE;  Surgeon: Jesus Champagne, MD;  Location: [facility];  Service: Orthopedics;  Laterality: Left;  MAY NEED RNFA TO FINISH CASE PER SHERRIE    TOTAL KNEE ARTHROPLASTY Right  04/11/2017   Procedure: RIGHT TOTAL KNEE ARTHROPLASTY;  Surgeon: Jesus Champagne, MD;  Location: [facility];  Service: Orthopedics;  Laterality: Right;   WRIST SURGERY     lt and rt    FAMILY HISTORY: The patient family history includes Alzheimer's disease in his mother; COPD in his father; Glaucoma in his sister; Heart attack in his father; Heart disease in his father; Hypertension in his mother; Scoliosis in his sister.  SOCIAL HISTORY:  The patient  reports that he has never smoked. He has never used smokeless tobacco. He reports that he does not drink alcohol and does not use drugs.  REVIEW OF SYSTEMS: Review of Systems  Cardiovascular:  Negative for chest pain, claudication, dyspnea on exertion, irregular heartbeat, leg swelling, near-syncope, orthopnea, palpitations, paroxysmal nocturnal dyspnea and syncope.  Respiratory:  Positive for snoring. Negative for shortness of breath.   Hematologic/Lymphatic: Negative for bleeding problem.  Musculoskeletal:  Negative for muscle cramps and myalgias.  Neurological:  Negative for dizziness and light-headedness.    PHYSICAL EXAM:    07/30/2022    2:10 PM 06/18/2022    9:35 AM 06/18/2022    8:36 AM  Vitals with BMI  Height     Weight 241 lbs 6 oz    BMI 36.71    Systolic 133 130 161  Diastolic 89 80 87  Pulse 92 88     Physical Exam  Constitutional: No distress.  Age appropriate, hemodynamically stable.   Neck: No JVD present.  Cardiovascular: Normal rate, regular rhythm, S1 normal, S2 normal, intact distal pulses and normal pulses. Exam reveals no gallop, no S3 and no S4.  Murmur heard. Systolic murmur is present with a grade of 3/6 at the upper right sternal border. Pulmonary/Chest: Effort normal and breath sounds normal. No stridor. He has no wheezes. He has no rales.  Abdominal: Soft. Bowel sounds are normal. He exhibits no distension. There is no abdominal tenderness.  Musculoskeletal:        General: No edema.     Cervical  back: Neck supple.  Neurological: He is alert and oriented to person, place, and time. He has intact cranial nerves (2-12).  Skin: Skin is warm and moist.   CARDIAC DATABASE: EKG: Sinus Rhythm , 90bpm, LAE, without underlying ischemia or injury pattern.  Echocardiogram: 07/17/2022:  Normal LV systolic function with visual EF 60-65%. Left ventricle cavity is normal in size. Mild concentric hypertrophy of the left ventricle. Normal global wall motion. Doppler evidence of grade I (impaired) diastolic dysfunction, normal LAP. Calculated EF 67%. Left atrial cavity is mildly dilated at 4.2 cm. Trace mitral regurgitation. Mild calcification of the mitral valve annulus. Structurally normal tricuspid valve. Mild tricuspid regurgitation. No evidence of pulmonary hypertension. RVSP measures 33 mmHg. IVC is dilated with respiratory variation. No prior available for comparison.     Stress  Testing: No results found for this or any previous visit from the past 1095 days.   Heart Catheterization: None  LABORATORY DATA: External labs Collected 04/16/2022 A1c 5.8 Total cholesterol 165, TAG 75, HDL 42, LDLc 108, NHDL 123 TSH 0.98 Hb 15.7, Hct 47.2 Sodium 140, Potassium 4.5, Cl 102, Co2 29,  BUN 24, Cr 0.86 AST 24, ALT 34, AlkPhos 84,  GFR 96   IMPRESSION:    ICD-10-CM   1. Murmur  R01.1 ECHOCARDIOGRAM COMPLETE    2. Benign hypertension  I10 ECHOCARDIOGRAM COMPLETE    3. Class 2 obesity due to excess calories without serious comorbidity with body mass index (BMI) of 36.0 to 36.9 in adult  E66.09    Z68.36        RECOMMENDATIONS: KYAIRE GRUENEWALD is a 57 y.o. Caucasian male whose past medical history and cardiac risk factors include: HTN, hypogonadism, obesity.  Patient is referred to the practice for evaluation of a cardiac murmur.  On initial consultation based on physical examination findings there was a concern for a systolic ejection murmur at the second intercostal space.  However,  echocardiogram performed at April 2024 notes preserved LVEF, grade 1 diastolic impairment, trace MR and mild TR.  Clinically denies anginal chest pain, heart failure symptoms, near-syncope or syncopal events.  Had a quick limited echocardiogram performed at today's office visit to reevaluate the aortic valve.  The maximum is less than 2.5 m/s across the aortic valve.  Would like to repeat an echocardiogram at the hospital in 1 year to reevaluate for possible valvular heart disease.  Patient is educated on the importance of improving his modifiable cardiovascular risk factors such as blood pressure, lipids, glycemic control.  Echocardiogram notes mildly dilated left atrium and therefore educated him on the importance of blood pressure management, being evaluated for sleep apnea, weight loss.   FINAL MEDICATION LIST END OF ENCOUNTER: No orders of the defined types were placed in this encounter.   Medications Discontinued During This Encounter  Medication Reason   Testosterone (ANDROGEL) 20.25 MG/1.25GM (1.62%) GEL      Current Outpatient Medications:    ANDROGEL PUMP 20.25 MG/ACT (1.62%) GEL, , Disp: , Rfl: 1   EPINEPHrine (EPIPEN 2-PAK) 0.3 mg/0.3 mL IJ SOAJ injection, Inject 0.3 mg into the muscle as needed (allergic reaction)., Disp: , Rfl:    loratadine (CLARITIN) 10 MG tablet, Take 10 mg by mouth daily., Disp: , Rfl:    Morphine Sulfate Beads 45 MG CP24, Take 1 capsule by mouth daily., Disp: , Rfl:    omeprazole (PRILOSEC) 20 MG capsule, Take 20 mg by mouth daily. , Disp: , Rfl:    PRESCRIPTION MEDICATION, Inject 2 Doses as directed once a week. Allergy shots, Disp: , Rfl:    valsartan (DIOVAN) 160 MG tablet, Take 160 mg by mouth daily. , Disp: , Rfl: 0  Orders Placed This Encounter  Procedures   ECHOCARDIOGRAM COMPLETE    There are no Patient Instructions on file for this visit.   --Continue cardiac medications as reconciled in final medication list. --Return in about 1 year  (around 07/30/2023) for Follow up murmur and s/p echo. or sooner if needed. --Continue follow-up with your primary care physician regarding the management of your other chronic comorbid conditions.  Patient's questions and concerns were addressed to his satisfaction. He voices understanding of the instructions provided during this encounter.   This note was created using a voice recognition software as a result there may be grammatical errors inadvertently enclosed  that do not reflect the nature of this encounter. Every attempt is made to correct such errors.  Jesus Brown, [place], [facility]  Pager:  [phone] Office: [phone]

## 2022-08-12 DIAGNOSIS — M5481 Occipital neuralgia: Secondary | ICD-10-CM | POA: Diagnosis not present

## 2022-08-20 DIAGNOSIS — M62830 Muscle spasm of back: Secondary | ICD-10-CM | POA: Diagnosis not present

## 2022-08-20 DIAGNOSIS — M47812 Spondylosis without myelopathy or radiculopathy, cervical region: Secondary | ICD-10-CM | POA: Diagnosis not present

## 2022-08-20 DIAGNOSIS — F419 Anxiety disorder, unspecified: Secondary | ICD-10-CM | POA: Diagnosis not present

## 2022-08-20 DIAGNOSIS — G894 Chronic pain syndrome: Secondary | ICD-10-CM | POA: Diagnosis not present

## 2022-09-17 DIAGNOSIS — M62830 Muscle spasm of back: Secondary | ICD-10-CM | POA: Diagnosis not present

## 2022-09-17 DIAGNOSIS — G894 Chronic pain syndrome: Secondary | ICD-10-CM | POA: Diagnosis not present

## 2022-09-17 DIAGNOSIS — Z79891 Long term (current) use of opiate analgesic: Secondary | ICD-10-CM | POA: Diagnosis not present

## 2022-09-17 DIAGNOSIS — F419 Anxiety disorder, unspecified: Secondary | ICD-10-CM | POA: Diagnosis not present

## 2022-09-17 DIAGNOSIS — M47812 Spondylosis without myelopathy or radiculopathy, cervical region: Secondary | ICD-10-CM | POA: Diagnosis not present

## 2022-10-16 DIAGNOSIS — G894 Chronic pain syndrome: Secondary | ICD-10-CM | POA: Diagnosis not present

## 2022-10-16 DIAGNOSIS — M47812 Spondylosis without myelopathy or radiculopathy, cervical region: Secondary | ICD-10-CM | POA: Diagnosis not present

## 2022-10-16 DIAGNOSIS — M62830 Muscle spasm of back: Secondary | ICD-10-CM | POA: Diagnosis not present

## 2022-10-16 DIAGNOSIS — F419 Anxiety disorder, unspecified: Secondary | ICD-10-CM | POA: Diagnosis not present

## 2022-11-13 DIAGNOSIS — M47812 Spondylosis without myelopathy or radiculopathy, cervical region: Secondary | ICD-10-CM | POA: Diagnosis not present

## 2022-11-13 DIAGNOSIS — F419 Anxiety disorder, unspecified: Secondary | ICD-10-CM | POA: Diagnosis not present

## 2022-11-13 DIAGNOSIS — G894 Chronic pain syndrome: Secondary | ICD-10-CM | POA: Diagnosis not present

## 2022-11-13 DIAGNOSIS — M62830 Muscle spasm of back: Secondary | ICD-10-CM | POA: Diagnosis not present

## 2022-12-16 DIAGNOSIS — M47812 Spondylosis without myelopathy or radiculopathy, cervical region: Secondary | ICD-10-CM | POA: Diagnosis not present

## 2022-12-16 DIAGNOSIS — F419 Anxiety disorder, unspecified: Secondary | ICD-10-CM | POA: Diagnosis not present

## 2022-12-16 DIAGNOSIS — G894 Chronic pain syndrome: Secondary | ICD-10-CM | POA: Diagnosis not present

## 2022-12-16 DIAGNOSIS — M62830 Muscle spasm of back: Secondary | ICD-10-CM | POA: Diagnosis not present

## 2023-01-01 DIAGNOSIS — E669 Obesity, unspecified: Secondary | ICD-10-CM | POA: Diagnosis not present

## 2023-01-01 DIAGNOSIS — I10 Essential (primary) hypertension: Secondary | ICD-10-CM | POA: Diagnosis not present

## 2023-01-01 DIAGNOSIS — E291 Testicular hypofunction: Secondary | ICD-10-CM | POA: Diagnosis not present

## 2023-01-01 DIAGNOSIS — R7301 Impaired fasting glucose: Secondary | ICD-10-CM | POA: Diagnosis not present

## 2023-01-01 DIAGNOSIS — D751 Secondary polycythemia: Secondary | ICD-10-CM | POA: Diagnosis not present

## 2023-01-01 DIAGNOSIS — K219 Gastro-esophageal reflux disease without esophagitis: Secondary | ICD-10-CM | POA: Diagnosis not present

## 2023-01-08 DIAGNOSIS — I10 Essential (primary) hypertension: Secondary | ICD-10-CM | POA: Diagnosis not present

## 2023-01-08 DIAGNOSIS — R7303 Prediabetes: Secondary | ICD-10-CM | POA: Diagnosis not present

## 2023-01-08 DIAGNOSIS — E291 Testicular hypofunction: Secondary | ICD-10-CM | POA: Diagnosis not present

## 2023-01-08 DIAGNOSIS — D751 Secondary polycythemia: Secondary | ICD-10-CM | POA: Diagnosis not present

## 2023-01-08 DIAGNOSIS — E669 Obesity, unspecified: Secondary | ICD-10-CM | POA: Diagnosis not present

## 2023-01-08 DIAGNOSIS — K219 Gastro-esophageal reflux disease without esophagitis: Secondary | ICD-10-CM | POA: Diagnosis not present

## 2023-01-13 DIAGNOSIS — M62830 Muscle spasm of back: Secondary | ICD-10-CM | POA: Diagnosis not present

## 2023-01-13 DIAGNOSIS — M47812 Spondylosis without myelopathy or radiculopathy, cervical region: Secondary | ICD-10-CM | POA: Diagnosis not present

## 2023-01-13 DIAGNOSIS — G894 Chronic pain syndrome: Secondary | ICD-10-CM | POA: Diagnosis not present

## 2023-01-13 DIAGNOSIS — F419 Anxiety disorder, unspecified: Secondary | ICD-10-CM | POA: Diagnosis not present

## 2023-02-10 DIAGNOSIS — M47812 Spondylosis without myelopathy or radiculopathy, cervical region: Secondary | ICD-10-CM | POA: Diagnosis not present

## 2023-02-10 DIAGNOSIS — G894 Chronic pain syndrome: Secondary | ICD-10-CM | POA: Diagnosis not present

## 2023-02-10 DIAGNOSIS — M62830 Muscle spasm of back: Secondary | ICD-10-CM | POA: Diagnosis not present

## 2023-02-10 DIAGNOSIS — F419 Anxiety disorder, unspecified: Secondary | ICD-10-CM | POA: Diagnosis not present

## 2023-02-19 DIAGNOSIS — M7552 Bursitis of left shoulder: Secondary | ICD-10-CM | POA: Diagnosis not present

## 2023-02-22 ENCOUNTER — Emergency Department (HOSPITAL_BASED_OUTPATIENT_CLINIC_OR_DEPARTMENT_OTHER)
Admission: EM | Admit: 2023-02-22 | Discharge: 2023-02-22 | Disposition: A | Discharge status: Home / Self Care | Payer: No Typology Code available for payment source | Attending: Emergency Medicine | Admitting: Emergency Medicine

## 2023-02-22 ENCOUNTER — Emergency Department (HOSPITAL_BASED_OUTPATIENT_CLINIC_OR_DEPARTMENT_OTHER): Payer: No Typology Code available for payment source

## 2023-02-22 ENCOUNTER — Encounter (HOSPITAL_BASED_OUTPATIENT_CLINIC_OR_DEPARTMENT_OTHER): Payer: Self-pay

## 2023-02-22 DIAGNOSIS — R0789 Other chest pain: Secondary | ICD-10-CM | POA: Diagnosis present

## 2023-02-22 DIAGNOSIS — R739 Hyperglycemia, unspecified: Secondary | ICD-10-CM | POA: Diagnosis not present

## 2023-02-22 DIAGNOSIS — I10 Essential (primary) hypertension: Secondary | ICD-10-CM | POA: Insufficient documentation

## 2023-02-22 DIAGNOSIS — J45909 Unspecified asthma, uncomplicated: Secondary | ICD-10-CM | POA: Insufficient documentation

## 2023-02-22 DIAGNOSIS — D751 Secondary polycythemia: Secondary | ICD-10-CM | POA: Diagnosis not present

## 2023-02-22 DIAGNOSIS — Z79899 Other long term (current) drug therapy: Secondary | ICD-10-CM | POA: Insufficient documentation

## 2023-02-22 LAB — CBC
HCT: 57.4 % — ABNORMAL HIGH (ref 39.0–52.0)
Hemoglobin: 19.5 g/dL — ABNORMAL HIGH (ref 13.0–17.0)
MCH: 28.6 pg (ref 26.0–34.0)
MCHC: 34 g/dL (ref 30.0–36.0)
MCV: 84.3 fL (ref 80.0–100.0)
Platelets: 215 10*3/uL (ref 150–400)
RBC: 6.81 MIL/uL — ABNORMAL HIGH (ref 4.22–5.81)
RDW: 15.9 % — ABNORMAL HIGH (ref 11.5–15.5)
WBC: 8.2 10*3/uL (ref 4.0–10.5)
nRBC: 0 % (ref 0.0–0.2)

## 2023-02-22 LAB — BASIC METABOLIC PANEL
Anion gap: 7 (ref 5–15)
BUN: 21 mg/dL — ABNORMAL HIGH (ref 6–20)
CO2: 26 mmol/L (ref 22–32)
Calcium: 9 mg/dL (ref 8.9–10.3)
Chloride: 104 mmol/L (ref 98–111)
Creatinine, Ser: 0.87 mg/dL (ref 0.61–1.24)
GFR, Estimated: >60 mL/min (ref >60)
Glucose, Bld: 132 mg/dL — ABNORMAL HIGH (ref 70–99)
Potassium: 3.9 mmol/L (ref 3.5–5.1)
Sodium: 137 mmol/L (ref 135–145)

## 2023-02-22 LAB — TROPONIN I (HIGH SENSITIVITY)
Troponin I (High Sensitivity): 4 ng/L (ref <18)
Troponin I (High Sensitivity): 4 ng/L (ref <18)

## 2023-02-22 MED ORDER — IOHEXOL 350 MG/ML SOLN
100.0000 mL | Freq: Once | INTRAVENOUS | Status: AC | PRN
  Administered 2023-02-22: 100 mL via INTRAVENOUS

## 2023-02-22 NOTE — ED Triage Notes (Signed)
He c/o "problems controlling my blood pressure for about a week". He is here today d/t exertional dyspnea with "pressure in my chest whenever I walk a  lot". He is ambulatory and in no distress. He denies any pain at present.

## 2023-02-22 NOTE — ED Provider Notes (Signed)
[place] EMERGENCY DEPARTMENT AT [facility] Provider Note   CSN: [id number] Arrival date & time: 02/22/23  4098     History  Chief Complaint  Patient presents with   Chest Pain    Jesus Brown is a 57 y.o. male.  With history of hypertension, GERD, asthma, anxiety presenting to the ED for evaluation of chest pain.  He states he has had trouble controlling his blood pressure for the past week.  He reports compliance with all his medications and has not missed any doses.  Typical blood pressures 120 systolic.  Blood pressure has been about 180 systolic lately.  This morning he developed exertional substernal chest tightness and a sharp and stabbing pain to the left upper back that is also exertional.  Symptoms improved with rest.  He denies any shortness of breath or cough.  No history of DVT or PE.  No fevers or chills.  No nausea or vomiting or diaphoresis.  No calf pain or abnormal swelling.  Patient does report chronic neck pain and pain in other places due to previous surgeries but states that this upper back pain is different than any pain that he has had in the past.   Chest Pain      Home Medications Prior to Admission medications   Medication Sig Start Date End Date Taking? Authorizing Provider  ANDROGEL PUMP 20.25 MG/ACT (1.62%) GEL  09/13/17   [provider]  EPINEPHrine (EPIPEN 2-PAK) 0.3 mg/0.3 mL IJ SOAJ injection Inject 0.3 mg into the muscle as needed (allergic reaction).    [provider]  loratadine (CLARITIN) 10 MG tablet Take 10 mg by mouth daily.    [provider]  Morphine Sulfate Beads 45 MG CP24 Take 1 capsule by mouth daily. 01/06/20   [provider]  omeprazole (PRILOSEC) 20 MG capsule Take 20 mg by mouth daily.     [provider]  PRESCRIPTION MEDICATION Inject 2 Doses as directed once a week. Allergy shots    [provider]  valsartan (DIOVAN) 160 MG tablet Take 160 mg by mouth daily.   08/17/15   [provider]      Allergies    Adhesive [tape], Baclofen, and Indocin [indomethacin]    Review of Systems   Review of Systems  Cardiovascular:  Positive for chest pain.  All other systems reviewed and are negative.   Physical Exam Updated Vital Signs BP (!) 142/77 (BP Location: Right Arm)   Pulse 94   Temp 98.1 F (36.7 C) (Oral)   Resp 18   SpO2 96%  Physical Exam Vitals and nursing note reviewed.  Constitutional:      General: He is not in acute distress.    Appearance: He is well-developed. He is not ill-appearing, toxic-appearing or diaphoretic.     Comments: Resting comfortably in bed  HENT:     Head: Normocephalic and atraumatic.  Eyes:     Conjunctiva/sclera: Conjunctivae normal.  Cardiovascular:     Rate and Rhythm: Normal rate and regular rhythm.     Pulses:          Radial pulses are 2+ on the right side and 2+ on the left side.     Heart sounds: No murmur heard. Pulmonary:     Effort: Pulmonary effort is normal. No respiratory distress.     Breath sounds: Normal breath sounds. No decreased breath sounds, wheezing, rhonchi or rales.  Abdominal:     Palpations: Abdomen is soft.  Tenderness: There is no abdominal tenderness.  Musculoskeletal:        General: No swelling.     Cervical back: Neck supple.     Right lower leg: No edema.     Left lower leg: No edema.  Skin:    General: Skin is warm and dry.     Capillary Refill: Capillary refill takes less than 2 seconds.  Neurological:     General: No focal deficit present.     Mental Status: He is alert and oriented to person, place, and time.  Psychiatric:        Mood and Affect: Mood normal.     ED Results / Procedures / Treatments   Labs (all labs ordered are listed, but only abnormal results are displayed) Labs Reviewed  BASIC METABOLIC PANEL - Abnormal; Notable for the following components:      Result Value   Glucose, Bld 132 (*)    BUN 21 (*)    All other components  within normal limits  CBC - Abnormal; Notable for the following components:   RBC 6.81 (*)    Hemoglobin 19.5 (*)    HCT 57.4 (*)    RDW 15.9 (*)    All other components within normal limits  TROPONIN I (HIGH SENSITIVITY)  TROPONIN I (HIGH SENSITIVITY)    EKG EKG Interpretation Date/Time:  Saturday February 22 2023 09:35:47 EST Ventricular Rate:  109 PR Interval:  183 QRS Duration:  92 QT Interval:  307 QTC Calculation: 414 R Axis:   108  Text Interpretation: Sinus tachycardia Biatrial enlargement Right axis deviation Abnormal R-wave progression, late transition No significant change since last tracing Confirmed by Benjiman Core [phone]) on 02/22/2023 11:40:00 AM  Radiology CT Angio Chest/Abd/Pel for Dissection W and/or W/WO  Result Date: 02/22/2023 CLINICAL DATA:  Acute aortic syndrome (AAS) suspected EXAM: CT ANGIOGRAPHY CHEST, ABDOMEN AND PELVIS TECHNIQUE: Non-contrast CT of the chest was initially obtained. Multidetector CT imaging through the chest, abdomen and pelvis was performed using the standard protocol during bolus administration of intravenous contrast. Multiplanar reconstructed images and MIPs were obtained and reviewed to evaluate the vascular anatomy. RADIATION DOSE REDUCTION: This exam was performed according to the departmental dose-optimization program which includes automated exposure control, adjustment of the mA and/or kV according to patient size and/or use of iterative reconstruction technique. CONTRAST:  OMNIPAQUE IOHEXOL 350 MG/ML SOLN COMPARISON:  CT AP, 03/14/2020 and 05/15/2008. FINDINGS: CTA CHEST FINDINGS Cardiovascular: Preferential opacification of the thoracic aorta. No evidence of thoracic aortic aneurysm or dissection. Mild burden of calcified coronary atherosclerosis. Normal heart size. No pericardial effusion. Mediastinum/Nodes: No enlarged mediastinal, hilar, or axillary lymph nodes. Thyroid gland, trachea, and esophagus demonstrate no  significant findings. Lungs/Pleura: Lungs are clear without focal consolidation, mass or suspicious pulmonary nodule. No pleural effusion or pneumothorax. Musculoskeletal: No acute chest wall abnormality. Review of the MIP images confirms the above findings. CTA ABDOMEN AND PELVIS FINDINGS VASCULAR Aorta: Normal caliber aorta without aneurysm, dissection, vasculitis or significant stenosis. Celiac: Patent without evidence of aneurysm, dissection, vasculitis or significant stenosis. SMA: Patent without evidence of aneurysm, dissection, vasculitis or significant stenosis. Renals: Double (main and accessory) renal arteries are present bilaterally. All renal arteries are patent without evidence of aneurysm, dissection, vasculitis, fibromuscular dysplasia or significant stenosis. IMA: Patent without evidence of aneurysm, dissection, vasculitis or significant stenosis. Pelvis: Patent without evidence of aneurysm, dissection, vasculitis or significant stenosis. Veins: No obvious venous abnormality within the limitations of this arterial phase study. Review of  the MIP images confirms the above findings. NON-VASCULAR Hepatobiliary: No focal liver abnormality is seen. No gallstones, gallbladder wall thickening, or biliary dilatation. Pancreas: No pancreatic ductal dilatation or surrounding inflammatory changes. Spleen: Normal in size without focal abnormality. Adrenals/Urinary Tract: Adrenal glands are unremarkable. Kidneys are normal, without renal calculi, focal lesion, or hydronephrosis. Bladder is unremarkable. Stomach/Bowel: Stomach is within normal limits. Appendix appears normal. No evidence of bowel wall thickening, distention, or inflammatory changes. Lymphatic: No enlarged abdominal or pelvic lymph nodes. Reproductive: Prostate is unremarkable. Other: Rotund abdomen. Scattered anterior abdominal wall subcutaneous contusions, likely injection sites. No abdominal wall hernia. No abdominopelvic ascites.  Musculoskeletal: No acute or significant osseous findings. L3-L5 PLIF. Review of the MIP images confirms the above findings. IMPRESSION: No acute vascular or nonvascular abnormality within the chest, abdomen or pelvis. Incidental, chronic and senescent findings as above. Electronically Signed   By: Roanna Banning M.D.   On: 02/22/2023 11:26    Procedures Procedures    Medications Ordered in ED Medications  iohexol (OMNIPAQUE) 350 MG/ML injection 100 mL (100 mLs Intravenous Contrast Given 02/22/23 1053)    ED Course/ Medical Decision Making/ A&P             HEART Score: 3                    Medical Decision Making Amount and/or Complexity of Data Reviewed Labs: ordered. Radiology: ordered.  Risk Prescription drug management.  This patient presents to the ED for concern of chest pain, this involves an extensive number of treatment options, and is a complaint that carries with it a high risk of complications and morbidity. The emergent differential diagnosis of chest pain includes: Acute coronary syndrome, pericarditis, aortic dissection, pulmonary embolism, tension pneumothorax, and esophageal rupture.  other urgent/non-acute considerations include, but are not limited to: chronic angina, aortic stenosis, cardiomyopathy, myocarditis, mitral valve prolapse, pulmonary hypertension, hypertrophic obstructive cardiomyopathy (HOCM), aortic insufficiency, right ventricular hypertrophy, pneumonia, pleuritis, bronchitis, pneumothorax, tumor, gastroesophageal reflux disease (GERD), esophageal spasm, Mallory-Weiss syndrome, peptic ulcer disease, biliary disease, pancreatitis, functional gastrointestinal pain, cervical or thoracic disk disease or arthritis, shoulder arthritis, costochondritis, subacromial bursitis, anxiety or panic attack, herpes zoster, breast disorders, chest wall tumors, thoracic outlet syndrome, mediastinitis.  My initial workup includes ACS rule out, CT dissection  study  Additional history obtained from: Nursing notes from this visit.  I ordered, reviewed and interpreted labs which include: CBC, BMP, troponin.  Hyperglycemia of 132.  Polycythemia with a hemoglobin of 19.5.  No leukocytosis.  Initial delta troponin negative.  I ordered imaging studies including CT dissection study I independently visualized and interpreted imaging which showed negative for acute abnormalities I agree with the radiologist interpretation  Cardiac Monitoring:  The patient was maintained on a cardiac monitor.  I personally viewed and interpreted the cardiac monitored which showed an underlying rhythm of: Sinus tachycardia  Afebrile, borderline tachycardic but otherwise hemodynamically stable.  57 year old male presenting for evaluation of chest pain.  This began this morning.  Also complains of uncontrolled hypertension over the past week.  Reports compliance with his medications.  Initially hypertensive to 182/101.  This improved to 128/70 without intervention in the emergency department.  His pain is typically exertional and improves with rest.  He has not had any pain while in the emergency department.  EKG without acute ischemic changes.  CT dissection study negative.  Initial delta troponin negative.  Heart score of 3.  Given the fact that his symptoms are exertional,  believe patient would benefit from urgent cardiology evaluation.  Referral order was placed.  Patient also has polycythemia with a hemoglobin of 19.5.  He states he has had similar symptoms when he had polycythemia in the past but was told that it was due to his testosterone supplementation at that time.  He is not currently on testosterone supplementation.  Will send referral to hematology for this.  He was given strict return precautions.  Stable at discharge.  At this time there does not appear to be any evidence of an acute emergency medical condition and the patient appears stable for discharge with  appropriate outpatient follow up. Diagnosis was discussed with patient who verbalizes understanding of care plan and is agreeable to discharge. I have discussed return precautions with patient who verbalizes understanding. Patient encouraged to follow-up with their PCP within 1 week. All questions answered.  Patient's case discussed with Dr. Rubin Payor who agrees with plan to discharge with follow-up.   Note: Portions of this report may have been transcribed using voice recognition software. Every effort was made to ensure accuracy; however, inadvertent computerized transcription errors may still be present.        Final Clinical Impression(s) / ED Diagnoses Final diagnoses:  Atypical chest pain  Polycythemia    Rx / DC Orders ED Discharge Orders          Ordered    Ambulatory referral to Cardiology       Comments: If you have not heard from the Cardiology office within the next 72 hours please call [phone].   02/22/23 1308    Ambulatory referral to Hematology / Oncology       Comments: Your emergency department provider has referred you to see a hematology/oncology specialist. These are physicians who specialize in blood disorders and cancers, or findings concerning for cancer. You will receive a phone call from the [facility] Office to set up your appointment within 2 business days: [employer] operate Mon - Fri, 8:00 a.m. to 5:00 p.m.; closed for federally recognized holidays. Please be sure your phone is not set to block numbers during this time.   02/22/23 1308              Michelle Piper, PA-C 02/22/23 1318    Benjiman Core, MD 02/22/23 1515

## 2023-02-22 NOTE — Discharge Instructions (Signed)
You have been seen today for your complaint of chest pain. Your lab work showed an elevated hemoglobin level but was otherwise reassuring. Your imaging was reassuring and showed no acute abnormalities. Follow up with: Cardiology.  They should call you to schedule an appointment. Hematology.  They should also call you to schedule an appointment. Please seek immediate medical care if you develop any of the following symptoms: Your chest pain gets worse. You have a cough that gets worse, or you cough up blood. You have severe pain in your abdomen. You faint. You have sudden, unexplained chest discomfort. You have sudden, unexplained discomfort in your arms, back, neck, or jaw. You have shortness of breath at any time. You suddenly start to sweat, or your skin gets clammy. You feel nausea or you vomit. You suddenly feel lightheaded or dizzy. You have severe weakness, or unexplained weakness or fatigue. Your heart begins to beat quickly, or it feels like it is skipping beats. At this time there does not appear to be the presence of an emergent medical condition, however there is always the potential for conditions to change. Please read and follow the below instructions.  Do not take your medicine if  develop an itchy rash, swelling in your mouth or lips, or difficulty breathing; call 911 and seek immediate emergency medical attention if this occurs.  You may review your lab tests and imaging results in their entirety on your MyChart account.  Please discuss all results of fully with your primary care provider and other specialist at your follow-up visit.  Note: Portions of this text may have been transcribed using voice recognition software. Every effort was made to ensure accuracy; however, inadvertent computerized transcription errors may still be present.

## 2023-02-22 NOTE — ED Notes (Signed)
Pt discharged home after verbalizing understanding of discharge instructions; nad noted. 

## 2023-03-10 DIAGNOSIS — M62830 Muscle spasm of back: Secondary | ICD-10-CM | POA: Diagnosis not present

## 2023-03-10 DIAGNOSIS — M47812 Spondylosis without myelopathy or radiculopathy, cervical region: Secondary | ICD-10-CM | POA: Diagnosis not present

## 2023-03-10 DIAGNOSIS — F419 Anxiety disorder, unspecified: Secondary | ICD-10-CM | POA: Diagnosis not present

## 2023-03-10 DIAGNOSIS — G894 Chronic pain syndrome: Secondary | ICD-10-CM | POA: Diagnosis not present

## 2023-03-14 ENCOUNTER — Telehealth: Payer: Self-pay

## 2023-03-14 NOTE — Telephone Encounter (Signed)
 Transition Care Management Unsuccessful Follow-up Telephone Call  Date of discharge and from where:  02/22/2023 [facility]  Attempts:  1st Attempt  Reason for unsuccessful TCM follow-up call:  Left voice message  Brigett Estell [facility]  [facility], [facility] Guide Direct Dial: [phone]  Website: [email or web address]

## 2023-03-25 ENCOUNTER — Inpatient Hospital Stay: Payer: PPO

## 2023-03-25 ENCOUNTER — Inpatient Hospital Stay: Payer: PPO | Attending: Oncology | Admitting: Oncology

## 2023-03-25 ENCOUNTER — Encounter: Payer: Self-pay | Admitting: Oncology

## 2023-03-25 VITALS — BP 140/84 | HR 90 | Temp 98.3°F | Resp 18 | Ht 68.0 in | Wt 252.3 lb

## 2023-03-25 DIAGNOSIS — I10 Essential (primary) hypertension: Secondary | ICD-10-CM | POA: Insufficient documentation

## 2023-03-25 DIAGNOSIS — Z79899 Other long term (current) drug therapy: Secondary | ICD-10-CM | POA: Diagnosis not present

## 2023-03-25 DIAGNOSIS — E349 Endocrine disorder, unspecified: Secondary | ICD-10-CM

## 2023-03-25 DIAGNOSIS — E291 Testicular hypofunction: Secondary | ICD-10-CM | POA: Insufficient documentation

## 2023-03-25 DIAGNOSIS — D751 Secondary polycythemia: Secondary | ICD-10-CM | POA: Insufficient documentation

## 2023-03-25 LAB — CBC WITH DIFFERENTIAL/PLATELET
Abs Immature Granulocytes: 0.02 10*3/uL (ref 0.00–0.07)
Basophils Absolute: 0.1 10*3/uL (ref 0.0–0.1)
Basophils Relative: 1 %
Eosinophils Absolute: 0.3 10*3/uL (ref 0.0–0.5)
Eosinophils Relative: 3 %
HCT: 51 % (ref 39.0–52.0)
Hemoglobin: 17.5 g/dL — ABNORMAL HIGH (ref 13.0–17.0)
Immature Granulocytes: 0 %
Lymphocytes Relative: 24 %
Lymphs Abs: 2.4 10*3/uL (ref 0.7–4.0)
MCH: 29.1 pg (ref 26.0–34.0)
MCHC: 34.3 g/dL (ref 30.0–36.0)
MCV: 84.9 fL (ref 80.0–100.0)
Monocytes Absolute: 0.7 10*3/uL (ref 0.1–1.0)
Monocytes Relative: 7 %
Neutro Abs: 6.4 10*3/uL (ref 1.7–7.7)
Neutrophils Relative %: 65 %
Platelets: 213 10*3/uL (ref 150–400)
RBC: 6.01 MIL/uL — ABNORMAL HIGH (ref 4.22–5.81)
RDW: 14.5 % (ref 11.5–15.5)
WBC: 9.9 10*3/uL (ref 4.0–10.5)
nRBC: 0 % (ref 0.0–0.2)

## 2023-03-25 LAB — COMPREHENSIVE METABOLIC PANEL
ALT: 37 U/L (ref 0–44)
AST: 26 U/L (ref 15–41)
Albumin: 4.1 g/dL (ref 3.5–5.0)
Alkaline Phosphatase: 69 U/L (ref 38–126)
Anion gap: 8 (ref 5–15)
BUN: 18 mg/dL (ref 6–20)
CO2: 27 mmol/L (ref 22–32)
Calcium: 9 mg/dL (ref 8.9–10.3)
Chloride: 105 mmol/L (ref 98–111)
Creatinine, Ser: 0.99 mg/dL (ref 0.61–1.24)
GFR, Estimated: >60 mL/min (ref >60)
Glucose, Bld: 116 mg/dL — ABNORMAL HIGH (ref 70–99)
Potassium: 3.8 mmol/L (ref 3.5–5.1)
Sodium: 140 mmol/L (ref 135–145)
Total Bilirubin: 0.7 mg/dL (ref <1.2)
Total Protein: 6.8 g/dL (ref 6.5–8.1)

## 2023-03-25 LAB — FERRITIN: Ferritin: 44 ng/mL (ref 24–336)

## 2023-03-25 LAB — LACTATE DEHYDROGENASE: LDH: 191 U/L (ref 98–192)

## 2023-03-25 NOTE — Assessment & Plan Note (Signed)
History of elevated red blood cell count, previously managed with phlebotomies at the [facility]. Recent hemoglobin 19.5, hematocrit 57.4. Patient reports using AndroGel, which may contribute to elevated red cell count. Reports occasional severe headaches.  -Order erythropoietin hormone level and JAK2 mutation panel to assess for potential primary polycythemia.  -Continue phlebotomies as needed to maintain hematocrit below 55 to reduce risk of blood clots and other complications.  -He recently donated blood at [facility].  Labs today revealed hemoglobin of 17.5, hematocrit 51.  White count, platelet count, CMP, ferritin, LDH were all within normal limits.  -Since he could have secondary polycythemia from AndroGel use, we will aim to keep hematocrit below 55 to reduce hyperviscosity related complications.  No indication for therapeutic lobotomy today.  -Follow-up in 4 weeks.

## 2023-03-25 NOTE — Progress Notes (Signed)
[facility]  HEMATOLOGY CLINIC CONSULTATION NOTE    PATIENT NAME: Jesus Brown   MR#: [id number] DOB: [birth year]  DATE OF SERVICE: 03/25/2023   REFERRING PHYSICIAN  Derrek Gu, PA-C, Emergency medicine  Patient Care Team: Tessa Lerner, DO as PCP - General (Cardiology)    REASON FOR CONSULTATION/ CHIEF COMPLAINT: Erythrocytosis  HISTORY OF PRESENTING ILLNESS:   Jesus Brown is a 57 y.o. gentleman with past medical history of hypertension, arthritis, chronic back pain, hypogonadism, was referred to our clinic for evaluation of elevated hemoglobin.  Discussed the use of AI scribe software for clinical note transcription with the patient, who gave verbal consent to proceed.   He presented to the ED on 02/22/2023 with complaints of chest pain.  Cardiac workup was grossly unremarkable.  Labs at that time revealed hemoglobin of 19.5, hematocrit 57.4, MCV 84.3.  White count and platelet count were normal.  A referral was sent to [place] for further evaluation.  Patient was apparently diagnosed with polycythemia several years ago and was on therapeutic phlebotomies up until 2017.  He was lost to follow-up after that.  He reports previously donating blood every three to four months at the [facility], but recently his red cell count was too high for donation. He denies smoking and drinking. He previously used testosterone injections, which elevated his red cell count, and has since switched to a topical AndroGel pump, which he applies to his shoulders daily. He reports feeling generally well, but does experience severe headaches. He denies dizziness, but notes occasional chest tightness after receiving a cortisone shot in his shoulder. He also reports some itching, but no rashes. He sleeps well and has not been diagnosed with sleep apnea. He has seen a cardiologist in the past for a heart murmur, which was deemed not concerning. He has a long-term swelling in right leg, which  has been evaluated multiple times with ultrasound for clot, with negative results.  There is no prior diagnosis of obstructive sleep apnea. The patient denies weight loss or skin itching.   MEDICAL HISTORY:  Past Medical History:  Diagnosis Date   Anxiety    mainly r/t pain   Arthritis    neck, knees   Asthma    AS CHILD    Back pain, chronic    DDD (degenerative disc disease)    Dysphagia    Erythrocytosis due to endocrine disorders 09/08/2015   GERD (gastroesophageal reflux disease)    Hypertension    Hypotestosteronemia 09/08/2015   Neck pain, chronic     SURGICAL HISTORY: Past Surgical History:  Procedure Laterality Date   ANKLE SURGERY     BACK SURGERY  2010,2011   lumb fusionx2   CERVICAL FUSION  [id number]   3 surgeries   DIRECT LARYNGOSCOPY N/A 11/09/2012   Procedure: DIRECT LARYNGOSCOPY;  Surgeon: Flo Shanks, MD;  Location: [facility];  Service: ENT;  Laterality: N/A;   ESOPHAGOGASTRODUODENOSCOPY N/A 08/03/2012   Procedure: ESOPHAGOGASTRODUODENOSCOPY (EGD);  Surgeon: Willis Modena, MD;  Location: [facility] END[facility]COPY;  Service: Endoscopy;  Laterality: N/A;   ESOPHAGOSCOPY W/ BOTOX INJECTION N/A 11/09/2012   Procedure: ESOPHAGOSCOPY POSSIBLE BIOPSY AND POSSIBLE BOTOX INJECTION CRICOPHARYNGEUS;  Surgeon: Flo Shanks, MD;  Location: [facility];  Service: ENT;  Laterality: N/A;  Esophagoscopy with botox injection   HARDWARE REMOVAL Right 12/19/2017   Procedure: REMOVAL OF SMALL FRAGMENT PLATE AND SCREWS RIGHT LATERAL ANKLE;  Surgeon: Kerrin Champagne, MD;  Location:  SURGERY CENTER;  Service: Orthopedics;  Laterality: Right;   HERNIA REPAIR     ing-rt   KNEE SURGERY     rt and left x2 each=4   POSTERIOR CERVICAL FUSION/FORAMINOTOMY N/A 03/25/2014   Procedure: LEFT C7-T1 FORAMINOTOMY;  Surgeon: Kerrin Champagne, MD;  Location: [facility] OR;  Service: Orthopedics;  Laterality: N/A;   SHOULDER SURGERY  1999   rt and lt   TONSILLECTOMY     TOTAL KNEE  ARTHROPLASTY Left 07/19/2016   Procedure: LEFT TOTAL KNEE ARTHROPLASTY CORTISON INJECTION IN RIGHT KNEE;  Surgeon: Kerrin Champagne, MD;  Location: [facility] OR;  Service: Orthopedics;  Laterality: Left;  MAY NEED RNFA TO FINISH CASE PER SHERRIE    TOTAL KNEE ARTHROPLASTY Right 04/11/2017   Procedure: RIGHT TOTAL KNEE ARTHROPLASTY;  Surgeon: Kerrin Champagne, MD;  Location: [facility] OR;  Service: Orthopedics;  Laterality: Right;   WRIST SURGERY     lt and rt    SOCIAL HISTORY: Social History   Socioeconomic History   Marital status: Married    Spouse name: Not on file   Number of children: 2   Years of education: Not on file   Highest education level: Not on file  Occupational History   Not on file  Tobacco Use   Smoking status: Never   Smokeless tobacco: Never  Vaping Use   Vaping status: Never Used  Substance and Sexual Activity   Alcohol use: No   Drug use: No   Sexual activity: Not on file  Other Topics Concern   Not on file  Social History Narrative   Not on file   Social Drivers of Health   Financial Resource Strain: Not on file  Food Insecurity: Not on file  Transportation Needs: Not on file  Physical Activity: Not on file  Stress: Not on file  Social Connections: Not on file  Intimate Partner Violence: Not on file    FAMILY HISTORY: Family History  Problem Relation Age of Onset   Hypertension Mother    Alzheimer's disease Mother    Heart disease Father    Heart attack Father    COPD Father    Glaucoma Sister    Scoliosis Sister     CURRENT MEDICATIONS   Current Outpatient Medications  Medication Instructions   ANDROGEL PUMP 20.25 MG/ACT (1.62%) GEL No dose, route, or frequency recorded.   EpiPen 2-Pak 0.3 mg, Intramuscular, As needed   loratadine (CLARITIN) 10 mg, Daily   Morphine Sulfate Beads 45 MG CP24 1 capsule, Oral, Daily   omeprazole (PRILOSEC) 20 mg, Oral, Daily   PRESCRIPTION MEDICATION 2 Doses, Injection, Weekly, Allergy shots   valsartan (DIOVAN) 160 mg,  Oral, Daily     ALLERGIES  He is allergic to adhesive [tape], baclofen, and indocin [indomethacin].  REVIEW OF SYSTEMS:    Review of Systems - Oncology  All of the other pertinent systems were reviewed with the patient and were unremarkable except as mentioned above in HPI.  PHYSICAL EXAMINATION:  ECOG PERFORMANCE STATUS: 1 - Symptomatic but completely ambulatory  Vitals:   03/25/23 1303  BP: (!) 140/84  Pulse: 90  Resp: 18  Temp: 98.3 F (36.8 C)  SpO2: 98%   Filed Weights   03/25/23 1303  Weight: 252 lb 4.8 oz (114.4 kg)    Physical Exam Constitutional:      General: He is not in acute distress.    Appearance: Normal appearance.  HENT:     Head: Normocephalic and atraumatic.  Eyes:  General: No scleral icterus.    Conjunctiva/sclera: Conjunctivae normal.  Cardiovascular:     Rate and Rhythm: Normal rate and regular rhythm.     Heart sounds: Normal heart sounds.  Pulmonary:     Effort: Pulmonary effort is normal.     Breath sounds: Normal breath sounds.  Abdominal:     General: There is no distension.     Comments: Obese abdomen  Musculoskeletal:     Right lower leg: No edema.     Left lower leg: No edema.  Neurological:     General: No focal deficit present.     Mental Status: He is alert and oriented to person, place, and time.  Psychiatric:        Mood and Affect: Mood normal.        Behavior: Behavior normal.        Thought Content: Thought content normal.     LABORATORY DATA:   I have reviewed the data as listed.  Results for orders placed or performed in visit on 03/25/23  Ferritin  Result Value Ref Range   Ferritin 44 24 - 336 ng/mL  Lactate dehydrogenase  Result Value Ref Range   LDH 191 98 - 192 U/L  Comprehensive metabolic panel  Result Value Ref Range   Sodium 140 135 - 145 mmol/L   Potassium 3.8 3.5 - 5.1 mmol/L   Chloride 105 98 - 111 mmol/L   CO2 27 22 - 32 mmol/L   Glucose, Bld 116 (H) 70 - 99 mg/dL   BUN 18 6 - 20  mg/dL   Creatinine, Ser 1.61 0.61 - 1.24 mg/dL   Calcium 9.0 8.9 - 09.6 mg/dL   Total Protein 6.8 6.5 - 8.1 g/dL   Albumin 4.1 3.5 - 5.0 g/dL   AST 26 15 - 41 U/L   ALT 37 0 - 44 U/L   Alkaline Phosphatase 69 38 - 126 U/L   Total Bilirubin 0.7 <1.2 mg/dL   GFR, Estimated >04 >54 mL/min   Anion gap 8 5 - 15  CBC with Differential/Platelet  Result Value Ref Range   WBC 9.9 4.0 - 10.5 K/uL   RBC 6.01 (H) 4.22 - 5.81 MIL/uL   Hemoglobin 17.5 (H) 13.0 - 17.0 g/dL   HCT 09.8 11.9 - 14.7 %   MCV 84.9 80.0 - 100.0 fL   MCH 29.1 26.0 - 34.0 pg   MCHC 34.3 30.0 - 36.0 g/dL   RDW 82.9 56.2 - 13.0 %   Platelets 213 150 - 400 K/uL   nRBC 0.0 0.0 - 0.2 %   Neutrophils Relative % 65 %   Neutro Abs 6.4 1.7 - 7.7 K/uL   Lymphocytes Relative 24 %   Lymphs Abs 2.4 0.7 - 4.0 K/uL   Monocytes Relative 7 %   Monocytes Absolute 0.7 0.1 - 1.0 K/uL   Eosinophils Relative 3 %   Eosinophils Absolute 0.3 0.0 - 0.5 K/uL   Basophils Relative 1 %   Basophils Absolute 0.1 0.0 - 0.1 K/uL   Immature Granulocytes 0 %   Abs Immature Granulocytes 0.02 0.00 - 0.07 K/uL    RADIOGRAPHIC STUDIES:  No recent pertinent imaging studies available to review.  ASSESSMENT & PLAN:  57 y.o. gentleman with past medical history of hypertension, arthritis, chronic back pain, hypogonadism, was referred to our clinic for evaluation of elevated hemoglobin.  Polycythemia History of elevated red blood cell count, previously managed with phlebotomies at the [facility]. Recent hemoglobin 19.5, hematocrit 57.4.  Patient reports using AndroGel, which may contribute to elevated red cell count. Reports occasional severe headaches.  -Order erythropoietin hormone level and JAK2 mutation panel to assess for potential primary polycythemia.  -Continue phlebotomies as needed to maintain hematocrit below 55 to reduce risk of blood clots and other complications.  -He recently donated blood at [facility].  Labs today revealed hemoglobin of  17.5, hematocrit 51.  White count, platelet count, CMP, ferritin, LDH were all within normal limits.  -Since he could have secondary polycythemia from AndroGel use, we will aim to keep hematocrit below 55 to reduce hyperviscosity related complications.  No indication for therapeutic lobotomy today.  -Follow-up in 4 weeks.  Hypotestosteronemia Patient uses AndroGel pump (2 pumps daily applied to shoulders). This may contribute to polycythemia. -Continue AndroGel as currently prescribed, monitor for potential contribution to polycythemia.   Orders Placed This Encounter  Procedures   CBC with Differential/Platelet    Standing Status:   Future    Number of Occurrences:   1    Expiration Date:   03/24/2024   Comprehensive metabolic panel    Standing Status:   Future    Number of Occurrences:   1    Expiration Date:   03/24/2024   Lactate dehydrogenase    Standing Status:   Future    Number of Occurrences:   1    Expiration Date:   03/24/2024   Ferritin    Standing Status:   Future    Number of Occurrences:   1    Expiration Date:   03/24/2024   Erythropoietin    Standing Status:   Future    Number of Occurrences:   1    Expiration Date:   03/24/2024    I spent a total of 55 minutes during this encounter with the patient including review of chart and various tests results, discussions about plan of care and coordination of care plan.  I reviewed lab results and outside records for this visit and discussed relevant results with the patient. Diagnosis, plan of care and treatment options were also discussed in detail with the patient. Opportunity provided to ask questions and answers provided to his apparent satisfaction. Provided instructions to call our clinic with any problems, questions or concerns prior to return visit. I recommended to continue follow-up with PCP and sub-specialists. He verbalized understanding and agreed with the plan. No barriers to learning was  detected.   Future Appointments  Date Time Provider Department Center  04/21/2023 10:15 AM [facility] PHLEBOTOMIST [facility] None  04/22/2023 11:40 AM Anyelin Mogle, Archie Patten, MD [facility] None  04/22/2023 12:45 PM [facility] INFUSION [facility] None  06/02/2023 10:40 AM Jodelle Red, MD [facility]     Meryl Crutch, MD  03/25/2023 4:28 PM  [facility] - A DEPT OF [facility] [address] [place] [address] Dept: [phone] Dept Fax: [phone]    This document was completed utilizing speech recognition software. Grammatical errors, random word insertions, pronoun errors, and incomplete sentences are an occasional consequence of this system due to software limitations, ambient noise, and hardware issues. Any formal questions or concerns about the content, text or information contained within the body of this dictation should be directly addressed to the provider for clarification.

## 2023-03-25 NOTE — Assessment & Plan Note (Signed)
Patient uses AndroGel pump (2 pumps daily applied to shoulders). This may contribute to polycythemia. -Continue AndroGel as currently prescribed, monitor for potential contribution to polycythemia.

## 2023-03-26 LAB — IRON AND TIBC
Iron: 104 ug/dL (ref 45–182)
Saturation Ratios: 28 % (ref 17.9–39.5)
TIBC: 370 ug/dL (ref 250–450)
UIBC: 266 ug/dL

## 2023-03-26 LAB — ERYTHROPOIETIN: Erythropoietin: 11.1 m[IU]/mL (ref 2.6–18.5)

## 2023-03-31 LAB — MISC LABCORP TEST (SEND OUT): Labcorp test code: 489514

## 2023-04-14 ENCOUNTER — Other Ambulatory Visit: Payer: Self-pay | Admitting: Oncology

## 2023-04-14 DIAGNOSIS — D751 Secondary polycythemia: Secondary | ICD-10-CM

## 2023-04-19 ENCOUNTER — Emergency Department (HOSPITAL_BASED_OUTPATIENT_CLINIC_OR_DEPARTMENT_OTHER)
Admission: EM | Admit: 2023-04-19 | Discharge: 2023-04-19 | Disposition: A | Discharge status: Home / Self Care | Payer: No Typology Code available for payment source | Attending: Emergency Medicine | Admitting: Emergency Medicine

## 2023-04-19 ENCOUNTER — Other Ambulatory Visit (HOSPITAL_BASED_OUTPATIENT_CLINIC_OR_DEPARTMENT_OTHER): Payer: Self-pay

## 2023-04-19 ENCOUNTER — Encounter (HOSPITAL_BASED_OUTPATIENT_CLINIC_OR_DEPARTMENT_OTHER): Payer: Self-pay

## 2023-04-19 ENCOUNTER — Encounter: Payer: Self-pay | Admitting: Oncology

## 2023-04-19 DIAGNOSIS — M109 Gout, unspecified: Secondary | ICD-10-CM | POA: Diagnosis not present

## 2023-04-19 DIAGNOSIS — M7989 Other specified soft tissue disorders: Secondary | ICD-10-CM | POA: Diagnosis present

## 2023-04-19 MED ORDER — PREDNISONE 50 MG PO TABS
60.0000 mg | ORAL_TABLET | Freq: Once | ORAL | Status: AC
  Administered 2023-04-19: 60 mg via ORAL
  Filled 2023-04-19: qty 1

## 2023-04-19 MED ORDER — PREDNISONE 10 MG PO TABS
40.0000 mg | ORAL_TABLET | Freq: Every day | ORAL | 0 refills | Status: DC
  Filled 2023-04-19: qty 20, 5d supply, fill #0

## 2023-04-19 NOTE — ED Provider Notes (Signed)
 Jesus Brown EMERGENCY DEPARTMENT AT [facility] Provider Note   CSN: [id number] Arrival date & time: 04/19/23  0941     History  Chief Complaint  Patient presents with   Leg Swelling    Jesus Brown is a 58 y.o. male.  Patient with redness and significant swelling at the base of the right great toe.  Patient states that he he has had this happen in the past.  Has had a tap before they did not actually see gout crystals.  Does have a family history of gout.  Things started to be painful little bit last evening and this morning it was all red and swollen.  Patient states she is got chronic swelling to his right calf is had ultrasounds of that to rule out DVT several times they have been negative.  Patient is a chronic pain patient.  Is on morphine  for that.  Patient not requesting any pain medicine here today.  There is been no history of fall or injury.  No wounds or cuts to that area.       Home Medications Prior to Admission medications   Medication Sig Start Date End Date Taking? Authorizing Provider  predniSONE  (DELTASONE ) 10 MG tablet Take 4 tablets (40 mg total) by mouth daily. 04/19/23  Yes Kelvin Burpee, MD  ANDROGEL  PUMP 20.25 MG/ACT (1.62%) GEL  09/13/17   [provider]  EPINEPHrine  (EPIPEN  2-PAK) 0.3 mg/0.3 mL IJ SOAJ injection Inject 0.3 mg into the muscle as needed (allergic reaction).    [provider]  loratadine  (CLARITIN ) 10 MG tablet Take 10 mg by mouth daily.    [provider]  Morphine  Sulfate Beads 45 MG CP24 Take 1 capsule by mouth daily. 01/06/20   [provider]  omeprazole (PRILOSEC) 20 MG capsule Take 20 mg by mouth daily.     [provider]  PRESCRIPTION MEDICATION Inject 2 Doses as directed once a week. Allergy shots    [provider]  valsartan (DIOVAN) 160 MG tablet Take 160 mg by mouth daily.  08/17/15   [provider]      Allergies    Adhesive [tape], Baclofen, and  Indocin [indomethacin]    Review of Systems   Review of Systems  Constitutional:  Negative for chills and fever.  HENT:  Negative for ear pain and sore throat.   Eyes:  Negative for pain and visual disturbance.  Respiratory:  Negative for cough and shortness of breath.   Cardiovascular:  Positive for leg swelling. Negative for chest pain and palpitations.  Gastrointestinal:  Negative for abdominal pain and vomiting.  Genitourinary:  Negative for dysuria and hematuria.  Musculoskeletal:  Positive for joint swelling. Negative for arthralgias and back pain.  Skin:  Negative for color change and rash.  Neurological:  Negative for seizures and syncope.  All other systems reviewed and are negative.   Physical Exam Updated Vital Signs BP (!) 157/100 (BP Location: Right Arm)   Pulse (!) 107   Temp 98.4 F (36.9 C) (Oral)   Resp (!) 21   SpO2 100%  Physical Exam Vitals and nursing note reviewed.  Constitutional:      General: He is not in acute distress.    Appearance: Normal appearance. He is well-developed.  HENT:     Head: Normocephalic and atraumatic.  Eyes:     Extraocular Movements: Extraocular movements intact.     Conjunctiva/sclera: Conjunctivae normal.     Pupils: Pupils are equal, round, and reactive  to light.  Cardiovascular:     Rate and Rhythm: Normal rate and regular rhythm.     Heart sounds: No murmur heard. Pulmonary:     Effort: Pulmonary effort is normal. No respiratory distress.     Breath sounds: Normal breath sounds.  Abdominal:     Palpations: Abdomen is soft.     Tenderness: There is no abdominal tenderness.  Musculoskeletal:        General: Swelling and tenderness present.     Cervical back: Neck supple.     Right lower leg: Edema present.     Comments: Swelling to the right calf.  No swelling to the right ankle.  Some swelling to the foot extensive erythema and increased warmth at the metatarsal phalangeal joint of the right great toe.  Good cap  refill.  Dorsalis pedis pulses 1+.  Skin:    General: Skin is warm and dry.     Capillary Refill: Capillary refill takes less than 2 seconds.  Neurological:     General: No focal deficit present.     Mental Status: He is alert and oriented to person, place, and time.  Psychiatric:        Mood and Affect: Mood normal.     ED Results / Procedures / Treatments   Labs (all labs ordered are listed, but only abnormal results are displayed) Labs Reviewed - No data to display  EKG None  Radiology No results found.  Procedures Procedures    Medications Ordered in ED Medications  predniSONE  (DELTASONE ) tablet 60 mg (has no administration in time range)    ED Course/ Medical Decision Making/ A&P                                 Medical Decision Making Risk Prescription drug management.   Findings consistent of acute gout flare.  Although not formally diagnosed in the past is consistent with it.  No fall no injury no wounds to the foot.  Will treat with crutches first dose of prednisone  here 60 mg and then continue prednisone  at home for 5 days.  Prescription provided.  Patient does not need a work note.   Final Clinical Impression(s) / ED Diagnoses Final diagnoses:  Acute gout involving toe of right foot, unspecified cause    Rx / DC Orders ED Discharge Orders          Ordered    predniSONE  (DELTASONE ) 10 MG tablet  Daily        04/19/23 1009              Greyson Peavy, MD 04/19/23 1013

## 2023-04-19 NOTE — Discharge Instructions (Addendum)
 Take the prednisone as directed.  Elevate the right foot is much as possible.  Use the crutches as needed.  Follow-up with your doctors.  This seems to fit into classic gout flare.

## 2023-04-19 NOTE — ED Triage Notes (Signed)
 He c/o non[traumatic swelling and ppain of right lower leg, and especially of foot; and more specifically pain and erythema at #1 m.t.p. joint. He tells me he has hx of this a couple of times; but they did an ultrasound and I didn't have a blood clot; and they also put in a needle and drew fluid out of my big toe, but there weren't any crystals.

## 2023-04-21 ENCOUNTER — Inpatient Hospital Stay: Payer: PPO | Attending: Oncology

## 2023-04-21 DIAGNOSIS — D751 Secondary polycythemia: Secondary | ICD-10-CM | POA: Insufficient documentation

## 2023-04-21 LAB — CBC WITH DIFFERENTIAL/PLATELET
Abs Immature Granulocytes: 0.03 10*3/uL (ref 0.00–0.07)
Basophils Absolute: 0 10*3/uL (ref 0.0–0.1)
Basophils Relative: 0 %
Eosinophils Absolute: 0.2 10*3/uL (ref 0.0–0.5)
Eosinophils Relative: 2 %
HCT: 49.6 % (ref 39.0–52.0)
Hemoglobin: 17 g/dL (ref 13.0–17.0)
Immature Granulocytes: 0 %
Lymphocytes Relative: 27 %
Lymphs Abs: 2.7 10*3/uL (ref 0.7–4.0)
MCH: 29.5 pg (ref 26.0–34.0)
MCHC: 34.3 g/dL (ref 30.0–36.0)
MCV: 86 fL (ref 80.0–100.0)
Monocytes Absolute: 0.6 10*3/uL (ref 0.1–1.0)
Monocytes Relative: 6 %
Neutro Abs: 6.5 10*3/uL (ref 1.7–7.7)
Neutrophils Relative %: 65 %
Platelets: 198 10*3/uL (ref 150–400)
RBC: 5.77 MIL/uL (ref 4.22–5.81)
RDW: 13.8 % (ref 11.5–15.5)
WBC: 10.1 10*3/uL (ref 4.0–10.5)
nRBC: 0 % (ref 0.0–0.2)

## 2023-04-22 ENCOUNTER — Inpatient Hospital Stay: Payer: PPO | Admitting: Oncology

## 2023-04-22 ENCOUNTER — Inpatient Hospital Stay: Payer: PPO

## 2023-04-22 ENCOUNTER — Other Ambulatory Visit: Payer: No Typology Code available for payment source

## 2023-04-28 ENCOUNTER — Inpatient Hospital Stay (HOSPITAL_BASED_OUTPATIENT_CLINIC_OR_DEPARTMENT_OTHER): Payer: PPO | Admitting: Oncology

## 2023-04-28 ENCOUNTER — Inpatient Hospital Stay: Payer: No Typology Code available for payment source

## 2023-04-28 ENCOUNTER — Encounter: Payer: Self-pay | Admitting: Oncology

## 2023-04-28 VITALS — BP 137/94 | HR 91 | Temp 98.8°F | Resp 18 | Ht 68.0 in | Wt 253.4 lb

## 2023-04-28 DIAGNOSIS — D751 Secondary polycythemia: Secondary | ICD-10-CM | POA: Diagnosis not present

## 2023-04-28 DIAGNOSIS — E349 Endocrine disorder, unspecified: Secondary | ICD-10-CM

## 2023-04-28 NOTE — Assessment & Plan Note (Signed)
Patient uses AndroGel pump (2 pumps daily applied to shoulders). This is known to contribute to polycythemia. -Continue AndroGel as currently prescribed, monitor for any worsening polycythemia.

## 2023-04-28 NOTE — Progress Notes (Signed)
[facility]  HEMATOLOGY CLINIC PROGRESS NOTE  PATIENT NAME: Jesus Brown   MR#: [id number] DOB: 1965/04/24  Patient Care Team: Tessa Lerner, DO as PCP - General (Cardiology)  Date of visit: 04/28/2023   ASSESSMENT & PLAN:   Jesus Brown is a 58 y.o. gentleman with past medical history of hypertension, arthritis, chronic back pain, hypogonadism, gout, was referred to our clinic in December 2024 for evaluation of elevated hemoglobin.  Workup consistent with secondary polycythemia from AndroGel use.  JAK2, CALR, MPL mutations were negative.  Secondary polycythemia History of elevated red blood cell count, previously managed with phlebotomies at the [facility]. Recent hemoglobin 19.5, hematocrit 57.4. Patient reports using AndroGel, which may contribute to elevated red cell count. Reports occasional severe headaches.  On his initial consultation with [place] on 03/25/2023, labs showed hemoglobin of 17.5, hematocrit 51.  White count, platelet count were within normal limits.  CMP unremarkable.  LDH, iron studies were within normal limits.  Erythropoietin level was normal at 11.1 mIU/mL.  JAK2, CALR, MPL mutations were all negative.    Clinical picture is consistent with secondary polycythemia from AndroGel use.  We will aim to keep hematocrit below 55 with therapeutic phlebotomies as needed, to reduce hyperviscosity related complications including blood clots.   Patient also regularly donates blood at [facility].  This will also help with his polycythemia.  Hypotestosteronemia Patient uses AndroGel pump (2 pumps daily applied to shoulders). This is known to contribute to polycythemia. -Continue AndroGel as currently prescribed, monitor for any worsening polycythemia.     I spent a total of 20 minutes during this encounter with the patient including review of chart and various tests results, discussions about plan of care and coordination of care plan.  I reviewed lab results  and outside records for this visit and discussed relevant results with the patient. Diagnosis, plan of care and treatment options were also discussed in detail with the patient. Opportunity provided to ask questions and answers provided to his apparent satisfaction. Provided instructions to call our clinic with any problems, questions or concerns prior to return visit. I recommended to continue follow-up with PCP and sub-specialists. He verbalized understanding and agreed with the plan. No barriers to learning was detected.  Meryl Crutch, MD  04/28/2023 1:05 PM  [facility] - A DEPT OF [facility] [address] [place] [address] Dept: [phone] Dept Fax: [phone]   CHIEF COMPLAINT/ REASON FOR VISIT:  Secondary polycythemia, from AndroGel use.  JAK2, CALR, MPL mutations negative.  INTERVAL HISTORY:  Discussed the use of AI scribe software for clinical note transcription with the patient, who gave verbal consent to proceed.   Patient returns for follow-up.  He recently experienced a gout flare, necessitating an emergency room visit. This was his third or fourth gout flare over a period of six years. He was prescribed a course of steroids for the flare and will be starting allopurinol and colchicine for gout management. The patient reports that his toe is improving, with decreasing swelling, though his foot still feels a bit tight.  Regarding his polycythemia, the patient reports that he can tell when his hematocrit is high because he starts getting headaches. He has been managing his polycythemia by donating blood every two to three months. He also uses AndroGel every morning.  SUMMARY OF HEMATOLOGIC HISTORY:  He presented to the ED on 02/22/2023 with complaints of chest pain.  Cardiac workup was grossly unremarkable.  Labs at that time revealed hemoglobin of 19.5, hematocrit 57.4, MCV 84.3.  White count and  platelet count were normal.  A referral was sent to [place] for further evaluation.   Patient was apparently diagnosed with polycythemia several years ago and was on therapeutic phlebotomies up until 2017.  He was lost to follow-up after that.   He reports previously donating blood every three to four months at the [facility], but recently his red cell count was too high for donation. He denies smoking and drinking. He previously used testosterone injections, which elevated his red cell count, and has since switched to a topical AndroGel pump, which he applies to his shoulders daily. He reports feeling generally well, but does experience severe headaches. He denies dizziness, but notes occasional chest tightness after receiving a cortisone shot in his shoulder. He also reports some itching, but no rashes. He sleeps well and has not been diagnosed with sleep apnea. He has seen a cardiologist in the past for a heart murmur, which was deemed not concerning. He has a long-term swelling in right leg, which has been evaluated multiple times with ultrasound for clot, with negative results.   Patient reports using AndroGel, which may contribute to elevated red cell count. Reports occasional severe headaches.   On his initial consultation with [place] on 03/25/2023, labs showed hemoglobin of 17.5, hematocrit 51.  White count, platelet count were within normal limits.  CMP unremarkable.  LDH, iron studies were within normal limits.  Erythropoietin level was normal at 11.1 mIU/mL.  JAK2, CALR, MPL mutations were all negative.    Clinical picture is consistent with secondary polycythemia from AndroGel use.  We will aim to keep hematocrit below 55 with therapeutic phlebotomies as needed, to reduce hyperviscosity related complications including blood clots.   I have reviewed the past medical history, past surgical history, social history and family history with the patient and they are unchanged from previous note.  ALLERGIES:  He is allergic to adhesive [tape], baclofen, and indocin [indomethacin].  MEDICATIONS:  Current Outpatient Medications  Medication Sig Dispense Refill   ANDROGEL PUMP 20.25 MG/ACT (1.62%) GEL   1   loratadine (CLARITIN) 10 MG tablet Take 10 mg by mouth daily.     Morphine Sulfate Beads 45 MG CP24 Take 1 capsule by mouth daily.     omeprazole (PRILOSEC) 20 MG capsule Take 20 mg by mouth daily.      valsartan (DIOVAN) 160 MG tablet Take 160 mg by mouth daily.   0   EPINEPHrine (EPIPEN 2-PAK) 0.3 mg/0.3 mL IJ SOAJ injection Inject 0.3 mg into the muscle as needed (allergic reaction). (Patient not taking: Reported on 04/28/2023)     PRESCRIPTION MEDICATION Inject 2 Doses as directed once a week. Allergy shots (Patient not taking: Reported on 04/28/2023)     No current facility-administered medications for this visit.     REVIEW OF SYSTEMS:    ROS  All other pertinent systems were reviewed with the patient and are negative.  PHYSICAL EXAMINATION:  ECOG PERFORMANCE STATUS: 1 - Symptomatic but completely ambulatory  Vitals:   04/28/23 1152 04/28/23 1153  BP: (!) 148/101 (!) 137/94  Pulse: 91   Resp: 18   Temp: 98.8 F (37.1 C)   SpO2: 97%    Filed Weights   04/28/23 1152  Weight: 253 lb 6.4 oz (114.9 kg)    Physical Exam Constitutional:      General: He is not in acute  distress.    Appearance: Normal appearance.  HENT:     Head: Normocephalic and atraumatic.  Eyes:     General: No scleral icterus.    Conjunctiva/sclera: Conjunctivae normal.  Cardiovascular:     Rate and Rhythm: Normal rate and regular rhythm.     Heart sounds: Normal heart sounds.  Pulmonary:     Effort: Pulmonary effort is normal.     Breath sounds: Normal breath sounds.  Abdominal:     General: There is no distension.  Musculoskeletal:     Right lower leg: No edema.     Left lower leg: No edema.  Neurological:     General: No focal deficit present.     Mental Status: He is alert and oriented to  person, place, and time.  Psychiatric:        Mood and Affect: Mood normal.        Behavior: Behavior normal.        Thought Content: Thought content normal.     LABORATORY DATA:   I have reviewed the data as listed.  No results found for any visits on 04/28/23.  Lab Results  Component Value Date   WBC 10.1 04/21/2023   NEUTROABS 6.5 04/21/2023   HGB 17.0 04/21/2023   HCT 49.6 04/21/2023   MCV 86.0 04/21/2023   PLT 198 04/21/2023    RADIOGRAPHIC STUDIES:  No recent pertinent imaging studies available to review.  Orders Placed This Encounter  Procedures   CBC with Differential/Platelet    Standing Status:   Future    Expected Date:   10/26/2023    Expiration Date:   04/27/2024     Future Appointments  Date Time Provider Department Center  06/02/2023 10:40 AM Jodelle Red, MD [facility]  10/27/2023 11:15 AM [facility] PHLEBOTOMIST [facility] None  10/27/2023 11:40 AM Yvonda Fouty, Archie Patten, MD [facility] None     This document was completed utilizing speech recognition software. Grammatical errors, random word insertions, pronoun errors, and incomplete sentences are an occasional consequence of this system due to software limitations, ambient noise, and hardware issues. Any formal questions or concerns about the content, text or information contained within the body of this dictation should be directly addressed to the provider for clarification.

## 2023-04-28 NOTE — Assessment & Plan Note (Signed)
History of elevated red blood cell count, previously managed with phlebotomies at the [facility]. Recent hemoglobin 19.5, hematocrit 57.4. Patient reports using AndroGel, which may contribute to elevated red cell count. Reports occasional severe headaches.  On his initial consultation with [place] on 03/25/2023, labs showed hemoglobin of 17.5, hematocrit 51.  White count, platelet count were within normal limits.  CMP unremarkable.  LDH, iron studies were within normal limits.  Erythropoietin level was normal at 11.1 mIU/mL.  JAK2, CALR, MPL mutations were all negative.    Clinical picture is consistent with secondary polycythemia from AndroGel use.  We will aim to keep hematocrit below 55 with therapeutic phlebotomies as needed, to reduce hyperviscosity related complications including blood clots.   Patient also regularly donates blood at [facility].  This will also help with his polycythemia.

## 2023-05-07 DIAGNOSIS — G894 Chronic pain syndrome: Secondary | ICD-10-CM | POA: Diagnosis not present

## 2023-05-07 DIAGNOSIS — F419 Anxiety disorder, unspecified: Secondary | ICD-10-CM | POA: Diagnosis not present

## 2023-05-07 DIAGNOSIS — M62830 Muscle spasm of back: Secondary | ICD-10-CM | POA: Diagnosis not present

## 2023-05-07 DIAGNOSIS — M47812 Spondylosis without myelopathy or radiculopathy, cervical region: Secondary | ICD-10-CM | POA: Diagnosis not present

## 2023-05-13 ENCOUNTER — Encounter: Payer: Self-pay | Admitting: Oncology

## 2023-06-02 ENCOUNTER — Encounter (HOSPITAL_BASED_OUTPATIENT_CLINIC_OR_DEPARTMENT_OTHER): Payer: Self-pay | Admitting: Cardiology

## 2023-06-02 ENCOUNTER — Ambulatory Visit (HOSPITAL_BASED_OUTPATIENT_CLINIC_OR_DEPARTMENT_OTHER): Payer: PPO | Admitting: Cardiology

## 2023-06-02 VITALS — BP 122/76 | HR 105 | Ht 68.0 in | Wt 254.0 lb

## 2023-06-02 DIAGNOSIS — E66812 Obesity, class 2: Secondary | ICD-10-CM | POA: Diagnosis not present

## 2023-06-02 DIAGNOSIS — E6609 Other obesity due to excess calories: Secondary | ICD-10-CM

## 2023-06-02 DIAGNOSIS — Z6836 Body mass index (BMI) 36.0-36.9, adult: Secondary | ICD-10-CM

## 2023-06-02 DIAGNOSIS — R079 Chest pain, unspecified: Secondary | ICD-10-CM

## 2023-06-02 DIAGNOSIS — R011 Cardiac murmur, unspecified: Secondary | ICD-10-CM

## 2023-06-02 DIAGNOSIS — I10 Essential (primary) hypertension: Secondary | ICD-10-CM

## 2023-06-02 DIAGNOSIS — D751 Secondary polycythemia: Secondary | ICD-10-CM

## 2023-06-02 NOTE — Progress Notes (Signed)
 Cardiology Office Note:  .   Date:  06/02/2023  ID:  ZYMIER RODGERS, DOB [birth year], MRN [id number] PCP: Georgianne Fick, MD  [facility] Providers Cardiologist:  Jodelle Red, MD {  History of Present Illness: .   Jesus Brown is a 58 y.o. male with PMH hypertension, murmur, obesity. He was previously seen by [facility], last visit 07/30/22.He is now referred by the [facility] for chest pain.  Pertinent CV history: Note per [facility] stated echo showed EF 60-65%, G1DD, trace MR, mild TR. I cannot see actual images.   Today: Has a history of secondary polycythemia, followed by heme/onc. When his blood counts are high, he feels headaches, high blood pressure, and chest pressure. Since his most recent phlebotomy, he has had no symptoms. He is planning for phlebotomy every 3 months as this makes him feel better.  Has had 22 surgeries, has a lot of joint pain. Notes that when he gets steroids his symptoms are severe, so he tries to make sure he's had phlebotomy before.  During the events, he feels like his chest is being squeezed. Happens at rest. Can be brief or can last 5-10 minutes. At the same time, his head is pounding, feels dizzy. Not short of breath. Goody powder can help his symptoms sometimes. Sometimes running his neck under hot water helps. Feels tired, falls asleep easily after these events. No symptoms when he exerts himself.  Family history: father had MI in his early 25s, he is now living at age 32. Has early dementia. Mother has Alzheimer's, no heart issues. Sister has hypertension, leg swelling, tachycardia.  Prior ECG: sinus tach  He has chronic LE edema R sided due to recurrent cellulitis.  We discussed options for further evaluation, see below.  ROS: Denies shortness of breath at rest or with normal exertion. No PND, orthopnea, or unexpected weight gain. No syncope or palpitations. ROS otherwise negative except as noted.   Studies Reviewed:  Marland Kitchen    EKG:       Physical Exam:   VS:  BP 122/76   Pulse (!) 105   Ht 5\' 8"  (1.727 m)   Wt 254 lb (115.2 kg)   SpO2 98%   BMI 38.62 kg/m    Wt Readings from Last 3 Encounters:  06/02/23 254 lb (115.2 kg)  04/28/23 253 lb 6.4 oz (114.9 kg)  03/25/23 252 lb 4.8 oz (114.4 kg)    GEN: Well nourished, well developed in no acute distress HEENT: Normal, moist mucous membranes NECK: No JVD CARDIAC: regular rhythm, normal S1 and S2, no rubs or gallops. 1/6 systolic murmur. VASCULAR: Radial and DP pulses 2+ bilaterally. No carotid bruits RESPIRATORY:  Clear to auscultation without rales, wheezing or rhonchi  ABDOMEN: Soft, non-tender, non-distended MUSCULOSKELETAL:  Ambulates independently SKIN: Warm and dry, no edema NEUROLOGIC:  Alert and oriented x 3. No focal neuro deficits noted. PSYCHIATRIC:  Normal affect    ASSESSMENT AND PLAN: .    Chest pain -reviewed options for further evaluation today. After shared decision making, will proceed with exercise treadmill stress test -reviewed red flag warning signs that need immediate medical attention  Informed Consent   Shared Decision Making/Informed Consent The risks [chest pain, shortness of breath, cardiac arrhythmias, dizziness, blood pressure fluctuations, myocardial infarction, stroke/transient ischemic attack, and life-threatening complications (estimated to be 1 in 10,000)], benefits (risk stratification, diagnosing coronary artery disease, treatment guidance) and alternatives of an exercise tolerance test were discussed in detail with Mr. Rideaux and  he agrees to proceed.      Hypertension -at goal -continue valsartan 160 mg daily  Obesity, BMI 38 -if stress test unremarkable, encouraged lifestyle change  Murmur -soft, no high risk findings on prior echo  CV risk counseling and prevention -recommend heart healthy/Mediterranean diet, with whole grains, fruits, vegetable, fish, lean meats, nuts, and olive oil. Limit  salt. -recommend moderate walking, 3-5 times/week for 30-50 minutes each session. Aim for at least 150 minutes.week. Goal should be pace of 3 miles/hours, or walking 1.5 miles in 30 minutes -recommend avoidance of tobacco products. Avoid excess alcohol. -ASCVD risk score: The 10-year ASCVD risk score (Arnett DK, et al., 2019) is: 9.5%   Values used to calculate the score:     Age: 41 years     Sex: Male     Is Non-Hispanic African American: No     Diabetic: No     Tobacco smoker: No     Systolic Blood Pressure: 122 mmHg     Is BP treated: Yes     HDL Cholesterol: 36 mg/dL     Total Cholesterol: 174 mg/dL    Dispo: to be determined based on results of stress test. If unremarkable, can follow up as needed  Signed, Jodelle Red, MD   Jodelle Red, MD, PhD, [facility]  [facility]  [place]  [facility] at [facility] at [facility] [address] [place], [place] [address] [phone]

## 2023-06-02 NOTE — Patient Instructions (Signed)
 Medication Instructions:  Your physician recommends that you continue on your current medications as directed. Please refer to the Current Medication list given to you today.    Testing/Procedures: Your physician requests you have a treadmill stress test.   Follow-Up: At [facility], you and your health needs are our priority.  As part of our continuing mission to provide you with exceptional heart care, we have created designated Provider Care Teams.  These Care Teams include your primary Cardiologist (physician) and Advanced Practice Providers (APPs -  Physician Assistants and Nurse Practitioners) who all work together to provide you with the care you need, when you need it.  We recommend signing up for the patient portal called "MyChart".  Sign up information is provided on this After Visit Summary.  MyChart is used to connect with patients for Virtual Visits (Telemedicine).  Patients are able to view lab/test results, encounter notes, upcoming appointments, etc.  Non-urgent messages can be sent to your provider as well.   To learn more about what you can do with MyChart, go to [email or web address].    Your next appointment:   To be determined on stress test results  Provider:   Jodelle Red, MD

## 2023-06-04 DIAGNOSIS — M62830 Muscle spasm of back: Secondary | ICD-10-CM | POA: Diagnosis not present

## 2023-06-04 DIAGNOSIS — M47812 Spondylosis without myelopathy or radiculopathy, cervical region: Secondary | ICD-10-CM | POA: Diagnosis not present

## 2023-06-04 DIAGNOSIS — G894 Chronic pain syndrome: Secondary | ICD-10-CM | POA: Diagnosis not present

## 2023-06-04 DIAGNOSIS — F419 Anxiety disorder, unspecified: Secondary | ICD-10-CM | POA: Diagnosis not present

## 2023-06-11 ENCOUNTER — Ambulatory Visit: Payer: No Typology Code available for payment source

## 2023-06-24 ENCOUNTER — Ambulatory Visit: Attending: Cardiology

## 2023-06-24 DIAGNOSIS — I10 Essential (primary) hypertension: Secondary | ICD-10-CM | POA: Diagnosis not present

## 2023-06-24 DIAGNOSIS — D751 Secondary polycythemia: Secondary | ICD-10-CM | POA: Diagnosis not present

## 2023-06-24 DIAGNOSIS — Z0000 Encounter for general adult medical examination without abnormal findings: Secondary | ICD-10-CM | POA: Diagnosis not present

## 2023-06-24 DIAGNOSIS — R079 Chest pain, unspecified: Secondary | ICD-10-CM

## 2023-06-24 DIAGNOSIS — E291 Testicular hypofunction: Secondary | ICD-10-CM | POA: Diagnosis not present

## 2023-06-24 DIAGNOSIS — E669 Obesity, unspecified: Secondary | ICD-10-CM | POA: Diagnosis not present

## 2023-06-24 DIAGNOSIS — K219 Gastro-esophageal reflux disease without esophagitis: Secondary | ICD-10-CM | POA: Diagnosis not present

## 2023-06-25 LAB — EXERCISE TOLERANCE TEST
Angina Index: 0
Base ST Depression (mm): 0 mm
Duke Treadmill Score: 6
Estimated workload: 7
Exercise duration (min): 5 min
Exercise duration (sec): 35 s
MPHR: 161 {beats}/min
Peak HR: 144 {beats}/min
Percent HR: 89 %
RPE: 15
Rest HR: 104 {beats}/min
ST Depression (mm): 0 mm

## 2023-07-03 DIAGNOSIS — G894 Chronic pain syndrome: Secondary | ICD-10-CM | POA: Diagnosis not present

## 2023-07-03 DIAGNOSIS — M62830 Muscle spasm of back: Secondary | ICD-10-CM | POA: Diagnosis not present

## 2023-07-03 DIAGNOSIS — M47812 Spondylosis without myelopathy or radiculopathy, cervical region: Secondary | ICD-10-CM | POA: Diagnosis not present

## 2023-07-03 DIAGNOSIS — F419 Anxiety disorder, unspecified: Secondary | ICD-10-CM | POA: Diagnosis not present

## 2023-07-28 DIAGNOSIS — Z5320 Procedure and treatment not carried out because of patient's decision for unspecified reasons: Secondary | ICD-10-CM | POA: Diagnosis not present

## 2023-07-28 DIAGNOSIS — M150 Primary generalized (osteo)arthritis: Secondary | ICD-10-CM | POA: Diagnosis not present

## 2023-07-28 DIAGNOSIS — E669 Obesity, unspecified: Secondary | ICD-10-CM | POA: Diagnosis not present

## 2023-07-28 DIAGNOSIS — E7841 Elevated Lipoprotein(a): Secondary | ICD-10-CM | POA: Diagnosis not present

## 2023-07-28 DIAGNOSIS — E782 Mixed hyperlipidemia: Secondary | ICD-10-CM | POA: Diagnosis not present

## 2023-07-28 DIAGNOSIS — D751 Secondary polycythemia: Secondary | ICD-10-CM | POA: Diagnosis not present

## 2023-07-28 DIAGNOSIS — K219 Gastro-esophageal reflux disease without esophagitis: Secondary | ICD-10-CM | POA: Diagnosis not present

## 2023-07-28 DIAGNOSIS — R7301 Impaired fasting glucose: Secondary | ICD-10-CM | POA: Diagnosis not present

## 2023-07-28 DIAGNOSIS — E291 Testicular hypofunction: Secondary | ICD-10-CM | POA: Diagnosis not present

## 2023-07-28 DIAGNOSIS — I10 Essential (primary) hypertension: Secondary | ICD-10-CM | POA: Diagnosis not present

## 2023-07-28 DIAGNOSIS — Z0000 Encounter for general adult medical examination without abnormal findings: Secondary | ICD-10-CM | POA: Diagnosis not present

## 2023-07-29 DIAGNOSIS — M62830 Muscle spasm of back: Secondary | ICD-10-CM | POA: Diagnosis not present

## 2023-07-29 DIAGNOSIS — G894 Chronic pain syndrome: Secondary | ICD-10-CM | POA: Diagnosis not present

## 2023-07-29 DIAGNOSIS — F419 Anxiety disorder, unspecified: Secondary | ICD-10-CM | POA: Diagnosis not present

## 2023-07-29 DIAGNOSIS — M47812 Spondylosis without myelopathy or radiculopathy, cervical region: Secondary | ICD-10-CM | POA: Diagnosis not present

## 2023-07-30 ENCOUNTER — Other Ambulatory Visit: Payer: Self-pay | Admitting: Physical Medicine and Rehabilitation

## 2023-07-30 DIAGNOSIS — M5416 Radiculopathy, lumbar region: Secondary | ICD-10-CM

## 2023-08-07 ENCOUNTER — Encounter (HOSPITAL_BASED_OUTPATIENT_CLINIC_OR_DEPARTMENT_OTHER): Payer: Self-pay

## 2023-08-27 DIAGNOSIS — G894 Chronic pain syndrome: Secondary | ICD-10-CM | POA: Diagnosis not present

## 2023-08-27 DIAGNOSIS — F419 Anxiety disorder, unspecified: Secondary | ICD-10-CM | POA: Diagnosis not present

## 2023-08-27 DIAGNOSIS — M62830 Muscle spasm of back: Secondary | ICD-10-CM | POA: Diagnosis not present

## 2023-08-27 DIAGNOSIS — M47812 Spondylosis without myelopathy or radiculopathy, cervical region: Secondary | ICD-10-CM | POA: Diagnosis not present

## 2023-09-06 ENCOUNTER — Other Ambulatory Visit

## 2023-09-09 ENCOUNTER — Telehealth: Payer: Self-pay | Admitting: Nurse Practitioner

## 2023-09-09 NOTE — Telephone Encounter (Signed)
 Patient has been scheduled for follow-up visit per 09/08/23 LOS.  LVM notifying pt of appt details, provided my direct number to pt if appt changes need to be made.

## 2023-09-13 ENCOUNTER — Ambulatory Visit
Admission: RE | Admit: 2023-09-13 | Discharge: 2023-09-13 | Disposition: A | Discharge status: Home / Self Care | Source: Ambulatory Visit | Attending: Physical Medicine and Rehabilitation | Admitting: Physical Medicine and Rehabilitation

## 2023-09-13 DIAGNOSIS — M5126 Other intervertebral disc displacement, lumbar region: Secondary | ICD-10-CM | POA: Diagnosis not present

## 2023-09-13 DIAGNOSIS — M4726 Other spondylosis with radiculopathy, lumbar region: Secondary | ICD-10-CM | POA: Diagnosis not present

## 2023-09-13 DIAGNOSIS — Z981 Arthrodesis status: Secondary | ICD-10-CM | POA: Diagnosis not present

## 2023-09-13 DIAGNOSIS — M5416 Radiculopathy, lumbar region: Secondary | ICD-10-CM

## 2023-09-13 MED ORDER — GADOPICLENOL 0.5 MMOL/ML IV SOLN
10.0000 mL | Freq: Once | INTRAVENOUS | Status: AC | PRN
  Administered 2023-09-13: 10 mL via INTRAVENOUS

## 2023-09-24 DIAGNOSIS — M47812 Spondylosis without myelopathy or radiculopathy, cervical region: Secondary | ICD-10-CM | POA: Diagnosis not present

## 2023-09-24 DIAGNOSIS — F419 Anxiety disorder, unspecified: Secondary | ICD-10-CM | POA: Diagnosis not present

## 2023-09-24 DIAGNOSIS — G894 Chronic pain syndrome: Secondary | ICD-10-CM | POA: Diagnosis not present

## 2023-09-24 DIAGNOSIS — M62830 Muscle spasm of back: Secondary | ICD-10-CM | POA: Diagnosis not present

## 2023-09-30 ENCOUNTER — Other Ambulatory Visit (HOSPITAL_COMMUNITY): Payer: Self-pay

## 2023-10-22 DIAGNOSIS — M47812 Spondylosis without myelopathy or radiculopathy, cervical region: Secondary | ICD-10-CM | POA: Diagnosis not present

## 2023-10-22 DIAGNOSIS — G894 Chronic pain syndrome: Secondary | ICD-10-CM | POA: Diagnosis not present

## 2023-10-22 DIAGNOSIS — F419 Anxiety disorder, unspecified: Secondary | ICD-10-CM | POA: Diagnosis not present

## 2023-10-22 DIAGNOSIS — M62830 Muscle spasm of back: Secondary | ICD-10-CM | POA: Diagnosis not present

## 2023-10-27 ENCOUNTER — Other Ambulatory Visit: Payer: No Typology Code available for payment source

## 2023-10-27 ENCOUNTER — Ambulatory Visit: Payer: No Typology Code available for payment source | Admitting: Oncology

## 2023-10-29 ENCOUNTER — Inpatient Hospital Stay: Attending: Nurse Practitioner

## 2023-10-29 ENCOUNTER — Ambulatory Visit: Admitting: Nurse Practitioner

## 2023-10-29 NOTE — Progress Notes (Deleted)
 [facility]   [phone] [phone]    Patient Care Team: Verdia Lombard, MD as PCP - General (Internal Medicine) Lonni Slain, MD as PCP - Cardiology (Cardiology)   CHIEF COMPLAINT: Follow-up secondary polycythemia  Oncology History   No history exists.     CURRENT THERAPY: Blood donation/phlebotomy as needed, goal HCT < 55%  INTERVAL HISTORY Mr. Sprinkle returns for follow-up as scheduled, last seen by Dr. Autumn 04/28/2023  ROS   Past Medical History:  Diagnosis Date   Anxiety    mainly r/t pain   Arthritis    neck, knees   Asthma    AS CHILD    Back pain, chronic    DDD (degenerative disc disease)    Dysphagia    Erythrocytosis due to endocrine disorders 09/08/2015   GERD (gastroesophageal reflux disease)    Hypertension    Hypotestosteronemia 09/08/2015   Neck pain, chronic      Past Surgical History:  Procedure Laterality Date   ANKLE SURGERY     BACK SURGERY  2010,2011   lumb fusionx2   CERVICAL FUSION  [id number]   3 surgeries   DIRECT LARYNGOSCOPY N/A 11/09/2012   Procedure: DIRECT LARYNGOSCOPY;  Surgeon: Marlyce Finer, MD;  Location: [facility];  Service: ENT;  Laterality: N/A;   ESOPHAGOGASTRODUODENOSCOPY N/A 08/03/2012   Procedure: ESOPHAGOGASTRODUODENOSCOPY (EGD);  Surgeon: Elsie Cree, MD;  Location: [facility] END[facility];  Service: Endoscopy;  Laterality: N/A;   ESOPHAGOSCOPY W/ BOTOX  INJECTION N/A 11/09/2012   Procedure: ESOPHAGOSCOPY POSSIBLE BIOPSY AND POSSIBLE BOTOX  INJECTION CRICOPHARYNGEUS;  Surgeon: Marlyce Finer, MD;  Location: [facility];  Service: ENT;  Laterality: N/A;  Esophagoscopy with botox  injection   HARDWARE REMOVAL Right 12/19/2017   Procedure: REMOVAL OF SMALL FRAGMENT PLATE AND SCREWS RIGHT LATERAL ANKLE;  Surgeon: Lucilla Lynwood BRAVO, MD;  Location:  SURGERY CENTER;  Service: Orthopedics;  Laterality: Right;   HERNIA REPAIR     ing-rt   KNEE SURGERY      rt and left x2 each=4   POSTERIOR CERVICAL FUSION/FORAMINOTOMY N/A 03/25/2014   Procedure: LEFT C7-T1 FORAMINOTOMY;  Surgeon: Lynwood BRAVO Lucilla, MD;  Location: [facility] OR;  Service: Orthopedics;  Laterality: N/A;   SHOULDER SURGERY  1999   rt and lt   TONSILLECTOMY     TOTAL KNEE ARTHROPLASTY Left 07/19/2016   Procedure: LEFT TOTAL KNEE ARTHROPLASTY CORTISON INJECTION IN RIGHT KNEE;  Surgeon: Lynwood BRAVO Lucilla, MD;  Location: [facility] OR;  Service: Orthopedics;  Laterality: Left;  MAY NEED RNFA TO FINISH CASE PER SHERRIE    TOTAL KNEE ARTHROPLASTY Right 04/11/2017   Procedure: RIGHT TOTAL KNEE ARTHROPLASTY;  Surgeon: Lucilla Lynwood BRAVO, MD;  Location: [facility] OR;  Service: Orthopedics;  Laterality: Right;   WRIST SURGERY     lt and rt     Outpatient Encounter Medications as of 10/29/2023  Medication Sig Note   allopurinol  (ZYLOPRIM ) 100 MG tablet Take 100 mg by mouth daily.    ANDROGEL  PUMP 20.25 MG/ACT (1.62%) GEL     EPINEPHrine  (EPIPEN  2-PAK) 0.3 mg/0.3 mL IJ SOAJ injection Inject 0.3 mg into the muscle as needed (allergic reaction). (Patient not taking: Reported on 04/28/2023) 12/12/2017: Pt has never needed   loratadine  (CLARITIN ) 10 MG tablet Take 10 mg by mouth daily.    Morphine  Sulfate Beads 45 MG CP24 Take 1 capsule by mouth daily.    omeprazole (PRILOSEC) 20 MG capsule Take 20 mg by mouth daily.  PRESCRIPTION MEDICATION Inject 2 Doses as directed once a week. Allergy shots    valsartan (DIOVAN) 160 MG tablet Take 160 mg by mouth daily.     No facility-administered encounter medications on file as of 10/29/2023.     There were no vitals filed for this visit. There is no height or weight on file to calculate BMI.   ECOG PERFORMANCE STATUS: {CHL ONC ECOG [phone]}  PHYSICAL EXAM GENERAL:alert, no distress and comfortable SKIN: no rash  EYES: sclera clear NECK: without mass LYMPH:  no palpable cervical or supraclavicular lymphadenopathy  LUNGS: clear with normal breathing effort HEART: regular  rate & rhythm, no lower extremity edema ABDOMEN: abdomen soft, non-tender and normal bowel sounds NEURO: alert & oriented x 3 with fluent speech, no focal motor/sensory deficits Breast exam:  PAC without erythema    CBC    Latest Ref Rng & Units 04/21/2023   10:18 AM 03/25/2023    1:35 PM 02/22/2023    9:51 AM  CBC  WBC 4.0 - 10.5 K/uL 10.1  9.9  8.2   Hemoglobin 13.0 - 17.0 g/dL 82.9  82.4  80.4   Hematocrit 39.0 - 52.0 % 49.6  51.0  57.4   Platelets 150 - 400 K/uL 198  213  215       CMP     Latest Ref Rng & Units 03/25/2023    1:35 PM 02/22/2023    9:51 AM 03/13/2020    5:15 PM  CMP  Glucose 70 - 99 mg/dL 883  867  899   BUN 6 - 20 mg/dL 18  21  17    Creatinine 0.61 - 1.24 mg/dL 9.00  9.12  9.06   Sodium 135 - 145 mmol/L 140  137  137   Potassium 3.5 - 5.1 mmol/L 3.8  3.9  3.7   Chloride 98 - 111 mmol/L 105  104  103   CO2 22 - 32 mmol/L 27  26  22    Calcium  8.9 - 10.3 mg/dL 9.0  9.0  9.2   Total Protein 6.5 - 8.1 g/dL 6.8   6.8   Total Bilirubin <1.2 mg/dL 0.7   0.6   Alkaline Phos 38 - 126 U/L 69   75   AST 15 - 41 U/L 26   36   ALT 0 - 44 U/L 37   40       ASSESSMENT & PLAN:  PLAN:  No orders of the defined types were placed in this encounter.     All questions were answered. The patient knows to call the clinic with any problems, questions or concerns. No barriers to learning were detected. I spent *** counseling the patient face to face. The total time spent in the appointment was *** and more than 50% was on counseling, review of test results, and coordination of care.   Ariadna Setter K Gaylin Osoria, NP 10/29/2023 6:16 AM

## 2023-11-12 ENCOUNTER — Other Ambulatory Visit (HOSPITAL_BASED_OUTPATIENT_CLINIC_OR_DEPARTMENT_OTHER): Payer: Self-pay

## 2023-11-19 DIAGNOSIS — M47812 Spondylosis without myelopathy or radiculopathy, cervical region: Secondary | ICD-10-CM | POA: Diagnosis not present

## 2023-11-19 DIAGNOSIS — G894 Chronic pain syndrome: Secondary | ICD-10-CM | POA: Diagnosis not present

## 2023-11-19 DIAGNOSIS — M62830 Muscle spasm of back: Secondary | ICD-10-CM | POA: Diagnosis not present

## 2023-11-19 DIAGNOSIS — F419 Anxiety disorder, unspecified: Secondary | ICD-10-CM | POA: Diagnosis not present

## 2023-12-17 DIAGNOSIS — M62830 Muscle spasm of back: Secondary | ICD-10-CM | POA: Diagnosis not present

## 2023-12-17 DIAGNOSIS — F419 Anxiety disorder, unspecified: Secondary | ICD-10-CM | POA: Diagnosis not present

## 2023-12-17 DIAGNOSIS — G894 Chronic pain syndrome: Secondary | ICD-10-CM | POA: Diagnosis not present

## 2023-12-17 DIAGNOSIS — M47812 Spondylosis without myelopathy or radiculopathy, cervical region: Secondary | ICD-10-CM | POA: Diagnosis not present

## 2023-12-23 DIAGNOSIS — K644 Residual hemorrhoidal skin tags: Secondary | ICD-10-CM | POA: Diagnosis not present

## 2024-02-02 DIAGNOSIS — E291 Testicular hypofunction: Secondary | ICD-10-CM | POA: Diagnosis not present

## 2024-02-02 DIAGNOSIS — R7301 Impaired fasting glucose: Secondary | ICD-10-CM | POA: Diagnosis not present

## 2024-02-02 DIAGNOSIS — D751 Secondary polycythemia: Secondary | ICD-10-CM | POA: Diagnosis not present

## 2024-02-02 DIAGNOSIS — I10 Essential (primary) hypertension: Secondary | ICD-10-CM | POA: Diagnosis not present

## 2024-02-02 DIAGNOSIS — K219 Gastro-esophageal reflux disease without esophagitis: Secondary | ICD-10-CM | POA: Diagnosis not present

## 2024-02-02 DIAGNOSIS — E669 Obesity, unspecified: Secondary | ICD-10-CM | POA: Diagnosis not present

## 2024-02-09 DIAGNOSIS — D751 Secondary polycythemia: Secondary | ICD-10-CM | POA: Diagnosis not present

## 2024-02-09 DIAGNOSIS — E7841 Elevated Lipoprotein(a): Secondary | ICD-10-CM | POA: Diagnosis not present

## 2024-02-09 DIAGNOSIS — E291 Testicular hypofunction: Secondary | ICD-10-CM | POA: Diagnosis not present

## 2024-02-09 DIAGNOSIS — K219 Gastro-esophageal reflux disease without esophagitis: Secondary | ICD-10-CM | POA: Diagnosis not present

## 2024-02-09 DIAGNOSIS — E669 Obesity, unspecified: Secondary | ICD-10-CM | POA: Diagnosis not present

## 2024-02-09 DIAGNOSIS — R7303 Prediabetes: Secondary | ICD-10-CM | POA: Diagnosis not present

## 2024-02-09 DIAGNOSIS — Z5320 Procedure and treatment not carried out because of patient's decision for unspecified reasons: Secondary | ICD-10-CM | POA: Diagnosis not present

## 2024-02-09 DIAGNOSIS — M150 Primary generalized (osteo)arthritis: Secondary | ICD-10-CM | POA: Diagnosis not present

## 2024-02-09 DIAGNOSIS — E782 Mixed hyperlipidemia: Secondary | ICD-10-CM | POA: Diagnosis not present

## 2024-02-09 DIAGNOSIS — I10 Essential (primary) hypertension: Secondary | ICD-10-CM | POA: Diagnosis not present

## 2024-02-12 DIAGNOSIS — F419 Anxiety disorder, unspecified: Secondary | ICD-10-CM | POA: Diagnosis not present

## 2024-02-12 DIAGNOSIS — M47812 Spondylosis without myelopathy or radiculopathy, cervical region: Secondary | ICD-10-CM | POA: Diagnosis not present

## 2024-02-12 DIAGNOSIS — G894 Chronic pain syndrome: Secondary | ICD-10-CM | POA: Diagnosis not present

## 2024-02-12 DIAGNOSIS — M62830 Muscle spasm of back: Secondary | ICD-10-CM | POA: Diagnosis not present

## 2024-03-10 DIAGNOSIS — G894 Chronic pain syndrome: Secondary | ICD-10-CM | POA: Diagnosis not present

## 2024-03-10 DIAGNOSIS — M62830 Muscle spasm of back: Secondary | ICD-10-CM | POA: Diagnosis not present

## 2024-03-10 DIAGNOSIS — F419 Anxiety disorder, unspecified: Secondary | ICD-10-CM | POA: Diagnosis not present

## 2024-03-10 DIAGNOSIS — M47812 Spondylosis without myelopathy or radiculopathy, cervical region: Secondary | ICD-10-CM | POA: Diagnosis not present
